# Patient Record
Sex: Male | Born: 1975 | Race: White | Hispanic: No | Marital: Married
Health system: Southern US, Community
[De-identification: ages and names within clinical notes are randomized; demographics above are authoritative.]

## PROBLEM LIST (undated history)

## (undated) DIAGNOSIS — T8859XA Other complications of anesthesia, initial encounter: Secondary | ICD-10-CM

## (undated) DIAGNOSIS — K56609 Unspecified intestinal obstruction, unspecified as to partial versus complete obstruction: Secondary | ICD-10-CM

## (undated) DIAGNOSIS — B2790 Infectious mononucleosis, unspecified without complication: Secondary | ICD-10-CM

## (undated) DIAGNOSIS — F1290 Cannabis use, unspecified, uncomplicated: Secondary | ICD-10-CM

## (undated) DIAGNOSIS — C8370 Burkitt lymphoma, unspecified site: Secondary | ICD-10-CM

## (undated) DIAGNOSIS — T4145XA Adverse effect of unspecified anesthetic, initial encounter: Secondary | ICD-10-CM

## (undated) HISTORY — PX: APPENDECTOMY: SHX54

## (undated) HISTORY — PX: TONSILLECTOMY: SUR1361

---

## 2011-11-11 DIAGNOSIS — B2790 Infectious mononucleosis, unspecified without complication: Secondary | ICD-10-CM

## 2011-11-11 HISTORY — DX: Infectious mononucleosis, unspecified without complication: B27.90

## 2012-04-20 ENCOUNTER — Ambulatory Visit (INDEPENDENT_AMBULATORY_CARE_PROVIDER_SITE_OTHER): Payer: Commercial Managed Care - PPO | Admitting: Internal Medicine

## 2012-04-20 ENCOUNTER — Encounter: Payer: Self-pay | Admitting: Internal Medicine

## 2012-04-20 VITALS — BP 126/74 | HR 60 | Temp 98.4°F | Ht 71.0 in | Wt 176.0 lb

## 2012-04-20 DIAGNOSIS — K5289 Other specified noninfective gastroenteritis and colitis: Secondary | ICD-10-CM

## 2012-04-20 DIAGNOSIS — R599 Enlarged lymph nodes, unspecified: Secondary | ICD-10-CM

## 2012-04-20 DIAGNOSIS — Z202 Contact with and (suspected) exposure to infections with a predominantly sexual mode of transmission: Secondary | ICD-10-CM

## 2012-04-20 DIAGNOSIS — R591 Generalized enlarged lymph nodes: Secondary | ICD-10-CM

## 2012-04-20 DIAGNOSIS — R21 Rash and other nonspecific skin eruption: Secondary | ICD-10-CM

## 2012-04-20 DIAGNOSIS — K529 Noninfective gastroenteritis and colitis, unspecified: Secondary | ICD-10-CM | POA: Insufficient documentation

## 2012-04-20 DIAGNOSIS — Z0000 Encounter for general adult medical examination without abnormal findings: Secondary | ICD-10-CM

## 2012-04-20 DIAGNOSIS — B09 Unspecified viral infection characterized by skin and mucous membrane lesions: Secondary | ICD-10-CM | POA: Insufficient documentation

## 2012-04-20 DIAGNOSIS — R109 Unspecified abdominal pain: Secondary | ICD-10-CM

## 2012-04-20 DIAGNOSIS — Z2089 Contact with and (suspected) exposure to other communicable diseases: Secondary | ICD-10-CM

## 2012-04-20 LAB — LIPID PANEL
LDL Cholesterol: 43 mg/dL (ref 0–99)
Total CHOL/HDL Ratio: 2
VLDL: 12.8 mg/dL (ref 0.0–40.0)

## 2012-04-20 LAB — CBC WITH DIFFERENTIAL/PLATELET
Basophils Absolute: 0 10*3/uL (ref 0.0–0.1)
Eosinophils Relative: 2.4 % (ref 0.0–5.0)
HCT: 41.8 % (ref 39.0–52.0)
Hemoglobin: 13.6 g/dL (ref 13.0–17.0)
Lymphocytes Relative: 33.8 % (ref 12.0–46.0)
Monocytes Relative: 7.5 % (ref 3.0–12.0)
Platelets: 172 10*3/uL (ref 150.0–400.0)
RDW: 12.9 % (ref 11.5–14.6)
WBC: 6.3 10*3/uL (ref 4.5–10.5)

## 2012-04-20 LAB — HEPATIC FUNCTION PANEL
ALT: 22 U/L (ref 0–53)
AST: 25 U/L (ref 0–37)
Alkaline Phosphatase: 60 U/L (ref 39–117)
Total Bilirubin: 0.3 mg/dL (ref 0.3–1.2)

## 2012-04-20 LAB — BASIC METABOLIC PANEL
Calcium: 8.7 mg/dL (ref 8.4–10.5)
GFR: 89.01 mL/min (ref >60.00)
Glucose, Bld: 102 mg/dL — ABNORMAL HIGH (ref 70–99)
Potassium: 4.1 mEq/L (ref 3.5–5.1)
Sodium: 142 mEq/L (ref 135–145)

## 2012-04-20 NOTE — Assessment & Plan Note (Signed)
Abdominal cramps secondary to probable viral gastroenteritis.  Symptomatic treatment.  Increase fluids.  Check CBCD and LFTs. Patient advised to call office if symptoms persist or worsen.

## 2012-04-20 NOTE — Assessment & Plan Note (Signed)
Patient has stinging sensation and resolving rash on right buttock area. Symptoms may be consistent with herpes simplex virus infection. I doubt his symptoms are secondary to shingles. He declines empiric reatment with Valtrex. Obtain HSV-2 antibodies.

## 2012-04-20 NOTE — Patient Instructions (Signed)
Please call our office if you symptoms do not improve or get worse.

## 2012-04-20 NOTE — Assessment & Plan Note (Signed)
Patient has scattered lymphadenopathy in inguinal , left axillary and right cervical areas. Nodes are nontender.  Likely reactive. Monitor for now. Obtain CBCD.

## 2012-04-20 NOTE — Progress Notes (Signed)
Subjective:    Patient ID: Donald [name], male    DOB: [birth year], 36 y.o.   MRN: [id number]  HPI  35 year old white male here to establish. Patient has several complaints. Patient reports several coworkers recently contracted a viral gastroenteritis with vomiting. The last 2 weeks he complains of intermittent abdominal cramps. It can be located in right upper quadrant or left upper quadrant. He denies diarrhea or vomiting. He denies fever or chills.  His bowel movements are normal.  He also complains of a stinging sensation in his left upper buttock. The skin was very tender to touch. He describes what look like a bruise on his buttock. Area was hot, pruritic and painful. He denies any related genitourinary symptoms. No dysuria or urinary frequency. He denies any history of STDs.  His symptoms lasted 2-3 weeks.  Burning sensation is improving.  Patient is originally from [place].  He moved to [place] 13 yrs ago.   He is married and has 3 children ages 1, 18 and 2. 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 He stays very physically active.  He is involved with a bicycle road races.  Patient mentions episode of blocked ejaculatory duct approximately 3 years ago. He was seen by urologists and thought to have prostatitis and epididymitis. He recommended possible surgery which he declined. He has been self treating with plant sterols.  He took one month of antibiotics. His symptoms resolved. He reports noticing small inguinal lymph nodes bilaterally.  Review of Systems  Constitutional: Negative for activity change, appetite change and unexpected weight change.  Eyes: Negative for visual disturbance.  Respiratory: Negative for cough, chest tightness and shortness of breath.   Cardiovascular: Negative for chest pain.  Genitourinary: Negative for difficulty urinating.  Neurological: Negative for headaches.  Gastrointestinal: Negative for abdominal  pain, heartburn melena or hematochezia Psych: Negative for depression or anxiety  History reviewed. No pertinent past medical history.  History   Social History  . Marital Status: Married    Spouse Name: N/A    Number of Children: N/A  . Years of Education: N/A   Occupational History  . Not on file.   Social History Main Topics  . Smoking status: Never Smoker   . Smokeless tobacco: Not on file  . Alcohol Use: No  . Drug Use: Not on file  . Sexually Active: Not on file   Other Topics Concern  . Not on file   Social History Narrative  . No narrative on file    Past Surgical History  Procedure Date  . Tonsillectomy   . Appendectomy     Family History  Problem Relation Age of Onset  . Heart failure Father     No Known Allergies  No current outpatient prescriptions on file prior to visit.    BP 126/74  Pulse 60  Temp(Src) 98.4 F (36.9 C) (Oral)  Ht 5\' 11"  (1.803 m)  Wt 176 lb (79.833 kg)  BMI 24.55 kg/m2        Objective:   Physical Exam  Constitutional: He is oriented to person, place, and time. He appears well-developed and well-nourished. No distress.  HENT:  Head: Normocephalic and atraumatic.  Right Ear: External ear normal.  Left Ear: External ear normal.  Mouth/Throat: Oropharynx is clear and moist.  Eyes: Conjunctivae and EOM are normal. Pupils are equal, round, and reactive to light. No scleral icterus.  Neck: Normal range of motion. Neck supple.  Subcentimeter right posterior cervical chain lymph node that is slightly firm and nontender  Cardiovascular: Normal rate, regular rhythm and normal heart sounds.   No murmur heard. Pulmonary/Chest: Effort normal and breath sounds normal. He has no wheezes.  Abdominal: Soft. Bowel sounds are normal. He exhibits no mass. There is no tenderness.  Genitourinary: Penis normal.       No testicular mass, mild left epididymal tenderness  Musculoskeletal: Normal range of motion. He exhibits no  edema.  Neurological: He is alert and oriented to person, place, and time. No cranial nerve deficit.  Skin: Skin is warm.       Patch of slightly hyperpigmented irregular area on right buttock, nontender, no erythema  Psychiatric: He has a normal mood and affect. His behavior is normal.       Assessment & Plan:

## 2012-04-21 LAB — HIV ANTIBODY (ROUTINE TESTING W REFLEX): HIV: NONREACTIVE

## 2012-04-21 LAB — HSV 2 ANTIBODY, IGG: HSV 2 Glycoprotein G Ab, IgG: 0.14 IV

## 2012-04-22 NOTE — Progress Notes (Signed)
Copy of labs are out front for pt

## 2012-06-01 ENCOUNTER — Ambulatory Visit (INDEPENDENT_AMBULATORY_CARE_PROVIDER_SITE_OTHER): Payer: Commercial Managed Care - PPO | Admitting: Internal Medicine

## 2012-06-01 ENCOUNTER — Encounter: Payer: Self-pay | Admitting: Internal Medicine

## 2012-06-01 VITALS — BP 122/72 | Temp 98.1°F | Wt 176.0 lb

## 2012-06-01 DIAGNOSIS — B2790 Infectious mononucleosis, unspecified without complication: Secondary | ICD-10-CM

## 2012-06-01 DIAGNOSIS — R599 Enlarged lymph nodes, unspecified: Secondary | ICD-10-CM

## 2012-06-01 DIAGNOSIS — R591 Generalized enlarged lymph nodes: Secondary | ICD-10-CM

## 2012-06-01 NOTE — Progress Notes (Signed)
  Subjective:    Patient ID: Donald [name], male    DOB: [birth year], 36 y.o.   MRN: [id number]  HPI  36 year old white male previously seen in June, 2013 for nonspecific gastroenteritis symptoms and possible herpes zoster in his buttocks for followup. Patient reports over the last several weeks he has experienced chronic fatigue. He feels his glands in his neck are swollen and he has low-grade fever. He typically trains for competitive bicycle races. He has been unable to keep his normal training schedule due to fatigue symptoms/lethargy.    Review of Systems Negative for joint pain,  denies history of tick bites  No past medical history on file.  History   Social History  . Marital Status: Married    Spouse Name: N/A    Number of Children: N/A  . Years of Education: N/A   Occupational History  . Not on file.   Social History Main Topics  . Smoking status: Never Smoker   . Smokeless tobacco: Not on file  . Alcohol Use: No  . Drug Use: Not on file  . Sexually Active: Not on file   Other Topics Concern  . Not on file   Social History Narrative  . No narrative on file    Past Surgical History  Procedure Date  . Tonsillectomy   . Appendectomy     Family History  Problem Relation Age of Onset  . Heart failure Father     No Known Allergies  No current outpatient prescriptions on file prior to visit.    BP 122/72  Temp 98.1 F (36.7 C) (Oral)  Wt 176 lb (79.833 kg)       Objective:   Physical Exam  Constitutional: He is oriented to person, place, and time. He appears well-developed and well-nourished.  HENT:  Head: Normocephalic and atraumatic.  Right Ear: External ear normal.  Mouth/Throat: Oropharynx is clear and moist.  Eyes: EOM are normal. Pupils are equal, round, and reactive to light. No scleral icterus.  Neck: Neck supple.       Shotty subcentimeter lymph nodes anterior cervical chain and posterior total chain bilaterally  Cardiovascular: Normal  rate, regular rhythm and normal heart sounds.   Pulmonary/Chest: Effort normal and breath sounds normal.  Abdominal: Soft. Bowel sounds are normal.       No hepatosplenomegaly  Musculoskeletal: He exhibits no edema.  Neurological: He is alert and oriented to person, place, and time.  Skin: Skin is warm and dry. No rash noted.  Psychiatric: He has a normal mood and affect. His behavior is normal.          Assessment & Plan:

## 2012-06-01 NOTE — Assessment & Plan Note (Signed)
Patient complains of chronic fatigue / lethargy and swollen glands in his neck.  Previous CBCD and HIV antibody was negative.  Check EBV and CMV titers.  Low risk for Lymes disease.

## 2012-06-01 NOTE — Patient Instructions (Addendum)
Consider using the following supplements - Astragalus, [facility] We will contact you re: blood test results

## 2012-06-02 LAB — EPSTEIN-BARR VIRUS VCA ANTIBODY PANEL: EBV VCA IgG: 708 U/mL — ABNORMAL HIGH (ref <18.0)

## 2012-06-03 NOTE — Progress Notes (Signed)
Quick Note:  Spoke with pt - informed of lab results copy ready for pick up as requested ______

## 2012-09-23 ENCOUNTER — Encounter: Payer: Self-pay | Admitting: Internal Medicine

## 2012-09-23 ENCOUNTER — Ambulatory Visit (INDEPENDENT_AMBULATORY_CARE_PROVIDER_SITE_OTHER): Payer: Commercial Managed Care - PPO | Admitting: Internal Medicine

## 2012-09-23 VITALS — BP 132/80 | HR 60 | Temp 98.2°F | Wt 187.0 lb

## 2012-09-23 DIAGNOSIS — R5381 Other malaise: Secondary | ICD-10-CM

## 2012-09-23 DIAGNOSIS — B2790 Infectious mononucleosis, unspecified without complication: Secondary | ICD-10-CM

## 2012-09-23 DIAGNOSIS — R5382 Chronic fatigue, unspecified: Secondary | ICD-10-CM

## 2012-09-23 DIAGNOSIS — R5383 Other fatigue: Secondary | ICD-10-CM

## 2012-09-23 DIAGNOSIS — G9332 Myalgic encephalomyelitis/chronic fatigue syndrome: Secondary | ICD-10-CM

## 2012-09-23 LAB — HEPATIC FUNCTION PANEL
ALT: 24 U/L (ref 0–53)
Albumin: 4.1 g/dL (ref 3.5–5.2)
Alkaline Phosphatase: 58 U/L (ref 39–117)
Total Protein: 7 g/dL (ref 6.0–8.3)

## 2012-09-23 LAB — CBC WITH DIFFERENTIAL/PLATELET
Eosinophils Absolute: 0.1 10*3/uL (ref 0.0–0.7)
Eosinophils Relative: 1.9 % (ref 0.0–5.0)
HCT: 42.2 % (ref 39.0–52.0)
Lymphs Abs: 1.5 10*3/uL (ref 0.7–4.0)
MCHC: 33.2 g/dL (ref 30.0–36.0)
MCV: 89.2 fl (ref 78.0–100.0)
Monocytes Absolute: 0.7 10*3/uL (ref 0.1–1.0)
Platelets: 172 10*3/uL (ref 150.0–400.0)
RDW: 12.4 % (ref 11.5–14.6)
WBC: 5.7 10*3/uL (ref 4.5–10.5)

## 2012-09-23 LAB — BASIC METABOLIC PANEL
BUN: 18 mg/dL (ref 6–23)
CO2: 29 mEq/L (ref 19–32)
Chloride: 101 mEq/L (ref 96–112)
Glucose, Bld: 83 mg/dL (ref 70–99)
Potassium: 3.6 mEq/L (ref 3.5–5.1)

## 2012-09-23 NOTE — Patient Instructions (Addendum)
Our office will contact you re: blood test results 

## 2012-09-23 NOTE — Assessment & Plan Note (Addendum)
36 year old white male with signs and symptoms of chronic fatigue syndrome. His symptoms may be related to possible Epstein barr infection that he contracted in June of 2013. Repeat Epstein-Barr virus titers. Patient defers use of valacyclovir or famvir. He would like to try natural herbal remedies. Check CBCD, LFTs and thyroid function tests.  Patient advised to avoid strenuous activities since it seems to exacerbate his symptoms.  Hopefully, his symptoms will resolve within next 6-12 months.

## 2012-09-23 NOTE — Progress Notes (Signed)
  Subjective:    Patient ID: Donald [name], male    DOB: December 26, 1975, 36 y.o.   MRN: [id number]  HPI  36 year old white male previously seen for possible mononucleosis syndrome for followup. Patient reports he has gotten slightly better since past summer. He has been able to return to working out but on a  limited basis. He frequently complains of post exertional fatigue. He also complains of mental fog. He gets intermittent headaches and feels irritable.  In the afternoon patient reports "I feel like I hit by a truck".  He had issues with transient small red patches underneath his armpits. He used a natural remedy-oregano oil with resolution of symptoms.   Review of Systems No fever or chills,  No change in weight  No past medical history on file.  History   Social History  . Marital Status: Married    Spouse Name: N/A    Number of Children: N/A  . Years of Education: N/A   Occupational History  . Not on file.   Social History Main Topics  . Smoking status: Never Smoker   . Smokeless tobacco: Not on file  . Alcohol Use: No  . Drug Use: Not on file  . Sexually Active: Not on file   Other Topics Concern  . Not on file   Social History Narrative  . No narrative on file    Past Surgical History  Procedure Date  . Tonsillectomy   . Appendectomy     Family History  Problem Relation Age of Onset  . Heart failure Father     No Known Allergies  No current outpatient prescriptions on file prior to visit.    BP 132/80  Pulse 60  Temp 98.2 F (36.8 C) (Oral)  Wt 187 lb (84.823 kg)       Objective:   Physical Exam  Constitutional: He is oriented to person, place, and time. He appears well-developed and well-nourished.  Cardiovascular: Normal rate, regular rhythm and normal heart sounds.   No murmur heard. Pulmonary/Chest: Effort normal and breath sounds normal. He has no wheezes.  Abdominal: Soft. Bowel sounds are normal.       No hepatosplenomegaly    Musculoskeletal: He exhibits no edema.  Lymphadenopathy:    He has cervical adenopathy.    He has no axillary adenopathy.       Right: No inguinal adenopathy present.       Left: No inguinal adenopathy present.  Neurological: He is alert and oriented to person, place, and time. No cranial nerve deficit.  Skin: Skin is warm and dry. No rash noted.  Psychiatric: He has a normal mood and affect. His behavior is normal.          Assessment & Plan:

## 2012-09-24 LAB — EPSTEIN-BARR VIRUS VCA ANTIBODY PANEL: EBV VCA IgG: 721 U/mL — ABNORMAL HIGH (ref <18.0)

## 2013-01-22 ENCOUNTER — Emergency Department (HOSPITAL_COMMUNITY)
Admission: EM | Admit: 2013-01-22 | Discharge: 2013-01-22 | Disposition: A | Discharge status: Home / Self Care | Payer: Commercial Managed Care - PPO | Attending: Emergency Medicine | Admitting: Emergency Medicine

## 2013-01-22 ENCOUNTER — Encounter (HOSPITAL_COMMUNITY): Payer: Self-pay | Admitting: *Deleted

## 2013-01-22 ENCOUNTER — Other Ambulatory Visit (HOSPITAL_COMMUNITY): Payer: Commercial Managed Care - PPO

## 2013-01-22 DIAGNOSIS — R61 Generalized hyperhidrosis: Secondary | ICD-10-CM | POA: Insufficient documentation

## 2013-01-22 DIAGNOSIS — N390 Urinary tract infection, site not specified: Secondary | ICD-10-CM | POA: Insufficient documentation

## 2013-01-22 DIAGNOSIS — R300 Dysuria: Secondary | ICD-10-CM | POA: Insufficient documentation

## 2013-01-22 DIAGNOSIS — R319 Hematuria, unspecified: Secondary | ICD-10-CM | POA: Insufficient documentation

## 2013-01-22 DIAGNOSIS — R3915 Urgency of urination: Secondary | ICD-10-CM | POA: Insufficient documentation

## 2013-01-22 DIAGNOSIS — R109 Unspecified abdominal pain: Secondary | ICD-10-CM | POA: Insufficient documentation

## 2013-01-22 DIAGNOSIS — M549 Dorsalgia, unspecified: Secondary | ICD-10-CM | POA: Insufficient documentation

## 2013-01-22 LAB — URINALYSIS, ROUTINE W REFLEX MICROSCOPIC
Glucose, UA: NEGATIVE mg/dL
Ketones, ur: 15 mg/dL — AB
Specific Gravity, Urine: 1.03 (ref 1.005–1.030)
pH: 6 (ref 5.0–8.0)

## 2013-01-22 LAB — URINE MICROSCOPIC-ADD ON

## 2013-01-22 LAB — CBC WITH DIFFERENTIAL/PLATELET
Basophils Relative: 0 % (ref 0–1)
Eosinophils Absolute: 0.3 10*3/uL (ref 0.0–0.7)
Hemoglobin: 15 g/dL (ref 13.0–17.0)
Lymphocytes Relative: 26 % (ref 12–46)
Lymphs Abs: 2.4 10*3/uL (ref 0.7–4.0)
MCH: 29.4 pg (ref 26.0–34.0)
MCHC: 33.9 g/dL (ref 30.0–36.0)
Monocytes Relative: 7 % (ref 3–12)
Neutrophils Relative %: 64 % (ref 43–77)
Platelets: 184 10*3/uL (ref 150–400)
RBC: 5.11 MIL/uL (ref 4.22–5.81)
WBC: 9.4 10*3/uL (ref 4.0–10.5)

## 2013-01-22 MED ORDER — CEPHALEXIN 500 MG PO CAPS: 500.0000 mg | ORAL_CAPSULE | Freq: Four times a day (QID) | ORAL | Status: DC

## 2013-01-22 MED ORDER — OXYCODONE-ACETAMINOPHEN 5-325 MG PO TABS
1.0000 | ORAL_TABLET | Freq: Once | ORAL | Status: AC
  Administered 2013-01-22: 1 via ORAL
  Filled 2013-01-22: qty 1

## 2013-01-22 MED ORDER — ONDANSETRON 4 MG PO TBDP
8.0000 mg | ORAL_TABLET | Freq: Once | ORAL | Status: AC
  Administered 2013-01-22: 8 mg via ORAL

## 2013-01-22 MED ORDER — CEPHALEXIN 250 MG PO CAPS
500.0000 mg | ORAL_CAPSULE | Freq: Once | ORAL | Status: AC
  Administered 2013-01-22: 500 mg via ORAL
  Filled 2013-01-22: qty 2

## 2013-01-22 MED ORDER — ONDANSETRON 4 MG PO TBDP
ORAL_TABLET | ORAL | Status: AC
  Filled 2013-01-22: qty 2

## 2013-01-22 MED ORDER — IBUPROFEN 200 MG PO TABS
400.0000 mg | ORAL_TABLET | Freq: Once | ORAL | Status: AC
  Administered 2013-01-22: 400 mg via ORAL
  Filled 2013-01-22: qty 2

## 2013-01-22 NOTE — ED Notes (Signed)
Per Denny Peon, transport team member, patient is refusing CT exam at this time, and requesting to speak with the doctor.  RN notified also.

## 2013-01-22 NOTE — ED Notes (Signed)
Patient transported to CT 

## 2013-01-22 NOTE — ED Provider Notes (Signed)
History     CSN: [id number]  Arrival date & time 01/22/13  0213   First MD Initiated Contact with Patient 01/22/13 [phone]      Chief Complaint  Patient presents with  . Dysuria  . Flank Pain  . Hematuria    (Consider location/radiation/quality/duration/timing/severity/associated sxs/prior treatment) HPI 37 yo man presents with GU sx. Last night before bed developed urinary urgency. Then awoke with dysuria and gross hematuria. Reports mild, aching bilateral mid back pain. Denies fever, N/V. Denies abdominal pain. Reports episode of diaphoresis which lasted a few minutes at home.   Took percocet 5mg  x 1 pta and had another one here in ED. Pain resolved. Pain was aching, nonradiating moderately severe.   History reviewed. No pertinent past medical history.  Past Surgical History  Procedure Laterality Date  . Tonsillectomy    . Appendectomy      Family History  Problem Relation Age of Onset  . Heart failure Father     History  Substance Use Topics  . Smoking status: Never Smoker   . Smokeless tobacco: Not on file  . Alcohol Use: No      Review of Systems Gen: no weight loss, fevers, chills, night sweats Eyes: no discharge or drainage, no occular pain or visual changes Nose: no epistaxis or rhinorrhea Mouth: no dental pain, no sore throat Neck: no neck pain Lungs: no SOB, cough, wheezing CV: no chest pain, palpitations, dependent edema or orthopnea Abd: no abdominal pain, nausea, vomiting GU: As per history of present illness, otherwise negative MSK: no myalgias or arthralgias Neuro: no headache, no focal neurologic deficits Skin: no rash Psyche: negative.  Allergies  Review of patient's allergies indicates no known allergies.  Home Medications  No current outpatient prescriptions on file.  BP 132/79  Pulse 50  Temp(Src) 97.6 F (36.4 C) (Oral)  Resp 16  SpO2 100%  Physical Exam Gen: well developed and well nourished appearing Head: NCAT Eyes: PERL,  EOMI Nose: no epistaixis or rhinorrhea Mouth/throat: mucosa is moist and pink Neck: supple, no stridor Lungs: CTA B, no wheezing, rhonchi or rales Abd: soft, notender, nondistended Back: no ttp, no cva ttp Skin: no rashese, wnl Neuro: CN ii-xii grossly intact, no focal deficits Psyche; normal affect,  calm and cooperative.   ED Course  Procedures (including critical care time)  Results for orders placed during the hospital encounter of 01/22/13 (from the past 24 hour(s))  CBC WITH DIFFERENTIAL     Status: None   Collection Time    01/22/13  2:32 AM      Result Value Range   WBC 9.4  4.0 - 10.5 K/uL   RBC 5.11  4.22 - 5.81 MIL/uL   Hemoglobin 15.0  13.0 - 17.0 g/dL   HCT 40.1  02.7 - 25.3 %   MCV 86.5  78.0 - 100.0 fL   MCH 29.4  26.0 - 34.0 pg   MCHC 33.9  30.0 - 36.0 g/dL   RDW 66.4  40.3 - 47.4 %   Platelets 184  150 - 400 K/uL   Neutrophils Relative 64  43 - 77 %   Neutro Abs 6.0  1.7 - 7.7 K/uL   Lymphocytes Relative 26  12 - 46 %   Lymphs Abs 2.4  0.7 - 4.0 K/uL   Monocytes Relative 7  3 - 12 %   Monocytes Absolute 0.6  0.1 - 1.0 K/uL   Eosinophils Relative 4  0 - 5 %   Eosinophils Absolute  0.3  0.0 - 0.7 K/uL   Basophils Relative 0  0 - 1 %   Basophils Absolute 0.0  0.0 - 0.1 K/uL  URINALYSIS, ROUTINE W REFLEX MICROSCOPIC     Status: Abnormal   Collection Time    01/22/13  2:38 AM      Result Value Range   Color, Urine RED (*) YELLOW   APPearance TURBID (*) CLEAR   Specific Gravity, Urine 1.030  1.005 - 1.030   pH 6.0  5.0 - 8.0   Glucose, UA NEGATIVE  NEGATIVE mg/dL   Hgb urine dipstick LARGE (*) NEGATIVE   Bilirubin Urine MODERATE (*) NEGATIVE   Ketones, ur 15 (*) NEGATIVE mg/dL   Protein, ur >409 (*) NEGATIVE mg/dL   Urobilinogen, UA 1.0  0.0 - 1.0 mg/dL   Nitrite NEGATIVE  NEGATIVE   Leukocytes, UA MODERATE (*) NEGATIVE  URINE MICROSCOPIC-ADD ON     Status: None   Collection Time    01/22/13  2:38 AM      Result Value Range   Squamous Epithelial /  LPF RARE  RARE   WBC, UA 7-10  <3 WBC/hpf   RBC / HPF TOO NUMEROUS TO COUNT  <3 RBC/hpf   Bacteria, UA RARE  RARE     MDM  Ddx: inflammatory cystitis, UTI, ureteral calculus, tumor  Hematuria resolved without intervention in the ED. Patient appears to have mild UTI. Urine to culture. Empiric tx with Keflex. Patient advised to return for red flag sx and agrees to f/u with PCP on Monday.         Brandt Loosen, MD 01/22/13 [phone]

## 2013-01-22 NOTE — ED Notes (Addendum)
C/o bilateral flank (mild), hematuria (pink & passed clots), dysuria (burning). Denies dehydration. Drinking lots of water. Reports decreased urine flow, only a little at a time. Onset 2000. No h/o kidney stones. Took 1 percocet PTA. intermitant nausea, was diaphoretic PTA. (Denies: abd pain, testicle or groin pain). Alert, NAD, calm, interactive.

## 2013-01-24 LAB — POCT I-STAT, CHEM 8
BUN: 16 mg/dL (ref 6–23)
Calcium, Ion: 1.16 mmol/L (ref 1.12–1.23)
Chloride: 101 mEq/L (ref 96–112)
Creatinine, Ser: 1 mg/dL (ref 0.50–1.35)
Glucose, Bld: 95 mg/dL (ref 70–99)
HCT: 46 % (ref 39.0–52.0)
Potassium: 3.8 mEq/L (ref 3.5–5.1)

## 2013-01-24 LAB — URINE CULTURE: Culture: NO GROWTH

## 2013-04-01 ENCOUNTER — Encounter (HOSPITAL_COMMUNITY): Payer: Self-pay | Admitting: *Deleted

## 2013-04-01 ENCOUNTER — Emergency Department (HOSPITAL_COMMUNITY)
Admission: EM | Admit: 2013-04-01 | Discharge: 2013-04-01 | Disposition: A | Discharge status: Home / Self Care | Payer: Commercial Managed Care - PPO | Attending: Emergency Medicine | Admitting: Emergency Medicine

## 2013-04-01 DIAGNOSIS — IMO0002 Reserved for concepts with insufficient information to code with codable children: Secondary | ICD-10-CM | POA: Insufficient documentation

## 2013-04-01 DIAGNOSIS — H547 Unspecified visual loss: Secondary | ICD-10-CM | POA: Insufficient documentation

## 2013-04-01 DIAGNOSIS — W010XXA Fall on same level from slipping, tripping and stumbling without subsequent striking against object, initial encounter: Secondary | ICD-10-CM | POA: Insufficient documentation

## 2013-04-01 DIAGNOSIS — S058X9A Other injuries of unspecified eye and orbit, initial encounter: Secondary | ICD-10-CM | POA: Insufficient documentation

## 2013-04-01 DIAGNOSIS — Y929 Unspecified place or not applicable: Secondary | ICD-10-CM | POA: Insufficient documentation

## 2013-04-01 DIAGNOSIS — T1590XA Foreign body on external eye, part unspecified, unspecified eye, initial encounter: Secondary | ICD-10-CM | POA: Insufficient documentation

## 2013-04-01 DIAGNOSIS — Y998 Other external cause status: Secondary | ICD-10-CM | POA: Insufficient documentation

## 2013-04-01 DIAGNOSIS — S0501XA Injury of conjunctiva and corneal abrasion without foreign body, right eye, initial encounter: Secondary | ICD-10-CM

## 2013-04-01 MED ORDER — ERYTHROMYCIN 5 MG/GM OP OINT
TOPICAL_OINTMENT | Freq: Once | OPHTHALMIC | Status: AC
  Administered 2013-04-01: 1 via OPHTHALMIC
  Filled 2013-04-01: qty 1

## 2013-04-01 MED ORDER — TETRACAINE HCL 0.5 % OP SOLN
2.0000 [drp] | Freq: Once | OPHTHALMIC | Status: AC
  Administered 2013-04-01: 2 [drp] via OPHTHALMIC

## 2013-04-01 MED ORDER — FLUORESCEIN SODIUM 1 MG OP STRP
2.0000 | ORAL_STRIP | Freq: Once | OPHTHALMIC | Status: DC
  Filled 2013-04-01: qty 2

## 2013-04-01 MED ORDER — HYDROMORPHONE HCL PF 1 MG/ML IJ SOLN
1.0000 mg | Freq: Once | INTRAMUSCULAR | Status: AC
  Administered 2013-04-01: 1 mg via INTRAMUSCULAR
  Filled 2013-04-01: qty 1

## 2013-04-01 MED ORDER — OXYCODONE-ACETAMINOPHEN 5-325 MG PO TABS: ORAL_TABLET | ORAL | Status: DC

## 2013-04-01 MED ORDER — ONDANSETRON 4 MG PO TBDP
8.0000 mg | ORAL_TABLET | Freq: Once | ORAL | Status: AC
  Administered 2013-04-01: 8 mg via ORAL
  Filled 2013-04-01: qty 2

## 2013-04-01 NOTE — ED Notes (Signed)
Pt restless/ writhing with eye pain from bleach in eye, R>L, wife at side.

## 2013-04-01 NOTE — ED Notes (Signed)
Pt reports that he got clorox with bleach in both eyes when working in garage. States that initially he wasn't able to see and had blurred vision. Denying at this time. MD Fonnie Jarvis and PA Joni Reining at bedside. Lequita Halt lense started which pt is tolerating at this time. Family at bedside. AO x4.

## 2013-04-01 NOTE — ED Provider Notes (Signed)
History    This chart was scribed for a non-physician practitioner, Wynetta Emery, working with Hurman Horn, MD by Frederik Pear, ED Scribe. This patient was seen in room TR04C/TR04C and the patient's care was started at 1926.   CSN: [id number]  Arrival date & time 04/01/13  7829   First MD Initiated Contact with Patient 04/01/13 1926      No chief complaint on file.   (Consider location/radiation/quality/duration/timing/severity/associated sxs/prior treatment) The history is provided by the patient and medical records. No language interpreter was used.   HPI Comments: Donald [name] is a 37 y.o. male who presents to the Emergency Department complaining of sudden onset, constant eye injury that began at 1900 when he slipped in water while carrying a container of undiluted Clorox Cleanup with bleach that went into his bilateral eyes and up his nose. He reports that his left eye feel baseline, but complains of severe pain to his right eye. He irrigated both eyes with tap and distilled water at home before coming to the ED. Pt reports a severe 10/10 pain in and needing in all in right eye, and reduced visual acuity.  As per his wife he patient initially had a white hazy cornea prior to irrigation at home. Patient does not wear contact lenses or use glasses. He states that the vision in his right eye is worse than his left at baseline.   No past medical history on file.  Past Surgical History  Procedure Laterality Date  . Tonsillectomy    . Appendectomy      Family History  Problem Relation Age of Onset  . Heart failure Father     History  Substance Use Topics  . Smoking status: Never Smoker   . Smokeless tobacco: Not on file  . Alcohol Use: No      Review of Systems  Constitutional: Negative for fever.  Eyes: Positive for pain and visual disturbance.  Respiratory: Negative for shortness of breath.   Cardiovascular: Negative for chest pain.  Gastrointestinal: Negative for  nausea, vomiting, abdominal pain and diarrhea.  All other systems reviewed and are negative.    Allergies  Review of patient's allergies indicates no known allergies.  Home Medications   Current Outpatient Rx  Name  Route  Sig  Dispense  Refill  . oxyCODONE-acetaminophen (PERCOCET/ROXICET) 5-325 MG per tablet      1 to 2 tabs PO q6hrs  PRN for pain   15 tablet   0     BP 141/75  Pulse 54  Temp(Src) 97.6 F (36.4 C) (Oral)  Resp 16  SpO2 100%  Physical Exam  Nursing note and vitals reviewed. Constitutional: He is oriented to person, place, and time. He appears well-developed and well-nourished. No distress.  HENT:  Head: Normocephalic.  Eyes: Conjunctivae and EOM are normal.    Corneal abrasion to area of right iris, surface layer is   Cardiovascular: Normal rate.   Pulmonary/Chest: Effort normal. No stridor.  Musculoskeletal: Normal range of motion.  Neurological: He is alert and oriented to person, place, and time.  Psychiatric: He has a normal mood and affect.    ED Course  Procedures (including critical care time)  COORDINATION OF CARE:  19:35- Discussed planned course of treatment with the patient, including irrigating his bilateral eyes, Zofran, and Dilaudid, who is agreeable at this time.  19:45- Medication Orders- tetracaine (pontocaine) 0.5% ophthalmic solution 2 drop- once, hydromorphone (dilaudid) injection 1 mg- once, tetracaine (pontocaine) 0.5% ophthalmic solution 2 drop-  once, ondansetron (zofran-odt) disintegrating tablet 8 mg- once.  Labs Reviewed - No data to display No results found.  Medications  tetracaine (PONTOCAINE) 0.5 % ophthalmic solution 2 drop (2 drops Both Eyes Given 04/01/13 1920)  HYDROmorphone (DILAUDID) injection 1 mg (1 mg Intramuscular Given 04/01/13 1943)  tetracaine (PONTOCAINE) 0.5 % ophthalmic solution 2 drop (2 drops Right Eye Given 04/01/13 1939)  ondansetron (ZOFRAN-ODT) disintegrating tablet 8 mg (8 mg Oral Given  04/01/13 1945)  erythromycin ophthalmic ointment (1 application Right Eye Given 04/01/13 2124)     1. Right corneal abrasion, initial encounter       MDM   Donald [name] is a 37 y.o. male  with undiluted home bleach product in both eyes. Right eye is significantly more painful than left. Right eye acuity is 20/40 and initial pH is 8.0. Right eye is irrigated lens liter of normal saline. Patient refused irrigation of left eye. PH after irrigation is slightly reduced to about 7.5. Slit lamp exam shows large corneal abrasion covering the right pupil with no other abnormalities in the left eye.   Patient will be given erythromycin ointment and pain medication and asked to followup with an ophthalmologist. I have extensively discussed return precautions with both him and his wife have encouraged him to have a low threshold to return to the ED with any new, worsening or concerning symptom.   This is a shared visit with the attending physician who personally evaluated the patient and agrees with the care plan.    Filed Vitals:   04/01/13 2116  BP: 141/75  Pulse: 54  Temp: 97.6 F (36.4 C)  TempSrc: Oral  Resp: 16  SpO2: 100%     Pt verbalized understanding and agrees with care plan. Outpatient follow-up and return precautions given.    Discharge Medication List as of 04/01/2013  9:19 PM    START taking these medications   Details  oxyCODONE-acetaminophen (PERCOCET/ROXICET) 5-325 MG per tablet 1 to 2 tabs PO q6hrs  PRN for pain, Print        I personally performed the services described in this documentation, which was scribed in my presence. The recorded information has been reviewed and is accurate.          Wynetta Emery, PA-C 04/02/13 [phone]

## 2013-04-01 NOTE — ED Notes (Signed)
Pt finished 1000 mL saline bag to right eye. Still unable to open right eye due to pain. Reports that he doesn't have any pain to left eye, denies blurred vision or irritation.

## 2013-04-01 NOTE — ED Notes (Signed)
Bleach in bilateral eye

## 2013-04-04 NOTE — ED Provider Notes (Signed)
Medical screening examination/treatment/procedure(s) were performed by non-physician practitioner and as supervising physician I was immediately available for consultation/collaboration.   Hurman Horn, MD 04/04/13 [phone]

## 2013-09-15 ENCOUNTER — Other Ambulatory Visit: Payer: Self-pay

## 2014-03-02 ENCOUNTER — Encounter: Payer: Self-pay | Admitting: Physician Assistant

## 2014-03-02 ENCOUNTER — Ambulatory Visit: Payer: Commercial Managed Care - PPO | Admitting: Physician Assistant

## 2014-11-13 ENCOUNTER — Ambulatory Visit: Payer: Commercial Managed Care - PPO | Admitting: Internal Medicine

## 2015-07-12 ENCOUNTER — Ambulatory Visit (INDEPENDENT_AMBULATORY_CARE_PROVIDER_SITE_OTHER)
Admission: RE | Admit: 2015-07-12 | Discharge: 2015-07-12 | Disposition: A | Discharge status: Home / Self Care | Payer: BLUE CROSS/BLUE SHIELD | Source: Ambulatory Visit | Attending: Family Medicine | Admitting: Family Medicine

## 2015-07-12 ENCOUNTER — Encounter: Payer: Self-pay | Admitting: Family Medicine

## 2015-07-12 ENCOUNTER — Ambulatory Visit (INDEPENDENT_AMBULATORY_CARE_PROVIDER_SITE_OTHER): Payer: BLUE CROSS/BLUE SHIELD | Admitting: Family Medicine

## 2015-07-12 VITALS — BP 130/70 | HR 74 | Temp 98.6°F | Wt 207.0 lb

## 2015-07-12 DIAGNOSIS — R05 Cough: Secondary | ICD-10-CM

## 2015-07-12 DIAGNOSIS — R059 Cough, unspecified: Secondary | ICD-10-CM

## 2015-07-12 DIAGNOSIS — R202 Paresthesia of skin: Secondary | ICD-10-CM

## 2015-07-12 DIAGNOSIS — M546 Pain in thoracic spine: Secondary | ICD-10-CM | POA: Diagnosis not present

## 2015-07-12 LAB — CBC WITH DIFFERENTIAL/PLATELET
BASOS PCT: 0.9 % (ref 0.0–3.0)
Basophils Absolute: 0.1 10*3/uL (ref 0.0–0.1)
EOS PCT: 3.5 % (ref 0.0–5.0)
Eosinophils Absolute: 0.2 10*3/uL (ref 0.0–0.7)
HCT: 45 % (ref 39.0–52.0)
Hemoglobin: 15.3 g/dL (ref 13.0–17.0)
LYMPHS ABS: 1.7 10*3/uL (ref 0.7–4.0)
Lymphocytes Relative: 24.7 % (ref 12.0–46.0)
MCHC: 34.1 g/dL (ref 30.0–36.0)
MCV: 87.6 fl (ref 78.0–100.0)
MONO ABS: 0.6 10*3/uL (ref 0.1–1.0)
Monocytes Relative: 8.6 % (ref 3.0–12.0)
NEUTROS ABS: 4.4 10*3/uL (ref 1.4–7.7)
NEUTROS PCT: 62.3 % (ref 43.0–77.0)
Platelets: 220 10*3/uL (ref 150.0–400.0)
RBC: 5.13 Mil/uL (ref 4.22–5.81)
RDW: 13 % (ref 11.5–15.5)
WBC: 7 10*3/uL (ref 4.0–10.5)

## 2015-07-12 LAB — HEPATIC FUNCTION PANEL
ALK PHOS: 57 U/L (ref 39–117)
ALT: 32 U/L (ref 0–53)
AST: 45 U/L — AB (ref 0–37)
Albumin: 3.5 g/dL (ref 3.5–5.2)
BILIRUBIN DIRECT: 0.1 mg/dL (ref 0.0–0.3)
BILIRUBIN TOTAL: 0.3 mg/dL (ref 0.2–1.2)
Total Protein: 6 g/dL (ref 6.0–8.3)

## 2015-07-12 LAB — BASIC METABOLIC PANEL
BUN: 18 mg/dL (ref 6–23)
CHLORIDE: 103 meq/L (ref 96–112)
CO2: 27 mEq/L (ref 19–32)
Calcium: 8.5 mg/dL (ref 8.4–10.5)
Creatinine, Ser: 1.1 mg/dL (ref 0.40–1.50)
GFR: 78.42 mL/min (ref >60.00)
Glucose, Bld: 78 mg/dL (ref 70–99)
POTASSIUM: 3.6 meq/L (ref 3.5–5.1)
SODIUM: 139 meq/L (ref 135–145)

## 2015-07-12 LAB — TSH: TSH: 5.06 u[IU]/mL — ABNORMAL HIGH (ref 0.35–4.50)

## 2015-07-12 LAB — VITAMIN B12: VITAMIN B 12: 781 pg/mL (ref 211–911)

## 2015-07-12 LAB — SEDIMENTATION RATE: Sed Rate: 9 mm/hr (ref 0–22)

## 2015-07-12 NOTE — Progress Notes (Signed)
Pre visit review using our clinic review tool, if applicable. No additional management support is needed unless otherwise documented below in the visit note. 

## 2015-07-12 NOTE — Patient Instructions (Signed)
We will call you regarding MRI of thoracic spine.

## 2015-07-12 NOTE — Progress Notes (Signed)
   Subjective:    Patient ID: Donald [name], male    DOB: November 21, 1975, 39 y.o.   MRN: [id number]  HPI Patient seen as a work in with multiple issues. He states had some intermittent thoracic back pain occasionally cervical and lumbar pain for several months but especially over the past few months his had some progressive mid to lower thoracic back pain. He's had about one week of numbness and ear stages following a dermatome distribution around T9. Denies any injury. No history of skin rash. No fevers or chills. No appetite or weight changes. He states he's had some intermittent thoracic back pain for at least 6 months but especially worse over the past few weeks. No prior history of shingles.  Also relates some cough over the past couple weeks. Nonsmoker. No pleuritic pain. No fevers or chills. Cough is nonproductive. No hemoptysis. He denies any generalized adenopathy. He has had some muscular soreness abdominal musculature bilaterally. Denies any recent exercises which would've exacerbated.  No past medical history on file. Past Surgical History  Procedure Laterality Date  . Tonsillectomy    . Appendectomy      reports that he has never smoked. He does not have any smokeless tobacco history on file. He reports that he does not drink alcohol. His drug history is not on file. family history includes Heart failure in his father. No Known Allergies    Review of Systems  Constitutional: Positive for fatigue. Negative for fever, chills and appetite change.  Respiratory: Positive for cough. Negative for shortness of breath and wheezing.   Cardiovascular: Negative for chest pain, palpitations and leg swelling.  Gastrointestinal: Positive for abdominal pain. Negative for nausea and vomiting.  Musculoskeletal: Positive for back pain.  Skin: Negative for rash.  Neurological: Positive for numbness.  Hematological: Negative for adenopathy.       Objective:   Physical Exam  Constitutional: He  appears well-developed and well-nourished.  HENT:  Mouth/Throat: Oropharynx is clear and moist.  Neck: Neck supple. No thyromegaly present.  Cardiovascular: Normal rate and regular rhythm.   Pulmonary/Chest: Effort normal. No respiratory distress. He has no wheezes. He has no rales.  He has diminished breath sounds right base compared to left but no wheezes or rales.  Abdominal: Soft. Bowel sounds are normal. He exhibits no distension and no mass. There is no tenderness. There is no rebound and no guarding.  Musculoskeletal:  No reproducible spinal tenderness. No visible erythema or ecchymosis. He does have some mild nonspecific right thoracic muscular tenderness  Lymphadenopathy:    He has no cervical adenopathy.  Neurological:  Full strength upper and lower extremities. Symmetric reflexes. Subjectively decreased sensation right thoracic area around T8-T9 but he could feel some sensation even with monofilament testing  Skin: No rash noted.          Assessment & Plan:  #1 right thoracic back pain with some paresthesias following dermatomal type distribution. No rash to suggest shingles. He's had some pain in this region for several months intermittently. Set up MRI to further assess. Obtain labs with chemistries, sedimentation rate, CBC, TSH, B12 #2 cough with diminished breath sounds right base. Uncertain significance. No fever to suggest pneumonia. Obtain chest x-ray to further evaluate

## 2015-07-13 ENCOUNTER — Telehealth: Payer: Self-pay

## 2015-07-13 ENCOUNTER — Other Ambulatory Visit: Payer: Self-pay | Admitting: Family Medicine

## 2015-07-13 ENCOUNTER — Encounter: Payer: Self-pay | Admitting: *Deleted

## 2015-07-13 DIAGNOSIS — R05 Cough: Secondary | ICD-10-CM

## 2015-07-13 DIAGNOSIS — R059 Cough, unspecified: Secondary | ICD-10-CM

## 2015-07-13 DIAGNOSIS — R7989 Other specified abnormal findings of blood chemistry: Secondary | ICD-10-CM

## 2015-07-13 MED ORDER — LEVOFLOXACIN 500 MG PO TABS: 500.0000 mg | ORAL_TABLET | Freq: Every day | ORAL | Status: DC

## 2015-07-13 MED ORDER — GABAPENTIN 300 MG PO CAPS: 300.0000 mg | ORAL_CAPSULE | Freq: Every day | ORAL | Status: DC

## 2015-07-13 MED ORDER — AZITHROMYCIN 250 MG PO TABS: ORAL_TABLET | ORAL | Status: DC

## 2015-07-13 NOTE — Telephone Encounter (Signed)
Rx sent to pharmacy and patient is aware 

## 2015-07-13 NOTE — Telephone Encounter (Signed)
He was seen by Dr. Elease Hashimoto.  If he is in the office, can you forward to him.

## 2015-07-13 NOTE — Telephone Encounter (Signed)
I do not recommend opioids.  We could try Gabapentin 300 mg po qhs and after 3 nights if pain not improved increase to 300 mg po BID  #60 with one refill.

## 2015-07-13 NOTE — Telephone Encounter (Signed)
Rx sent to pharmacy. Pt informed per Donald [name]

## 2015-07-13 NOTE — Telephone Encounter (Signed)
Would take OTC mucinex DM.[name] KitchenMarland Kitchen

## 2015-07-13 NOTE — Telephone Encounter (Signed)
Pt states that he was having a difficult time last night he was in pain. He would like to know if he can get some pain medication. Pt also stated yesterday that he was taking Oxycodone today patient stated that he borrowed 2 pills of oxycodone from a friend.

## 2015-07-13 NOTE — Telephone Encounter (Signed)
Okay for cough syrup?

## 2015-07-13 NOTE — Telephone Encounter (Signed)
The pt called and is hoping to get a different medicine called in (besides the levoquin)  He states it is a cipro like drug - and he states he the side affects are "bad".  He is hoping for a different drug to be called in.  Callback - [phone]

## 2015-07-13 NOTE — Telephone Encounter (Signed)
Spoke with patient and he states he has had a bad reaction to both cipro and sulfa drugs.  He does not want levoquin.  He would also like a cough syrup if possible.  Gabapentin sent,

## 2015-07-13 NOTE — Telephone Encounter (Signed)
Zithromax Z-Pak.

## 2015-07-14 ENCOUNTER — Emergency Department (HOSPITAL_BASED_OUTPATIENT_CLINIC_OR_DEPARTMENT_OTHER): Payer: BLUE CROSS/BLUE SHIELD

## 2015-07-14 ENCOUNTER — Inpatient Hospital Stay (HOSPITAL_BASED_OUTPATIENT_CLINIC_OR_DEPARTMENT_OTHER)
Admission: EM | Admit: 2015-07-14 | Discharge: 2015-07-30 | DRG: 987 | Disposition: A | Discharge status: Home / Self Care | Payer: BLUE CROSS/BLUE SHIELD | Attending: Internal Medicine | Admitting: Internal Medicine

## 2015-07-14 ENCOUNTER — Encounter (HOSPITAL_BASED_OUTPATIENT_CLINIC_OR_DEPARTMENT_OTHER): Payer: Self-pay | Admitting: *Deleted

## 2015-07-14 DIAGNOSIS — C8510 Unspecified B-cell lymphoma, unspecified site: Secondary | ICD-10-CM | POA: Diagnosis present

## 2015-07-14 DIAGNOSIS — Z882 Allergy status to sulfonamides status: Secondary | ICD-10-CM

## 2015-07-14 DIAGNOSIS — J90 Pleural effusion, not elsewhere classified: Principal | ICD-10-CM | POA: Diagnosis present

## 2015-07-14 DIAGNOSIS — C8370 Burkitt lymphoma, unspecified site: Secondary | ICD-10-CM

## 2015-07-14 DIAGNOSIS — K219 Gastro-esophageal reflux disease without esophagitis: Secondary | ICD-10-CM | POA: Diagnosis present

## 2015-07-14 DIAGNOSIS — E876 Hypokalemia: Secondary | ICD-10-CM | POA: Diagnosis not present

## 2015-07-14 DIAGNOSIS — K7689 Other specified diseases of liver: Secondary | ICD-10-CM | POA: Diagnosis present

## 2015-07-14 DIAGNOSIS — Z9889 Other specified postprocedural states: Secondary | ICD-10-CM

## 2015-07-14 DIAGNOSIS — R740 Nonspecific elevation of levels of transaminase and lactic acid dehydrogenase [LDH]: Secondary | ICD-10-CM | POA: Diagnosis not present

## 2015-07-14 DIAGNOSIS — J95821 Acute postprocedural respiratory failure: Secondary | ICD-10-CM | POA: Diagnosis not present

## 2015-07-14 DIAGNOSIS — C8590 Non-Hodgkin lymphoma, unspecified, unspecified site: Secondary | ICD-10-CM | POA: Insufficient documentation

## 2015-07-14 DIAGNOSIS — R188 Other ascites: Secondary | ICD-10-CM | POA: Insufficient documentation

## 2015-07-14 DIAGNOSIS — R591 Generalized enlarged lymph nodes: Secondary | ICD-10-CM | POA: Diagnosis present

## 2015-07-14 DIAGNOSIS — K5900 Constipation, unspecified: Secondary | ICD-10-CM | POA: Insufficient documentation

## 2015-07-14 DIAGNOSIS — R140 Abdominal distension (gaseous): Secondary | ICD-10-CM | POA: Insufficient documentation

## 2015-07-14 DIAGNOSIS — J9811 Atelectasis: Secondary | ICD-10-CM | POA: Diagnosis present

## 2015-07-14 DIAGNOSIS — J9601 Acute respiratory failure with hypoxia: Secondary | ICD-10-CM

## 2015-07-14 DIAGNOSIS — Z5111 Encounter for antineoplastic chemotherapy: Secondary | ICD-10-CM

## 2015-07-14 DIAGNOSIS — M549 Dorsalgia, unspecified: Secondary | ICD-10-CM | POA: Diagnosis not present

## 2015-07-14 DIAGNOSIS — E8770 Fluid overload, unspecified: Secondary | ICD-10-CM | POA: Diagnosis not present

## 2015-07-14 DIAGNOSIS — E883 Tumor lysis syndrome: Secondary | ICD-10-CM

## 2015-07-14 DIAGNOSIS — N179 Acute kidney failure, unspecified: Secondary | ICD-10-CM | POA: Diagnosis not present

## 2015-07-14 DIAGNOSIS — Z79899 Other long term (current) drug therapy: Secondary | ICD-10-CM

## 2015-07-14 DIAGNOSIS — Y848 Other medical procedures as the cause of abnormal reaction of the patient, or of later complication, without mention of misadventure at the time of the procedure: Secondary | ICD-10-CM | POA: Diagnosis not present

## 2015-07-14 DIAGNOSIS — IMO0002 Reserved for concepts with insufficient information to code with codable children: Secondary | ICD-10-CM

## 2015-07-14 DIAGNOSIS — F411 Generalized anxiety disorder: Secondary | ICD-10-CM

## 2015-07-14 DIAGNOSIS — C786 Secondary malignant neoplasm of retroperitoneum and peritoneum: Secondary | ICD-10-CM | POA: Diagnosis present

## 2015-07-14 DIAGNOSIS — K1231 Oral mucositis (ulcerative) due to antineoplastic therapy: Secondary | ICD-10-CM

## 2015-07-14 DIAGNOSIS — R0902 Hypoxemia: Secondary | ICD-10-CM

## 2015-07-14 DIAGNOSIS — R001 Bradycardia, unspecified: Secondary | ICD-10-CM | POA: Diagnosis not present

## 2015-07-14 DIAGNOSIS — R7989 Other specified abnormal findings of blood chemistry: Secondary | ICD-10-CM | POA: Insufficient documentation

## 2015-07-14 DIAGNOSIS — K1230 Oral mucositis (ulcerative), unspecified: Secondary | ICD-10-CM | POA: Diagnosis not present

## 2015-07-14 DIAGNOSIS — Z91048 Other nonmedicinal substance allergy status: Secondary | ICD-10-CM

## 2015-07-14 DIAGNOSIS — R180 Malignant ascites: Secondary | ICD-10-CM | POA: Diagnosis present

## 2015-07-14 DIAGNOSIS — Z8249 Family history of ischemic heart disease and other diseases of the circulatory system: Secondary | ICD-10-CM

## 2015-07-14 DIAGNOSIS — R229 Localized swelling, mass and lump, unspecified: Secondary | ICD-10-CM

## 2015-07-14 DIAGNOSIS — R0600 Dyspnea, unspecified: Secondary | ICD-10-CM

## 2015-07-14 DIAGNOSIS — J810 Acute pulmonary edema: Secondary | ICD-10-CM | POA: Diagnosis present

## 2015-07-14 DIAGNOSIS — R0602 Shortness of breath: Secondary | ICD-10-CM

## 2015-07-14 DIAGNOSIS — R945 Abnormal results of liver function studies: Secondary | ICD-10-CM

## 2015-07-14 HISTORY — DX: Infectious mononucleosis, unspecified without complication: B27.90

## 2015-07-14 HISTORY — DX: Cannabis use, unspecified, uncomplicated: F12.90

## 2015-07-14 LAB — BASIC METABOLIC PANEL
ANION GAP: 10 (ref 5–15)
BUN: 20 mg/dL (ref 6–20)
CALCIUM: 8.5 mg/dL — AB (ref 8.9–10.3)
CO2: 26 mmol/L (ref 22–32)
Chloride: 101 mmol/L (ref 101–111)
Creatinine, Ser: 1.23 mg/dL (ref 0.61–1.24)
Glucose, Bld: 97 mg/dL (ref 65–99)
POTASSIUM: 4 mmol/L (ref 3.5–5.1)
SODIUM: 137 mmol/L (ref 135–145)

## 2015-07-14 LAB — CBC
HCT: 46.1 % (ref 39.0–52.0)
Hemoglobin: 15.7 g/dL (ref 13.0–17.0)
MCH: 29 pg (ref 26.0–34.0)
MCHC: 34.1 g/dL (ref 30.0–36.0)
MCV: 85.2 fL (ref 78.0–100.0)
PLATELETS: 216 10*3/uL (ref 150–400)
RBC: 5.41 MIL/uL (ref 4.22–5.81)
RDW: 12.3 % (ref 11.5–15.5)
WBC: 7 10*3/uL (ref 4.0–10.5)

## 2015-07-14 MED ORDER — KETOROLAC TROMETHAMINE 30 MG/ML IJ SOLN
30.0000 mg | Freq: Once | INTRAMUSCULAR | Status: AC
Start: 2015-07-14 — End: 2015-07-14
  Administered 2015-07-14: 30 mg via INTRAVENOUS
  Filled 2015-07-14: qty 1

## 2015-07-14 MED ORDER — HYDROMORPHONE HCL 1 MG/ML IJ SOLN
1.0000 mg | Freq: Once | INTRAMUSCULAR | Status: AC
  Administered 2015-07-14: 1 mg via INTRAVENOUS
  Filled 2015-07-14: qty 1

## 2015-07-14 MED ORDER — ONDANSETRON HCL 4 MG/2ML IJ SOLN
4.0000 mg | Freq: Once | INTRAMUSCULAR | Status: AC
  Administered 2015-07-14: 4 mg via INTRAVENOUS
  Filled 2015-07-14: qty 2

## 2015-07-14 MED ORDER — IOHEXOL 350 MG/ML SOLN
100.0000 mL | Freq: Once | INTRAVENOUS | Status: AC | PRN
  Administered 2015-07-14: 100 mL via INTRAVENOUS

## 2015-07-14 NOTE — ED Notes (Signed)
Pt c/o back pain and states he needs some pain medication other than Neurontin,  seen by PMD for same x 2 days, pt states has been taking oxycodone x 3 days without relief. Right flank pain with rash  Described as burning.

## 2015-07-14 NOTE — ED Notes (Signed)
Report called to [facility] to [employer].

## 2015-07-14 NOTE — ED Provider Notes (Signed)
CSN: [id number]     Arrival date & time 07/14/15  43 History   First MD Initiated Contact with Patient 07/14/15 1605     Chief Complaint  Patient presents with  . Flank Pain     (Consider location/radiation/quality/duration/timing/severity/associated sxs/prior Treatment) HPI Comments: 39 year old male presenting with right-sided chest pain 1 week. Pain initially described as a burning in sensation of sharp pain beneath his right lower ribs. Pain worse with deep inspiration or leaning forward. Over the past 2 days he developed a rash to the right side of his chest that is not itchy. Reports a history of Epstein-Barr and has had a rash similar to this in the past. He did have chickenpox as a child. Has never had shingles. Saw his PCP 2 days ago, had a chest x-ray and was told he may have an early pneumonia and was prescribed Levaquin, Z-Pak and Neurontin all without relief and symptoms are worsening. Denies fevers. Denies shortness of breath. Over the past 3 days has taken his wife's oxycodone with no relief. Has been doing a lot of work around his house recently and is constantly around dust.  The history is provided by the patient.    History reviewed. No pertinent past medical history. Past Surgical History  Procedure Laterality Date  . Tonsillectomy    . Appendectomy     Family History  Problem Relation Age of Onset  . Heart failure Father    Social History  Substance Use Topics  . Smoking status: Never Smoker   . Smokeless tobacco: None  . Alcohol Use: No    Review of Systems  Respiratory: Positive for cough.   Cardiovascular: Positive for chest pain.  Skin: Positive for rash.  All other systems reviewed and are negative.     Allergies  Review of patient's allergies indicates no known allergies.  Home Medications   Prior to Admission medications   Medication Sig Start Date End Date Taking? Authorizing Provider  azithromycin (ZITHROMAX) 250 MG tablet Take as  directed 07/13/15   Eulas Post, MD  gabapentin (NEURONTIN) 300 MG capsule Take 1 capsule (300 mg total) by mouth at bedtime. 07/13/15   Eulas Post, MD  levofloxacin (LEVAQUIN) 500 MG tablet Take 1 tablet (500 mg total) by mouth daily. 07/13/15   Eulas Post, MD   BP 130/76 mmHg  Pulse 68  Temp(Src) 98.5 F (36.9 C) (Oral)  Resp 22  SpO2 96% Physical Exam  Constitutional: He is oriented to person, place, and time. He appears well-developed and well-nourished. No distress.  Uncomfortable but in no apparent distress.  HENT:  Head: Normocephalic and atraumatic.  Mouth/Throat: Oropharynx is clear and moist.  Eyes: Conjunctivae and EOM are normal. Pupils are equal, round, and reactive to light.  Neck: Normal range of motion. Neck supple. No JVD present.  Cardiovascular: Normal rate, regular rhythm, normal heart sounds and intact distal pulses.   No extremity edema.  Pulmonary/Chest: Effort normal. No respiratory distress. He has decreased breath sounds in the right middle field and the right lower field.  Abdominal: Soft. Bowel sounds are normal. There is no tenderness.  Musculoskeletal: Normal range of motion. He exhibits no edema.  Neurological: He is alert and oriented to person, place, and time. He has normal strength. No sensory deficit.  Speech fluent, goal oriented. Moves extremities without ataxia. Equal grip strength bilateral.  Skin: Skin is warm and dry. He is not diaphoretic.  Mottled appearing rash on right side of chest. Nontender.  Rash is not elevated and there are no vesicles.  Psychiatric: He has a normal mood and affect. His behavior is normal.  Nursing note and vitals reviewed.   ED Course  Procedures (including critical care time) Labs Review Labs Reviewed  BASIC METABOLIC PANEL - Abnormal; Notable for the following:    Calcium 8.5 (*)    All other components within normal limits  CBC    Imaging Review Dg Chest 2 View  07/14/2015   CLINICAL  DATA:  Right-sided chest pain for 2-3 days. Shortness of breath.  EXAM: CHEST  2 VIEW  COMPARISON:  07/12/2015  FINDINGS: Heart size is probably normal. Suspect lung opacity at the right base in addition to the elevated hemidiaphragm. Opacity at the right lung base may be related to subpulmonic effusion. Overall the appearance is stable since previous exam. There is a trace left pleural effusion. No pulmonary edema.  IMPRESSION: Persistent significant opacity at the right lung base. Consider right lateral decubitus view to determine component of layering pleural effusion.   Electronically Signed   By: Nolon Nations M.D.   On: 07/14/2015 16:56   Ct Angio Chest Pe W/cm &/or Wo Cm  07/14/2015   CLINICAL DATA:  Right chest pain  EXAM: CT ANGIOGRAPHY CHEST WITH CONTRAST  TECHNIQUE: Multidetector CT imaging of the chest was performed using the standard protocol during bolus administration of intravenous contrast. Multiplanar CT image reconstructions and MIPs were obtained to evaluate the vascular anatomy.  CONTRAST:  134mL OMNIPAQUE IOHEXOL 350 MG/ML SOLN  COMPARISON:  Chest x-ray 07/14/2015  FINDINGS: Negative for pulmonary embolism. Negative for aortic dissection or aneurysm  Moderately large right pleural effusion with compressive atelectasis of the right lower lobe. Atelectasis also present in the right middle lobe. Partial collapse of the right upper lobe medially adjacent to the mediastinum. This extends into the right upper lobe laterally. Small left pleural effusion. No significant infiltrate on the left.  No adenopathy is present in the mediastinum.  No acute bony abnormality  In the upper abdomen, there is mild to moderate ascites. This could be due to liver disease. There is fluid around the liver and spleen. The spleen is not enlarged. Limited imaging of the pancreas and kidneys are grossly normal. There is soft tissue infiltration in the omentum. Carcinoma not excluded.  Review of the MIP images confirms  the above findings.  IMPRESSION: Negative for pulmonary embolism  Moderate right pleural effusion with compressive atelectasis in the right lower lobe. Additional atelectasis in the right middle lobe and right upper lobe. No definite mass or pneumonia however infection and tumor are in the differential. There is a small left effusion  Concerning findings in the abdomen. There is ascites around the liver and spleen. There is soft tissue density involving the omentum which can be seen with carcinoma. Further imaging of the abdomen with CT abdomen pelvis with contrast is suggested.   Electronically Signed   By: Franchot Gallo M.D.   On: 07/14/2015 18:28   I have personally reviewed and evaluated these images and lab results as part of my medical decision-making.   EKG Interpretation None      MDM   Final diagnoses:  Pleural effusion, right   Nontoxic appearing, NAD. AF VSS. On evaluation of chest x-ray from 2 days ago, it appears to be a possible pleural effusion. Suspicion for pleural effusion versus PE, CT angiogram obtained with results as stated above. Low suspicion for infectious pleural effusion given no fevers or  leukocytosis, however was started on 2 antibiotics 2 days ago. Patient will be admitted for pleurocentesis, further evaluation and monitoring. Admission accepted by Dr. Dreama Saa, [facility]. Patient initially requested to go by private vehicle to the hospital to avoid a bill from [place], when discussing with [facility], they state that this is a free service and he will not be charged and it is best if he goes by [employer] as per both [facility] and hospitalist. Stable for transfer.  Discussed with attending Dr. Sabra Heck who also evaluated patient and agrees with plan of care.  Carman Ching, PA-C 07/14/15 2127  Noemi Chapel, MD 07/14/15 [phone]

## 2015-07-14 NOTE — ED Notes (Signed)
Placed pt on 2LNC due to pulse ox sat 93%. Pulse ox now at 96%

## 2015-07-14 NOTE — ED Notes (Signed)
[facility] at bedside 

## 2015-07-15 ENCOUNTER — Inpatient Hospital Stay (HOSPITAL_COMMUNITY): Payer: BLUE CROSS/BLUE SHIELD

## 2015-07-15 DIAGNOSIS — C786 Secondary malignant neoplasm of retroperitoneum and peritoneum: Secondary | ICD-10-CM | POA: Diagnosis present

## 2015-07-15 DIAGNOSIS — K219 Gastro-esophageal reflux disease without esophagitis: Secondary | ICD-10-CM | POA: Diagnosis present

## 2015-07-15 DIAGNOSIS — E883 Tumor lysis syndrome: Secondary | ICD-10-CM | POA: Diagnosis present

## 2015-07-15 DIAGNOSIS — M549 Dorsalgia, unspecified: Secondary | ICD-10-CM | POA: Diagnosis present

## 2015-07-15 DIAGNOSIS — R918 Other nonspecific abnormal finding of lung field: Secondary | ICD-10-CM | POA: Diagnosis not present

## 2015-07-15 DIAGNOSIS — C8510 Unspecified B-cell lymphoma, unspecified site: Secondary | ICD-10-CM | POA: Diagnosis present

## 2015-07-15 DIAGNOSIS — N179 Acute kidney failure, unspecified: Secondary | ICD-10-CM | POA: Diagnosis not present

## 2015-07-15 DIAGNOSIS — E8770 Fluid overload, unspecified: Secondary | ICD-10-CM | POA: Diagnosis not present

## 2015-07-15 DIAGNOSIS — J9811 Atelectasis: Secondary | ICD-10-CM | POA: Diagnosis present

## 2015-07-15 DIAGNOSIS — R001 Bradycardia, unspecified: Secondary | ICD-10-CM | POA: Diagnosis not present

## 2015-07-15 DIAGNOSIS — C8590 Non-Hodgkin lymphoma, unspecified, unspecified site: Secondary | ICD-10-CM | POA: Diagnosis not present

## 2015-07-15 DIAGNOSIS — K7689 Other specified diseases of liver: Secondary | ICD-10-CM | POA: Diagnosis present

## 2015-07-15 DIAGNOSIS — J95821 Acute postprocedural respiratory failure: Secondary | ICD-10-CM | POA: Diagnosis not present

## 2015-07-15 DIAGNOSIS — Z882 Allergy status to sulfonamides status: Secondary | ICD-10-CM | POA: Diagnosis not present

## 2015-07-15 DIAGNOSIS — R740 Nonspecific elevation of levels of transaminase and lactic acid dehydrogenase [LDH]: Secondary | ICD-10-CM | POA: Diagnosis not present

## 2015-07-15 DIAGNOSIS — Z79899 Other long term (current) drug therapy: Secondary | ICD-10-CM | POA: Diagnosis not present

## 2015-07-15 DIAGNOSIS — J810 Acute pulmonary edema: Secondary | ICD-10-CM | POA: Diagnosis present

## 2015-07-15 DIAGNOSIS — R140 Abdominal distension (gaseous): Secondary | ICD-10-CM | POA: Diagnosis not present

## 2015-07-15 DIAGNOSIS — R7989 Other specified abnormal findings of blood chemistry: Secondary | ICD-10-CM | POA: Diagnosis not present

## 2015-07-15 DIAGNOSIS — J948 Other specified pleural conditions: Secondary | ICD-10-CM | POA: Diagnosis not present

## 2015-07-15 DIAGNOSIS — K1230 Oral mucositis (ulcerative), unspecified: Secondary | ICD-10-CM | POA: Diagnosis not present

## 2015-07-15 DIAGNOSIS — Y848 Other medical procedures as the cause of abnormal reaction of the patient, or of later complication, without mention of misadventure at the time of the procedure: Secondary | ICD-10-CM | POA: Diagnosis not present

## 2015-07-15 DIAGNOSIS — E876 Hypokalemia: Secondary | ICD-10-CM | POA: Diagnosis not present

## 2015-07-15 DIAGNOSIS — C8378 Burkitt lymphoma, lymph nodes of multiple sites: Secondary | ICD-10-CM | POA: Diagnosis not present

## 2015-07-15 DIAGNOSIS — R188 Other ascites: Secondary | ICD-10-CM | POA: Diagnosis not present

## 2015-07-15 DIAGNOSIS — Z8249 Family history of ischemic heart disease and other diseases of the circulatory system: Secondary | ICD-10-CM | POA: Diagnosis not present

## 2015-07-15 DIAGNOSIS — R55 Syncope and collapse: Secondary | ICD-10-CM | POA: Diagnosis not present

## 2015-07-15 DIAGNOSIS — R591 Generalized enlarged lymph nodes: Secondary | ICD-10-CM | POA: Diagnosis present

## 2015-07-15 DIAGNOSIS — R180 Malignant ascites: Secondary | ICD-10-CM | POA: Diagnosis present

## 2015-07-15 DIAGNOSIS — R229 Localized swelling, mass and lump, unspecified: Secondary | ICD-10-CM | POA: Diagnosis not present

## 2015-07-15 DIAGNOSIS — Z91048 Other nonmedicinal substance allergy status: Secondary | ICD-10-CM | POA: Diagnosis not present

## 2015-07-15 DIAGNOSIS — C8370 Burkitt lymphoma, unspecified site: Secondary | ICD-10-CM | POA: Diagnosis present

## 2015-07-15 DIAGNOSIS — J90 Pleural effusion, not elsewhere classified: Secondary | ICD-10-CM | POA: Diagnosis present

## 2015-07-15 DIAGNOSIS — J9601 Acute respiratory failure with hypoxia: Secondary | ICD-10-CM | POA: Diagnosis not present

## 2015-07-15 DIAGNOSIS — J9691 Respiratory failure, unspecified with hypoxia: Secondary | ICD-10-CM | POA: Diagnosis not present

## 2015-07-15 DIAGNOSIS — E790 Hyperuricemia without signs of inflammatory arthritis and tophaceous disease: Secondary | ICD-10-CM | POA: Diagnosis not present

## 2015-07-15 LAB — GRAM STAIN

## 2015-07-15 LAB — TSH: TSH: 4.807 u[IU]/mL — ABNORMAL HIGH (ref 0.350–4.500)

## 2015-07-15 LAB — COMPREHENSIVE METABOLIC PANEL
ALBUMIN: 3.1 g/dL — AB (ref 3.5–5.0)
ALT: 44 U/L (ref 17–63)
ANION GAP: 12 (ref 5–15)
AST: 66 U/L — ABNORMAL HIGH (ref 15–41)
Alkaline Phosphatase: 53 U/L (ref 38–126)
BILIRUBIN TOTAL: 0.9 mg/dL (ref 0.3–1.2)
BUN: 17 mg/dL (ref 6–20)
CHLORIDE: 99 mmol/L — AB (ref 101–111)
CO2: 24 mmol/L (ref 22–32)
Calcium: 8 mg/dL — ABNORMAL LOW (ref 8.9–10.3)
Creatinine, Ser: 1.22 mg/dL (ref 0.61–1.24)
GFR calc Af Amer: >60 mL/min (ref >60)
GFR calc non Af Amer: >60 mL/min (ref >60)
GLUCOSE: 67 mg/dL (ref 65–99)
POTASSIUM: 3.9 mmol/L (ref 3.5–5.1)
SODIUM: 135 mmol/L (ref 135–145)
TOTAL PROTEIN: 5.7 g/dL — AB (ref 6.5–8.1)

## 2015-07-15 LAB — CBC WITH DIFFERENTIAL/PLATELET
BASOS ABS: 0 10*3/uL (ref 0.0–0.1)
BASOS PCT: 1 % (ref 0–1)
EOS ABS: 0.4 10*3/uL (ref 0.0–0.7)
Eosinophils Relative: 5 % (ref 0–5)
HCT: 44.7 % (ref 39.0–52.0)
HEMOGLOBIN: 15 g/dL (ref 13.0–17.0)
Lymphocytes Relative: 29 % (ref 12–46)
Lymphs Abs: 2.2 10*3/uL (ref 0.7–4.0)
MCH: 29 pg (ref 26.0–34.0)
MCHC: 33.6 g/dL (ref 30.0–36.0)
MCV: 86.5 fL (ref 78.0–100.0)
Monocytes Absolute: 0.6 10*3/uL (ref 0.1–1.0)
Monocytes Relative: 8 % (ref 3–12)
NEUTROS ABS: 4.3 10*3/uL (ref 1.7–7.7)
NEUTROS PCT: 57 % (ref 43–77)
Platelets: 222 10*3/uL (ref 150–400)
RBC: 5.17 MIL/uL (ref 4.22–5.81)
RDW: 12.5 % (ref 11.5–15.5)
WBC: 7.6 10*3/uL (ref 4.0–10.5)

## 2015-07-15 LAB — BODY FLUID CELL COUNT WITH DIFFERENTIAL
Lymphs, Fluid: 78 %
Monocyte-Macrophage-Serous Fluid: 12 % — ABNORMAL LOW (ref 50–90)
Neutrophil Count, Fluid: 12 % (ref 0–25)
Total Nucleated Cell Count, Fluid: 15456 cu mm — ABNORMAL HIGH (ref 0–1000)

## 2015-07-15 LAB — PROTEIN, BODY FLUID: Total protein, fluid: 3.7 g/dL

## 2015-07-15 LAB — T4, FREE: Free T4: 1.32 ng/dL — ABNORMAL HIGH (ref 0.61–1.12)

## 2015-07-15 LAB — LACTATE DEHYDROGENASE: LDH: 1033 U/L — ABNORMAL HIGH (ref 98–192)

## 2015-07-15 LAB — LACTATE DEHYDROGENASE, PLEURAL OR PERITONEAL FLUID: LD, Fluid: 2784 U/L — ABNORMAL HIGH (ref 3–23)

## 2015-07-15 LAB — PROTEIN, TOTAL: Total Protein: 5.7 g/dL — ABNORMAL LOW (ref 6.5–8.1)

## 2015-07-15 LAB — APTT: APTT: 30 s (ref 24–37)

## 2015-07-15 LAB — PROTIME-INR
INR: 1.18 (ref 0.00–1.49)
PROTHROMBIN TIME: 15.1 s (ref 11.6–15.2)

## 2015-07-15 LAB — GLUCOSE, SEROUS FLUID: Glucose, Fluid: <20 mg/dL

## 2015-07-15 MED ORDER — IOHEXOL 300 MG/ML  SOLN
100.0000 mL | Freq: Once | INTRAMUSCULAR | Status: AC | PRN
  Administered 2015-07-15: 100 mL via INTRAVENOUS

## 2015-07-15 MED ORDER — LORAZEPAM 1 MG PO TABS
1.0000 mg | ORAL_TABLET | Freq: Four times a day (QID) | ORAL | Status: DC | PRN
  Administered 2015-07-16 – 2015-07-22 (×8): 1 mg via ORAL
  Filled 2015-07-15 (×8): qty 1

## 2015-07-15 MED ORDER — HYDROCODONE-ACETAMINOPHEN 7.5-325 MG PO TABS: 1.0000 | ORAL_TABLET | Freq: Four times a day (QID) | ORAL | Status: DC | PRN

## 2015-07-15 MED ORDER — ONDANSETRON HCL 4 MG/2ML IJ SOLN
4.0000 mg | Freq: Once | INTRAMUSCULAR | Status: DC
  Filled 2015-07-15: qty 2

## 2015-07-15 MED ORDER — ONDANSETRON HCL 4 MG/2ML IJ SOLN
INTRAMUSCULAR | Status: AC
  Administered 2015-07-15: 4 mg
  Filled 2015-07-15: qty 2

## 2015-07-15 MED ORDER — SODIUM CHLORIDE 0.9 % IV SOLN
INTRAVENOUS | Status: DC
  Administered 2015-07-15 (×2): via INTRAVENOUS

## 2015-07-15 MED ORDER — KETOROLAC TROMETHAMINE 15 MG/ML IJ SOLN
15.0000 mg | Freq: Once | INTRAMUSCULAR | Status: AC
  Administered 2015-07-15: 15 mg via INTRAVENOUS
  Filled 2015-07-15: qty 1

## 2015-07-15 MED ORDER — IOHEXOL 300 MG/ML  SOLN
25.0000 mL | INTRAMUSCULAR | Status: AC
  Administered 2015-07-15 (×2): 25 mL via ORAL

## 2015-07-15 MED ORDER — MORPHINE SULFATE (PF) 2 MG/ML IV SOLN
2.0000 mg | INTRAVENOUS | Status: DC | PRN
  Administered 2015-07-15 – 2015-07-20 (×10): 2 mg via INTRAVENOUS
  Filled 2015-07-15 (×11): qty 1

## 2015-07-15 NOTE — Procedures (Signed)
Thoracentesis Procedure Note  Pre-operative Diagnosis: R pleural effusion  Post-operative Diagnosis: same  Indications: R Pleural effusion  Procedure Details  Consent: Informed consent was obtained. Risks of the procedure were discussed including: infection, bleeding, pain, pneumothorax.  Under sterile conditions the patient was positioned. Betadine solution and sterile drapes were utilized.  1% buffered lidocaine was used to anesthetize the 6 rib space. Fluid was obtained without any difficulties and minimal blood loss.  A dressing was applied to the wound and wound care instructions were provided.   Findings 1500 ml of cloudy pleural fluid was obtained. A sample was sent to Pathology for cytogenetics, flow, and cell counts, as well as for infection analysis.  Complications:  None; patient tolerated the procedure well.          Condition: stable  Plan A follow up chest x-ray was ordered. Bed Rest for 1 hours. Tylenol 650 mg. for pain.  Attending Attestation: I was present and scrubbed for the entire procedure.  U/S used in procedure.  Rush Farmer, M.D. [facility] Pulmonary/Critical Care Medicine. Pager: [phone]. After hours pager: [phone].

## 2015-07-15 NOTE — Progress Notes (Signed)
[name] Progress Note Patient Name: Donald [name] DOB: 1976/02/08 MRN: [id number]   Date of Service  07/15/2015  HPI/Events of Note  Patient c/o pain s/p thoracentesis. Creatinine = 1.22.  eICU Interventions  Will order Toradol 15 mg IV X 1.     Intervention Category Intermediate Interventions: Pain - evaluation and management  Sommer,Steven Eugene 07/15/2015, 3:36 PM

## 2015-07-15 NOTE — H&P (Addendum)
History and Physical  Donald [name] TIW:[id number] DOB: 1975-11-22 DOA: 07/14/2015  PCP: Drema Pry, DO   Chief Complaint: back pain   History of Present Illness:  Patient is a 39 year old male with no significant past medical history who was in his usual state of health until 2 weeks ago when he started having back pain especially when riding his bike. He got a CXR last week that suggested a possible infection so he was given antibiotics. But last night he could not sleep due to pain and tingling sensation in right chest. He was a little short of breath on exertion but had no chest pain. He denied fever/chills. He had profuse sweating over the last few nights. He had no weight loss or loss of appetite.             Review of Systems:  CONSTITUTIONAL:  night sweats.  No fatigue, malaise, lethargy.  No fever or chills. Eyes:  No visual changes.  No eye pain.  No eye discharge.   ENT:    No epistaxis.  No sinus pain.  No sore throat.  No ear pain.  No congestion. RESPIRATORY:  cough.  No wheeze.  No hemoptysis.  No shortness of breath. CARDIOVASCULAR:  No chest pains.  No palpitations. GASTROINTESTINAL:  No abdominal pain.  No nausea or vomiting.  No diarrhea or constipation.  No hematemesis.  No hematochezia.  No melena. GENITOURINARY:  No urgency.  No frequency.  No dysuria.  No hematuria.  No obstructive symptoms.  No discharge.  No pain.  No significant abnormal bleeding. MUSCULOSKELETAL:  musculoskeletal pain.  No joint swelling.  No arthritis. NEUROLOGICAL:  No confusion.  No weakness. No headache. No seizure. PSYCHIATRIC:  No depression. No anxiety. No suicidal ideation. SKIN:  No rashes.  No lesions.  No wounds. ENDOCRINE:  No unexplained weight loss.  No polydipsia.  No polyuria.  No polyphagia. HEMATOLOGIC:  No anemia.  No purpura.  No petechiae.  No bleeding.  ALLERGIC AND IMMUNOLOGIC:  No pruritus.  No swelling Other:  Past Medical and Surgical History:   History  reviewed. No pertinent past medical history. Past Surgical History  Procedure Laterality Date  . Tonsillectomy    . Appendectomy      Social History:   reports that he has never smoked. He does not have any smokeless tobacco history on file. He reports that he does not drink alcohol. His drug history is not on file.   No Known Allergies  Family History  Problem Relation Age of Onset  . Heart failure Father       Prior to Admission medications   Medication Sig Start Date End Date Taking? Authorizing Provider  azithromycin (ZITHROMAX) 250 MG tablet Take as directed 07/13/15   Eulas Post, MD  gabapentin (NEURONTIN) 300 MG capsule Take 1 capsule (300 mg total) by mouth at bedtime. 07/13/15   Eulas Post, MD  levofloxacin (LEVAQUIN) 500 MG tablet Take 1 tablet (500 mg total) by mouth daily. 07/13/15   Eulas Post, MD    Physical Exam: BP 139/77 mmHg  Pulse 53  Temp(Src) 98.5 F (36.9 C) (Oral)  Resp 20  Ht 6' (1.829 m)  Wt 93.8 kg (206 lb 12.7 oz)  BMI 28.04 kg/m2  SpO2 98%  GENERAL : Well developed, well nourished, alert and cooperative, and appears to be in no acute distress. HEAD: normocephalic. EYES: PERRL, EOMI. Fundi normal, vision is grossly intact. EARS: External auditory canals and tympanic  membranes clear, hearing grossly intact. NOSE: No nasal discharge. THROAT: Oral cavity and pharynx normal. No inflammation, swelling, exudate, or lesions.  NECK: Neck supple, non-tender without lymphadenopathy, masses or thyromegaly. CARDIAC: Normal S1 and S2. No S3, S4 or murmurs. Rhythm is regular. There is no peripheral edema, cyanosis or pallor. Extremities are warm and well perfused. No carotid bruits. LUNGS:decrased breathing sounds on right side with dullness to percussion  ABDOMEN: Positive bowel sounds. Soft, distention with mild tenderness No guarding or rebound. No masses. MUSKULOSKELETAL: Adequately aligned spine. ROM intact spine and extremities. No  joint erythema or tenderness. Normal muscular development. Normal gait. EXTREMITIES: No significant deformity or joint abnormality. No edema. Peripheral pulses intact. No varicosities. NEUROLOGICAL: The mental examination revealed the patient was oriented to person, place, and time.CN II-XII intact. Strength and sensation symmetric and intact throughout. Reflexes 2+ throughout. Cerebellar testing normal. SKIN: Skin normal color, texture and turgor with no lesions or eruptions. PSYCHIATRIC:  The patient was able to demonstrate good judgement and reason, without hallucinations, abnormal affect or abnormal behaviors during the examination. Patient is not suicidal.          Labs on Admission:  Reviewed.   Radiological Exams on Admission: Dg Chest 2 View  07/14/2015   CLINICAL DATA:  Right-sided chest pain for 2-3 days. Shortness of breath.  EXAM: CHEST  2 VIEW  COMPARISON:  07/12/2015  FINDINGS: Heart size is probably normal. Suspect lung opacity at the right base in addition to the elevated hemidiaphragm. Opacity at the right lung base may be related to subpulmonic effusion. Overall the appearance is stable since previous exam. There is a trace left pleural effusion. No pulmonary edema.  IMPRESSION: Persistent significant opacity at the right lung base. Consider right lateral decubitus view to determine component of layering pleural effusion.   Electronically Signed   By: Nolon Nations M.D.   On: 07/14/2015 16:56   Ct Angio Chest Pe W/cm &/or Wo Cm  07/14/2015   CLINICAL DATA:  Right chest pain  EXAM: CT ANGIOGRAPHY CHEST WITH CONTRAST  TECHNIQUE: Multidetector CT imaging of the chest was performed using the standard protocol during bolus administration of intravenous contrast. Multiplanar CT image reconstructions and MIPs were obtained to evaluate the vascular anatomy.  CONTRAST:  132mL OMNIPAQUE IOHEXOL 350 MG/ML SOLN  COMPARISON:  Chest x-ray 07/14/2015  FINDINGS: Negative for pulmonary embolism.  Negative for aortic dissection or aneurysm  Moderately large right pleural effusion with compressive atelectasis of the right lower lobe. Atelectasis also present in the right middle lobe. Partial collapse of the right upper lobe medially adjacent to the mediastinum. This extends into the right upper lobe laterally. Small left pleural effusion. No significant infiltrate on the left.  No adenopathy is present in the mediastinum.  No acute bony abnormality  In the upper abdomen, there is mild to moderate ascites. This could be due to liver disease. There is fluid around the liver and spleen. The spleen is not enlarged. Limited imaging of the pancreas and kidneys are grossly normal. There is soft tissue infiltration in the omentum. Carcinoma not excluded.  Review of the MIP images confirms the above findings.  IMPRESSION: Negative for pulmonary embolism  Moderate right pleural effusion with compressive atelectasis in the right lower lobe. Additional atelectasis in the right middle lobe and right upper lobe. No definite mass or pneumonia however infection and tumor are in the differential. There is a small left effusion  Concerning findings in the abdomen. There is ascites around  the liver and spleen. There is soft tissue density involving the omentum which can be seen with carcinoma. Further imaging of the abdomen with CT abdomen pelvis with contrast is suggested.   Electronically Signed   By: Franchot Gallo M.D.   On: 07/14/2015 18:28      Assessment/Plan  Right side pleural effusion:   differential diagnosis include infection vs. Malignancy ( history of chronic EBV infection per patient : risk of lymphoma) Abdominal tenderness related to ascites  Will check CT abdomen w/contrast  IR consult in am : keep NPO for now No indication to start abx: no signs of infection for now and he's stable Morphine prn pain   DVT prophylaxis: hold heparin awaiting procedure in am Consultants: IR Code Status: Full       Gennaro Africa M.D [facility]

## 2015-07-15 NOTE — Discharge Summary (Signed)
Physician Discharge Summary  Donald [name] PJA:[id number] DOB: [birth year] DOA: 07/14/2015  PCP: Drema Pry, DO  Admit date: 07/14/2015 Discharge date: 07/15/2015  Time spent: 35 minutes  Recommendations for Outpatient Follow-up:  1. Follow-up with Dr. Carolann Littler as an outpatient follow-up on results of pleural effusion cytology LDH and protein. 2. He also follow-up with pulmonary as an outpatient.  Discharge Diagnoses:  Principal Problem:   Pleural effusion, right Active Problems:   Pleural effusion   Discharge Condition: Stable  Diet recommendation: regular  Filed Weights   07/14/15 2306  Weight: 93.8 kg (206 lb 12.7 oz)    History of present illness:  39 year old male with no significant past medical history who was in his usual state of health until 2 weeks ago when he started having back pain especially when riding his bike. He got a CXR last week that suggested a possible infection so he was given antibiotics. But last night he could not sleep due to pain and tingling sensation in right chest. He was a little short of breath on exertion but had no chest pain.   Hospital Course:  Right pleural effusion: A CT scan of the chest with contrast was done that shows significant effusion compressing the right lung, pulmonary and critical care was consulted who recommended a thoracocentesis protein, LDH, glucose and cytology were sent. The patient insisted on being discharged today he was started on oral narcotics, repeat a chest x-ray after thoracocentesis was done that showed no pneumothorax or he was discharged and will follow up with his primary care doctor and pulmonary as an outpatient.   Procedures:  CXR  Consultations:  pulmonary  Discharge Exam: Filed Vitals:   07/15/15 0400  BP: 153/70  Pulse: 73  Temp: 98.3 F (36.8 C)  Resp: 20    General: see progress note  Discharge Instructions   Discharge Instructions    Diet - low sodium heart healthy    Complete  by:  As directed      Increase activity slowly    Complete by:  As directed           Current Discharge Medication List    START taking these medications   Details  HYDROcodone-acetaminophen (NORCO) 7.5-325 MG per tablet Take 1-2 tablets by mouth every 6 (six) hours as needed for moderate pain. Qty: 60 tablet, Refills: 0      CONTINUE these medications which have NOT CHANGED   Details  gabapentin (NEURONTIN) 300 MG capsule Take 1 capsule (300 mg total) by mouth at bedtime. Qty: 60 capsule, Refills: 1      STOP taking these medications     azithromycin (ZITHROMAX) 250 MG tablet      levofloxacin (LEVAQUIN) 500 MG tablet        No Known Allergies    The results of significant diagnostics from this hospitalization (including imaging, microbiology, ancillary and laboratory) are listed below for reference.    Significant Diagnostic Studies: Dg Chest 2 View  07/14/2015   CLINICAL DATA:  Right-sided chest pain for 2-3 days. Shortness of breath.  EXAM: CHEST  2 VIEW  COMPARISON:  07/12/2015  FINDINGS: Heart size is probably normal. Suspect lung opacity at the right base in addition to the elevated hemidiaphragm. Opacity at the right lung base may be related to subpulmonic effusion. Overall the appearance is stable since previous exam. There is a trace left pleural effusion. No pulmonary edema.  IMPRESSION: Persistent significant opacity at the right lung base. Consider  right lateral decubitus view to determine component of layering pleural effusion.   Electronically Signed   By: Nolon Nations M.D.   On: 07/14/2015 16:56   Dg Chest 2 View  07/12/2015   CLINICAL DATA:  Anterior chest pain for 1 week with cough  EXAM: CHEST  2 VIEW  COMPARISON:  None.  FINDINGS: There is mild elevation the right hemidiaphragm. There is evidence suggesting consolidation in the medial right base. Lungs elsewhere clear. Heart size and pulmonary vascularity are normal. No adenopathy. No bone lesions.   IMPRESSION: Evidence of a degree of consolidation in the medial right base. There is elevation of the right hemidiaphragm. Lungs elsewhere clear. Cardiac silhouette within normal limits.  Followup PA and lateral chest radiographs recommended in 3-4 weeks following trial of antibiotic therapy to ensure resolution and exclude underlying malignancy.   Electronically Signed   By: Lowella Grip III M.D.   On: 07/12/2015 15:52   Ct Angio Chest Pe W/cm &/or Wo Cm  07/14/2015   CLINICAL DATA:  Right chest pain  EXAM: CT ANGIOGRAPHY CHEST WITH CONTRAST  TECHNIQUE: Multidetector CT imaging of the chest was performed using the standard protocol during bolus administration of intravenous contrast. Multiplanar CT image reconstructions and MIPs were obtained to evaluate the vascular anatomy.  CONTRAST:  140mL OMNIPAQUE IOHEXOL 350 MG/ML SOLN  COMPARISON:  Chest x-ray 07/14/2015  FINDINGS: Negative for pulmonary embolism. Negative for aortic dissection or aneurysm  Moderately large right pleural effusion with compressive atelectasis of the right lower lobe. Atelectasis also present in the right middle lobe. Partial collapse of the right upper lobe medially adjacent to the mediastinum. This extends into the right upper lobe laterally. Small left pleural effusion. No significant infiltrate on the left.  No adenopathy is present in the mediastinum.  No acute bony abnormality  In the upper abdomen, there is mild to moderate ascites. This could be due to liver disease. There is fluid around the liver and spleen. The spleen is not enlarged. Limited imaging of the pancreas and kidneys are grossly normal. There is soft tissue infiltration in the omentum. Carcinoma not excluded.  Review of the MIP images confirms the above findings.  IMPRESSION: Negative for pulmonary embolism  Moderate right pleural effusion with compressive atelectasis in the right lower lobe. Additional atelectasis in the right middle lobe and right upper lobe. No  definite mass or pneumonia however infection and tumor are in the differential. There is a small left effusion  Concerning findings in the abdomen. There is ascites around the liver and spleen. There is soft tissue density involving the omentum which can be seen with carcinoma. Further imaging of the abdomen with CT abdomen pelvis with contrast is suggested.   Electronically Signed   By: Franchot Gallo M.D.   On: 07/14/2015 18:28    Microbiology: No results found for this or any previous visit (from the past 240 hour(s)).   Labs: Basic Metabolic Panel:  Recent Labs Lab 07/12/15 1234 07/14/15 1644 07/15/15 0440  NA 139 137 135  K 3.6 4.0 3.9  CL 103 101 99*  CO2 27 26 24   GLUCOSE 78 97 67  BUN 18 20 17   CREATININE 1.10 1.23 1.22  CALCIUM 8.5 8.5* 8.0*   Liver Function Tests:  Recent Labs Lab 07/12/15 1234 07/15/15 0440 07/15/15 1050  AST 45* 66*  --   ALT 32 44  --   ALKPHOS 57 53  --   BILITOT 0.3 0.9  --  PROT 6.0 5.7* 5.7*  ALBUMIN 3.5 3.1*  --    No results for input(s): LIPASE, AMYLASE in the last 168 hours. No results for input(s): AMMONIA in the last 168 hours. CBC:  Recent Labs Lab 07/12/15 1234 07/14/15 1644 07/15/15 0440  WBC 7.0 7.0 7.6  NEUTROABS 4.4  --  4.3  HGB 15.3 15.7 15.0  HCT 45.0 46.1 44.7  MCV 87.6 85.2 86.5  PLT 220.0 216 222   Cardiac Enzymes: No results for input(s): CKTOTAL, CKMB, CKMBINDEX, TROPONINI in the last 168 hours. BNP: BNP (last 3 results) No results for input(s): BNP in the last 8760 hours.  ProBNP (last 3 results) No results for input(s): PROBNP in the last 8760 hours.  CBG: No results for input(s): GLUCAP in the last 168 hours.     Signed:  Charlynne Cousins  [facility] 07/15/2015, 1:33 PM

## 2015-07-15 NOTE — Consult Note (Signed)
Name: Donald [name] MRN: [id number] DOB: Apr 11, 1976    ADMISSION DATE:  07/14/2015 CONSULTATION DATE:  07/15/15  REFERRING MD :  Venetia Constable Ortiz/ [facility]  CHIEF COMPLAINT:  Back pain  BRIEF PATIENT DESCRIPTION: 3 yoM nonsmoker, previously well. Presented with back pain, R pleural effusion, ascites. PCCM consulted, requesting thoracentesis.  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 Hx EB virus infection 3 years ago with fatigue. Intermittent back pain probably last few months. Now 2 weeks ago onset pain mid-thoracic spine after bike ride. Some R lateral costal margin pain and dyspnea leaning over, hiccups, vague abdominal discomfort.  CXR  9/1  noted elevation R base consolidation - zpak CTa now shows moderate R pleural effusion, atelectasis RML, and abdominal ascites with soft tissue around the omentum concerning for possible Ca.   PAST MEDICAL HISTORY :   has no past medical history on file.  has past surgical history that includes Tonsillectomy and Appendectomy.   Prior to Admission medications   Medication Sig Start Date End Date Taking? Authorizing Provider  azithromycin (ZITHROMAX) 250 MG tablet Take as directed 07/13/15   Eulas Post, MD  gabapentin (NEURONTIN) 300 MG capsule Take 1 capsule (300 mg total) by mouth at bedtime. 07/13/15   Eulas Post, MD  levofloxacin (LEVAQUIN) 500 MG tablet Take 1 tablet (500 mg total) by mouth daily. 07/13/15   Eulas Post, MD   No Known Allergies  FAMILY HISTORY:  family history includes Heart failure in his father. SOCIAL HISTORY:  reports that he has never smoked. He does not have any smokeless tobacco history on file. He reports that he does not drink alcohol.  REVIEW OF SYSTEMS:  += pos Constitutional: Negative for fever, chills, weight loss, malaise/fatigue and diaphoresis.  HENT: Negative for  hearing loss, ear pain, nosebleeds, congestion, sore throat, neck pain, tinnitus and ear discharge.   Eyes: Negative for blurred vision, double vision, photophobia, pain, discharge and redness.  Respiratory: + cough, hemoptysis, sputum production, shortness of breath, wheezing and stridor.   Cardiovascular: + chest pain, palpitations, orthopnea, claudication, leg swelling and PND.  Gastrointestinal: + hiccups, Negative for heartburn, nausea, vomiting, abdominal pain, diarrhea, constipation, blood in stool and melena.  Genitourinary: Negative for dysuria, urgency, frequency, hematuria and flank pain.  Musculoskeletal: Negative for myalgias,+ back pain, joint pain and falls.  Skin: Negative for itching and rash.  Neurological: Negative for dizziness, tingling, tremors, sensory change, speech change, focal weakness, seizures, loss of consciousness, weakness and headaches.  Endo/Heme/Allergies: Negative for environmental allergies and polydipsia. Does not bruise/bleed easily.  SUBJECTIVE:   VITAL SIGNS: Temp:  [98.2 F (36.8 C)-98.5 F (36.9 C)] 98.3 F (36.8 C) (09/04 0400) Pulse Rate:  [53-96] 73 (09/04 0400) Resp:  [18-24] 20 (09/04 0400) BP: (128-153)/(70-78) 153/70 mmHg (09/04 0400) SpO2:  [93 %-98 %] 96 % (09/04 0400) Weight:  [93.8 kg (206 lb 12.7 oz)] 93.8 kg (206 lb 12.7 oz) (09/03 2306)  PHYSICAL EXAMINATION: General:  Fit appearing man in some discomfort, lying in bed, cooperative Neuro:  Non-focal, oriented Nodes: none found HEENT:  Mucosa clear, speech clear, gross vision and hearing intact, no stridor  Cardiovascular: RRR, no M/G/R, no jvd or peripheral edema Lungs:  Dull R lower 2/3 of chest, no rub, clear on L Abdomen: No HSM, non-tender Musculoskeletal: muscular Skin:  No rash or bruising  Recent Labs Lab 07/12/15 1234 07/14/15 1644 07/15/15 0440  NA 139 137 135  K 3.6 4.0 3.9  CL 103 101 99*  CO2 27 26 24   BUN 18 20 17   CREATININE 1.10 1.23 1.22  GLUCOSE  78 97 67    Recent Labs Lab 07/12/15 1234 07/14/15 1644 07/15/15 0440  HGB 15.3 15.7 15.0  HCT 45.0 46.1 44.7  WBC 7.0 7.0 7.6  PLT 220.0 216 222   Dg Chest 2 View  07/14/2015   CLINICAL DATA:  Right-sided chest pain for 2-3 days. Shortness of breath.  EXAM: CHEST  2 VIEW  COMPARISON:  07/12/2015  FINDINGS: Heart size is probably normal. Suspect lung opacity at the right base in addition to the elevated hemidiaphragm. Opacity at the right lung base may be related to subpulmonic effusion. Overall the appearance is stable since previous exam. There is a trace left pleural effusion. No pulmonary edema.  IMPRESSION: Persistent significant opacity at the right lung base. Consider right lateral decubitus view to determine component of layering pleural effusion.   Electronically Signed   By: Nolon Nations M.D.   On: 07/14/2015 16:56   Ct Angio Chest Pe W/cm &/or Wo Cm  07/14/2015   CLINICAL DATA:  Right chest pain  EXAM: CT ANGIOGRAPHY CHEST WITH CONTRAST  TECHNIQUE: Multidetector CT imaging of the chest was performed using the standard protocol during bolus administration of intravenous contrast. Multiplanar CT image reconstructions and MIPs were obtained to evaluate the vascular anatomy.  CONTRAST:  144mL OMNIPAQUE IOHEXOL 350 MG/ML SOLN  COMPARISON:  Chest x-ray 07/14/2015  FINDINGS: Negative for pulmonary embolism. Negative for aortic dissection or aneurysm  Moderately large right pleural effusion with compressive atelectasis of the right lower lobe. Atelectasis also present in the right middle lobe. Partial collapse of the right upper lobe medially adjacent to the mediastinum. This extends into the right upper lobe laterally. Small left pleural effusion. No significant infiltrate on the left.  No adenopathy is present in the mediastinum.  No acute bony abnormality  In the upper abdomen, there is mild to moderate ascites. This could be due to liver disease. There is fluid around the liver and spleen.  The spleen is not enlarged. Limited imaging of the pancreas and kidneys are grossly normal. There is soft tissue infiltration in the omentum. Carcinoma not excluded.  Review of the MIP images confirms the above findings.  IMPRESSION: Negative for pulmonary embolism  Moderate right pleural effusion with compressive atelectasis in the right lower lobe. Additional atelectasis in the right middle lobe and right upper lobe. No definite mass or pneumonia however infection and tumor are in the differential. There is a small left effusion  Concerning findings in the abdomen. There is ascites around the liver and spleen. There is soft tissue density involving the omentum which can be seen with carcinoma. Further imaging of the abdomen with CT abdomen pelvis with contrast is suggested.   Electronically Signed   By: Franchot Gallo M.D.   On: 07/14/2015 18:28   I reviewed CTa and cxr images from 9/3  ASSESSMENT / PLAN:  Pleural effusion R- This is probably related to back pain and to abdominal ascites. In this Erasto Sleight man, cancer is the first concern. Doubt recent house renovation wearing respirator mask is related. I have recommended thoracentesis for dx and hopefully some palliation of pain, but anticipate he will need pain meds and early return to his PCP, Dr Elease Hashimoto.  CD Floriene Jeschke, MD Pulmonary and Critical Care  [place] Pager: [phone]  07/15/2015, 12:51 PM

## 2015-07-15 NOTE — Progress Notes (Signed)
Patient refused PM lab draw.  RN notified.  Patient resting in bed,call bell within reach.  RN will continue to monitor

## 2015-07-15 NOTE — Progress Notes (Signed)
[facility] PROGRESS NOTE Assessment/Plan: Pleural effusion, right CT scan of the chest with contrast shows a significant effusion with compression of the right lung. IR has been consulted for thoracocentesis will send, protein, LDH, glucose and cell cytology. Safe a tube for additional testing if needed. His TSH was mildly elevated, has remained afebrile with no leukocytosis. Check a 2-D echo. Consult pulmonary to follow-up on the labs as an outpatient.    DVT prophylaxis: resume heparin  Code Status: Full    Consultants: IR  Procedures:  thoracocentesis  Antibiotics:  None  HPI/Subjective: Still feel mildly short of breath.  Objective: Filed Vitals:   07/14/15 2058 07/14/15 2204 07/14/15 2306 07/15/15 0400  BP: 130/76 128/70 139/77 153/70  Pulse: 68 59 53 73  Temp: 98.5 F (36.9 C)  98.5 F (36.9 C) 98.3 F (36.8 C)  TempSrc: Oral  Oral Oral  Resp: 22 24 20 20   Height:   6' (1.829 m)   Weight:   93.8 kg (206 lb 12.7 oz)   SpO2: 96% 94% 98% 96%   No intake or output data in the 24 hours ending 07/15/15 0946 Filed Weights   07/14/15 2306  Weight: 93.8 kg (206 lb 12.7 oz)    Exam:  General: Alert, awake, oriented x3, in no acute distress.  HEENT: No bruits, no goiter.  Heart: Regular rate and rhythm. Lungs: Good air movement, decrease sound on the right.  Abdomen: Soft, nontender, nondistended, positive bowel sounds.  Neuro: Grossly intact, nonfocal.   Data Reviewed: Basic Metabolic Panel:  Recent Labs Lab 07/12/15 1234 07/14/15 1644 07/15/15 0440  NA 139 137 135  K 3.6 4.0 3.9  CL 103 101 99*  CO2 27 26 24   GLUCOSE 78 97 67  BUN 18 20 17   CREATININE 1.10 1.23 1.22  CALCIUM 8.5 8.5* 8.0*   Liver Function Tests:  Recent Labs Lab 07/12/15 1234 07/15/15 0440  AST 45* 66*  ALT 32 44  ALKPHOS 57 53  BILITOT 0.3 0.9  PROT 6.0 5.7*  ALBUMIN 3.5 3.1*   No results for input(s): LIPASE, AMYLASE in the last 168 hours. No results  for input(s): AMMONIA in the last 168 hours. CBC:  Recent Labs Lab 07/12/15 1234 07/14/15 1644 07/15/15 0440  WBC 7.0 7.0 7.6  NEUTROABS 4.4  --  4.3  HGB 15.3 15.7 15.0  HCT 45.0 46.1 44.7  MCV 87.6 85.2 86.5  PLT 220.0 216 222   Cardiac Enzymes: No results for input(s): CKTOTAL, CKMB, CKMBINDEX, TROPONINI in the last 168 hours. BNP (last 3 results) No results for input(s): BNP in the last 8760 hours.  ProBNP (last 3 results) No results for input(s): PROBNP in the last 8760 hours.  CBG: No results for input(s): GLUCAP in the last 168 hours.  No results found for this or any previous visit (from the past 240 hour(s)).   Studies: Dg Chest 2 View  07/14/2015   CLINICAL DATA:  Right-sided chest pain for 2-3 days. Shortness of breath.  EXAM: CHEST  2 VIEW  COMPARISON:  07/12/2015  FINDINGS: Heart size is probably normal. Suspect lung opacity at the right base in addition to the elevated hemidiaphragm. Opacity at the right lung base may be related to subpulmonic effusion. Overall the appearance is stable since previous exam. There is a trace left pleural effusion. No pulmonary edema.  IMPRESSION: Persistent significant opacity at the right lung base. Consider right lateral decubitus view to determine component of layering pleural effusion.  Electronically Signed   By: Nolon Nations M.D.   On: 07/14/2015 16:56   Ct Angio Chest Pe W/cm &/or Wo Cm  07/14/2015   CLINICAL DATA:  Right chest pain  EXAM: CT ANGIOGRAPHY CHEST WITH CONTRAST  TECHNIQUE: Multidetector CT imaging of the chest was performed using the standard protocol during bolus administration of intravenous contrast. Multiplanar CT image reconstructions and MIPs were obtained to evaluate the vascular anatomy.  CONTRAST:  153mL OMNIPAQUE IOHEXOL 350 MG/ML SOLN  COMPARISON:  Chest x-ray 07/14/2015  FINDINGS: Negative for pulmonary embolism. Negative for aortic dissection or aneurysm  Moderately large right pleural effusion with  compressive atelectasis of the right lower lobe. Atelectasis also present in the right middle lobe. Partial collapse of the right upper lobe medially adjacent to the mediastinum. This extends into the right upper lobe laterally. Small left pleural effusion. No significant infiltrate on the left.  No adenopathy is present in the mediastinum.  No acute bony abnormality  In the upper abdomen, there is mild to moderate ascites. This could be due to liver disease. There is fluid around the liver and spleen. The spleen is not enlarged. Limited imaging of the pancreas and kidneys are grossly normal. There is soft tissue infiltration in the omentum. Carcinoma not excluded.  Review of the MIP images confirms the above findings.  IMPRESSION: Negative for pulmonary embolism  Moderate right pleural effusion with compressive atelectasis in the right lower lobe. Additional atelectasis in the right middle lobe and right upper lobe. No definite mass or pneumonia however infection and tumor are in the differential. There is a small left effusion  Concerning findings in the abdomen. There is ascites around the liver and spleen. There is soft tissue density involving the omentum which can be seen with carcinoma. Further imaging of the abdomen with CT abdomen pelvis with contrast is suggested.   Electronically Signed   By: Franchot Gallo M.D.   On: 07/14/2015 18:28    Scheduled Meds:  Continuous Infusions: . sodium chloride 100 mL/hr at 07/15/15 0056    Time Spent: 25 min   Charlynne Cousins  [facility] Pager [phone]. If 7PM-7AM, please contact night-coverage at [email or web address], password [facility] 07/15/2015, 9:46 AM  LOS: 1 day

## 2015-07-16 ENCOUNTER — Other Ambulatory Visit (HOSPITAL_COMMUNITY): Payer: BLUE CROSS/BLUE SHIELD

## 2015-07-16 ENCOUNTER — Encounter (HOSPITAL_COMMUNITY): Payer: Self-pay | Admitting: Hematology

## 2015-07-16 DIAGNOSIS — J90 Pleural effusion, not elsewhere classified: Principal | ICD-10-CM

## 2015-07-16 DIAGNOSIS — IMO0002 Reserved for concepts with insufficient information to code with codable children: Secondary | ICD-10-CM | POA: Insufficient documentation

## 2015-07-16 DIAGNOSIS — E790 Hyperuricemia without signs of inflammatory arthritis and tophaceous disease: Secondary | ICD-10-CM

## 2015-07-16 DIAGNOSIS — E883 Tumor lysis syndrome: Secondary | ICD-10-CM | POA: Insufficient documentation

## 2015-07-16 DIAGNOSIS — R229 Localized swelling, mass and lump, unspecified: Secondary | ICD-10-CM

## 2015-07-16 DIAGNOSIS — R740 Nonspecific elevation of levels of transaminase and lactic acid dehydrogenase [LDH]: Secondary | ICD-10-CM

## 2015-07-16 LAB — SEDIMENTATION RATE: SED RATE: 6 mm/h (ref 0–16)

## 2015-07-16 LAB — COMPREHENSIVE METABOLIC PANEL
ALT: 48 U/L (ref 17–63)
ANION GAP: 14 (ref 5–15)
AST: 76 U/L — ABNORMAL HIGH (ref 15–41)
Albumin: 3 g/dL — ABNORMAL LOW (ref 3.5–5.0)
Alkaline Phosphatase: 49 U/L (ref 38–126)
BUN: 13 mg/dL (ref 6–20)
CHLORIDE: 104 mmol/L (ref 101–111)
CO2: 20 mmol/L — AB (ref 22–32)
Calcium: 8.2 mg/dL — ABNORMAL LOW (ref 8.9–10.3)
Creatinine, Ser: 1.18 mg/dL (ref 0.61–1.24)
GFR calc non Af Amer: >60 mL/min (ref >60)
Glucose, Bld: 80 mg/dL (ref 65–99)
POTASSIUM: 4.3 mmol/L (ref 3.5–5.1)
SODIUM: 138 mmol/L (ref 135–145)
Total Bilirubin: 0.9 mg/dL (ref 0.3–1.2)
Total Protein: 5.4 g/dL — ABNORMAL LOW (ref 6.5–8.1)

## 2015-07-16 LAB — C-REACTIVE PROTEIN: CRP: 4.3 mg/dL — AB (ref <1.0)

## 2015-07-16 LAB — PHOSPHORUS: PHOSPHORUS: 2.7 mg/dL (ref 2.5–4.6)

## 2015-07-16 LAB — URIC ACID: URIC ACID, SERUM: 12.5 mg/dL — AB (ref 4.4–7.6)

## 2015-07-16 LAB — SAVE SMEAR

## 2015-07-16 LAB — LACTATE DEHYDROGENASE: LDH: 1050 U/L — AB (ref 98–192)

## 2015-07-16 MED ORDER — ALLOPURINOL 300 MG PO TABS
300.0000 mg | ORAL_TABLET | Freq: Every day | ORAL | Status: DC
  Administered 2015-07-16 – 2015-07-18 (×3): 300 mg via ORAL
  Filled 2015-07-16 (×5): qty 1

## 2015-07-16 MED ORDER — SODIUM BICARBONATE 8.4 % IV SOLN
INTRAVENOUS | Status: DC
  Administered 2015-07-16 – 2015-07-18 (×3): via INTRAVENOUS
  Filled 2015-07-16 (×5): qty 100

## 2015-07-16 MED ORDER — KETOROLAC TROMETHAMINE 15 MG/ML IJ SOLN
15.0000 mg | Freq: Four times a day (QID) | INTRAMUSCULAR | Status: AC | PRN
  Administered 2015-07-16 – 2015-07-18 (×2): 15 mg via INTRAVENOUS
  Filled 2015-07-16: qty 1

## 2015-07-16 MED ORDER — KETOROLAC TROMETHAMINE 15 MG/ML IJ SOLN
INTRAMUSCULAR | Status: AC
  Filled 2015-07-16: qty 1

## 2015-07-16 NOTE — Progress Notes (Addendum)
[facility] PROGRESS NOTE Assessment/Plan: Pleural effusion, right/abdominal mass: CT scan of the chest with contrast shows a significant effusion with compression of the right lung. Pulmonary perform thoracocentesis. Fluid exudative bloodLDH is 1033. Oncology to follow, I consulted IR for possible biopsy.   DVT prophylaxis: resume heparin  Code Status: Full    Consultants: IR  Procedures:  thoracocentesis  Antibiotics:  None  HPI/Subjective: Shortness of breath is improved.  Objective: Filed Vitals:   07/15/15 0400 07/15/15 1500 07/15/15 1943 07/16/15 0430  BP: 153/70 139/81 140/74 120/60  Pulse: 73 78 82 90  Temp: 98.3 F (36.8 C) 98.4 F (36.9 C) 98 F (36.7 C) 98.8 F (37.1 C)  TempSrc: Oral Oral Oral Oral  Resp: 20 18 18 18   Height:      Weight:      SpO2: 96% 96% 96% 93%    Intake/Output Summary (Last 24 hours) at 07/16/15 0824 Last data filed at 07/15/15 1500  Gross per 24 hour  Intake    120 ml  Output      0 ml  Net    120 ml   Filed Weights   07/14/15 2306  Weight: 93.8 kg (206 lb 12.7 oz)    Exam:  General: Alert, awake, oriented x3, in no acute distress.  HEENT: No bruits, no goiter.  Heart: Regular rate and rhythm. Lungs: Good air movement, clear Abdomen: Soft, nontender, nondistended, positive bowel sounds.  Neuro: Grossly intact, nonfocal.   Data Reviewed: Basic Metabolic Panel:  Recent Labs Lab 07/12/15 1234 07/14/15 1644 07/15/15 0440  NA 139 137 135  K 3.6 4.0 3.9  CL 103 101 99*  CO2 27 26 24   GLUCOSE 78 97 67  BUN 18 20 17   CREATININE 1.10 1.23 1.22  CALCIUM 8.5 8.5* 8.0*   Liver Function Tests:  Recent Labs Lab 07/12/15 1234 07/15/15 0440 07/15/15 1050  AST 45* 66*  --   ALT 32 44  --   ALKPHOS 57 53  --   BILITOT 0.3 0.9  --   PROT 6.0 5.7* 5.7*  ALBUMIN 3.5 3.1*  --    No results for input(s): LIPASE, AMYLASE in the last 168 hours. No results for input(s): AMMONIA in the last 168  hours. CBC:  Recent Labs Lab 07/12/15 1234 07/14/15 1644 07/15/15 0440  WBC 7.0 7.0 7.6  NEUTROABS 4.4  --  4.3  HGB 15.3 15.7 15.0  HCT 45.0 46.1 44.7  MCV 87.6 85.2 86.5  PLT 220.0 216 222   Cardiac Enzymes: No results for input(s): CKTOTAL, CKMB, CKMBINDEX, TROPONINI in the last 168 hours. BNP (last 3 results) No results for input(s): BNP in the last 8760 hours.  ProBNP (last 3 results) No results for input(s): PROBNP in the last 8760 hours.  CBG: No results for input(s): GLUCAP in the last 168 hours.  Recent Results (from the past 240 hour(s))  Gram stain     Status: None   Collection Time: 07/15/15  3:23 PM  Result Value Ref Range Status   Specimen Description FLUID PLEURAL  Final   Special Requests NONE  Final   Gram Stain   Final    ABUNDANT WBC PRESENT,BOTH PMN AND MONONUCLEAR NO ORGANISMS SEEN GRAM STAIN REVIEWED-AGREE WITH RESULT K WOOLEN    Report Status 07/15/2015 FINAL  Final     Studies: Dg Chest 2 View  07/15/2015   CLINICAL DATA:  Followup right pleural effusion, status post right thoracentesis.  EXAM: CHEST  2 VIEW  COMPARISON:  Yesterday.  FINDINGS: Stable right upper companion rib shadows. No pneumothorax seen. Significantly decreased right pleural fluid. The interstitial markings remain mildly prominent. Normal sized heart. Interval mild left lower lobe linear density. Unremarkable bones.  IMPRESSION: 1. No pneumothorax following right thoracentesis. 2. Significantly less right pleural fluid. 3. Interval mild left lower lobe atelectasis. 4. Stable mild chronic interstitial lung disease.   Electronically Signed   By: Claudie Revering M.D.   On: 07/15/2015 17:21   Dg Chest 2 View  07/14/2015   CLINICAL DATA:  Right-sided chest pain for 2-3 days. Shortness of breath.  EXAM: CHEST  2 VIEW  COMPARISON:  07/12/2015  FINDINGS: Heart size is probably normal. Suspect lung opacity at the right base in addition to the elevated hemidiaphragm. Opacity at the right lung  base may be related to subpulmonic effusion. Overall the appearance is stable since previous exam. There is a trace left pleural effusion. No pulmonary edema.  IMPRESSION: Persistent significant opacity at the right lung base. Consider right lateral decubitus view to determine component of layering pleural effusion.   Electronically Signed   By: Nolon Nations M.D.   On: 07/14/2015 16:56   Ct Angio Chest Pe W/cm &/or Wo Cm  07/14/2015   CLINICAL DATA:  Right chest pain  EXAM: CT ANGIOGRAPHY CHEST WITH CONTRAST  TECHNIQUE: Multidetector CT imaging of the chest was performed using the standard protocol during bolus administration of intravenous contrast. Multiplanar CT image reconstructions and MIPs were obtained to evaluate the vascular anatomy.  CONTRAST:  148mL OMNIPAQUE IOHEXOL 350 MG/ML SOLN  COMPARISON:  Chest x-ray 07/14/2015  FINDINGS: Negative for pulmonary embolism. Negative for aortic dissection or aneurysm  Moderately large right pleural effusion with compressive atelectasis of the right lower lobe. Atelectasis also present in the right middle lobe. Partial collapse of the right upper lobe medially adjacent to the mediastinum. This extends into the right upper lobe laterally. Small left pleural effusion. No significant infiltrate on the left.  No adenopathy is present in the mediastinum.  No acute bony abnormality  In the upper abdomen, there is mild to moderate ascites. This could be due to liver disease. There is fluid around the liver and spleen. The spleen is not enlarged. Limited imaging of the pancreas and kidneys are grossly normal. There is soft tissue infiltration in the omentum. Carcinoma not excluded.  Review of the MIP images confirms the above findings.  IMPRESSION: Negative for pulmonary embolism  Moderate right pleural effusion with compressive atelectasis in the right lower lobe. Additional atelectasis in the right middle lobe and right upper lobe. No definite mass or pneumonia however  infection and tumor are in the differential. There is a small left effusion  Concerning findings in the abdomen. There is ascites around the liver and spleen. There is soft tissue density involving the omentum which can be seen with carcinoma. Further imaging of the abdomen with CT abdomen pelvis with contrast is suggested.   Electronically Signed   By: Franchot Gallo M.D.   On: 07/14/2015 18:28   Ct Abdomen Pelvis W Contrast  07/15/2015   CLINICAL DATA:  39 year old male with ascites and potential omental disease noted on prior chest CT. Follow-up abdominal CT scan.  EXAM: CT ABDOMEN AND PELVIS WITH CONTRAST  TECHNIQUE: Multidetector CT imaging of the abdomen and pelvis was performed using the standard protocol following bolus administration of intravenous contrast.  CONTRAST:  1107mL OMNIPAQUE IOHEXOL 300 MG/ML  SOLN  COMPARISON:  CT of the  chest 07/14/2015. CT of the pelvis 03/03/2008.  FINDINGS: Lower chest: Small bilateral pleural effusions (left greater than right). Subsegmental atelectasis in the posterior aspect of the right lower lobe.  Hepatobiliary: No cystic or solid hepatic lesion identified. No intra or extrahepatic biliary ductal dilatation. High attenuation material filling the gallbladder, presumably vicarious excretion of contrast material from the recent chest CT.  Pancreas: No pancreatic mass. No pancreatic ductal dilatation. No pancreatic or peripancreatic fluid or inflammatory changes.  Spleen: Unremarkable.  Adrenals/Urinary Tract: Bilateral adrenal glands and bilateral kidneys are unremarkable in appearance. No hydroureteronephrosis. Urinary bladder is unremarkable in appearance.  Stomach/Bowel: In the mid small bowel (like mid to distal jejunum) there is a profoundly asymmetrically thickened loop of bowel best demonstrated on image 56 of series 2, where the wall measures up to 22 mm in thickness medially. Adjacent to this there are soft tissue masses extending into the root of the small  bowel mesentery, largest of which measures 2.7 x 3.3 cm (image 47 of series 2), likely malignant lymph nodes. The appendix is not confidently identified. No pathologic dilatation of small bowel or colon. Stomach is grossly normal in appearance.  Vascular/Lymphatic: No significant atherosclerotic disease, aneurysm or dissection identified in the abdominal or pelvic vasculature. Circumaortic left renal vein (normal anatomical variant) incidentally noted. Probable mesenteric lymphadenopathy, as discussed above.  Reproductive: Prostate gland and seminal vesicles are unremarkable in appearance. Inguinal canals and visualized portions of the scrotum were grossly unremarkable in appearance. No lymphadenopathy noted along the gonadal drainage system.  Other: Large amount of abnormal enhancing soft tissue throughout the peritoneal cavity, most notable in the omentum, particularly in the right upper quadrant where there is bulky omental caking. Small volume of presumably malignant ascites. No pneumoperitoneum. Some abnormal soft tissue is extending through the anterior abdominal wall into the periumbilical region (image 53 of series 2).  Musculoskeletal: There are no aggressive appearing lytic or blastic lesions noted in the visualized portions of the skeleton.  IMPRESSION: 1. Widespread intraperitoneal malignancy, as demonstrated by extensive peritoneal nodularity and enhancement, widespread omental caking, likely malignant ascites and extensive mesenteric lymphadenopathy. The exact source of malignancy is uncertain, however, given the focally thickened loop of small bowel (likely jejunal) in the left side of the central abdomen (image 56 of series 2), a primary small bowel malignancy is suspected. 2. Small bilateral pleural effusions (left greater than right). Right-sided pleural effusion significantly decreased following thoracentesis. Resolving subsegmental atelectasis in the right lower lobe. 3. Additional incidental  findings, as above. These results were called by telephone at the time of interpretation on 07/15/2015 at 5:33 pm to Dr. Olevia Bowens, who verbally acknowledged these results.   Electronically Signed   By: Vinnie Langton M.D.   On: 07/15/2015 17:34    Scheduled Meds: . ondansetron (ZOFRAN) IV  4 mg Intravenous Once   Continuous Infusions: . sodium chloride 10 mL/hr at 07/15/15 1023    Time Spent: 25 min   Charlynne Cousins  [facility] Pager [phone]. If 7PM-7AM, please contact night-coverage at [email or web address], password [facility] 07/16/2015, 8:24 AM  LOS: 2 days

## 2015-07-16 NOTE — Consult Note (Signed)
[name]    HEMATOLOGY/ONCOLOGY CONSULTATION NOTE  Date of Service: 07/16/2015  Patient Care Team: [name], DO as PCP - General (Internal Medicine) .Charlynne Cousins, MD  CHIEF COMPLAINTS/PURPOSE OF CONSULTATION:  Right-sided pleural effusion and abdominal distention with jejunal mass, omental masses and LNadenopathy  HISTORY OF PRESENTING ILLNESS:   Donald [name] is a wonderful 39 y.o. male who has been referred to me by .Charlynne Cousins, MD for evaluation and management of newly noted right pleural effusion with elevated LDH, elevated serum LDH, and jejunal mass with omental thickening and abdominal lymphadenopathy.  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 He bikes more than 100 miles a week.  He notes that he was in absolutely perfect health until about 3 years ago when he developed an EBV infection which he reports was characterized by generalized lymphadenopathy, fevers, chills, elevated thyroid levels and other constitutional symptoms. He notes that it was also causing some chronic fatigue for which he has been getting cares from an alternative medicine practitioner in [place]. He also has been using some other over-the-counter alternative medicines including the d-ribose and other preparations which he notes might be EBV suppressive.  He reports that over the last couple of months his back has felt weaker and uncomfortable and over the last 2-3 weeks he noted increasing back pains and then more recently right sided chest pains and difficulty breathing. He reported to his primary care physician a couple of days ago and had a chest x-ray that he told might be due to pneumonia and was prescribed antibiotics. 2 days later he noted that his shortness of breath was getting worse and his pain was making him uncomfortable and therefore he came to the emergency room  at [facility] for further evaluation and management.  Patient had a CTA of the chest which showed no evidence of pulmonary embolism but he was noted to have moderately large right-sided pleural effusion but was causing significant right lower lobe and right middle lobe atelectasis. Patient had a therapeutic and diagnostic thoracentesis that showed an exudative effusion with a significantly elevated LDH level of 2784 and WBC count of around 15,000/cc with nearly 80% lymphocytes and only 12% neutrophils. Pleural fluid glucose less than 20. Pleural fluid cytology pending. Patient shortness of breath improved significantly after the thoracentesis.  Patient had an elevated blood LDH level of 1050.   Since he was having symptoms of increasing abdominal distention over the last couple of weeks he also had a CT of the abdomen and pelvis which showed concerns of widespread intraperitoneal malignancy as demonstrated by extensive peritoneal nodularity and enhancement, widespread omental caking, likely malignant ascites and extensive mesenteric lymphadenopathy. A focally thickened loop of small bowel likely jejunal was also noted.  He is scheduled for an IR guided biopsy off his omental mass to confirm his diagnosis.  His uric acid levels were elevated to 12, calcium, potassium and phosphorus levels within normal limits. Renal function within normal limits. Concern for spontaneous tumor lysis syndrome.  HIV test was sent and is currently pending.   MEDICAL HISTORY:  Past Medical History  Diagnosis Date  . EBV infection 2013    Patient notes that he had an EBV infection in 2013 that left him with chronic fatigue. She also notes that he had significant lymphadenopathy and thyroiditis at the time. He has been managing his symptoms with an alternative  medicine practitioner in [place] and with other alternative medicines.  . Marijuana use, episodic     SURGICAL HISTORY: Past Surgical History    Procedure Laterality Date  . Tonsillectomy    . Appendectomy      SOCIAL HISTORY: Social History   Social History  . Marital Status: Married    Spouse Name: N/A  . Number of Children: N/A  . Years of Education: N/A   Occupational History  . Not on file.   Social History Main Topics  . Smoking status: Never Smoker   . Smokeless tobacco: Not on file  . Alcohol Use: No  . Drug Use: 1.00 per week    Special: Marijuana  . Sexual Activity: Not on file   Other Topics Concern  . 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   He has a fiance and has been in a steady relationship.      He has tried to maintain a very healthy lifestyle and cycles about 100 miles a week. He also uses a fair number of over-the-counter alternative medicines to stay healthy.    FAMILY HISTORY: Family History  Problem Relation Age of Onset  . Heart failure Father     ALLERGIES:  is allergic to fluoride preparations and sulfa antibiotics.  MEDICATIONS:  Current Facility-Administered Medications  Medication Dose Route Frequency Provider Last Rate Last Dose  . 0.9 %  sodium chloride infusion   Intravenous Continuous Charlynne Cousins, MD 10 mL/hr at 07/15/15 1023    . allopurinol (ZYLOPRIM) tablet 300 mg  300 mg Oral Daily Brunetta Genera, MD      . LORazepam (ATIVAN) tablet 1 mg  1 mg Oral Q6H PRN Charlynne Cousins, MD   1 mg at 07/16/15 0031  . morphine 2 MG/ML injection 2 mg  2 mg Intravenous Q3H PRN Gennaro Africa, MD   2 mg at 07/15/15 2234  . ondansetron (ZOFRAN) injection 4 mg  4 mg Intravenous Once Erick Colace, NP   4 mg at 07/15/15 1500  . sodium bicarbonate 100 mEq in dextrose 5 % 1,000 mL infusion   Intravenous Continuous Nikolas Casher Juleen China, MD        REVIEW OF SYSTEMS:    10 Point review of Systems was done is negative except as noted above.  PHYSICAL EXAMINATION: ECOG  PERFORMANCE STATUS: 1 - Symptomatic but completely ambulatory  .@VITALS2 @ [employer]   07/14/15 2306  Weight: 206 lb 12.7 oz (93.8 kg)   .Body mass index is 28.04 kg/(m^2).  GENERAL:alert, in no acute distress and comfortable SKIN: skin color, texture, turgor are normal, no rashes or significant lesions EYES: normal, conjunctiva are pink and non-injected, sclera clear OROPHARYNX:no exudate, no erythema and lips, buccal mucosa, and tongue normal  NECK: supple, no JVD, thyroid normal size, non-tender, without nodularity LYMPH:  no palpable lymphadenopathy in the cervical, axillary or inguinal LUNGS: clear to auscultation with normal respiratory effort HEART: regular rate & rhythm,  no murmurs and no lower extremity edema ABDOMEN: abdomen soft, non-tender, normoactive bowel sounds  Musculoskeletal: no cyanosis of digits and no clubbing  PSYCH: alert & oriented x 3 with fluent speech NEURO: no focal motor/sensory deficits  LABORATORY DATA:  I have reviewed the data as listed  . CBC Latest Ref Rng 07/15/2015 07/14/2015 07/12/2015  WBC 4.0 - 10.5 K/uL 7.6 7.0 7.0  Hemoglobin 13.0 - 17.0 g/dL  15.0 15.7 15.3  Hematocrit 39.0 - 52.0 % 44.7 46.1 45.0  Platelets 150 - 400 K/uL 222 216 220.0    . CMP Latest Ref Rng 07/15/2015 07/15/2015 07/14/2015  Glucose 65 - 99 mg/dL - 67 97  BUN 6 - 20 mg/dL - 17 20  Creatinine 0.61 - 1.24 mg/dL - 1.22 1.23  Sodium 135 - 145 mmol/L - 135 137  Potassium 3.5 - 5.1 mmol/L - 3.9 4.0  Chloride 101 - 111 mmol/L - 99(L) 101  CO2 22 - 32 mmol/L - 24 26  Calcium 8.9 - 10.3 mg/dL - 8.0(L) 8.5(L)  Total Protein 6.5 - 8.1 g/dL 5.7(L) 5.7(L) -  Total Bilirubin 0.3 - 1.2 mg/dL - 0.9 -  Alkaline Phos 38 - 126 U/L - 53 -  AST 15 - 41 U/L - 66(H) -  ALT 17 - 63 U/L - 44 -          Component     Latest Ref Rng 07/15/2015  Fluid Type-FCT      PLEURAL  Color, Fluid     YELLOW YELLOW  Appearance, Fluid     CLEAR HAZY (A)  WBC, Fluid     0 - 1000 cu mm 15456 (H)    Neutrophil Count, Fluid     0 - 25 % 12  Lymphs, Fluid      78  Monocyte-Macrophage-Serous Fluid     50 - 90 % 12 (L)  Glucose, Fluid      <20  Fluid Type-FGLU      PLEURAL  LD, Fluid     3 - 23 U/L 2784 (H)  Fluid Type-FLDH      PLEURAL  Total protein, fluid      3.7  Fluid Type-FTP      PLEURAL    RADIOGRAPHIC STUDIES: I have personally reviewed the radiological images as listed and agreed with the findings in the report. Dg Chest 2 View  07/15/2015   CLINICAL DATA:  Followup right pleural effusion, status post right thoracentesis.  EXAM: CHEST  2 VIEW  COMPARISON:  Yesterday.  FINDINGS: Stable right upper companion rib shadows. No pneumothorax seen. Significantly decreased right pleural fluid. The interstitial markings remain mildly prominent. Normal sized heart. Interval mild left lower lobe linear density. Unremarkable bones.  IMPRESSION: 1. No pneumothorax following right thoracentesis. 2. Significantly less right pleural fluid. 3. Interval mild left lower lobe atelectasis. 4. Stable mild chronic interstitial lung disease.   Electronically Signed   By: Claudie Revering M.D.   On: 07/15/2015 17:21   Dg Chest 2 View  07/14/2015   CLINICAL DATA:  Right-sided chest pain for 2-3 days. Shortness of breath.  EXAM: CHEST  2 VIEW  COMPARISON:  07/12/2015  FINDINGS: Heart size is probably normal. Suspect lung opacity at the right base in addition to the elevated hemidiaphragm. Opacity at the right lung base may be related to subpulmonic effusion. Overall the appearance is stable since previous exam. There is a trace left pleural effusion. No pulmonary edema.  IMPRESSION: Persistent significant opacity at the right lung base. Consider right lateral decubitus view to determine component of layering pleural effusion.   Electronically Signed   By: Nolon Nations M.D.   On: 07/14/2015 16:56   Dg Chest 2 View  07/12/2015   CLINICAL DATA:  Anterior chest pain for 1 week with cough  EXAM: CHEST  2 VIEW   COMPARISON:  None.  FINDINGS: There is mild elevation the right hemidiaphragm. There is  evidence suggesting consolidation in the medial right base. Lungs elsewhere clear. Heart size and pulmonary vascularity are normal. No adenopathy. No bone lesions.  IMPRESSION: Evidence of a degree of consolidation in the medial right base. There is elevation of the right hemidiaphragm. Lungs elsewhere clear. Cardiac silhouette within normal limits.  Followup PA and lateral chest radiographs recommended in 3-4 weeks following trial of antibiotic therapy to ensure resolution and exclude underlying malignancy.   Electronically Signed   By: Lowella Grip III M.D.   On: 07/12/2015 15:52   Ct Angio Chest Pe W/cm &/or Wo Cm  07/14/2015   CLINICAL DATA:  Right chest pain  EXAM: CT ANGIOGRAPHY CHEST WITH CONTRAST  TECHNIQUE: Multidetector CT imaging of the chest was performed using the standard protocol during bolus administration of intravenous contrast. Multiplanar CT image reconstructions and MIPs were obtained to evaluate the vascular anatomy.  CONTRAST:  148mL OMNIPAQUE IOHEXOL 350 MG/ML SOLN  COMPARISON:  Chest x-ray 07/14/2015  FINDINGS: Negative for pulmonary embolism. Negative for aortic dissection or aneurysm  Moderately large right pleural effusion with compressive atelectasis of the right lower lobe. Atelectasis also present in the right middle lobe. Partial collapse of the right upper lobe medially adjacent to the mediastinum. This extends into the right upper lobe laterally. Small left pleural effusion. No significant infiltrate on the left.  No adenopathy is present in the mediastinum.  No acute bony abnormality  In the upper abdomen, there is mild to moderate ascites. This could be due to liver disease. There is fluid around the liver and spleen. The spleen is not enlarged. Limited imaging of the pancreas and kidneys are grossly normal. There is soft tissue infiltration in the omentum. Carcinoma not excluded.   Review of the MIP images confirms the above findings.  IMPRESSION: Negative for pulmonary embolism  Moderate right pleural effusion with compressive atelectasis in the right lower lobe. Additional atelectasis in the right middle lobe and right upper lobe. No definite mass or pneumonia however infection and tumor are in the differential. There is a small left effusion  Concerning findings in the abdomen. There is ascites around the liver and spleen. There is soft tissue density involving the omentum which can be seen with carcinoma. Further imaging of the abdomen with CT abdomen pelvis with contrast is suggested.   Electronically Signed   By: Franchot Gallo M.D.   On: 07/14/2015 18:28   Ct Abdomen Pelvis W Contrast  07/15/2015   CLINICAL DATA:  39 year old male with ascites and potential omental disease noted on prior chest CT. Follow-up abdominal CT scan.  EXAM: CT ABDOMEN AND PELVIS WITH CONTRAST  TECHNIQUE: Multidetector CT imaging of the abdomen and pelvis was performed using the standard protocol following bolus administration of intravenous contrast.  CONTRAST:  116mL OMNIPAQUE IOHEXOL 300 MG/ML  SOLN  COMPARISON:  CT of the chest 07/14/2015. CT of the pelvis 03/03/2008.  FINDINGS: Lower chest: Small bilateral pleural effusions (left greater than right). Subsegmental atelectasis in the posterior aspect of the right lower lobe.  Hepatobiliary: No cystic or solid hepatic lesion identified. No intra or extrahepatic biliary ductal dilatation. High attenuation material filling the gallbladder, presumably vicarious excretion of contrast material from the recent chest CT.  Pancreas: No pancreatic mass. No pancreatic ductal dilatation. No pancreatic or peripancreatic fluid or inflammatory changes.  Spleen: Unremarkable.  Adrenals/Urinary Tract: Bilateral adrenal glands and bilateral kidneys are unremarkable in appearance. No hydroureteronephrosis. Urinary bladder is unremarkable in appearance.  Stomach/Bowel: In the  mid small  bowel (like mid to distal jejunum) there is a profoundly asymmetrically thickened loop of bowel best demonstrated on image 56 of series 2, where the wall measures up to 22 mm in thickness medially. Adjacent to this there are soft tissue masses extending into the root of the small bowel mesentery, largest of which measures 2.7 x 3.3 cm (image 47 of series 2), likely malignant lymph nodes. The appendix is not confidently identified. No pathologic dilatation of small bowel or colon. Stomach is grossly normal in appearance.  Vascular/Lymphatic: No significant atherosclerotic disease, aneurysm or dissection identified in the abdominal or pelvic vasculature. Circumaortic left renal vein (normal anatomical variant) incidentally noted. Probable mesenteric lymphadenopathy, as discussed above.  Reproductive: Prostate gland and seminal vesicles are unremarkable in appearance. Inguinal canals and visualized portions of the scrotum were grossly unremarkable in appearance. No lymphadenopathy noted along the gonadal drainage system.  Other: Large amount of abnormal enhancing soft tissue throughout the peritoneal cavity, most notable in the omentum, particularly in the right upper quadrant where there is bulky omental caking. Small volume of presumably malignant ascites. No pneumoperitoneum. Some abnormal soft tissue is extending through the anterior abdominal wall into the periumbilical region (image 53 of series 2).  Musculoskeletal: There are no aggressive appearing lytic or blastic lesions noted in the visualized portions of the skeleton.  IMPRESSION: 1. Widespread intraperitoneal malignancy, as demonstrated by extensive peritoneal nodularity and enhancement, widespread omental caking, likely malignant ascites and extensive mesenteric lymphadenopathy. The exact source of malignancy is uncertain, however, given the focally thickened loop of small bowel (likely jejunal) in the left side of the central abdomen (image 56  of series 2), a primary small bowel malignancy is suspected. 2. Small bilateral pleural effusions (left greater than right). Right-sided pleural effusion significantly decreased following thoracentesis. Resolving subsegmental atelectasis in the right lower lobe. 3. Additional incidental findings, as above. These results were called by telephone at the time of interpretation on 07/15/2015 at 5:33 pm to Dr. Olevia Bowens, who verbally acknowledged these results.   Electronically Signed   By: Vinnie Langton M.D.   On: 07/15/2015 17:34    ASSESSMENT & PLAN:   39 year old Caucasian male admitted with  #1 Moderately large right-sided pleural effusion with right lower lobe and right middle lobe atelectasis causing shortness of breath and chest discomfort which is much improved after diagnostic and therapeutic thoracentesis. Pleural fluid is exudative with very high LDH level and strongly lymphocytic predominance concerning for high-grade lymphoma. #2 Widespread intraperitoneal lesions concerning for malignancy as characterized by extensive peritoneal nodularity and enhancement, widespread omental caking, likely malignant ascites and extensive mesenteric adenopathy. Patient has an elevated LDH level of 1050. Previous history of EBV infection. Would be concerned about a high-grade lymphoma such as Burkitt's or double hit DLBCL or ALL unless proven otherwise by biopsy. His HIV status is unknown at this time and would have a bearing on diagnostics and therapeutics. #3 Elevated LDH likely suggesting lymphomatous process. No evidence of hemolysis given normal bilirubin and no anemia. #4 hyperuricemia likely suggestive of some element of spontaneous tumor lysis. Plan  -We'll start the patient on IV fluids with sodium bicarbonate to avoid tumor lysis syndrome -Started the patient on allopurinol 300 mg daily might need to increase dose. -CBC with differential, CMP, uric acid, phosphorus daily -We'll await HIV antibiotic  testing, hepatitis profile, EBV serology and PCR, CMV serologies. -Pleural fluid cytology and flow cytometry as soon as possible -Patient has been scheduled for a CT-guided biopsy of his omental  mass or accessible abdominal lymph node by IR tomorrow. -Would not discharge the patient without a final diagnosis since he might potentially need initiation of treatment as inpatient. -PET/CT as inpatient to complete staging and check for Bone marrow involvement. -Might need bone marrow aspirate or biopsy depending on diagnostic results and PET/CT scan. Currently his blood counts are completely normal. -if ALL might need to be transferred to [facility] for aggressive induction chemotherapy and involved transfusional support and possible transplant consideration. -If alternative diagnosis noted will treat accordingly.   I discussed the findings with the patient and with his consent with his fiance.   All of the patients questions were answered to their apparent satisfaction. The patient knows to call the clinic with any problems, questions or concerns.  I spent 60 minutes counseling the patient face to face. The total time spent in the appointment was 80 minutes and more than 50% was on counseling and direct patient cares.    Sullivan Lone MD [place] AAHIVMS [facility] Hematology/Oncology Physician [place]  (Office):       [phone] (Work cell):  [phone] (Fax):           [phone]  07/16/2015 10:24 AM

## 2015-07-16 NOTE — Consult Note (Signed)
Chief Complaint: Patient was seen in consultation today for omental mass; Lymphadenopathy Chief Complaint  Patient presents with  . Flank Pain   at the request of Dr Aileen Fass  Referring Physician(s): [facility]  History of Present Illness: Donald [name] is a 39 y.o. male   Pt presents to ED with chest discomfort; night sweats; back pain Work up reveals R pleural effusion thora performed by CCM: 1500 cc cloudy fluid Further work up CT:  IMPRESSION: 1. Widespread intraperitoneal malignancy, as demonstrated by extensive peritoneal nodularity and enhancement, widespread omental caking, likely malignant ascites and extensive mesenteric lymphadenopathy. The exact source of malignancy is uncertain, however, given the focally thickened loop of small bowel (likely jejunal) in the left side of the central abdomen (image 56 of series 2), a primary small bowel malignancy is suspected. 2. Small bilateral pleural effusions (left greater than right). Right-sided pleural effusion significantly decreased following thoracentesis. Resolving subsegmental atelectasis in the right lower lobe. 3. Additional incidental findings, as above.  Request made for biopsy in Radiology Dr Aileen Fass has discussed with Oncology Dr Pascal Lux has reviewed imaging and feels best yield for biopsy to be: Omental mass vs periumbilical lymphadenopathy Scheduled now for 9/6 I have seen and examined pt  History reviewed. No pertinent past medical history.  Past Surgical History  Procedure Laterality Date  . Tonsillectomy    . Appendectomy      Allergies: Fluoride preparations and Sulfa antibiotics  Medications: Prior to Admission medications   Medication Sig Start Date End Date Taking? Authorizing Provider  Multiple Vitamin (MULTIVITAMIN) tablet Take 1 tablet by mouth daily.   Yes Historical Provider, MD  azithromycin (ZITHROMAX) 250 MG tablet Take as directed Patient not taking: Reported on 07/15/2015 07/13/15    Eulas Post, MD  gabapentin (NEURONTIN) 300 MG capsule Take 1 capsule (300 mg total) by mouth at bedtime. Patient not taking: Reported on 07/15/2015 07/13/15   Eulas Post, MD  HYDROcodone-acetaminophen (NORCO) 7.5-325 MG per tablet Take 1-2 tablets by mouth every 6 (six) hours as needed for moderate pain. 07/15/15   Charlynne Cousins, MD  levofloxacin (LEVAQUIN) 500 MG tablet Take 1 tablet (500 mg total) by mouth daily. 07/13/15   Eulas Post, MD     Family History  Problem Relation Age of Onset  . Heart failure Father     Social History   Social History  . Marital Status: Married    Spouse Name: N/A  . Number of Children: N/A  . Years of Education: N/A   Social History Main Topics  . Smoking status: Never Smoker   . Smokeless tobacco: None  . Alcohol Use: No  . Drug Use: None  . Sexual Activity: Not Asked   Other Topics Concern  . None   Social History Narrative      Review of Systems: A 12 point ROS discussed and pertinent positives are indicated in the HPI above.  All other systems are negative.  Review of Systems  Constitutional: Positive for chills, diaphoresis, activity change, appetite change and fatigue.  Respiratory: Negative for shortness of breath.   Gastrointestinal: Positive for nausea and vomiting.  Musculoskeletal: Positive for back pain.  Neurological: Negative for dizziness and weakness.  Psychiatric/Behavioral: Negative for behavioral problems and confusion.    Vital Signs: BP 120/60 mmHg  Pulse 90  Temp(Src) 98.8 F (37.1 C) (Oral)  Resp 18  Ht 6' (1.829 m)  Wt 206 lb 12.7 oz (93.8 kg)  BMI 28.04 kg/m2  SpO2  93%  Physical Exam  Constitutional: He is oriented to person, place, and time. He appears well-developed and well-nourished.  Cardiovascular: Normal rate, regular rhythm and normal heart sounds.   No murmur heard. Pulmonary/Chest: Effort normal and breath sounds normal. He has no wheezes.  Abdominal: Soft. Bowel  sounds are normal. He exhibits no distension. There is tenderness.  Musculoskeletal: Normal range of motion.  Neurological: He is alert and oriented to person, place, and time.  Skin: Skin is warm and dry.  Psychiatric: He has a normal mood and affect. His behavior is normal. Judgment and thought content normal.  Vitals reviewed.   Mallampati Score:  MD Evaluation Airway: WNL Heart: WNL Abdomen: WNL Chest/ Lungs: WNL ASA  Classification: 2 Mallampati/Airway Score: One  Imaging: Dg Chest 2 View  07/15/2015   CLINICAL DATA:  Followup right pleural effusion, status post right thoracentesis.  EXAM: CHEST  2 VIEW  COMPARISON:  Yesterday.  FINDINGS: Stable right upper companion rib shadows. No pneumothorax seen. Significantly decreased right pleural fluid. The interstitial markings remain mildly prominent. Normal sized heart. Interval mild left lower lobe linear density. Unremarkable bones.  IMPRESSION: 1. No pneumothorax following right thoracentesis. 2. Significantly less right pleural fluid. 3. Interval mild left lower lobe atelectasis. 4. Stable mild chronic interstitial lung disease.   Electronically Signed   By: Claudie Revering M.D.   On: 07/15/2015 17:21   Dg Chest 2 View  07/14/2015   CLINICAL DATA:  Right-sided chest pain for 2-3 days. Shortness of breath.  EXAM: CHEST  2 VIEW  COMPARISON:  07/12/2015  FINDINGS: Heart size is probably normal. Suspect lung opacity at the right base in addition to the elevated hemidiaphragm. Opacity at the right lung base may be related to subpulmonic effusion. Overall the appearance is stable since previous exam. There is a trace left pleural effusion. No pulmonary edema.  IMPRESSION: Persistent significant opacity at the right lung base. Consider right lateral decubitus view to determine component of layering pleural effusion.   Electronically Signed   By: Nolon Nations M.D.   On: 07/14/2015 16:56   Dg Chest 2 View  07/12/2015   CLINICAL DATA:  Anterior  chest pain for 1 week with cough  EXAM: CHEST  2 VIEW  COMPARISON:  None.  FINDINGS: There is mild elevation the right hemidiaphragm. There is evidence suggesting consolidation in the medial right base. Lungs elsewhere clear. Heart size and pulmonary vascularity are normal. No adenopathy. No bone lesions.  IMPRESSION: Evidence of a degree of consolidation in the medial right base. There is elevation of the right hemidiaphragm. Lungs elsewhere clear. Cardiac silhouette within normal limits.  Followup PA and lateral chest radiographs recommended in 3-4 weeks following trial of antibiotic therapy to ensure resolution and exclude underlying malignancy.   Electronically Signed   By: Lowella Grip III M.D.   On: 07/12/2015 15:52   Ct Angio Chest Pe W/cm &/or Wo Cm  07/14/2015   CLINICAL DATA:  Right chest pain  EXAM: CT ANGIOGRAPHY CHEST WITH CONTRAST  TECHNIQUE: Multidetector CT imaging of the chest was performed using the standard protocol during bolus administration of intravenous contrast. Multiplanar CT image reconstructions and MIPs were obtained to evaluate the vascular anatomy.  CONTRAST:  133mL OMNIPAQUE IOHEXOL 350 MG/ML SOLN  COMPARISON:  Chest x-ray 07/14/2015  FINDINGS: Negative for pulmonary embolism. Negative for aortic dissection or aneurysm  Moderately large right pleural effusion with compressive atelectasis of the right lower lobe. Atelectasis also present in the right middle  lobe. Partial collapse of the right upper lobe medially adjacent to the mediastinum. This extends into the right upper lobe laterally. Small left pleural effusion. No significant infiltrate on the left.  No adenopathy is present in the mediastinum.  No acute bony abnormality  In the upper abdomen, there is mild to moderate ascites. This could be due to liver disease. There is fluid around the liver and spleen. The spleen is not enlarged. Limited imaging of the pancreas and kidneys are grossly normal. There is soft tissue  infiltration in the omentum. Carcinoma not excluded.  Review of the MIP images confirms the above findings.  IMPRESSION: Negative for pulmonary embolism  Moderate right pleural effusion with compressive atelectasis in the right lower lobe. Additional atelectasis in the right middle lobe and right upper lobe. No definite mass or pneumonia however infection and tumor are in the differential. There is a small left effusion  Concerning findings in the abdomen. There is ascites around the liver and spleen. There is soft tissue density involving the omentum which can be seen with carcinoma. Further imaging of the abdomen with CT abdomen pelvis with contrast is suggested.   Electronically Signed   By: Franchot Gallo M.D.   On: 07/14/2015 18:28   Ct Abdomen Pelvis W Contrast  07/15/2015   CLINICAL DATA:  39 year old male with ascites and potential omental disease noted on prior chest CT. Follow-up abdominal CT scan.  EXAM: CT ABDOMEN AND PELVIS WITH CONTRAST  TECHNIQUE: Multidetector CT imaging of the abdomen and pelvis was performed using the standard protocol following bolus administration of intravenous contrast.  CONTRAST:  141mL OMNIPAQUE IOHEXOL 300 MG/ML  SOLN  COMPARISON:  CT of the chest 07/14/2015. CT of the pelvis 03/03/2008.  FINDINGS: Lower chest: Small bilateral pleural effusions (left greater than right). Subsegmental atelectasis in the posterior aspect of the right lower lobe.  Hepatobiliary: No cystic or solid hepatic lesion identified. No intra or extrahepatic biliary ductal dilatation. High attenuation material filling the gallbladder, presumably vicarious excretion of contrast material from the recent chest CT.  Pancreas: No pancreatic mass. No pancreatic ductal dilatation. No pancreatic or peripancreatic fluid or inflammatory changes.  Spleen: Unremarkable.  Adrenals/Urinary Tract: Bilateral adrenal glands and bilateral kidneys are unremarkable in appearance. No hydroureteronephrosis. Urinary bladder  is unremarkable in appearance.  Stomach/Bowel: In the mid small bowel (like mid to distal jejunum) there is a profoundly asymmetrically thickened loop of bowel best demonstrated on image 56 of series 2, where the wall measures up to 22 mm in thickness medially. Adjacent to this there are soft tissue masses extending into the root of the small bowel mesentery, largest of which measures 2.7 x 3.3 cm (image 47 of series 2), likely malignant lymph nodes. The appendix is not confidently identified. No pathologic dilatation of small bowel or colon. Stomach is grossly normal in appearance.  Vascular/Lymphatic: No significant atherosclerotic disease, aneurysm or dissection identified in the abdominal or pelvic vasculature. Circumaortic left renal vein (normal anatomical variant) incidentally noted. Probable mesenteric lymphadenopathy, as discussed above.  Reproductive: Prostate gland and seminal vesicles are unremarkable in appearance. Inguinal canals and visualized portions of the scrotum were grossly unremarkable in appearance. No lymphadenopathy noted along the gonadal drainage system.  Other: Large amount of abnormal enhancing soft tissue throughout the peritoneal cavity, most notable in the omentum, particularly in the right upper quadrant where there is bulky omental caking. Small volume of presumably malignant ascites. No pneumoperitoneum. Some abnormal soft tissue is extending through the anterior abdominal  wall into the periumbilical region (image 53 of series 2).  Musculoskeletal: There are no aggressive appearing lytic or blastic lesions noted in the visualized portions of the skeleton.  IMPRESSION: 1. Widespread intraperitoneal malignancy, as demonstrated by extensive peritoneal nodularity and enhancement, widespread omental caking, likely malignant ascites and extensive mesenteric lymphadenopathy. The exact source of malignancy is uncertain, however, given the focally thickened loop of small bowel (likely  jejunal) in the left side of the central abdomen (image 56 of series 2), a primary small bowel malignancy is suspected. 2. Small bilateral pleural effusions (left greater than right). Right-sided pleural effusion significantly decreased following thoracentesis. Resolving subsegmental atelectasis in the right lower lobe. 3. Additional incidental findings, as above. These results were called by telephone at the time of interpretation on 07/15/2015 at 5:33 pm to Dr. Olevia Bowens, who verbally acknowledged these results.   Electronically Signed   By: Vinnie Langton M.D.   On: 07/15/2015 17:34    Labs:  CBC:  Recent Labs  07/12/15 1234 07/14/15 1644 07/15/15 0440  WBC 7.0 7.0 7.6  HGB 15.3 15.7 15.0  HCT 45.0 46.1 44.7  PLT 220.0 216 222    COAGS:  Recent Labs  07/15/15 0440  INR 1.18  APTT 30    BMP:  Recent Labs  07/12/15 1234 07/14/15 1644 07/15/15 0440  NA 139 137 135  K 3.6 4.0 3.9  CL 103 101 99*  CO2 27 26 24   GLUCOSE 78 97 67  BUN 18 20 17   CALCIUM 8.5 8.5* 8.0*  CREATININE 1.10 1.23 1.22  GFRNONAA  --  >60 >60  GFRAA  --  >60 >60    LIVER FUNCTION TESTS:  Recent Labs  07/12/15 1234 07/15/15 0440 07/15/15 1050  BILITOT 0.3 0.9  --   AST 45* 66*  --   ALT 32 44  --   ALKPHOS 57 53  --   PROT 6.0 5.7* 5.7*  ALBUMIN 3.5 3.1*  --     TUMOR MARKERS: No results for input(s): AFPTM, CEA, CA199, CHROMGRNA in the last 8760 hours.  Assessment and Plan:  Rt pleural effusion Abnormal CT: LAN; omental mass Scheduled now for Bx omental mass vs periumbilical LAN 9/6 Risks and Benefits discussed with the patient including, but not limited to bleeding, infection, damage to adjacent structures or low yield requiring additional tests. All of the patient's questions were answered, patient is agreeable to proceed. Consent signed and in chart.   Thank you for this interesting consult.  I greatly enjoyed meeting Tylee Newby and look forward to participating in their  care.  A copy of this report was sent to the requesting provider on this date.  Signed: Jaleia Hanke A 07/16/2015, 9:20 AM   I spent a total of 40 Minutes    in face to face in clinical consultation, greater than 50% of which was counseling/coordinating care for omental mass bx vs periumbilical LAN bx

## 2015-07-17 ENCOUNTER — Inpatient Hospital Stay (HOSPITAL_COMMUNITY): Payer: BLUE CROSS/BLUE SHIELD

## 2015-07-17 DIAGNOSIS — R55 Syncope and collapse: Secondary | ICD-10-CM

## 2015-07-17 LAB — CBC WITH DIFFERENTIAL/PLATELET
BASOS ABS: 0 10*3/uL (ref 0.0–0.1)
Basophils Relative: 1 % (ref 0–1)
EOS ABS: 0.3 10*3/uL (ref 0.0–0.7)
EOS PCT: 5 % (ref 0–5)
HCT: 41.6 % (ref 39.0–52.0)
HEMOGLOBIN: 14.4 g/dL (ref 13.0–17.0)
LYMPHS ABS: 1.3 10*3/uL (ref 0.7–4.0)
Lymphocytes Relative: 19 % (ref 12–46)
MCH: 29.4 pg (ref 26.0–34.0)
MCHC: 34.6 g/dL (ref 30.0–36.0)
MCV: 85.1 fL (ref 78.0–100.0)
Monocytes Absolute: 0.8 10*3/uL (ref 0.1–1.0)
Monocytes Relative: 12 % (ref 3–12)
NEUTROS PCT: 63 % (ref 43–77)
Neutro Abs: 4.5 10*3/uL (ref 1.7–7.7)
PLATELETS: 199 10*3/uL (ref 150–400)
RBC: 4.89 MIL/uL (ref 4.22–5.81)
RDW: 12.4 % (ref 11.5–15.5)
WBC: 7.1 10*3/uL (ref 4.0–10.5)

## 2015-07-17 LAB — COMPREHENSIVE METABOLIC PANEL
ALBUMIN: 2.7 g/dL — AB (ref 3.5–5.0)
ALK PHOS: 44 U/L (ref 38–126)
ALT: 54 U/L (ref 17–63)
AST: 86 U/L — AB (ref 15–41)
Anion gap: 9 (ref 5–15)
BUN: 8 mg/dL (ref 6–20)
CALCIUM: 8.1 mg/dL — AB (ref 8.9–10.3)
CHLORIDE: 103 mmol/L (ref 101–111)
CO2: 27 mmol/L (ref 22–32)
CREATININE: 1.18 mg/dL (ref 0.61–1.24)
GFR calc non Af Amer: >60 mL/min (ref >60)
GLUCOSE: 85 mg/dL (ref 65–99)
Potassium: 4.2 mmol/L (ref 3.5–5.1)
SODIUM: 139 mmol/L (ref 135–145)
Total Bilirubin: 0.7 mg/dL (ref 0.3–1.2)
Total Protein: 4.8 g/dL — ABNORMAL LOW (ref 6.5–8.1)

## 2015-07-17 LAB — HIV ANTIBODY (ROUTINE TESTING W REFLEX): HIV SCREEN 4TH GENERATION: NONREACTIVE

## 2015-07-17 LAB — URIC ACID: Uric Acid, Serum: 11.4 mg/dL — ABNORMAL HIGH (ref 4.4–7.6)

## 2015-07-17 MED ORDER — FENTANYL CITRATE (PF) 100 MCG/2ML IJ SOLN
INTRAMUSCULAR | Status: AC
  Filled 2015-07-17: qty 2

## 2015-07-17 MED ORDER — LIDOCAINE HCL (PF) 1 % IJ SOLN
INTRAMUSCULAR | Status: AC
  Filled 2015-07-17: qty 10

## 2015-07-17 MED ORDER — ONDANSETRON HCL 4 MG/2ML IJ SOLN
INTRAMUSCULAR | Status: AC
Start: 2015-07-17 — End: 2015-07-18
  Filled 2015-07-17: qty 2

## 2015-07-17 MED ORDER — MIDAZOLAM HCL 2 MG/2ML IJ SOLN
INTRAMUSCULAR | Status: AC
  Filled 2015-07-17: qty 4

## 2015-07-17 MED ORDER — HYDROCODONE-ACETAMINOPHEN 5-325 MG PO TABS
1.0000 | ORAL_TABLET | ORAL | Status: DC | PRN
  Administered 2015-07-18 – 2015-07-30 (×17): 1 via ORAL
  Filled 2015-07-17 (×17): qty 1

## 2015-07-17 MED ORDER — ONDANSETRON HCL 4 MG/2ML IJ SOLN
INTRAMUSCULAR | Status: AC | PRN
  Administered 2015-07-17: 4 mg via INTRAVENOUS

## 2015-07-17 MED ORDER — FENTANYL CITRATE (PF) 100 MCG/2ML IJ SOLN
INTRAMUSCULAR | Status: AC | PRN
  Administered 2015-07-17 (×2): 50 ug via INTRAVENOUS

## 2015-07-17 MED ORDER — MIDAZOLAM HCL 2 MG/2ML IJ SOLN
INTRAMUSCULAR | Status: AC | PRN
  Administered 2015-07-17 (×3): 1 mg via INTRAVENOUS

## 2015-07-17 NOTE — Sedation Documentation (Signed)
1.2L collected during para

## 2015-07-17 NOTE — Procedures (Signed)
1. [place] core bx periumbilical mass 93P x4 to surg path 2. [place] RLQ paracentesis  No complication No blood loss. See complete dictation in [facility].

## 2015-07-17 NOTE — Sedation Documentation (Signed)
Bandaid  L abd area intact

## 2015-07-17 NOTE — Progress Notes (Signed)
  Echocardiogram 2D Echocardiogram has been performed.  Johny Chess 07/17/2015, 11:18 AM

## 2015-07-17 NOTE — Sedation Documentation (Signed)
Biopsy is done, MD doing  paracentesis now

## 2015-07-17 NOTE — Progress Notes (Addendum)
[facility] PROGRESS NOTE Assessment/Plan: Pleural effusion, right/abdominal mass: CT scan of the chest with contrast shows a significant effusion with compression of the right lung. CT scan of the abdomen showed peritoneal carcinomatosis with no lymphadenopathy Pulmonary perform thoracocentesis. Fluid exudative bloodLDH is 1033. Oncology was consulted was concern about lymphoma,IR consulted for for possible biopsy.  Oncology concern about tumor lysis syndrome, he was started on IV hydration. Repeat CXR, US guided paracentesis sent to cytology. Pt's complaining of abd distention.  DVT prophylaxis: resume heparin  Code Status: Full    Consultants: IR  Procedures:  thoracocentesis  Antibiotics:  None  HPI/Subjective: No complaints she is very nervous.  Objective: Filed Vitals:   07/16/15 0430 07/16/15 1400 07/16/15 2001 07/17/15 0429  BP: 120/60 133/69 140/74 145/82  Pulse: 90 76 82 83  Temp: 98.8 F (37.1 C) 98.5 F (36.9 C) 98.7 F (37.1 C) 98.7 F (37.1 C)  TempSrc: Oral Oral Oral Oral  Resp: 18 18 18 18   Height:      Weight:      SpO2: 93% 96% 95% 93%    Intake/Output Summary (Last 24 hours) at 07/17/15 1024 Last data filed at 07/17/15 0906  Gross per 24 hour  Intake    600 ml  Output      0 ml  Net    600 ml   Filed Weights   07/14/15 2306  Weight: 93.8 kg (206 lb 12.7 oz)    Exam:  General: Alert, awake, oriented x3, in no acute distress.  HEENT: No bruits, no goiter.  Heart: Regular rate and rhythm. Lungs: Good air movement, clear Abdomen: Soft, nontender, nondistended, positive bowel sounds.  Neuro: Grossly intact, nonfocal.   Data Reviewed: Basic Metabolic Panel:  Recent Labs Lab 07/12/15 1234 07/14/15 1644 07/15/15 0440 07/16/15 1136 07/17/15 0342  NA 139 137 135 138 139  K 3.6 4.0 3.9 4.3 4.2  CL 103 101 99* 104 103  CO2 27 26 24  20* 27  GLUCOSE 78 97 67 80 85  BUN 18 20 17 13 8   CREATININE 1.10 1.23 1.22 1.18  1.18  CALCIUM 8.5 8.5* 8.0* 8.2* 8.1*  PHOS  --   --   --  2.7  --    Liver Function Tests:  Recent Labs Lab 07/12/15 1234 07/15/15 0440 07/15/15 1050 07/16/15 1136 07/17/15 0342  AST 45* 66*  --  76* 86*  ALT 32 44  --  48 54  ALKPHOS 57 53  --  49 44  BILITOT 0.3 0.9  --  0.9 0.7  PROT 6.0 5.7* 5.7* 5.4* 4.8*  ALBUMIN 3.5 3.1*  --  3.0* 2.7*   No results for input(s): LIPASE, AMYLASE in the last 168 hours. No results for input(s): AMMONIA in the last 168 hours. CBC:  Recent Labs Lab 07/12/15 1234 07/14/15 1644 07/15/15 0440 07/17/15 0342  WBC 7.0 7.0 7.6 7.1  NEUTROABS 4.4  --  4.3 4.5  HGB 15.3 15.7 15.0 14.4  HCT 45.0 46.1 44.7 41.6  MCV 87.6 85.2 86.5 85.1  PLT 220.0 216 222 199   Cardiac Enzymes: No results for input(s): CKTOTAL, CKMB, CKMBINDEX, TROPONINI in the last 168 hours. BNP (last 3 results) No results for input(s): BNP in the last 8760 hours.  ProBNP (last 3 results) No results for input(s): PROBNP in the last 8760 hours.  CBG: No results for input(s): GLUCAP in the last 168 hours.  Recent Results (from the past 240 hour(s))  Fungus Culture with  Smear     Status: None (Preliminary result)   Collection Time: 07/15/15  3:23 PM  Result Value Ref Range Status   Specimen Description PLEURAL RIGHT FLUID  Final   Special Requests PLEURAL RIGHT FLUID  Final   Fungal Smear   Final    NO YEAST OR FUNGAL ELEMENTS SEEN Performed at [facility]    Culture   Final    CULTURE IN PROGRESS FOR FOUR WEEKS Performed at [facility]    Report Status PENDING  Incomplete  Culture, body fluid-bottle     Status: None (Preliminary result)   Collection Time: 07/15/15  3:23 PM  Result Value Ref Range Status   Specimen Description FLUID PLEURAL  Final   Special Requests BOTTLES DRAWN AEROBIC AND ANAEROBIC 5CC  Final   Culture NO GROWTH < 24 HOURS  Final   Report Status PENDING  Incomplete  Gram stain     Status: None   Collection Time:  07/15/15  3:23 PM  Result Value Ref Range Status   Specimen Description FLUID PLEURAL  Final   Special Requests NONE  Final   Gram Stain   Final    ABUNDANT WBC PRESENT,BOTH PMN AND MONONUCLEAR NO ORGANISMS SEEN GRAM STAIN REVIEWED-AGREE WITH RESULT K WOOLEN    Report Status 07/15/2015 FINAL  Final     Studies: Dg Chest 2 View  07/15/2015   CLINICAL DATA:  Followup right pleural effusion, status post right thoracentesis.  EXAM: CHEST  2 VIEW  COMPARISON:  Yesterday.  FINDINGS: Stable right upper companion rib shadows. No pneumothorax seen. Significantly decreased right pleural fluid. The interstitial markings remain mildly prominent. Normal sized heart. Interval mild left lower lobe linear density. Unremarkable bones.  IMPRESSION: 1. No pneumothorax following right thoracentesis. 2. Significantly less right pleural fluid. 3. Interval mild left lower lobe atelectasis. 4. Stable mild chronic interstitial lung disease.   Electronically Signed   By: Claudie Revering M.D.   On: 07/15/2015 17:21   Ct Abdomen Pelvis W Contrast  07/15/2015   CLINICAL DATA:  39 year old male with ascites and potential omental disease noted on prior chest CT. Follow-up abdominal CT scan.  EXAM: CT ABDOMEN AND PELVIS WITH CONTRAST  TECHNIQUE: Multidetector CT imaging of the abdomen and pelvis was performed using the standard protocol following bolus administration of intravenous contrast.  CONTRAST:  177mL OMNIPAQUE IOHEXOL 300 MG/ML  SOLN  COMPARISON:  CT of the chest 07/14/2015. CT of the pelvis 03/03/2008.  FINDINGS: Lower chest: Small bilateral pleural effusions (left greater than right). Subsegmental atelectasis in the posterior aspect of the right lower lobe.  Hepatobiliary: No cystic or solid hepatic lesion identified. No intra or extrahepatic biliary ductal dilatation. High attenuation material filling the gallbladder, presumably vicarious excretion of contrast material from the recent chest CT.  Pancreas: No pancreatic  mass. No pancreatic ductal dilatation. No pancreatic or peripancreatic fluid or inflammatory changes.  Spleen: Unremarkable.  Adrenals/Urinary Tract: Bilateral adrenal glands and bilateral kidneys are unremarkable in appearance. No hydroureteronephrosis. Urinary bladder is unremarkable in appearance.  Stomach/Bowel: In the mid small bowel (like mid to distal jejunum) there is a profoundly asymmetrically thickened loop of bowel best demonstrated on image 56 of series 2, where the wall measures up to 22 mm in thickness medially. Adjacent to this there are soft tissue masses extending into the root of the small bowel mesentery, largest of which measures 2.7 x 3.3 cm (image 47 of series 2), likely malignant lymph nodes. The appendix is not  confidently identified. No pathologic dilatation of small bowel or colon. Stomach is grossly normal in appearance.  Vascular/Lymphatic: No significant atherosclerotic disease, aneurysm or dissection identified in the abdominal or pelvic vasculature. Circumaortic left renal vein (normal anatomical variant) incidentally noted. Probable mesenteric lymphadenopathy, as discussed above.  Reproductive: Prostate gland and seminal vesicles are unremarkable in appearance. Inguinal canals and visualized portions of the scrotum were grossly unremarkable in appearance. No lymphadenopathy noted along the gonadal drainage system.  Other: Large amount of abnormal enhancing soft tissue throughout the peritoneal cavity, most notable in the omentum, particularly in the right upper quadrant where there is bulky omental caking. Small volume of presumably malignant ascites. No pneumoperitoneum. Some abnormal soft tissue is extending through the anterior abdominal wall into the periumbilical region (image 53 of series 2).  Musculoskeletal: There are no aggressive appearing lytic or blastic lesions noted in the visualized portions of the skeleton.  IMPRESSION: 1. Widespread intraperitoneal malignancy, as  demonstrated by extensive peritoneal nodularity and enhancement, widespread omental caking, likely malignant ascites and extensive mesenteric lymphadenopathy. The exact source of malignancy is uncertain, however, given the focally thickened loop of small bowel (likely jejunal) in the left side of the central abdomen (image 56 of series 2), a primary small bowel malignancy is suspected. 2. Small bilateral pleural effusions (left greater than right). Right-sided pleural effusion significantly decreased following thoracentesis. Resolving subsegmental atelectasis in the right lower lobe. 3. Additional incidental findings, as above. These results were called by telephone at the time of interpretation on 07/15/2015 at 5:33 pm to Dr. Olevia Bowens, who verbally acknowledged these results.   Electronically Signed   By: Vinnie Langton M.D.   On: 07/15/2015 17:34    Scheduled Meds: . allopurinol  300 mg Oral Daily  . ondansetron (ZOFRAN) IV  4 mg Intravenous Once   Continuous Infusions: . sodium chloride 10 mL/hr at 07/15/15 1023  .  sodium bicarbonate  infusion 1000 mL 75 mL/hr at 07/17/15 0743    Time Spent: 25 min   Charlynne Cousins  [facility] Pager [phone]. If 7PM-7AM, please contact night-coverage at [email or web address], password [facility] 07/17/2015, 10:24 AM  LOS: 3 days

## 2015-07-18 DIAGNOSIS — R140 Abdominal distension (gaseous): Secondary | ICD-10-CM | POA: Insufficient documentation

## 2015-07-18 DIAGNOSIS — N179 Acute kidney failure, unspecified: Secondary | ICD-10-CM | POA: Insufficient documentation

## 2015-07-18 DIAGNOSIS — R188 Other ascites: Secondary | ICD-10-CM

## 2015-07-18 LAB — HEPATITIS B CORE ANTIBODY, TOTAL: HEP B C TOTAL AB: NEGATIVE

## 2015-07-18 LAB — CBC WITH DIFFERENTIAL/PLATELET
BASOS ABS: 0.1 10*3/uL (ref 0.0–0.1)
BASOS PCT: 1 % (ref 0–1)
EOS ABS: 0.2 10*3/uL (ref 0.0–0.7)
EOS PCT: 3 % (ref 0–5)
HCT: 46.4 % (ref 39.0–52.0)
Hemoglobin: 16 g/dL (ref 13.0–17.0)
Lymphocytes Relative: 23 % (ref 12–46)
Lymphs Abs: 1.5 10*3/uL (ref 0.7–4.0)
MCH: 29.7 pg (ref 26.0–34.0)
MCHC: 34.5 g/dL (ref 30.0–36.0)
MCV: 86.1 fL (ref 78.0–100.0)
MONO ABS: 0.8 10*3/uL (ref 0.1–1.0)
MONOS PCT: 11 % (ref 3–12)
Neutro Abs: 4.2 10*3/uL (ref 1.7–7.7)
Neutrophils Relative %: 62 % (ref 43–77)
PLATELETS: 200 10*3/uL (ref 150–400)
RBC: 5.39 MIL/uL (ref 4.22–5.81)
RDW: 12.5 % (ref 11.5–15.5)
WBC: 6.8 10*3/uL (ref 4.0–10.5)

## 2015-07-18 LAB — PH, BODY FLUID: pH, Body Fluid: 8

## 2015-07-18 LAB — CMV IGM: CMV IgM: <30 AU/mL (ref 0.0–29.9)

## 2015-07-18 LAB — COMPREHENSIVE METABOLIC PANEL
ALBUMIN: 2.8 g/dL — AB (ref 3.5–5.0)
ALT: 85 U/L — ABNORMAL HIGH (ref 17–63)
ANION GAP: 10 (ref 5–15)
AST: 118 U/L — ABNORMAL HIGH (ref 15–41)
Alkaline Phosphatase: 50 U/L (ref 38–126)
BILIRUBIN TOTAL: 0.8 mg/dL (ref 0.3–1.2)
BUN: 12 mg/dL (ref 6–20)
CHLORIDE: 95 mmol/L — AB (ref 101–111)
CO2: 31 mmol/L (ref 22–32)
Calcium: 8.2 mg/dL — ABNORMAL LOW (ref 8.9–10.3)
Creatinine, Ser: 1.33 mg/dL — ABNORMAL HIGH (ref 0.61–1.24)
GFR calc Af Amer: >60 mL/min (ref >60)
GFR calc non Af Amer: >60 mL/min (ref >60)
GLUCOSE: 118 mg/dL — AB (ref 65–99)
POTASSIUM: 4 mmol/L (ref 3.5–5.1)
SODIUM: 136 mmol/L (ref 135–145)
TOTAL PROTEIN: 5.3 g/dL — AB (ref 6.5–8.1)

## 2015-07-18 LAB — RETICULOCYTES
RBC.: 5.39 MIL/uL (ref 4.22–5.81)
RETIC CT PCT: 1.1 % (ref 0.4–3.1)
Retic Count, Absolute: 59.3 10*3/uL (ref 19.0–186.0)

## 2015-07-18 LAB — EPSTEIN BARR VRS(EBV DNA BY PCR)
EBV DNA QN BY PCR: NEGATIVE {copies}/mL
log10 EBV DNA Qn PCR: UNDETERMINED log10copy/mL

## 2015-07-18 LAB — EPSTEIN-BARR VIRUS VCA ANTIBODY PANEL
EBV EARLY ANTIGEN AB, IGG: 12.9 U/mL — AB (ref 0.0–8.9)
EBV NA IGG: 206 U/mL — AB (ref 0.0–17.9)
EBV VCA IgG: 341 U/mL — ABNORMAL HIGH (ref 0.0–17.9)
EBV VCA IgM: <36 U/mL (ref 0.0–35.9)

## 2015-07-18 LAB — AFP TUMOR MARKER: AFP-Tumor Marker: 4.5 ng/mL (ref 0.0–8.3)

## 2015-07-18 LAB — HEPATITIS B SURFACE ANTIGEN: HEP B S AG: NEGATIVE

## 2015-07-18 LAB — RHEUMATOID FACTORS, FLUID: Rheumatoid Arthritis, Qn/Fluid: NEGATIVE

## 2015-07-18 LAB — BETA HCG QUANT (REF LAB)

## 2015-07-18 LAB — LACTATE DEHYDROGENASE: LDH: 1226 U/L — AB (ref 98–192)

## 2015-07-18 LAB — HEPATITIS C ANTIBODY (REFLEX)

## 2015-07-18 LAB — URIC ACID: Uric Acid, Serum: 9.1 mg/dL — ABNORMAL HIGH (ref 4.4–7.6)

## 2015-07-18 LAB — CMV ANTIBODY, IGG (EIA): CMV Ab - IgG: <0.6 U/mL (ref 0.00–0.59)

## 2015-07-18 LAB — HCV COMMENT:

## 2015-07-18 LAB — PATHOLOGIST SMEAR REVIEW

## 2015-07-18 LAB — PHOSPHORUS: Phosphorus: 2.1 mg/dL — ABNORMAL LOW (ref 2.5–4.6)

## 2015-07-18 MED ORDER — SODIUM CHLORIDE 0.9 % IV SOLN
INTRAVENOUS | Status: DC
  Administered 2015-07-18 – 2015-07-20 (×4): via INTRAVENOUS

## 2015-07-18 MED ORDER — SODIUM CHLORIDE 0.9 % IV SOLN
INTRAVENOUS | Status: AC
Start: 2015-07-18 — End: 2015-07-18

## 2015-07-18 MED ORDER — ALLOPURINOL 300 MG PO TABS
300.0000 mg | ORAL_TABLET | Freq: Two times a day (BID) | ORAL | Status: DC
  Administered 2015-07-18 – 2015-07-19 (×3): 300 mg via ORAL
  Filled 2015-07-18 (×4): qty 1

## 2015-07-18 MED ORDER — ONDANSETRON HCL 4 MG/2ML IJ SOLN
4.0000 mg | Freq: Four times a day (QID) | INTRAMUSCULAR | Status: DC | PRN
  Administered 2015-07-18 – 2015-07-20 (×3): 4 mg via INTRAVENOUS
  Filled 2015-07-18 (×3): qty 2

## 2015-07-18 NOTE — Progress Notes (Addendum)
[name]   HEMATOLOGY/ONCOLOGY INPATIENT PROGRESS NOTE  Date of Service: 07/18/2015  Inpatient Attending: .Barton Dubois, MD   SUBJECTIVE  Patient was seen this morning at 7:00. He had his ultrasound-guided abdominal paracentesis and core needle biopsy of a subcutaneous nodule/mass. Pathology pending. I talked to the pathology department yesterday about trying to do a flow cytometry on his pleural fluid since the order was not picked up and they mentioned that they are unable to do it at this time. Awaiting pathology results from the biopsy and paracentesis yesterday. Patient notes no acute new symptoms. Notes some relief of his abdominal distention after paracentesis. We discussed the results thus far including the thoracentesis results and concern for possible lymphoma. I made him aware that his uric acid levels were high and we started him on allopurinol for tumor lysis syndrome protection. PET scan is pending.  OBJECTIVE:  PHYSICAL EXAMINATION: . Filed Vitals:   07/17/15 1546 07/17/15 1548 07/17/15 2009 07/18/15 0443  BP: 131/85 132/80 129/82 125/72  Pulse: 90 89 91 98  Temp:   98.5 F (36.9 C) 98.4 F (36.9 C)  TempSrc:   Oral Oral  Resp: 20 20 18 18   Height:      Weight:      SpO2: 94% 93% 95% 90%   Filed Weights   07/14/15 2306  Weight: 206 lb 12.7 oz (93.8 kg)   .Body mass index is 28.04 kg/(m^2).  GENERAL:alert, in no acute distress and comfortable SKIN: skin color, texture, turgor are normal, no rashes or significant lesions EYES: normal, conjunctiva are pink and non-injected, sclera clear OROPHARYNX:no exudate, no erythema and lips, buccal mucosa, and tongue normal  NECK: supple, no JVD, thyroid normal size, non-tender, without nodularity LYMPH:  no palpable lymphadenopathy in the cervical, axillary or inguinal LUNGS: clear to auscultation, decreased air entry rt lung base about 1/4 up posterior chest wall HEART: regular rate & rhythm,  no murmurs and no lower  extremity edema ABDOMEN: Abdomen slightly less distended. Still with periumbilical induration. Musculoskeletal: no cyanosis of digits and no clubbing  PSYCH: alert & oriented x 3 with fluent speech NEURO: no focal motor/sensory deficits  MEDICAL HISTORY:  Past Medical History  Diagnosis Date  . EBV infection 2013    Patient notes that he had an EBV infection in 2013 that left him with chronic fatigue. She also notes that he had significant lymphadenopathy and thyroiditis at the time. He has been managing his symptoms with an alternative medicine practitioner in [place] and with other alternative medicines.  . Marijuana use, episodic    will   SURGICAL HISTORY: Past Surgical History  Procedure Laterality Date  . Tonsillectomy    . Appendectomy      SOCIAL HISTORY: Social History   Social History  . Marital Status: Married    Spouse Name: N/A  . Number of Children: N/A  . Years of Education: N/A   Occupational History  . Not on file.   Social History Main Topics  . Smoking status: Never Smoker   . Smokeless tobacco: Not on file  . Alcohol Use: No  . Drug Use: 1.00 per week    Special: Marijuana  . Sexual Activity: Not on file   Other Topics Concern  . 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   He has a fiance and has been in a steady relationship.  He has tried to maintain a very healthy lifestyle and cycles about 100 miles a week. He also uses a fair number of over-the-counter alternative medicines to stay healthy.    FAMILY HISTORY: Family History  Problem Relation Age of Onset  . Heart failure Father     ALLERGIES:  is allergic to fluoride preparations and sulfa antibiotics.  MEDICATIONS:  Scheduled Meds: . allopurinol  300 mg Oral Daily  . ondansetron (ZOFRAN) IV  4 mg Intravenous Once   Continuous Infusions: . sodium chloride 10 mL/hr at  07/15/15 1023  .  sodium bicarbonate  infusion 1000 mL 75 mL/hr at 07/18/15 0257   PRN Meds:.HYDROcodone-acetaminophen, ketorolac, LORazepam, morphine injection  REVIEW OF SYSTEMS:    10 Point review of Systems was done is negative except as noted above.   LABORATORY DATA:  I have reviewed the data as listed  . CBC Latest Ref Rng 07/18/2015 07/17/2015 07/15/2015  WBC 4.0 - 10.5 K/uL 6.8 7.1 7.6  Hemoglobin 13.0 - 17.0 g/dL 16.0 14.4 15.0  Hematocrit 39.0 - 52.0 % 46.4 41.6 44.7  Platelets 150 - 400 K/uL 200 199 222    . CMP Latest Ref Rng 07/18/2015 07/17/2015 07/16/2015  Glucose 65 - 99 mg/dL 118(H) 85 80  BUN 6 - 20 mg/dL 12 8 13   Creatinine 0.61 - 1.24 mg/dL 1.33(H) 1.18 1.18  Sodium 135 - 145 mmol/L 136 139 138  Potassium 3.5 - 5.1 mmol/L 4.0 4.2 4.3  Chloride 101 - 111 mmol/L 95(L) 103 104  CO2 22 - 32 mmol/L 31 27 20(L)  Calcium 8.9 - 10.3 mg/dL 8.2(L) 8.1(L) 8.2(L)  Total Protein 6.5 - 8.1 g/dL 5.3(L) 4.8(L) 5.4(L)  Total Bilirubin 0.3 - 1.2 mg/dL 0.8 0.7 0.9  Alkaline Phos 38 - 126 U/L 50 44 49  AST 15 - 41 U/L 118(H) 86(H) 76(H)  ALT 17 - 63 U/L 85(H) 54 48   . Lab Results  Component Value Date   LDH 1226* 07/18/2015     RADIOGRAPHIC STUDIES: I have personally reviewed the radiological images as listed and agreed with the findings in the report. Dg Chest 2 View  07/15/2015   CLINICAL DATA:  Followup right pleural effusion, status post right thoracentesis.  EXAM: CHEST  2 VIEW  COMPARISON:  Yesterday.  FINDINGS: Stable right upper companion rib shadows. No pneumothorax seen. Significantly decreased right pleural fluid. The interstitial markings remain mildly prominent. Normal sized heart. Interval mild left lower lobe linear density. Unremarkable bones.  IMPRESSION: 1. No pneumothorax following right thoracentesis. 2. Significantly less right pleural fluid. 3. Interval mild left lower lobe atelectasis. 4. Stable mild chronic interstitial lung disease.   Electronically Signed    By: Claudie Revering M.D.   On: 07/15/2015 17:21   Dg Chest 2 View  07/14/2015   CLINICAL DATA:  Right-sided chest pain for 2-3 days. Shortness of breath.  EXAM: CHEST  2 VIEW  COMPARISON:  07/12/2015  FINDINGS: Heart size is probably normal. Suspect lung opacity at the right base in addition to the elevated hemidiaphragm. Opacity at the right lung base may be related to subpulmonic effusion. Overall the appearance is stable since previous exam. There is a trace left pleural effusion. No pulmonary edema.  IMPRESSION: Persistent significant opacity at the right lung base. Consider right lateral decubitus view to determine component of layering pleural effusion.   Electronically Signed   By: Nolon Nations M.D.   On: 07/14/2015 16:56   Dg Chest 2 View  07/12/2015  CLINICAL DATA:  Anterior chest pain for 1 week with cough  EXAM: CHEST  2 VIEW  COMPARISON:  None.  FINDINGS: There is mild elevation the right hemidiaphragm. There is evidence suggesting consolidation in the medial right base. Lungs elsewhere clear. Heart size and pulmonary vascularity are normal. No adenopathy. No bone lesions.  IMPRESSION: Evidence of a degree of consolidation in the medial right base. There is elevation of the right hemidiaphragm. Lungs elsewhere clear. Cardiac silhouette within normal limits.  Followup PA and lateral chest radiographs recommended in 3-4 weeks following trial of antibiotic therapy to ensure resolution and exclude underlying malignancy.   Electronically Signed   By: Lowella Grip III M.D.   On: 07/12/2015 15:52   Ct Angio Chest Pe W/cm &/or Wo Cm  07/14/2015   CLINICAL DATA:  Right chest pain  EXAM: CT ANGIOGRAPHY CHEST WITH CONTRAST  TECHNIQUE: Multidetector CT imaging of the chest was performed using the standard protocol during bolus administration of intravenous contrast. Multiplanar CT image reconstructions and MIPs were obtained to evaluate the vascular anatomy.  CONTRAST:  135mL OMNIPAQUE IOHEXOL 350  MG/ML SOLN  COMPARISON:  Chest x-ray 07/14/2015  FINDINGS: Negative for pulmonary embolism. Negative for aortic dissection or aneurysm  Moderately large right pleural effusion with compressive atelectasis of the right lower lobe. Atelectasis also present in the right middle lobe. Partial collapse of the right upper lobe medially adjacent to the mediastinum. This extends into the right upper lobe laterally. Small left pleural effusion. No significant infiltrate on the left.  No adenopathy is present in the mediastinum.  No acute bony abnormality  In the upper abdomen, there is mild to moderate ascites. This could be due to liver disease. There is fluid around the liver and spleen. The spleen is not enlarged. Limited imaging of the pancreas and kidneys are grossly normal. There is soft tissue infiltration in the omentum. Carcinoma not excluded.  Review of the MIP images confirms the above findings.  IMPRESSION: Negative for pulmonary embolism  Moderate right pleural effusion with compressive atelectasis in the right lower lobe. Additional atelectasis in the right middle lobe and right upper lobe. No definite mass or pneumonia however infection and tumor are in the differential. There is a small left effusion  Concerning findings in the abdomen. There is ascites around the liver and spleen. There is soft tissue density involving the omentum which can be seen with carcinoma. Further imaging of the abdomen with CT abdomen pelvis with contrast is suggested.   Electronically Signed   By: Franchot Gallo M.D.   On: 07/14/2015 18:28   Ct Abdomen Pelvis W Contrast  07/15/2015   CLINICAL DATA:  39 year old male with ascites and potential omental disease noted on prior chest CT. Follow-up abdominal CT scan.  EXAM: CT ABDOMEN AND PELVIS WITH CONTRAST  TECHNIQUE: Multidetector CT imaging of the abdomen and pelvis was performed using the standard protocol following bolus administration of intravenous contrast.  CONTRAST:  152mL  OMNIPAQUE IOHEXOL 300 MG/ML  SOLN  COMPARISON:  CT of the chest 07/14/2015. CT of the pelvis 03/03/2008.  FINDINGS: Lower chest: Small bilateral pleural effusions (left greater than right). Subsegmental atelectasis in the posterior aspect of the right lower lobe.  Hepatobiliary: No cystic or solid hepatic lesion identified. No intra or extrahepatic biliary ductal dilatation. High attenuation material filling the gallbladder, presumably vicarious excretion of contrast material from the recent chest CT.  Pancreas: No pancreatic mass. No pancreatic ductal dilatation. No pancreatic or peripancreatic fluid or  inflammatory changes.  Spleen: Unremarkable.  Adrenals/Urinary Tract: Bilateral adrenal glands and bilateral kidneys are unremarkable in appearance. No hydroureteronephrosis. Urinary bladder is unremarkable in appearance.  Stomach/Bowel: In the mid small bowel (like mid to distal jejunum) there is a profoundly asymmetrically thickened loop of bowel best demonstrated on image 56 of series 2, where the wall measures up to 22 mm in thickness medially. Adjacent to this there are soft tissue masses extending into the root of the small bowel mesentery, largest of which measures 2.7 x 3.3 cm (image 47 of series 2), likely malignant lymph nodes. The appendix is not confidently identified. No pathologic dilatation of small bowel or colon. Stomach is grossly normal in appearance.  Vascular/Lymphatic: No significant atherosclerotic disease, aneurysm or dissection identified in the abdominal or pelvic vasculature. Circumaortic left renal vein (normal anatomical variant) incidentally noted. Probable mesenteric lymphadenopathy, as discussed above.  Reproductive: Prostate gland and seminal vesicles are unremarkable in appearance. Inguinal canals and visualized portions of the scrotum were grossly unremarkable in appearance. No lymphadenopathy noted along the gonadal drainage system.  Other: Large amount of abnormal enhancing soft  tissue throughout the peritoneal cavity, most notable in the omentum, particularly in the right upper quadrant where there is bulky omental caking. Small volume of presumably malignant ascites. No pneumoperitoneum. Some abnormal soft tissue is extending through the anterior abdominal wall into the periumbilical region (image 53 of series 2).  Musculoskeletal: There are no aggressive appearing lytic or blastic lesions noted in the visualized portions of the skeleton.  IMPRESSION: 1. Widespread intraperitoneal malignancy, as demonstrated by extensive peritoneal nodularity and enhancement, widespread omental caking, likely malignant ascites and extensive mesenteric lymphadenopathy. The exact source of malignancy is uncertain, however, given the focally thickened loop of small bowel (likely jejunal) in the left side of the central abdomen (image 56 of series 2), a primary small bowel malignancy is suspected. 2. Small bilateral pleural effusions (left greater than right). Right-sided pleural effusion significantly decreased following thoracentesis. Resolving subsegmental atelectasis in the right lower lobe. 3. Additional incidental findings, as above. These results were called by telephone at the time of interpretation on 07/15/2015 at 5:33 pm to Dr. Olevia Bowens, who verbally acknowledged these results.   Electronically Signed   By: Vinnie Langton M.D.   On: 07/15/2015 17:34   US Biopsy  07/17/2015   CLINICAL DATA:  Extensive peritoneal nodularity and omental caking of uncertain etiology. Periumbilical masses.  EXAM: ULTRASOUND-GUIDED CORE PERIUMBILICAL BIOPSY  TECHNIQUE: An ultrasound guided biopsy was thoroughly discussed with the patient and questions were answered. The benefits, risks, alternatives, and complications were also discussed. The patient understands and wishes to proceed with the procedure. A verbal as well as written consent was obtained.  Survey ultrasound of the periumbilical region was performed and an  appropriate skin entry site was determined. Skin site was marked, prepped with chlorhexidine, and draped in usual sterile fashion, and infiltrated locally with 1% lidocaine.  Intravenous Fentanyl and Versed were administered as conscious sedation during continuous cardiorespiratory monitoring by the radiology RN, with a total moderate sedation time of less than 30 minutes.  Under real-time ultrasound guidance, a 17 gauge trocar needle was advanced to the margin of the lesion. Once needle tip position was confirmed, coaxial 18-gauge core biopsy samples were obtained, submitted in saline to surgical pathology. The guide needle was removed. Postprocedure scans show no hemorrhage or other apparent complication.  COMPLICATIONS: COMPLICATIONS None immediate  FINDINGS: Nodular periumbilical subcutaneous masses were localized. Core biopsy samples were obtained  without complication.  IMPRESSION: 1. Technically successful ultrasound guided core periumbilical nodule biopsy.   Electronically Signed   By: Lucrezia Europe M.D.   On: 07/17/2015 16:15   US Paracentesis  07/17/2015   CLINICAL DATA:  Omental disease and peritoneal nodules.  EXAM: ULTRASOUND GUIDED PARACENTESIS  TECHNIQUE: The procedure, risks (including but not limited to bleeding, infection, organ damage ), benefits, and alternatives were explained to the patient. Questions regarding the procedure were encouraged and answered. The patient understands and consents to the procedure. Survey ultrasound of the abdomen was performed and an appropriate skin entry site in the right lower abdomen was selected. Skin site was marked, prepped with Betadine, and draped in usual sterile fashion, and infiltrated locally with 1% lidocaine. A 5 French multisidehole Yueh sheath needle was advanced into the peritoneal space until fluid could be aspirated. The sheath was advanced and the needle removed. 1.2 L of cloudy yellow ascites were aspirated. A sample sent to cytology for the  requested studies.  COMPLICATIONS: COMPLICATIONS none  IMPRESSION: Technically successful ultrasound guided paracentesis, removing 1.2 L of ascites. Sample to cytology.   Electronically Signed   By: Lucrezia Europe M.D.   On: 07/17/2015 16:17    ASSESSMENT & PLAN:    39 year old Caucasian male admitted with  #1 Moderately large right-sided pleural effusion with right lower lobe and right middle lobe atelectasis causing shortness of breath and chest discomfort which is much improved after diagnostic and therapeutic thoracentesis. Pleural fluid is exudative with very high LDH level and strongly lymphocytic predominance concerning for high-grade lymphoma. I talked to pathology and they were unable to do a flow cytometry on the pleural fluid. Cytology pending.  #2 Widespread intraperitoneal lesions concerning for malignancy as characterized by extensive peritoneal nodularity and enhancement, widespread omental caking, likely malignant ascites and extensive mesenteric adenopathy. Patient has an elevated LDH level of 1050 now increased to more than 1200. Previous history of EBV infection.  Component     Latest Ref Rng 07/16/2015  HIV Screen 4th Generation wRfx     Non Reactive Non Reactive  Hepatitis B Surface Ag     Negative Negative  HCV Ab     0.0 - 0.9 s/co ratio <0.1  Hep B Core Total Ab     Negative Negative  CMV IgM     0.0 - 29.9 AU/mL <30.0  CMV Ab - IgG     0.00 - 0.59 U/mL <0.60   EBV labs pending Would be concerned about a high-grade lymphoma such as Burkitt's or double hit DLBCL or ALL unless proven otherwise by biopsy. His HIV status is unknown at this time and would have a bearing on diagnostics and therapeutics. #3 Elevated LDH likely suggesting lymphomatous process. No evidence of hemolysis given normal bilirubin and no anemia. #4 hyperuricemia likely suggestive of some element of spontaneous tumor lysis. Uric acid improved a little bit on allopurinol. #5 acute kidney injury-  could be from a couple of different intravenous contrast exposure or spontaneous tumor lysis.  Plan -Will need to change IV fluids due to increasing bicarbonate. -We'll give bolus 1000 cc of normal saline and then start a drip at 125 mL an hour. -Continue allopurinol  -CMP, uric acid twice daily. -If increasing uric acid levels are worsening kidney function might need to consider Rasburicase -Pleural fluid cytology and flow cytometry as soon as possible. -Need to get a preliminary result of his ascitic fluid analysis and biopsy today. -If being delayed might need to  start him on steroids. --PET/CT stat today -Might need bone marrow aspirate or biopsy depending on diagnostic results and PET/CT scan. Currently his blood counts are completely normal. -if ALL might need to be transferred to [facility] for aggressive induction chemotherapy and involved transfusional support and possible transplant consideration. -If alternative diagnosis noted will treat accordingly.  I discussed the findings with the patient and with his brother at bedside.  All of the patients questions were answered to their apparent satisfaction. The patient knows to call the clinic with any problems, questions or concerns.  I spent 30 minutes counseling the patient face to face. The total time spent in the appointment was 40 minutes and more than 50% was on counseling and direct patient cares.   Sullivan Lone MD [place] AAHIVMS [facility] Hematology/Oncology Physician [place]  (Office): [phone] (Work cell): [phone] (Fax): [phone]  07/18/2015 10:31 AM   Addendum  Hulen Skains and talked to pathologist Dr. Tresa Moore and Dr. Monica Martinez to try to speed up the pathologic diagnosis. The try to get a flow cytometry done on the pleural fluid and ascitic fluid. Lots of necrotic-looking cells. Neoplastic cells show some surface immunoglobulins which are more consistent with a  high-grade B-cell lymphoma as opposed to ALL. The core tissue obtained on biopsy has not been processed yet to determine if adequate sample is available. They will let me know tomorrow morning if additional tissues required for them to make a diagnosis. Plan -PET/CT scan still pending. (please call radiology to get this done) -Would recommend transferring the patient if agreeable to [facility] given that he will likely need chemotherapy for his lymphoma unless this is possible at [facility].   Sullivan Lone MD MS Hematology/Oncology Physician [facility]

## 2015-07-18 NOTE — Progress Notes (Signed)
07/18/2015 11:34 PM The patient wanted to be taken off his continuous fluids because he thought there could be a build up of fluid in his lungs.  He said he would try just to drink a lot of fluids to stay hydrated. At around 21:15 PM, the patient took himself off his fluids.  Dr. Hilbert Bible was made aware of what the patient wanted, and recommended to continue the patient's IV fluids because his kidney functions are going down and need continuous fluids to help improve his kidneys function.  The patient was made aware of Dr. Delle Reining recommendation and agreed to be put back on continuous IV normal saline fluids at 125 mL/hr.  Will continue to monitor patient. Lupita Dawn, RN

## 2015-07-18 NOTE — Progress Notes (Signed)
[facility] PROGRESS NOTE Assessment/Plan: Pleural effusion, right/abdominal mass: -CT scan of the chest with contrast shows a significant effusion with compression of the right lung. -CT scan of the abdomen showed peritoneal carcinomatosis with no lymphadenopathy -Pulmonary perform thoracocentesis. Fluid exudative bloodLDH is 0973. -Oncology has been consulted concern: include lymphoma  -IR consulted for for possible biopsy and on 9/6 performed US core biopsy of abd mass and also diagnostic/therapeutic paracentesis.  -Oncology concern about tumor lysis syndrome, he was started on IV hydration and allopurinol.  AKI: due to tumor lysis syndrome and contrast -will continue IVF's -will follow renal function  Tumor lysis syndrome: -started on allopurinol -will follow CMP and uric acid level -continue aggressive IVF's  Screening process: -non reactive HIV -CMV IGM and antibody neg -EBV results (DNA by PCR and VCA antibody) pending  -ESR 6 -CRP 4.3 -hepatitis panel neg -AFP 4.5 -HCG tumor marker < 1  Barton Dubois [phone]    DVT prophylaxis: on heparin  Code Status: Full   Consultants:  IR Oncology   Procedures:  Thoracocentesis  Paracentesis   Antibiotics:  None  HPI/Subjective: No fever, no CP, no SOB. Reports some abd pain, but improved after paracentesis   Objective: Filed Vitals:   07/17/15 1546 07/17/15 1548 07/17/15 2009 07/18/15 0443  BP: 131/85 132/80 129/82 125/72  Pulse: 90 89 91 98  Temp:   98.5 F (36.9 C) 98.4 F (36.9 C)  TempSrc:   Oral Oral  Resp: 20 20 18 18   Height:      Weight:      SpO2: 94% 93% 95% 90%    Intake/Output Summary (Last 24 hours) at 07/18/15 1140 Last data filed at 07/18/15 0840  Gross per 24 hour  Intake    480 ml  Output      0 ml  Net    480 ml   Filed Weights   07/14/15 2306  Weight: 93.8 kg (206 lb 12.7 oz)    Exam:  General: Alert, awake, oriented x3, in no acute distress. Afebrile and  reporting pain is well controlled  HEENT: No bruits, no goiter; no JVD Heart: Regular rate, no rubs, no gallops, no murmurs  Lungs: Good air movement, no wheezing, no rales Abdomen: Soft, slightly tender to palpation tight side, soft, positive BS, mild distension.  Neuro: Grossly intact, nonfocal.   Data Reviewed: Basic Metabolic Panel:  Recent Labs Lab 07/14/15 1644 07/15/15 0440 07/16/15 1136 07/17/15 0342 07/18/15 0920  NA 137 135 138 139 136  K 4.0 3.9 4.3 4.2 4.0  CL 101 99* 104 103 95*  CO2 26 24 20* 27 31  GLUCOSE 97 67 80 85 118*  BUN 20 17 13 8 12   CREATININE 1.23 1.22 1.18 1.18 1.33*  CALCIUM 8.5* 8.0* 8.2* 8.1* 8.2*  PHOS  --   --  2.7  --  2.1*   Liver Function Tests:  Recent Labs Lab 07/12/15 1234 07/15/15 0440 07/15/15 1050 07/16/15 1136 07/17/15 0342 07/18/15 0920  AST 45* 66*  --  76* 86* 118*  ALT 32 44  --  48 54 85*  ALKPHOS 57 53  --  49 44 50  BILITOT 0.3 0.9  --  0.9 0.7 0.8  PROT 6.0 5.7* 5.7* 5.4* 4.8* 5.3*  ALBUMIN 3.5 3.1*  --  3.0* 2.7* 2.8*   CBC:  Recent Labs Lab 07/12/15 1234 07/14/15 1644 07/15/15 0440 07/17/15 0342 07/18/15 0920  WBC 7.0 7.0 7.6 7.1 6.8  NEUTROABS 4.4  --  4.3 4.5 4.2  HGB 15.3 15.7 15.0 14.4 16.0  HCT 45.0 46.1 44.7 41.6 46.4  MCV 87.6 85.2 86.5 85.1 86.1  PLT 220.0 216 222 199 200   BNP (last 3 results) No results for input(s): BNP in the last 8760 hours.  ProBNP (last 3 results) No results for input(s): PROBNP in the last 8760 hours.  CBG: No results for input(s): GLUCAP in the last 168 hours.  Recent Results (from the past 240 hour(s))  AFB culture with smear     Status: None (Preliminary result)   Collection Time: 07/15/15  3:23 PM  Result Value Ref Range Status   Specimen Description PLEURAL RIGHT FLUID  Final   Special Requests PLEURAL RIGHT FLUID  Final   Acid Fast Smear   Final    NO ACID FAST BACILLI SEEN Performed at [facility]    Culture   Final    CULTURE WILL BE  EXAMINED FOR 6 WEEKS BEFORE ISSUING A FINAL REPORT Performed at [facility]    Report Status PENDING  Incomplete  Fungus Culture with Smear     Status: None (Preliminary result)   Collection Time: 07/15/15  3:23 PM  Result Value Ref Range Status   Specimen Description PLEURAL RIGHT FLUID  Final   Special Requests PLEURAL RIGHT FLUID  Final   Fungal Smear   Final    NO YEAST OR FUNGAL ELEMENTS SEEN Performed at [facility]    Culture   Final    CULTURE IN PROGRESS FOR FOUR WEEKS Performed at [facility]    Report Status PENDING  Incomplete  Culture, body fluid-bottle     Status: None (Preliminary result)   Collection Time: 07/15/15  3:23 PM  Result Value Ref Range Status   Specimen Description FLUID PLEURAL  Final   Special Requests BOTTLES DRAWN AEROBIC AND ANAEROBIC 5CC  Final   Culture NO GROWTH 2 DAYS  Final   Report Status PENDING  Incomplete  Gram stain     Status: None   Collection Time: 07/15/15  3:23 PM  Result Value Ref Range Status   Specimen Description FLUID PLEURAL  Final   Special Requests NONE  Final   Gram Stain   Final    ABUNDANT WBC PRESENT,BOTH PMN AND MONONUCLEAR NO ORGANISMS SEEN GRAM STAIN REVIEWED-AGREE WITH RESULT K WOOLEN    Report Status 07/15/2015 FINAL  Final     Studies: US Biopsy  07/17/2015   CLINICAL DATA:  Extensive peritoneal nodularity and omental caking of uncertain etiology. Periumbilical masses.  EXAM: ULTRASOUND-GUIDED CORE PERIUMBILICAL BIOPSY  TECHNIQUE: An ultrasound guided biopsy was thoroughly discussed with the patient and questions were answered. The benefits, risks, alternatives, and complications were also discussed. The patient understands and wishes to proceed with the procedure. A verbal as well as written consent was obtained.  Survey ultrasound of the periumbilical region was performed and an appropriate skin entry site was determined. Skin site was marked, prepped with chlorhexidine, and draped in  usual sterile fashion, and infiltrated locally with 1% lidocaine.  Intravenous Fentanyl and Versed were administered as conscious sedation during continuous cardiorespiratory monitoring by the radiology RN, with a total moderate sedation time of less than 30 minutes.  Under real-time ultrasound guidance, a 17 gauge trocar needle was advanced to the margin of the lesion. Once needle tip position was confirmed, coaxial 18-gauge core biopsy samples were obtained, submitted in saline to surgical pathology. The guide needle was removed. Postprocedure scans show  no hemorrhage or other apparent complication.  COMPLICATIONS: COMPLICATIONS None immediate  FINDINGS: Nodular periumbilical subcutaneous masses were localized. Core biopsy samples were obtained without complication.  IMPRESSION: 1. Technically successful ultrasound guided core periumbilical nodule biopsy.   Electronically Signed   By: Lucrezia Europe M.D.   On: 07/17/2015 16:15   US Paracentesis  07/17/2015   CLINICAL DATA:  Omental disease and peritoneal nodules.  EXAM: ULTRASOUND GUIDED PARACENTESIS  TECHNIQUE: The procedure, risks (including but not limited to bleeding, infection, organ damage ), benefits, and alternatives were explained to the patient. Questions regarding the procedure were encouraged and answered. The patient understands and consents to the procedure. Survey ultrasound of the abdomen was performed and an appropriate skin entry site in the right lower abdomen was selected. Skin site was marked, prepped with Betadine, and draped in usual sterile fashion, and infiltrated locally with 1% lidocaine. A 5 French multisidehole Yueh sheath needle was advanced into the peritoneal space until fluid could be aspirated. The sheath was advanced and the needle removed. 1.2 L of cloudy yellow ascites were aspirated. A sample sent to cytology for the requested studies.  COMPLICATIONS: COMPLICATIONS none  IMPRESSION: Technically successful ultrasound guided  paracentesis, removing 1.2 L of ascites. Sample to cytology.   Electronically Signed   By: Lucrezia Europe M.D.   On: 07/17/2015 16:17    Scheduled Meds: . allopurinol  300 mg Oral Daily  . ondansetron (ZOFRAN) IV  4 mg Intravenous Once   Continuous Infusions: . sodium chloride 10 mL/hr at 07/15/15 1023  . sodium chloride    . sodium chloride      Time Spent: 30 min   Barton Dubois  [facility] Pager [phone] If 7PM-7AM, please contact night-coverage at [email or web address], password [email or web address] 07/18/2015, 11:40 AM  LOS: 4 days

## 2015-07-19 DIAGNOSIS — N179 Acute kidney failure, unspecified: Secondary | ICD-10-CM | POA: Insufficient documentation

## 2015-07-19 DIAGNOSIS — C8590 Non-Hodgkin lymphoma, unspecified, unspecified site: Secondary | ICD-10-CM | POA: Insufficient documentation

## 2015-07-19 DIAGNOSIS — R7989 Other specified abnormal findings of blood chemistry: Secondary | ICD-10-CM | POA: Insufficient documentation

## 2015-07-19 DIAGNOSIS — R945 Abnormal results of liver function studies: Secondary | ICD-10-CM

## 2015-07-19 LAB — COMPREHENSIVE METABOLIC PANEL
ALK PHOS: 48 U/L (ref 38–126)
ALT: 80 U/L — ABNORMAL HIGH (ref 17–63)
ANION GAP: 13 (ref 5–15)
AST: 110 U/L — ABNORMAL HIGH (ref 15–41)
Albumin: 2.7 g/dL — ABNORMAL LOW (ref 3.5–5.0)
BUN: 16 mg/dL (ref 6–20)
CALCIUM: 8 mg/dL — AB (ref 8.9–10.3)
CO2: 23 mmol/L (ref 22–32)
Chloride: 101 mmol/L (ref 101–111)
Creatinine, Ser: 1.72 mg/dL — ABNORMAL HIGH (ref 0.61–1.24)
GFR calc non Af Amer: 48 mL/min — ABNORMAL LOW (ref >60)
GFR, EST AFRICAN AMERICAN: 56 mL/min — AB (ref >60)
Glucose, Bld: 91 mg/dL (ref 65–99)
Potassium: 4.1 mmol/L (ref 3.5–5.1)
SODIUM: 137 mmol/L (ref 135–145)
TOTAL PROTEIN: 5 g/dL — AB (ref 6.5–8.1)
Total Bilirubin: 0.9 mg/dL (ref 0.3–1.2)

## 2015-07-19 LAB — CBC WITH DIFFERENTIAL/PLATELET
Basophils Absolute: 0.1 10*3/uL (ref 0.0–0.1)
Basophils Relative: 1 % (ref 0–1)
EOS ABS: 0.2 10*3/uL (ref 0.0–0.7)
EOS PCT: 2 % (ref 0–5)
HCT: 46.7 % (ref 39.0–52.0)
HEMOGLOBIN: 16 g/dL (ref 13.0–17.0)
LYMPHS ABS: 1.6 10*3/uL (ref 0.7–4.0)
Lymphocytes Relative: 18 % (ref 12–46)
MCH: 29.4 pg (ref 26.0–34.0)
MCHC: 34.3 g/dL (ref 30.0–36.0)
MCV: 85.7 fL (ref 78.0–100.0)
MONO ABS: 0.9 10*3/uL (ref 0.1–1.0)
MONOS PCT: 10 % (ref 3–12)
Neutro Abs: 5.9 10*3/uL (ref 1.7–7.7)
Neutrophils Relative %: 69 % (ref 43–77)
PLATELETS: 210 10*3/uL (ref 150–400)
RBC: 5.45 MIL/uL (ref 4.22–5.81)
RDW: 12.5 % (ref 11.5–15.5)
WBC: 8.5 10*3/uL (ref 4.0–10.5)

## 2015-07-19 LAB — URIC ACID: URIC ACID, SERUM: 7.2 mg/dL (ref 4.4–7.6)

## 2015-07-19 LAB — ANA, BODY FLUID: Anti-Nuclear Ab, IgG: NOT DETECTED

## 2015-07-19 LAB — LACTATE DEHYDROGENASE: LDH: 1163 U/L — ABNORMAL HIGH (ref 98–192)

## 2015-07-19 MED ORDER — GI COCKTAIL ~~LOC~~
30.0000 mL | Freq: Three times a day (TID) | ORAL | Status: DC | PRN
  Administered 2015-07-19 – 2015-07-21 (×2): 30 mL via ORAL
  Filled 2015-07-19 (×2): qty 30

## 2015-07-19 NOTE — Progress Notes (Signed)
[facility] PROGRESS NOTE Assessment/Plan: Pleural effusion, right/abdominal mass: -CT scan of the chest with contrast shows a significant effusion with compression of the right lung. -CT scan of the abdomen showed peritoneal carcinomatosis with no lymphadenopathy -Pulmonary perform thoracocentesis. Fluid exudative bloodLDH is 6834. -Oncology has been consulted concern: include lymphoma  -IR consulted for for possible biopsy and on 9/6 performed US core biopsy of abd mass and also diagnostic/therapeutic paracentesis. Cytology/pathology pending -Oncology concern about tumor lysis syndrome, he was started on IV hydration and allopurinol. -initiation of IV steroid done in anticipation for treatment -Oncology requesting transfer to [facility] -will need central access (PICC line; will hold until patient is at [facility] and we have improvement on renal function) -will follow any further rec's from oncology service.  AKI: due to tumor lysis syndrome and contrast -will continue IVF's -will follow renal function trend -Cr 1.7 on 07/19/15  Tumor lysis syndrome: -started on allopurinol -will follow CMP and uric acid level -continue aggressive IVF's  Screening process: -non reactive HIV -CMV IGM and antibody neg -EBV results (DNA by PCR and VCA antibody) pending  -ESR 6 -CRP 4.3 -hepatitis panel neg -AFP 4.5 -HCG tumor marker < 1  Barton Dubois [phone]   DVT prophylaxis: on heparin  Code Status: Full   Consultants:  IR Oncology   Procedures:  Thoracocentesis  Paracentesis   US guided core biopsy  Antibiotics:  None  HPI/Subjective: No fever, no CP, no SOB. Reports some reflux symptoms  Objective: Filed Vitals:   07/18/15 0443 07/18/15 1458 07/18/15 2020 07/19/15 0623  BP: 125/72 138/94 128/78 138/92  Pulse: 98 103 96 85  Temp: 98.4 F (36.9 C) 98.4 F (36.9 C) 98.7 F (37.1 C) 98.2 F (36.8 C)  TempSrc: Oral Oral Oral Oral  Resp: _0 Height:        Weight:      SpO2: 90% 97% 100% 95%    Intake/Output Summary (Last 24 hours) at 07/19/15 1335 Last data filed at 07/19/15 0700  Gross per 24 hour  Intake 2092.5 ml  Output    200 ml  Net 1892.5 ml   Filed Weights   07/14/15 2306  Weight: 93.8 kg (206 lb 12.7 oz)    Exam:  General: Alert, awake, oriented x3, in no acute distress. Afebrile and reporting pain is well controlled overall. Had some reflux symptoms, which improved with use of GI cocktail  HEENT: No bruits, no goiter; no JVD Heart: Regular rate, no rubs, no gallops, no murmurs  Lungs: Good air movement, no wheezing, no rales Abdomen: Soft, slightly tender to palpation tight side, soft, positive BS, mild distension.  Neuro: Grossly intact, nonfocal.   Data Reviewed: Basic Metabolic Panel:  Recent Labs Lab 07/15/15 0440 07/16/15 1136 07/17/15 0342 07/18/15 0920 07/19/15 1019  NA 135 138 139 136 137  K 3.9 4.3 4.2 4.0 4.1  CL 99* 104 103 95* 101  CO2 24 20* _1 GLUCOSE 67 80 85 118* 91  BUN _2 CREATININE 1.22 1.18 1.18 1.33* 1.72*  CALCIUM 8.0* 8.2* 8.1* 8.2* 8.0*  PHOS  --  2.7  --  2.1*  --    Liver Function Tests:  Recent Labs Lab 07/15/15 0440 07/15/15 1050 07/16/15 1136 07/17/15 0342 07/18/15 0920 07/19/15 1019  AST 66*  --  76* 86* 118* 110*  ALT 44  --  48 54 85* 80*  ALKPHOS 53  --  49 44 50  48  BILITOT 0.9  --  0.9 0.7 0.8 0.9  PROT 5.7* 5.7* 5.4* 4.8* 5.3* 5.0*  ALBUMIN 3.1*  --  3.0* 2.7* 2.8* 2.7*   CBC:  Recent Labs Lab 07/14/15 1644 07/15/15 0440 07/17/15 0342 07/18/15 0920 07/19/15 1019  WBC 7.0 7.6 7.1 6.8 8.5  NEUTROABS  --  4.3 4.5 4.2 5.9  HGB 15.7 15.0 14.4 16.0 16.0  HCT 46.1 44.7 41.6 46.4 46.7  MCV 85.2 86.5 85.1 86.1 85.7  PLT 216 222 199 200 210   ProBNP (last 3 results) No results for input(s): PROBNP in the last 8760 hours.  CBG: No results for input(s): GLUCAP in the last 168 hours.  Recent Results (from the past 240 hour(s))   AFB culture with smear     Status: None (Preliminary result)   Collection Time: 07/15/15  3:23 PM  Result Value Ref Range Status   Specimen Description PLEURAL RIGHT FLUID  Final   Special Requests PLEURAL RIGHT FLUID  Final   Acid Fast Smear   Final    NO ACID FAST BACILLI SEEN Performed at [facility]    Culture   Final    CULTURE WILL BE EXAMINED FOR 6 WEEKS BEFORE ISSUING A FINAL REPORT Performed at [facility]    Report Status PENDING  Incomplete  Fungus Culture with Smear     Status: None (Preliminary result)   Collection Time: 07/15/15  3:23 PM  Result Value Ref Range Status   Specimen Description PLEURAL RIGHT FLUID  Final   Special Requests PLEURAL RIGHT FLUID  Final   Fungal Smear   Final    NO YEAST OR FUNGAL ELEMENTS SEEN Performed at [facility]    Culture   Final    CULTURE IN PROGRESS FOR FOUR WEEKS Performed at [facility]    Report Status PENDING  Incomplete  Culture, body fluid-bottle     Status: None (Preliminary result)   Collection Time: 07/15/15  3:23 PM  Result Value Ref Range Status   Specimen Description FLUID PLEURAL  Final   Special Requests BOTTLES DRAWN AEROBIC AND ANAEROBIC 5CC  Final   Culture NO GROWTH 4 DAYS  Final   Report Status PENDING  Incomplete  Gram stain     Status: None   Collection Time: 07/15/15  3:23 PM  Result Value Ref Range Status   Specimen Description FLUID PLEURAL  Final   Special Requests NONE  Final   Gram Stain   Final    ABUNDANT WBC PRESENT,BOTH PMN AND MONONUCLEAR NO ORGANISMS SEEN GRAM STAIN REVIEWED-AGREE WITH RESULT K WOOLEN    Report Status 07/15/2015 FINAL  Final     Studies: Us Biopsy  07/17/2015   CLINICAL DATA:  Extensive peritoneal nodularity and omental caking of uncertain etiology. Periumbilical masses.  EXAM: ULTRASOUND-GUIDED CORE PERIUMBILICAL BIOPSY  TECHNIQUE: An ultrasound guided biopsy was thoroughly discussed with the patient and questions were answered.  The benefits, risks, alternatives, and complications were also discussed. The patient understands and wishes to proceed with the procedure. A verbal as well as written consent was obtained.  Survey ultrasound of the periumbilical region was performed and an appropriate skin entry site was determined. Skin site was marked, prepped with chlorhexidine, and draped in usual sterile fashion, and infiltrated locally with 1% lidocaine.  Intravenous Fentanyl and Versed were administered as conscious sedation during continuous cardiorespiratory monitoring by the radiology RN, with a total moderate sedation time of less than 30 minutes.    Under real-time ultrasound guidance, a 17 gauge trocar needle was advanced to the margin of the lesion. Once needle tip position was confirmed, coaxial 18-gauge core biopsy samples were obtained, submitted in saline to surgical pathology. The guide needle was removed. Postprocedure scans show no hemorrhage or other apparent complication.  COMPLICATIONS: COMPLICATIONS None immediate  FINDINGS: Nodular periumbilical subcutaneous masses were localized. Core biopsy samples were obtained without complication.  IMPRESSION: 1. Technically successful ultrasound guided core periumbilical nodule biopsy.   Electronically Signed   By: D  Hassell M.D.   On: 07/17/2015 16:15   Us Paracentesis  07/17/2015   CLINICAL DATA:  Omental disease and peritoneal nodules.  EXAM: ULTRASOUND GUIDED PARACENTESIS  TECHNIQUE: The procedure, risks (including but not limited to bleeding, infection, organ damage ), benefits, and alternatives were explained to the patient. Questions regarding the procedure were encouraged and answered. The patient understands and consents to the procedure. Survey ultrasound of the abdomen was performed and an appropriate skin entry site in the right lower abdomen was selected. Skin site was marked, prepped with Betadine, and draped in usual sterile fashion, and infiltrated locally with 1%  lidocaine. A 5 French multisidehole Yueh sheath needle was advanced into the peritoneal space until fluid could be aspirated. The sheath was advanced and the needle removed. 1.2 L of cloudy yellow ascites were aspirated. A sample sent to cytology for the requested studies.  COMPLICATIONS: COMPLICATIONS none  IMPRESSION: Technically successful ultrasound guided paracentesis, removing 1.2 L of ascites. Sample to cytology.   Electronically Signed   By: D  Hassell M.D.   On: 07/17/2015 16:17    Scheduled Meds: . allopurinol  300 mg Oral BID   Continuous Infusions: . sodium chloride 10 mL/hr at 07/15/15 1023  . sodium chloride 125 mL/hr at 07/18/15 2329    Time Spent: 30 min   ,   [facility] Pager [phone] If 7PM-7AM, please contact night-coverage at [email or web address], password [email or web address] 07/19/2015, 1:35 PM  LOS: 5 days       

## 2015-07-19 NOTE — Progress Notes (Signed)
Donald [name]   DOB:[birth year]   ON#:[id number]   KGM#:[id number]  Subjective: Patient seen and examined. Denies fevers, chills,he does report frequent night sweats. Denies vision changes, or mucositis. Denies shortness of breath or cough. He feels effusion is returning in right chest and right abdomen, causing discomfort.  Denies any cardiac complaints or palpitations. Denies lower extremity swelling. Denies nausea, heartburn or change in bowel habits. Appetite is normal. Denies any dysuria. Denies abnormal skin rashes, or neuropathy. Denies any bleeding issues such as epistaxis, hematemesis, hematuria or hematochezia. Ambulating without difficulty.  Scheduled Meds: . allopurinol  300 mg Oral BID   Continuous Infusions: . sodium chloride 10 mL/hr at 07/15/15 1023  . sodium chloride 125 mL/hr at 07/18/15 2329   PRN Meds:.gi cocktail, HYDROcodone-acetaminophen, LORazepam, morphine injection, ondansetron (ZOFRAN) IV  Objective:  Filed Vitals:   07/19/15 0623  BP: 138/92  Pulse: 85  Temp: 98.2 F (36.8 C)  Resp: 18    Body mass index is 28.04 kg/(m^2).  Intake/Output Summary (Last 24 hours) at 07/19/15 1105 Last data filed at 07/19/15 0700  Gross per 24 hour  Intake 2092.5 ml  Output    200 ml  Net 1892.5 ml     GENERAL:alert, in no acute distress and comfortable SKIN: skin color, texture, turgor are normal, no rashes or significant lesions EYES: normal, conjunctiva are pink and non-injected, sclera clear OROPHARYNX:no exudate, no erythema and lips, buccal mucosa, and tongue normal  NECK: supple, no JVD, thyroid normal size, non-tender, without nodularity LYMPH: no palpable lymphadenopathy in the cervical, axillary or inguinal LUNGS: clear to auscultation, decreased air entry right lung base about  HEART: regular rate & rhythm, no murmurs and no lower extremity edema ABDOMEN: Abdomen mildly distended. Still with periumbilical induration. Musculoskeletal: no cyanosis of digits and  no clubbing  PSYCH: alert & oriented x 3 with fluent speech NEURO: no focal motor/sensory deficits    CBG (last 3)  No results for input(s): GLUCAP in the last 72 hours.    Labs:   Recent Labs Lab 07/12/15 1234 07/14/15 1644 07/15/15 0440 07/17/15 0342 07/18/15 0920 07/19/15 1019  WBC 7.0 7.0 7.6 7.1 6.8 8.5  HGB 15.3 15.7 15.0 14.4 16.0 16.0  HCT 45.0 46.1 44.7 41.6 46.4 46.7  PLT 220.0 216 222 199 200 210  MCV 87.6 85.2 86.5 85.1 86.1 85.7  MCH  --  29.0 29.0 29.4 29.7 29.4  MCHC 34.1 34.1 33.6 34.6 34.5 34.3  RDW 13.0 12.3 12.5 12.4 12.5 12.5  LYMPHSABS 1.7  --  2.2 1.3 1.5 1.6  MONOABS 0.6  --  0.6 0.8 0.8 0.9  EOSABS 0.2  --  0.4 0.3 0.2 0.2  BASOSABS 0.1  --  0.0 0.0 0.1 0.1     Chemistries:    Recent Labs Lab 07/15/15 0440 07/16/15 1136 07/17/15 0342 07/18/15 0920 07/19/15 1019  NA 135 138 139 136 137  K 3.9 4.3 4.2 4.0 4.1  CL 99* 104 103 95* 101  CO2 24 20* _0 GLUCOSE 67 80 85 118* 91  BUN _1 CREATININE 1.22 1.18 1.18 1.33* 1.72*  CALCIUM 8.0* 8.2* 8.1* 8.2* 8.0*  AST 66* 76* 86* 118* 110*  ALT 44 48 54 85* 80*  ALKPHOS 53 49 44 50 48  BILITOT 0.9 0.9 0.7 0.8 0.9      GFR Estimated Creatinine Clearance: 87.8 mL/min (by C-G formula based on Cr of 1.33). Liver Function Tests:  Recent Labs Lab  07/15/15 0440 07/15/15 1050 07/16/15 1136 07/17/15 0342 07/18/15 0920 07/19/15 1019  AST 66*  --  76* 86* 118* 110*  ALT 44  --  48 54 85* 80*  ALKPHOS 53  --  49 44 50 48  BILITOT 0.9  --  0.9 0.7 0.8 0.9  PROT 5.7* 5.7* 5.4* 4.8* 5.3* 5.0*  ALBUMIN 3.1*  --  3.0* 2.7* 2.8* 2.7*    Coagulation profile  Recent Labs Lab 07/15/15 0440  INR 1.18     Anemia work up  Recent Labs  07/18/15 0920  RETICCTPCT 1.1    Microbiology Cultures pending.  Studies:  US Biopsy  07/17/2015   CLINICAL DATA:  Extensive peritoneal nodularity and omental caking of uncertain etiology. Periumbilical masses.  EXAM:  ULTRASOUND-GUIDED CORE PERIUMBILICAL BIOPSY  TECHNIQUE: An ultrasound guided biopsy was thoroughly discussed with the patient and questions were answered. The benefits, risks, alternatives, and complications were also discussed. The patient understands and wishes to proceed with the procedure. A verbal as well as written consent was obtained.  Survey ultrasound of the periumbilical region was performed and an appropriate skin entry site was determined. Skin site was marked, prepped with chlorhexidine, and draped in usual sterile fashion, and infiltrated locally with 1% lidocaine.  Intravenous Fentanyl and Versed were administered as conscious sedation during continuous cardiorespiratory monitoring by the radiology RN, with a total moderate sedation time of less than 30 minutes.  Under real-time ultrasound guidance, a 17 gauge trocar needle was advanced to the margin of the lesion. Once needle tip position was confirmed, coaxial 18-gauge core biopsy samples were obtained, submitted in saline to surgical pathology. The guide needle was removed. Postprocedure scans show no hemorrhage or other apparent complication.  COMPLICATIONS: COMPLICATIONS None immediate  FINDINGS: Nodular periumbilical subcutaneous masses were localized. Core biopsy samples were obtained without complication.  IMPRESSION: 1. Technically successful ultrasound guided core periumbilical nodule biopsy.   Electronically Signed   By: Lucrezia Europe M.D.   On: 07/17/2015 16:15   US Paracentesis  07/17/2015   CLINICAL DATA:  Omental disease and peritoneal nodules.  EXAM: ULTRASOUND GUIDED PARACENTESIS  TECHNIQUE: The procedure, risks (including but not limited to bleeding, infection, organ damage ), benefits, and alternatives were explained to the patient. Questions regarding the procedure were encouraged and answered. The patient understands and consents to the procedure. Survey ultrasound of the abdomen was performed and an appropriate skin entry site in  the right lower abdomen was selected. Skin site was marked, prepped with Betadine, and draped in usual sterile fashion, and infiltrated locally with 1% lidocaine. A 5 French multisidehole Yueh sheath needle was advanced into the peritoneal space until fluid could be aspirated. The sheath was advanced and the needle removed. 1.2 L of cloudy yellow ascites were aspirated. A sample sent to cytology for the requested studies.  COMPLICATIONS: COMPLICATIONS none  IMPRESSION: Technically successful ultrasound guided paracentesis, removing 1.2 L of ascites. Sample to cytology.   Electronically Signed   By: Lucrezia Europe M.D.   On: 07/17/2015 16:17    Screening process: -non reactive HIV -CMV IGM and antibody neg -EBV results (DNA by PCR  Antibody negative.  -ESR 6 -CRP 4.3 -hepatitis panel negative -AFP 4.5 -HCG tumor marker < 1  Assessment / Plan:   High Grade Lymphoma Pathology Flow cytometry signature consistent with high grade B-cell lymphoma but inadequate tissue for definitive diagnosis.  Patient very high risk for tumor lysis.  Plan  -Patient needs excisional LN biopsy by surgery  ASAP for best shot at rapid diagnosis Alternatively he needs better IR guided core needle biopsies with bed side intra-procedure assessment by pathology of sample adequacy for lymphoma workup. He will need a PET scan as soon as possible to rule out occult disease and for treatment options. The need for this imaging study is emergent.    Recommend transfer to [facility] for continuation of care, chemotherapy plans and and imaging studies.   Continue daily daily CBC Continue twice daily LDH, today at 1163, improved from prior. Uric acid improving with allopurinol at 7.2  patient will likely need central line access (consider PICC line) Continue steroids IV steroids pending definitive diagnosis Continue Allopurinol BID and IVF and electrolyte management as needed.  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 Related to allopurinol Plan -previous biopsy inadequate quantity for definitive diagnosis -consult general surgery and discussed case with Dr Lucia Gaskins for possibility of laparoscopic excisional biopsy - he notes that he will review the images and discuss if this might be possible tomorrow with Dr Dalbert Batman. -patient needs stat PET/CT for staging purposes and treatment planning as inpatient since he cannot be discharged and will likely need to start treatment as inpatient. I discuss the need for this with the nuclear medicine supervisor and she notes she will discuss it with the division administration. Patient even offered to pay for the PET/CT if needed. I told the radiology manager to talk to patient if they cannot do PET/CT and place a note in the chart to that effect so I can weigh other options if still not possible. -stat US abd ordered to check for hydronephrosis and check biliary tract -held allopurinol - monitor liver functions and renal function - might need to switch to Uloric and if increasing uric acid might consider rasburicase -aggressive IVF's (needs to maintain atleast 2L/24h urine output) -PICC line for better IV access for labs/fluids/treatment. -once adequate tissue obtained will start patient on high dose steroids pending definitive diagnosis. -Discussed with the patient the option of sperm banking with [facility]. Patient and his fiance noted that he has 4 boys and does not intend to have more children and he not interested in sperm  banking. -will continue to f/u daily  Sullivan Lone MD MS  TT 28mns >50% on direct patient contact and CC with surgery and radiology

## 2015-07-19 NOTE — Consult Note (Signed)
Re:   Donald [name] DOB:   1976-06-20 MRN:   [id number]   Consultation [facility]  ASSESSMENT AND PLAN: 1.  Possible lymphoma, though primary intra-abdominal carcinoma (small bowel, etc) a possibility.      Omental caking and lymphadenopathy on CT scan.  Discussed earlier with Dr. Irene Limbo.  He needs a diagnosis.  It appears that this tumor would be amendable to laparoscopic biopsy.  I discussed open and laparoscopic surgery with the patient.  He also has ascites which could be drained, but would probably rapidly recur.  Dr. Dalbert Batman is our surgeon of the week and I will talk to him about the patient in the AM.   2.  Possible tumor lysis syndrome.  3.  Right pleural effusion  4.  Acute renal injury - Creat - 1.72 - 07/19/2015  Chief Complaint  Patient presents with  . Flank Pain   REFERRING PHYSICIAN:   Dr. Carolyne Fiscal, oncology  HISTORY OF PRESENT ILLNESS: Donald [name] is a 39 y.o. (DOB: 04-24-1976)  white  male whose primary care physician is Drema Pry, DO.  He has not seen Dr. Shawna Orleans in years.  He sees Dr. Conception Chancy, [place], a naturopath who is in [place].   Dr. Irene Limbo spoke to me about obtaining more tissue to make a diagnosis.  The patient started feeling bad about 2 weeks ago - this was after riding his bike.  He had some ill defined back pain.   He had some shortness of breath.  At first he thought that he had pneumonia.  A CXR showed a right pleural effusion.  But then the CT scan of his abdomen showed what appears to be widespread carcinomatosis. He was admitted to [facility] on 07/14/2015, then transferred to [facility] yesterday, 9/7.  He has not prior history of stomach, liver, or colon disease.  He has not history of abdominal surgery.  The pathology from 07/18/2015 - [id number] - insufficient cellular material. Biopsy from 07/17/2015 - [id number] - limited atypical lymphoid tissue, not diagnostic  CT abdomen/pelvis - 07/15/2015 - 1. Widespread intraperitoneal malignancy, as  demonstrated by extensive peritoneal nodularity and enhancement, widespread omental caking, likely malignant ascites and extensive mesenteric lymphadenopathy. The exact source of malignancy is uncertain, however, given the focally thickened loop of small bowel (likely jejunal) in the left side of the central abdomen (image 56 of series 2), a primary small bowel malignancy is suspected.    2. Small bilateral pleural effusions (left greater than right).  Right-sided pleural effusion significantly decreased following thoracentesis. Resolving subsegmental atelectasis in the right lower lobe.  LDH - 1,163 - 07/19/2015    Past Medical History  Diagnosis Date  . EBV infection 2013    Patient notes that he had an EBV infection in 2013 that left him with chronic fatigue. She also notes that he had significant lymphadenopathy and thyroiditis at the time. He has been managing his symptoms with an alternative medicine practitioner in [place] and with other alternative medicines.  . Marijuana use, episodic       Past Surgical History  Procedure Laterality Date  . Tonsillectomy    . Appendectomy        Current Facility-Administered Medications  Medication Dose Route Frequency Provider Last Rate Last Dose  . 0.9 %  sodium chloride infusion   Intravenous Continuous Charlynne Cousins, MD 10 mL/hr at 07/15/15 1023    . 0.9 %  sodium chloride infusion   Intravenous Continuous Gautam Juleen China, MD 125 mL/hr at  07/19/15 1753    . allopurinol (ZYLOPRIM) tablet 300 mg  300 mg Oral BID Brunetta Genera, MD   300 mg at 07/19/15 1012  . gi cocktail (Maalox,Lidocaine,Donnatal)  30 mL Oral TID PRN Barton Dubois, MD   30 mL at 07/19/15 1012  . HYDROcodone-acetaminophen (NORCO/VICODIN) 5-325 MG per tablet 1-2 tablet  1-2 tablet Oral Q4H PRN Arne Cleveland, MD   1 tablet at 07/19/15 2004  . LORazepam (ATIVAN) tablet 1 mg  1 mg Oral Q6H PRN Charlynne Cousins, MD   1 mg at 07/19/15 0211  . morphine 2 MG/ML  injection 2 mg  2 mg Intravenous Q3H PRN Gennaro Africa, MD   2 mg at 07/19/15 0206  . ondansetron (ZOFRAN) injection 4 mg  4 mg Intravenous Q6H PRN Barton Dubois, MD   4 mg at 07/19/15 1536      Allergies  Allergen Reactions  . Fluoride Preparations Other (See Comments)    Patient prefers not to take fluoride based antibiotics like Levaquin  . Sulfa Antibiotics Other (See Comments)    Unknown, Patient was a child and does not remember the reaction    REVIEW OF SYSTEMS: Skin:  No history of rash.  No history of abnormal moles. Infection:  No history of hepatitis or HIV.  No history of MRSA. Neurologic:  No history of stroke.  No history of seizure.  No history of headaches. Cardiac:  No history of hypertension. No history of heart disease.  No history of prior cardiac catheterization.  No history of seeing a cardiologist. Pulmonary:  Does not smoke cigarettes.  No asthma or bronchitis.  No OSA/CPAP.  Endocrine:  No diabetes. No thyroid disease. Gastrointestinal:  No history of stomach disease.  No history of liver disease.  No history of gall bladder disease.  No history of pancreas disease.  No history of colon disease. Urologic:  No history of kidney stones.  No history of bladder infections. Musculoskeletal:  No history of joint or back disease. Hematologic:  No bleeding disorder.  No history of anemia.  Not anticoagulated. Psycho-social:  The patient is oriented.   The patient has no obvious psychologic or social impairment to understanding our conversation and plan.  SOCIAL and FAMILY HISTORY: Married. 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 Has 4 children. Brother, Ronalee Belts, is in the room.  PHYSICAL EXAM: BP 136/82 mmHg  Pulse 95  Temp(Src) 99.3 F (37.4 C) (Oral)  Resp 20  Ht 5\' 11"  (1.803 m)  Wt 94.348 kg (208 lb)  BMI 29.02 kg/m2  SpO2 96%  General: WN Wm who is alert.  Diaphoretic. HEENT: Normal. Pupils equal. Neck: Supple. No mass.  No thyroid mass. Lymph  Nodes:  No supraclavicular or cervical nodes. Lungs: Clear to auscultation and symmetric breath sounds. Heart:  RRR. No murmur or rub. Abdomen: Distended.  Mild tenderness.  Has bandaid in left mid abdomen. Rectal: Not done. Extremities:  Good strength and ROM  in upper and lower extremities. Neurologic:  Grossly intact to motor and sensory function. Psychiatric: Has normal mood and affect. Behavior is normal.   DATA REVIEWED: Epic notes.  Alphonsa Overall, MD,  [facility], [address]  [place], [place]    Hall Phone:  [phone] FAX:  [phone]

## 2015-07-19 NOTE — Progress Notes (Addendum)
Hematology/Oncology Short Note  07/19/2015  1) Prelim -pathology -- lots of large necrotic cells, flow with clonal B-cell population with sIg production (Likely high grade Lymphoma - ?[place]. Given sIg expression unlikely ALL. Plan -labs were not ordered for this AM (I placed them stat) - plz order daily CBC and twice daily LDH, uric acid, bmp. Patient very high risk for tumor lysis. -patient will likely need central line access (consider PICC line) -patient needs PET/CT stat (plz help make this happen -- has been pending for several days despite stat order) -pathologist will tell me this AM if theyhave adequate tissue for definitive diagnosis or need additional biopsy given significant amount of necrotic material in the biopsy (not uncommon with aggressive lymphomas) -if adequate sample - will start steroids pending definitive diagnosis -continue Allopurinol BID and IVF and electrolyte management as needed. -plan to transfer patient to [facility] considering he will likely need inpatient chemotherapy.  Plz call if any additional questions.  Sullivan Lone MD [place] [facility] Hematology/Oncology Physician [place]  (Office):       [phone] (Work cell):  [phone] (Fax):           [phone]    ADDENDUM  Talked to pathology. Flow cytometry signature consistent with high grade B-cell lymphoma but inadequate tissue for definitive diagnosis.  -Patient needs excisional LN biopsy by surgery ASAP for best shot at rapid diagnosis .[name] KitchenMarland KitchenAlternatively he needs better IR guided core needle biopsies with bed side intra-procedure assessment by pathology of sample adequacy for lymphoma workup.  -will inform Hospitalist to help co-ordinate this.  Sullivan Lone MD MS Hematology/Oncology Physician [facility]

## 2015-07-19 NOTE — Progress Notes (Signed)
Admitted from [facility] via [facility], awake, alert, oriented, no distress but claimed he got a lot of abdominal discomforts.

## 2015-07-20 ENCOUNTER — Encounter (HOSPITAL_COMMUNITY): Payer: Self-pay | Admitting: Anesthesiology

## 2015-07-20 ENCOUNTER — Encounter (HOSPITAL_COMMUNITY)
Admission: EM | Disposition: A | Discharge status: Home / Self Care | Payer: Self-pay | Source: Home / Self Care | Attending: Internal Medicine

## 2015-07-20 ENCOUNTER — Inpatient Hospital Stay (HOSPITAL_COMMUNITY): Payer: BLUE CROSS/BLUE SHIELD | Admitting: Anesthesiology

## 2015-07-20 ENCOUNTER — Inpatient Hospital Stay (HOSPITAL_COMMUNITY): Payer: BLUE CROSS/BLUE SHIELD

## 2015-07-20 DIAGNOSIS — R7989 Other specified abnormal findings of blood chemistry: Secondary | ICD-10-CM

## 2015-07-20 DIAGNOSIS — N179 Acute kidney failure, unspecified: Secondary | ICD-10-CM

## 2015-07-20 DIAGNOSIS — J9601 Acute respiratory failure with hypoxia: Secondary | ICD-10-CM

## 2015-07-20 HISTORY — PX: LAPAROSCOPY: SHX197

## 2015-07-20 LAB — CBC WITH DIFFERENTIAL/PLATELET
BASOS ABS: 0 10*3/uL (ref 0.0–0.1)
Basophils Absolute: 0.1 10*3/uL (ref 0.0–0.1)
Basophils Relative: 1 % (ref 0–1)
Basophils Relative: 0 % (ref 0–1)
EOS PCT: 4 % (ref 0–5)
Eosinophils Absolute: 0.4 10*3/uL (ref 0.0–0.7)
Eosinophils Absolute: 0 10*3/uL (ref 0.0–0.7)
Eosinophils Relative: 0 % (ref 0–5)
HCT: 43.1 % (ref 39.0–52.0)
HEMATOCRIT: 41.9 % (ref 39.0–52.0)
HEMOGLOBIN: 14.1 g/dL (ref 13.0–17.0)
Hemoglobin: 14.7 g/dL (ref 13.0–17.0)
LYMPHS ABS: 1.9 10*3/uL (ref 0.7–4.0)
LYMPHS PCT: 21 % (ref 12–46)
LYMPHS PCT: 12 % (ref 12–46)
Lymphs Abs: 1.1 10*3/uL (ref 0.7–4.0)
MCH: 29.5 pg (ref 26.0–34.0)
MCH: 29.3 pg (ref 26.0–34.0)
MCHC: 34.1 g/dL (ref 30.0–36.0)
MCHC: 33.7 g/dL (ref 30.0–36.0)
MCV: 86.4 fL (ref 78.0–100.0)
MCV: 86.9 fL (ref 78.0–100.0)
MONO ABS: 1.3 10*3/uL — AB (ref 0.1–1.0)
MONO ABS: 0 10*3/uL — AB (ref 0.1–1.0)
Monocytes Relative: 14 % — ABNORMAL HIGH (ref 3–12)
Monocytes Relative: 0 % — ABNORMAL LOW (ref 3–12)
NEUTROS ABS: 8.6 10*3/uL — AB (ref 1.7–7.7)
NEUTROS PCT: 88 % — AB (ref 43–77)
Neutro Abs: 5.8 10*3/uL (ref 1.7–7.7)
Neutrophils Relative %: 62 % (ref 43–77)
PLATELETS: 229 10*3/uL (ref 150–400)
Platelets: 231 10*3/uL (ref 150–400)
RBC: 4.99 MIL/uL (ref 4.22–5.81)
RBC: 4.82 MIL/uL (ref 4.22–5.81)
RDW: 12.6 % (ref 11.5–15.5)
RDW: 12.8 % (ref 11.5–15.5)
WBC: 9.4 10*3/uL (ref 4.0–10.5)
WBC: 9.8 10*3/uL (ref 4.0–10.5)

## 2015-07-20 LAB — CULTURE, BODY FLUID W GRAM STAIN -BOTTLE

## 2015-07-20 LAB — BLOOD GAS, ARTERIAL
Acid-base deficit: 6.4 mmol/L — ABNORMAL HIGH (ref 0.0–2.0)
Bicarbonate: 18.4 mEq/L — ABNORMAL LOW (ref 20.0–24.0)
DRAWN BY: 257701
FIO2: 1
O2 Saturation: 95.8 %
PH ART: 7.329 — AB (ref 7.350–7.450)
Patient temperature: 98.6
TCO2: 16.5 mmol/L (ref 0–100)
pCO2 arterial: 35.9 mmHg (ref 35.0–45.0)
pO2, Arterial: 94 mmHg (ref 80.0–100.0)

## 2015-07-20 LAB — CULTURE, BODY FLUID-BOTTLE: CULTURE: NO GROWTH

## 2015-07-20 LAB — COMPREHENSIVE METABOLIC PANEL
ALBUMIN: 2.8 g/dL — AB (ref 3.5–5.0)
ALT: 61 U/L (ref 17–63)
ALT: 59 U/L (ref 17–63)
AST: 78 U/L — ABNORMAL HIGH (ref 15–41)
AST: 76 U/L — AB (ref 15–41)
Albumin: 2.6 g/dL — ABNORMAL LOW (ref 3.5–5.0)
Alkaline Phosphatase: 40 U/L (ref 38–126)
Alkaline Phosphatase: 43 U/L (ref 38–126)
Anion gap: 15 (ref 5–15)
Anion gap: 17 — ABNORMAL HIGH (ref 5–15)
BILIRUBIN TOTAL: 0.6 mg/dL (ref 0.3–1.2)
BUN: 22 mg/dL — ABNORMAL HIGH (ref 6–20)
BUN: 24 mg/dL — AB (ref 6–20)
CALCIUM: 7.5 mg/dL — AB (ref 8.9–10.3)
CHLORIDE: 102 mmol/L (ref 101–111)
CHLORIDE: 104 mmol/L (ref 101–111)
CO2: 21 mmol/L — ABNORMAL LOW (ref 22–32)
CO2: 20 mmol/L — ABNORMAL LOW (ref 22–32)
CREATININE: 1.54 mg/dL — AB (ref 0.61–1.24)
Calcium: 7.7 mg/dL — ABNORMAL LOW (ref 8.9–10.3)
Creatinine, Ser: 1.39 mg/dL — ABNORMAL HIGH (ref 0.61–1.24)
GFR calc Af Amer: >60 mL/min (ref >60)
GFR, EST NON AFRICAN AMERICAN: 55 mL/min — AB (ref >60)
Glucose, Bld: 76 mg/dL (ref 65–99)
Glucose, Bld: 111 mg/dL — ABNORMAL HIGH (ref 65–99)
POTASSIUM: 4.8 mmol/L (ref 3.5–5.1)
Potassium: 4.2 mmol/L (ref 3.5–5.1)
Sodium: 138 mmol/L (ref 135–145)
Sodium: 141 mmol/L (ref 135–145)
TOTAL PROTEIN: 4.9 g/dL — AB (ref 6.5–8.1)
Total Bilirubin: 1.2 mg/dL (ref 0.3–1.2)
Total Protein: 5.2 g/dL — ABNORMAL LOW (ref 6.5–8.1)

## 2015-07-20 LAB — URIC ACID
URIC ACID, SERUM: 6.9 mg/dL (ref 4.4–7.6)
Uric Acid, Serum: 7.5 mg/dL (ref 4.4–7.6)

## 2015-07-20 LAB — PROTIME-INR
INR: 1.21 (ref 0.00–1.49)
PROTHROMBIN TIME: 15.4 s — AB (ref 11.6–15.2)

## 2015-07-20 LAB — PHOSPHORUS
PHOSPHORUS: 6 mg/dL — AB (ref 2.5–4.6)
Phosphorus: 5.8 mg/dL — ABNORMAL HIGH (ref 2.5–4.6)

## 2015-07-20 LAB — SURGICAL PCR SCREEN
MRSA, PCR: NEGATIVE
Staphylococcus aureus: NEGATIVE

## 2015-07-20 LAB — APTT: aPTT: 29 seconds (ref 24–37)

## 2015-07-20 LAB — T4, FREE: FREE T4: 1.59 ng/dL — AB (ref 0.61–1.12)

## 2015-07-20 LAB — TSH: TSH: 5.616 u[IU]/mL — ABNORMAL HIGH (ref 0.350–4.500)

## 2015-07-20 LAB — MAGNESIUM: MAGNESIUM: 1.8 mg/dL (ref 1.7–2.4)

## 2015-07-20 LAB — SEDIMENTATION RATE: SED RATE: 25 mm/h — AB (ref 0–16)

## 2015-07-20 LAB — LACTIC ACID, PLASMA: Lactic Acid, Venous: 2.5 mmol/L (ref 0.5–2.0)

## 2015-07-20 LAB — C-REACTIVE PROTEIN: CRP: 13.7 mg/dL — AB (ref <1.0)

## 2015-07-20 LAB — BRAIN NATRIURETIC PEPTIDE: B NATRIURETIC PEPTIDE 5: 13.9 pg/mL (ref 0.0–100.0)

## 2015-07-20 SURGERY — LAPAROSCOPY, DIAGNOSTIC
Anesthesia: General | Site: Abdomen

## 2015-07-20 MED ORDER — ENOXAPARIN SODIUM 40 MG/0.4ML ~~LOC~~ SOLN
40.0000 mg | SUBCUTANEOUS | Status: DC
  Filled 2015-07-20 (×3): qty 0.4

## 2015-07-20 MED ORDER — GLYCOPYRROLATE 0.2 MG/ML IJ SOLN
INTRAMUSCULAR | Status: DC | PRN
  Administered 2015-07-20: 0.6 mg via INTRAVENOUS

## 2015-07-20 MED ORDER — SODIUM CHLORIDE 0.9 % IJ SOLN
INTRAMUSCULAR | Status: AC
  Filled 2015-07-20: qty 10

## 2015-07-20 MED ORDER — SODIUM CHLORIDE 0.9 % IJ SOLN
10.0000 mL | Freq: Two times a day (BID) | INTRAMUSCULAR | Status: DC
  Administered 2015-07-20 – 2015-07-21 (×3): 10 mL
  Administered 2015-07-22: 20 mL
  Administered 2015-07-23 (×2): 10 mL
  Administered 2015-07-23: 20 mL
  Administered 2015-07-24 – 2015-07-30 (×6): 10 mL

## 2015-07-20 MED ORDER — LACTATED RINGERS IV SOLN: INTRAVENOUS | Status: DC

## 2015-07-20 MED ORDER — ALLOPURINOL 300 MG PO TABS
150.0000 mg | ORAL_TABLET | Freq: Two times a day (BID) | ORAL | Status: DC
  Administered 2015-07-20 – 2015-07-30 (×20): 150 mg via ORAL
  Filled 2015-07-20 (×12): qty 1
  Filled 2015-07-20 (×2): qty 2
  Filled 2015-07-20: qty 1
  Filled 2015-07-20 (×2): qty 2
  Filled 2015-07-20 (×3): qty 1

## 2015-07-20 MED ORDER — BUPIVACAINE-EPINEPHRINE (PF) 0.5% -1:200000 IJ SOLN
INTRAMUSCULAR | Status: AC
  Filled 2015-07-20: qty 30

## 2015-07-20 MED ORDER — SODIUM CHLORIDE 0.9 % IV SOLN
INTRAVENOUS | Status: DC
  Administered 2015-07-20: 16:00:00 via INTRAVENOUS

## 2015-07-20 MED ORDER — LACTATED RINGERS IR SOLN
Status: DC | PRN
  Administered 2015-07-20: 1

## 2015-07-20 MED ORDER — CISATRACURIUM BESYLATE 20 MG/10ML IV SOLN
INTRAVENOUS | Status: AC
  Filled 2015-07-20: qty 10

## 2015-07-20 MED ORDER — LACTATED RINGERS IV SOLN
INTRAVENOUS | Status: DC | PRN
  Administered 2015-07-20: 12:00:00 via INTRAVENOUS

## 2015-07-20 MED ORDER — PROPOFOL 10 MG/ML IV BOLUS
INTRAVENOUS | Status: AC
  Filled 2015-07-20: qty 20

## 2015-07-20 MED ORDER — PROPOFOL 10 MG/ML IV BOLUS
INTRAVENOUS | Status: DC | PRN
Start: 2015-07-20 — End: 2015-07-20
  Administered 2015-07-20: 180 mg via INTRAVENOUS

## 2015-07-20 MED ORDER — HYDROMORPHONE HCL 1 MG/ML IJ SOLN: 0.2500 mg | INTRAMUSCULAR | Status: DC | PRN

## 2015-07-20 MED ORDER — LIDOCAINE HCL (CARDIAC) 20 MG/ML IV SOLN
INTRAVENOUS | Status: AC
  Filled 2015-07-20: qty 5

## 2015-07-20 MED ORDER — METHOCARBAMOL 500 MG PO TABS: 1000.0000 mg | ORAL_TABLET | Freq: Three times a day (TID) | ORAL | Status: DC | PRN

## 2015-07-20 MED ORDER — ONDANSETRON HCL 4 MG/2ML IJ SOLN
INTRAMUSCULAR | Status: DC | PRN
  Administered 2015-07-20: 4 mg via INTRAVENOUS

## 2015-07-20 MED ORDER — SODIUM CHLORIDE 0.9 % IJ SOLN: 10.0000 mL | INTRAMUSCULAR | Status: DC | PRN

## 2015-07-20 MED ORDER — EPHEDRINE SULFATE 50 MG/ML IJ SOLN
INTRAMUSCULAR | Status: AC
  Filled 2015-07-20: qty 1

## 2015-07-20 MED ORDER — BUPIVACAINE-EPINEPHRINE 0.5% -1:200000 IJ SOLN
INTRAMUSCULAR | Status: DC | PRN
  Administered 2015-07-20: 10 mL

## 2015-07-20 MED ORDER — SUCCINYLCHOLINE CHLORIDE 20 MG/ML IJ SOLN
INTRAMUSCULAR | Status: DC | PRN
  Administered 2015-07-20: 100 mg via INTRAVENOUS

## 2015-07-20 MED ORDER — SUFENTANIL CITRATE 50 MCG/ML IV SOLN
INTRAVENOUS | Status: AC
  Filled 2015-07-20: qty 1

## 2015-07-20 MED ORDER — FUROSEMIDE 10 MG/ML IJ SOLN
10.0000 mg | Freq: Once | INTRAMUSCULAR | Status: AC
  Administered 2015-07-20: 10 mg via INTRAVENOUS
  Filled 2015-07-20: qty 2

## 2015-07-20 MED ORDER — DEXAMETHASONE SODIUM PHOSPHATE 10 MG/ML IJ SOLN
INTRAMUSCULAR | Status: DC | PRN
  Administered 2015-07-20: 10 mg via INTRAVENOUS

## 2015-07-20 MED ORDER — DEXAMETHASONE SODIUM PHOSPHATE 10 MG/ML IJ SOLN
INTRAMUSCULAR | Status: AC
  Filled 2015-07-20: qty 1

## 2015-07-20 MED ORDER — ONDANSETRON HCL 4 MG/2ML IJ SOLN
INTRAMUSCULAR | Status: AC
  Filled 2015-07-20: qty 2

## 2015-07-20 MED ORDER — SUFENTANIL CITRATE 50 MCG/ML IV SOLN
INTRAVENOUS | Status: DC | PRN
  Administered 2015-07-20 (×3): 10 ug via INTRAVENOUS
  Administered 2015-07-20: 20 ug via INTRAVENOUS

## 2015-07-20 MED ORDER — LACTATED RINGERS IV SOLN
INTRAVENOUS | Status: DC | PRN
  Administered 2015-07-20: 14:00:00 via INTRAVENOUS

## 2015-07-20 MED ORDER — 0.9 % SODIUM CHLORIDE (POUR BTL) OPTIME
TOPICAL | Status: DC | PRN
  Administered 2015-07-20: 1000 mL

## 2015-07-20 MED ORDER — DEXTROSE 5 % IV SOLN
2.0000 g | INTRAVENOUS | Status: AC
  Administered 2015-07-20: 2 g via INTRAVENOUS
  Filled 2015-07-20: qty 2

## 2015-07-20 MED ORDER — MIDAZOLAM HCL 2 MG/2ML IJ SOLN
INTRAMUSCULAR | Status: AC
  Filled 2015-07-20: qty 4

## 2015-07-20 MED ORDER — LIDOCAINE HCL (CARDIAC) 20 MG/ML IV SOLN
INTRAVENOUS | Status: DC | PRN
  Administered 2015-07-20: 100 mg via INTRAVENOUS

## 2015-07-20 MED ORDER — NEOSTIGMINE METHYLSULFATE 10 MG/10ML IV SOLN
INTRAVENOUS | Status: DC | PRN
  Administered 2015-07-20: 4 mg via INTRAVENOUS

## 2015-07-20 MED ORDER — CEFOTETAN DISODIUM-DEXTROSE 2-2.08 GM-% IV SOLR
INTRAVENOUS | Status: AC
  Filled 2015-07-20: qty 50

## 2015-07-20 MED ORDER — CISATRACURIUM BESYLATE (PF) 10 MG/5ML IV SOLN
INTRAVENOUS | Status: DC | PRN
  Administered 2015-07-20: 3 mg via INTRAVENOUS
  Administered 2015-07-20: 5 mg via INTRAVENOUS

## 2015-07-20 SURGICAL SUPPLY — 32 items
BENZOIN TINCTURE PRP APPL 2/3 (GAUZE/BANDAGES/DRESSINGS) IMPLANT
CLOSURE WOUND 1/2 X4 (GAUZE/BANDAGES/DRESSINGS)
COVER SURGICAL LIGHT HANDLE (MISCELLANEOUS) ×3 IMPLANT
DECANTER SPIKE VIAL GLASS SM (MISCELLANEOUS) IMPLANT
DERMABOND ADVANCED (GAUZE/BANDAGES/DRESSINGS) ×2
DERMABOND ADVANCED .7 DNX12 (GAUZE/BANDAGES/DRESSINGS) ×1 IMPLANT
DRAPE LAPAROSCOPIC ABDOMINAL (DRAPES) ×3 IMPLANT
DRAPE POUCH INSTRU U-SHP 10X18 (DRAPES) ×3 IMPLANT
ELECT REM PT RETURN 9FT ADLT (ELECTROSURGICAL) ×3
ELECTRODE REM PT RTRN 9FT ADLT (ELECTROSURGICAL) ×1 IMPLANT
GLOVE BIOGEL PI IND STRL 7.0 (GLOVE) ×1 IMPLANT
GLOVE BIOGEL PI INDICATOR 7.0 (GLOVE) ×2
GLOVE EUDERMIC 7 POWDERFREE (GLOVE) ×3 IMPLANT
GOWN STRL REUS W/TWL LRG LVL3 (GOWN DISPOSABLE) ×3 IMPLANT
GOWN STRL REUS W/TWL XL LVL3 (GOWN DISPOSABLE) ×6 IMPLANT
KIT BASIN OR (CUSTOM PROCEDURE TRAY) ×3 IMPLANT
SET IRRIG TUBING LAPAROSCOPIC (IRRIGATION / IRRIGATOR) ×3 IMPLANT
SHEARS HARMONIC ACE PLUS 36CM (ENDOMECHANICALS) ×3 IMPLANT
SOLUTION ANTI FOG 6CC (MISCELLANEOUS) ×3 IMPLANT
STRIP CLOSURE SKIN 1/2X4 (GAUZE/BANDAGES/DRESSINGS) IMPLANT
SUT MNCRL AB 4-0 PS2 18 (SUTURE) ×3 IMPLANT
SUT VIC AB 4-0 PS2 27 (SUTURE) IMPLANT
TOWEL OR 17X26 10 PK STRL BLUE (TOWEL DISPOSABLE) ×6 IMPLANT
TRAY FOLEY W/METER SILVER 14FR (SET/KITS/TRAYS/PACK) IMPLANT
TRAY FOLEY W/METER SILVER 16FR (SET/KITS/TRAYS/PACK) IMPLANT
TRAY LAPAROSCOPIC (CUSTOM PROCEDURE TRAY) ×3 IMPLANT
TROCAR BLADELESS OPT 5 75 (ENDOMECHANICALS) IMPLANT
TROCAR XCEL BLUNT TIP 100MML (ENDOMECHANICALS) ×3 IMPLANT
TROCAR XCEL NON-BLD 11X100MML (ENDOMECHANICALS) ×3 IMPLANT
TROCAR XCEL UNIV SLVE 11M 100M (ENDOMECHANICALS) IMPLANT
TUBING INSUFFLATION 10FT LAP (TUBING) ×3 IMPLANT
WATER STERILE IRR 1500ML POUR (IV SOLUTION) ×3 IMPLANT

## 2015-07-20 NOTE — Anesthesia Procedure Notes (Signed)
Procedure Name: Intubation Date/Time: 07/20/2015 1:28 PM Performed by: Danley Danker L Patient Re-evaluated:Patient Re-evaluated prior to inductionOxygen Delivery Method: Circle system utilized Preoxygenation: Pre-oxygenation with 100% oxygen Intubation Type: IV induction, Cricoid Pressure applied and Rapid sequence Laryngoscope Size: Miller and 3 Grade View: Grade I Tube type: Oral Tube size: 8.0 mm Number of attempts: 1 Airway Equipment and Method: Stylet Placement Confirmation: ETT inserted through vocal cords under direct vision,  breath sounds checked- equal and bilateral and positive ETCO2 Secured at: 22 cm Tube secured with: Tape Dental Injury: Teeth and Oropharynx as per pre-operative assessment

## 2015-07-20 NOTE — Progress Notes (Signed)
Gen. surgery attending:  I have reviewed this patient's history and interviewed him and examined him this morning I have discussed his presentation with Dr. Alphonsa Overall, and wrist have reviewed all of the physician notes in the chart I have reviewed his CT scan  I have recommended that we taken to the operating room this afternoon and attempt diagnostic laparoscopy with biopsy.  Hopefully we can get in, possibly subcostal approach.  I told him that it was possible we might have to convert to an open laparotomy.  He would like to go ahead with this to see if we can get a diagnosis I discussed the indications, details, techniques, and numerous risk of laparoscopy and laparotomy with him.  He is aware the risk of bleeding, infection, injury to adjacent organs such as intestine or major vascular structures with major reconstructive surgery, wound healing problems, hernias, cardiac pulmonary and thromboembolic prompts.  Ascitic leak is also possible.  He understands all of this.  All his questions are answered.  He agrees with this plan.  OR has been notified.   Donald [name]. Dalbert Batman, M.D., [facility] General and Minimally invasive Surgery Breast and Colorectal Surgery Office:   [phone]

## 2015-07-20 NOTE — Consult Note (Signed)
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 Admitted 07/14/2015 with back pain history and chest pain history. Evaluation showed omental cake along with right pleural effusion.  He has had biopsies office abdominal mass to CT guided technology and thoracentesis but so far evaluation is been nondiagnostic. There is high suspicion for high-grade B-cell lymphoma. In addition this concern for tumor lysis syndrome based on rising creatinine and he was started on hydration with allopurinol [uric acid has been fairly normal]. Today 07/20/2015 he underwent laparoscopic biopsy of his omental cake. Postoperatively there were desaturations and there was delayed extubation as a result. He has returned to the intensive care unit with a facemask oxygen. He reports that he is feeling okay. When he removed a facemask oxygen for 5 minutes is lowest pulse ox was 90%. Chest x-ray done several hours earlier in the immediate post exudation. Showed a change with bilateral pulmonary congestion and oncologist Dr. Irene Limbo suspects that he is at risk for acute lung injury/ARDS in the setting. Pulmonary critical care medicine is now consulting for his acute hypoxemic respiratory failure. [facility] hospitalist's primary service      PAST MEDICAL HISTORY :   has a past medical history of EBV infection (2013) and Marijuana use, episodic.  has past surgical history that includes Tonsillectomy and Appendectomy. Prior to Admission medications   Medication Sig Start Date End Date Taking? Authorizing Provider  Multiple Vitamin (MULTIVITAMIN) tablet Take 1 tablet by mouth daily.   Yes Historical Provider, MD  gabapentin (NEURONTIN) 300 MG capsule Take 1 capsule (300 mg total) by  mouth at bedtime. Patient not taking: Reported on 07/15/2015 07/13/15   Eulas Post, MD  HYDROcodone-acetaminophen (NORCO) 7.5-325 MG per tablet Take 1-2 tablets by mouth every 6 (six) hours as needed for moderate pain. 07/15/15   Charlynne Cousins, MD   Allergies  Allergen Reactions  . Fluoride Preparations Other (See Comments)    Patient prefers not to take fluoride based antibiotics like Levaquin  . Sulfa Antibiotics Other (See Comments)    Unknown, Patient was a child and does not remember the reaction    FAMILY HISTORY:  indicated that his mother is alive. He indicated that his father is deceased. He indicated that his sister is alive. He indicated that his brother is alive.  SOCIAL HISTORY:  reports that he has never smoked. He does not have any smokeless tobacco history on file. He reports that he uses illicit drugs (Marijuana) about once per week. He reports that he does not drink alcohol.  REVIEW OF SYSTEMS:  Per hpi  SUBJECTIVE:   VITAL SIGNS: Temp:  [98.5 F (36.9 C)-99.4 F (37.4 C)] 99 F (37.2 C) (09/09 1606) Pulse Rate:  [86-116] 88 (09/09 1700) Resp:  [15-21] 21 (09/09 1700) BP: (122-158)/(73-95) 154/86 mmHg (09/09 1700) SpO2:  [89 %-100 %] 95 % (09/09 1700) HEMODYNAMICS:   VENTILATOR SETTINGS:   INTAKE / OUTPUT:  Intake/Output Summary (Last 24 hours) at 07/20/15 1748 Last data filed at 07/20/15 1600  Gross per 24 hour  Intake   3875 ml  Output     50 ml  Net   3825 ml    PHYSICAL EXAMINATION: General:  Looks  ok Neuro:  Alert and oriented 3, speech normal, moves all 4 HEENT:  Face mask oxygen on, pupils equal and reactive to light Cardiovascular:  Normal heart sounds slightly tachycardic regular rate and rhythm Lungs:  Mildly tachypneic overall clear to auscultation Abdomen:  Soft Musculoskeletal:  No cyanosis no clubbing edema Skin:  Intact  LABS:  PULMONARY  Recent Labs Lab 07/20/15 1603  PHART 7.329*  PCO2ART 35.9  PO2ART 94.0   HCO3 18.4*  TCO2 16.5  O2SAT 95.8    CBC  Recent Labs Lab 07/18/15 0920 07/19/15 1019 07/20/15 0407  HGB 16.0 16.0 14.7  HCT 46.4 46.7 43.1  WBC 6.8 8.5 9.4  PLT 200 210 229    COAGULATION  Recent Labs Lab 07/15/15 0440 07/20/15 0407  INR 1.18 1.21    CARDIAC  No results for input(s): TROPONINI in the last 168 hours. No results for input(s): PROBNP in the last 168 hours.   CHEMISTRY  Recent Labs Lab 07/16/15 1136 07/17/15 0342 07/18/15 0920 07/19/15 1019 07/20/15 0407  NA 138 139 136 137 138  K 4.3 4.2 4.0 4.1 4.2  CL 104 103 95* 101 102  CO2 20* 27 31 23  21*  GLUCOSE 80 85 118* 91 76  BUN 13 8 12 16  22*  CREATININE 1.18 1.18 1.33* 1.72* 1.54*  CALCIUM 8.2* 8.1* 8.2* 8.0* 7.5*  MG  --   --   --   --  1.8  PHOS 2.7  --  2.1*  --  5.8*   Estimated Creatinine Clearance: 74.8 mL/min (by C-G formula based on Cr of 1.54).   LIVER  Recent Labs Lab 07/15/15 0440  07/16/15 1136 07/17/15 0342 07/18/15 0920 07/19/15 1019 07/20/15 0407  AST 66*  --  76* 86* 118* 110* 78*  ALT 44  --  48 54 85* 80* 61  ALKPHOS 53  --  49 44 50 48 40  BILITOT 0.9  --  0.9 0.7 0.8 0.9 0.6  PROT 5.7*  < > 5.4* 4.8* 5.3* 5.0* 4.9*  ALBUMIN 3.1*  --  3.0* 2.7* 2.8* 2.7* 2.6*  INR 1.18  --   --   --   --   --  1.21  < > = values in this interval not displayed.   INFECTIOUS No results for input(s): LATICACIDVEN, PROCALCITON in the last 168 hours.   ENDOCRINE CBG (last 3)  No results for input(s): GLUCAP in the last 72 hours.       IMAGING x48h  - image(s) personally visualized  -   highlighted in bold Dg Chest Port 1 View  07/20/2015   CLINICAL DATA:  Shortness of Breath  EXAM: PORTABLE CHEST - 1 VIEW  COMPARISON:  July 15, 2015  FINDINGS: There is alveolar edema throughout both lungs. There are small bilateral effusions. Heart is upper normal in size. There is mild pulmonary venous hypertension. No adenopathy.  IMPRESSION: Extensive edema. Question a degree  of congestive heart failure versus noncardiogenic edema. Note that cardiac silhouette is unchanged compared to recent prior study. No adenopathy apparent.   Electronically Signed   By: Lowella Grip III M.D.   On: 07/20/2015 15:36         ASSESSMENT / PLAN:  PULMONARY OETT 07/20/2015 in the operating room> 07/20/2015 A: Acute hypoxemic pulmonary respiratory failure - secondary to pulmonary infiltrates in the postoperative period   - Differential diagnoses acute lung injury versus pulmonary congestion P:   Facemask oxygen to keep pulse ox greater than 92%  Nothing by mouth except meds and liquids BiPAP as needed Lasix 10 mg 1 (patient will not accept a higher dose] Echocardiogram Check lactic acid   Discussed with [facility] hospitalist and Dr. Irene Limbo oncologist Updated patient and his wife     The patient is critically ill with multiple organ systems failure and requires high complexity decision making for assessment and support, frequent evaluation and titration of therapies, application of advanced monitoring technologies and extensive interpretation of multiple databases.   Critical Care Time devoted to patient care services described in this note is  30Minutes. This time reflects time of care of this signee Dr Brand Males. This critical care time does not reflect procedure time, or teaching time or supervisory time of PA/NP/Med student/Med Resident etc but could involve care discussion time    Dr. Brand Males, M.D., [facility].C.P Pulmonary and Critical Care Medicine Staff Physician [facility] Pager: [phone], If no answer or between  15:00h - 7:00h: call [phone]  07/20/2015 5:56 PM

## 2015-07-20 NOTE — Anesthesia Postprocedure Evaluation (Signed)
  Anesthesia Post-op Note  Patient: Donald [name]  Procedure(s) Performed: Procedure(s) (LRB): LAPAROSCOPY DIAGNOSTIC, MULTIPLE BIOPSIES, DRAINAGE OF ASCITES (N/A)  Patient Location: PACU  Anesthesia Type: General  Level of Consciousness: awake and alert   Airway and Oxygen Therapy: Patient Spontanous Breathing  Post-op Pain: mild  Post-op Assessment: Post-op Vital signs reviewed, Patient's Cardiovascular Status Stable, Respiratory Function Stable, Patent Airway and No signs of Nausea or vomiting  Last Vitals:  Filed Vitals:   07/20/15 1600  BP:   Pulse: 101  Temp:   Resp: 20    Post-op Vital Signs: stable   Complications: No apparent anesthesia complications

## 2015-07-20 NOTE — Progress Notes (Signed)
Peripherally Inserted Central Catheter/Midline Placement  The IV Nurse has discussed with the patient and/or persons authorized to consent for the patient, the purpose of this procedure and the potential benefits and risks involved with this procedure.  The benefits include less needle sticks, lab draws from the catheter and patient may be discharged home with the catheter.  Risks include, but not limited to, infection, bleeding, blood clot (thrombus formation), and puncture of an artery; nerve damage and irregular heat beat.  Alternatives to this procedure were also discussed.  PICC/Midline Placement Documentation  PICC / Midline Double Lumen 35/39/12 PICC Right Basilic 46 cm 1 cm (Active)  Indication for Insertion or Continuance of Line Poor Vasculature-patient has had multiple peripheral attempts or PIVs lasting less than 24 hours;Prolonged intravenous therapies 07/20/2015  5:00 PM  Exposed Catheter (cm) 1 cm 07/20/2015  5:00 PM  Dressing Change Due 07/27/15 07/20/2015  5:00 PM       [name] 07/20/2015, 5:26 PM

## 2015-07-20 NOTE — Progress Notes (Signed)
Donald [name]   HEMATOLOGY/ONCOLOGY INPATIENT PROGRESS NOTE  Date of Service: 07/20/2015  Inpatient Attending: .Nishant Dhungel, MD   SUBJECTIVE  Patient had his laparoscopic examination and surgical excisional biopsy of subumbilical peritoneal nodules and nodules on the falciform ligament. Noted to have a nodule on the liver. Post-operative had issues with shortness of breath and increased oxygen requirement and was transferred to the MICU for recovery and further management PCCM had been consulted.   I talked with Radiology and the oncology medical director and setup an inpatient PET/CT on Monday to complete his staging in anticipation of likely inpatient treatment.  OBJECTIVE:  PHYSICAL EXAMINATION: . Filed Vitals:   07/20/15 1530 07/20/15 1545 07/20/15 1600 07/20/15 1606  BP: 158/95 149/90    Pulse: 102 101 101 101  Temp:    99 F (37.2 C)  TempSrc:      Resp: 15 19 20 19   Height:      Weight:      SpO2: 91% 93% 93% 94%   Filed Weights   07/14/15 2306 07/19/15 1700  Weight: 206 lb 12.7 oz (93.8 kg) 208 lb (94.348 kg)   .Body mass index is 29.02 kg/(m^2).  GENERAL: SKIN:no acute rashes. EYES: normal, conjunctiva are pink and non-injected, sclera clear OROPHARYNX:no exudate, no erythema and lips, buccal mucosa, and tongue normal  NECK: supple, no JVD, thyroid normal size, non-tender, without nodularity LYMPH:  no palpable lymphadenopathy in the cervical, axillary or inguinal LUNGS: b/l rales, decreased breath sounds rt base. HEART: regular rate & rhythm,  no murmurs and trace lower extremity edema ABDOMEN: abdomen distended PSYCH: alert & oriented x 3 with fluent speech NEURO: no focal motor/sensory deficits  MEDICAL HISTORY:  Past Medical History  Diagnosis Date  . EBV infection 2013    Patient notes that he had an EBV infection in 2013 that left him with chronic fatigue. She also notes that he had significant lymphadenopathy and thyroiditis at the time. He has been  managing his symptoms with an alternative medicine practitioner in [place] and with other alternative medicines.  . Marijuana use, episodic     SURGICAL HISTORY: Past Surgical History  Procedure Laterality Date  . Tonsillectomy    . Appendectomy      SOCIAL HISTORY: Social History   Social History  . Marital Status: Married    Spouse Name: N/A  . Number of Children: N/A  . Years of Education: N/A   Occupational History  . Not on file.   Social History Main Topics  . Smoking status: Never Smoker   . Smokeless tobacco: Not on file  . Alcohol Use: No  . Drug Use: 1.00 per week    Special: Marijuana  . Sexual Activity: Not on file   Other Topics Concern  . 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   He has a fiance and has been in a steady relationship.      He has tried to maintain a very healthy lifestyle and cycles about 100 miles a week. He also uses a fair number of over-the-counter alternative medicines to stay healthy.    FAMILY HISTORY: Family History  Problem Relation Age of Onset  . Heart failure Father     ALLERGIES:  is allergic to fluoride preparations and sulfa antibiotics.  MEDICATIONS:  Scheduled Meds: . allopurinol  150 mg Oral BID  . [START ON 07/21/2015] enoxaparin (LOVENOX) injection  40 mg Subcutaneous Q24H   Continuous Infusions: . sodium chloride 10 mL/hr at 07/15/15 1023  . sodium chloride 125 mL/hr at 07/20/15 1610   PRN Meds:.gi cocktail, HYDROcodone-acetaminophen, LORazepam, methocarbamol, morphine injection, ondansetron (ZOFRAN) IV  REVIEW OF SYSTEMS:    10 Point review of Systems was done is negative except as noted above.   LABORATORY DATA:  I have reviewed the data as listed  . CBC Latest Ref Rng 07/20/2015 07/19/2015 07/18/2015  WBC 4.0 - 10.5 K/uL 9.4 8.5 6.8  Hemoglobin 13.0 - 17.0 g/dL 14.7 16.0 16.0  Hematocrit 39.0 -  52.0 % 43.1 46.7 46.4  Platelets 150 - 400 K/uL 229 210 200    . CMP Latest Ref Rng 07/20/2015 07/19/2015 07/18/2015  Glucose 65 - 99 mg/dL 76 91 118(H)  BUN 6 - 20 mg/dL 22(H) 16 12  Creatinine 0.61 - 1.24 mg/dL 1.54(H) 1.72(H) 1.33(H)  Sodium 135 - 145 mmol/L 138 137 136  Potassium 3.5 - 5.1 mmol/L 4.2 4.1 4.0  Chloride 101 - 111 mmol/L 102 101 95(L)  CO2 22 - 32 mmol/L 21(L) 23 31  Calcium 8.9 - 10.3 mg/dL 7.5(L) 8.0(L) 8.2(L)  Total Protein 6.5 - 8.1 g/dL 4.9(L) 5.0(L) 5.3(L)  Total Bilirubin 0.3 - 1.2 mg/dL 0.6 0.9 0.8  Alkaline Phos 38 - 126 U/L 40 48 50  AST 15 - 41 U/L 78(H) 110(H) 118(H)  ALT 17 - 63 U/L 61 80(H) 85(H)     RADIOGRAPHIC STUDIES: I have personally reviewed the radiological images as listed and agreed with the findings in the report. Dg Chest 2 View  07/15/2015   CLINICAL DATA:  Followup right pleural effusion, status post right thoracentesis.  EXAM: CHEST  2 VIEW  COMPARISON:  Yesterday.  FINDINGS: Stable right upper companion rib shadows. No pneumothorax seen. Significantly decreased right pleural fluid. The interstitial markings remain mildly prominent. Normal sized heart. Interval mild left lower lobe linear density. Unremarkable bones.  IMPRESSION: 1. No pneumothorax following right thoracentesis. 2. Significantly less right pleural fluid. 3. Interval mild left lower lobe atelectasis. 4. Stable mild chronic interstitial lung disease.   Electronically Signed   By: Claudie Revering M.D.   On: 07/15/2015 17:21   Dg Chest 2 View  07/14/2015   CLINICAL DATA:  Right-sided chest pain for 2-3 days. Shortness of breath.  EXAM: CHEST  2 VIEW  COMPARISON:  07/12/2015  FINDINGS: Heart size is probably normal. Suspect lung opacity at the right base in addition to the elevated hemidiaphragm. Opacity at the right lung base may be related to subpulmonic effusion. Overall the appearance is stable since previous exam. There is a trace left pleural effusion. No pulmonary edema.  IMPRESSION:  Persistent significant opacity at the right lung base. Consider right lateral decubitus view to determine component of layering pleural effusion.   Electronically Signed   By: Nolon Nations M.D.   On: 07/14/2015 16:56   Dg Chest 2 View  07/12/2015   CLINICAL DATA:  Anterior chest pain for 1 week with cough  EXAM: CHEST  2 VIEW  COMPARISON:  None.  FINDINGS: There is mild elevation the right hemidiaphragm. There is evidence suggesting consolidation in the medial right base. Lungs elsewhere clear. Heart size and pulmonary vascularity are normal. No adenopathy. No bone lesions.  IMPRESSION: Evidence of a degree of consolidation in the medial right base. There is elevation of the right hemidiaphragm. Lungs elsewhere clear. Cardiac silhouette within normal limits.  Followup PA and lateral chest radiographs recommended in  3-4 weeks following trial of antibiotic therapy to ensure resolution and exclude underlying malignancy.   Electronically Signed   By: Lowella Grip III M.D.   On: 07/12/2015 15:52   Ct Angio Chest Pe W/cm &/or Wo Cm  07/14/2015   CLINICAL DATA:  Right chest pain  EXAM: CT ANGIOGRAPHY CHEST WITH CONTRAST  TECHNIQUE: Multidetector CT imaging of the chest was performed using the standard protocol during bolus administration of intravenous contrast. Multiplanar CT image reconstructions and MIPs were obtained to evaluate the vascular anatomy.  CONTRAST:  146mL OMNIPAQUE IOHEXOL 350 MG/ML SOLN  COMPARISON:  Chest x-ray 07/14/2015  FINDINGS: Negative for pulmonary embolism. Negative for aortic dissection or aneurysm  Moderately large right pleural effusion with compressive atelectasis of the right lower lobe. Atelectasis also present in the right middle lobe. Partial collapse of the right upper lobe medially adjacent to the mediastinum. This extends into the right upper lobe laterally. Small left pleural effusion. No significant infiltrate on the left.  No adenopathy is present in the mediastinum.   No acute bony abnormality  In the upper abdomen, there is mild to moderate ascites. This could be due to liver disease. There is fluid around the liver and spleen. The spleen is not enlarged. Limited imaging of the pancreas and kidneys are grossly normal. There is soft tissue infiltration in the omentum. Carcinoma not excluded.  Review of the MIP images confirms the above findings.  IMPRESSION: Negative for pulmonary embolism  Moderate right pleural effusion with compressive atelectasis in the right lower lobe. Additional atelectasis in the right middle lobe and right upper lobe. No definite mass or pneumonia however infection and tumor are in the differential. There is a small left effusion  Concerning findings in the abdomen. There is ascites around the liver and spleen. There is soft tissue density involving the omentum which can be seen with carcinoma. Further imaging of the abdomen with CT abdomen pelvis with contrast is suggested.   Electronically Signed   By: Franchot Gallo M.D.   On: 07/14/2015 18:28   Ct Abdomen Pelvis W Contrast  07/15/2015   CLINICAL DATA:  39 year old male with ascites and potential omental disease noted on prior chest CT. Follow-up abdominal CT scan.  EXAM: CT ABDOMEN AND PELVIS WITH CONTRAST  TECHNIQUE: Multidetector CT imaging of the abdomen and pelvis was performed using the standard protocol following bolus administration of intravenous contrast.  CONTRAST:  165mL OMNIPAQUE IOHEXOL 300 MG/ML  SOLN  COMPARISON:  CT of the chest 07/14/2015. CT of the pelvis 03/03/2008.  FINDINGS: Lower chest: Small bilateral pleural effusions (left greater than right). Subsegmental atelectasis in the posterior aspect of the right lower lobe.  Hepatobiliary: No cystic or solid hepatic lesion identified. No intra or extrahepatic biliary ductal dilatation. High attenuation material filling the gallbladder, presumably vicarious excretion of contrast material from the recent chest CT.  Pancreas: No  pancreatic mass. No pancreatic ductal dilatation. No pancreatic or peripancreatic fluid or inflammatory changes.  Spleen: Unremarkable.  Adrenals/Urinary Tract: Bilateral adrenal glands and bilateral kidneys are unremarkable in appearance. No hydroureteronephrosis. Urinary bladder is unremarkable in appearance.  Stomach/Bowel: In the mid small bowel (like mid to distal jejunum) there is a profoundly asymmetrically thickened loop of bowel best demonstrated on image 56 of series 2, where the wall measures up to 22 mm in thickness medially. Adjacent to this there are soft tissue masses extending into the root of the small bowel mesentery, largest of which measures 2.7 x 3.3 cm (image  47 of series 2), likely malignant lymph nodes. The appendix is not confidently identified. No pathologic dilatation of small bowel or colon. Stomach is grossly normal in appearance.  Vascular/Lymphatic: No significant atherosclerotic disease, aneurysm or dissection identified in the abdominal or pelvic vasculature. Circumaortic left renal vein (normal anatomical variant) incidentally noted. Probable mesenteric lymphadenopathy, as discussed above.  Reproductive: Prostate gland and seminal vesicles are unremarkable in appearance. Inguinal canals and visualized portions of the scrotum were grossly unremarkable in appearance. No lymphadenopathy noted along the gonadal drainage system.  Other: Large amount of abnormal enhancing soft tissue throughout the peritoneal cavity, most notable in the omentum, particularly in the right upper quadrant where there is bulky omental caking. Small volume of presumably malignant ascites. No pneumoperitoneum. Some abnormal soft tissue is extending through the anterior abdominal wall into the periumbilical region (image 53 of series 2).  Musculoskeletal: There are no aggressive appearing lytic or blastic lesions noted in the visualized portions of the skeleton.  IMPRESSION: 1. Widespread intraperitoneal  malignancy, as demonstrated by extensive peritoneal nodularity and enhancement, widespread omental caking, likely malignant ascites and extensive mesenteric lymphadenopathy. The exact source of malignancy is uncertain, however, given the focally thickened loop of small bowel (likely jejunal) in the left side of the central abdomen (image 56 of series 2), a primary small bowel malignancy is suspected. 2. Small bilateral pleural effusions (left greater than right). Right-sided pleural effusion significantly decreased following thoracentesis. Resolving subsegmental atelectasis in the right lower lobe. 3. Additional incidental findings, as above. These results were called by telephone at the time of interpretation on 07/15/2015 at 5:33 pm to Dr. Olevia Bowens, who verbally acknowledged these results.   Electronically Signed   By: Vinnie Langton M.D.   On: 07/15/2015 17:34   US Biopsy  07/17/2015   CLINICAL DATA:  Extensive peritoneal nodularity and omental caking of uncertain etiology. Periumbilical masses.  EXAM: ULTRASOUND-GUIDED CORE PERIUMBILICAL BIOPSY  TECHNIQUE: An ultrasound guided biopsy was thoroughly discussed with the patient and questions were answered. The benefits, risks, alternatives, and complications were also discussed. The patient understands and wishes to proceed with the procedure. A verbal as well as written consent was obtained.  Survey ultrasound of the periumbilical region was performed and an appropriate skin entry site was determined. Skin site was marked, prepped with chlorhexidine, and draped in usual sterile fashion, and infiltrated locally with 1% lidocaine.  Intravenous Fentanyl and Versed were administered as conscious sedation during continuous cardiorespiratory monitoring by the radiology RN, with a total moderate sedation time of less than 30 minutes.  Under real-time ultrasound guidance, a 17 gauge trocar needle was advanced to the margin of the lesion. Once needle tip position was  confirmed, coaxial 18-gauge core biopsy samples were obtained, submitted in saline to surgical pathology. The guide needle was removed. Postprocedure scans show no hemorrhage or other apparent complication.  COMPLICATIONS: COMPLICATIONS None immediate  FINDINGS: Nodular periumbilical subcutaneous masses were localized. Core biopsy samples were obtained without complication.  IMPRESSION: 1. Technically successful ultrasound guided core periumbilical nodule biopsy.   Electronically Signed   By: Lucrezia Europe M.D.   On: 07/17/2015 16:15   US Paracentesis  07/17/2015   CLINICAL DATA:  Omental disease and peritoneal nodules.  EXAM: ULTRASOUND GUIDED PARACENTESIS  TECHNIQUE: The procedure, risks (including but not limited to bleeding, infection, organ damage ), benefits, and alternatives were explained to the patient. Questions regarding the procedure were encouraged and answered. The patient understands and consents to the procedure. Survey ultrasound of  the abdomen was performed and an appropriate skin entry site in the right lower abdomen was selected. Skin site was marked, prepped with Betadine, and draped in usual sterile fashion, and infiltrated locally with 1% lidocaine. A 5 French multisidehole Yueh sheath needle was advanced into the peritoneal space until fluid could be aspirated. The sheath was advanced and the needle removed. 1.2 L of cloudy yellow ascites were aspirated. A sample sent to cytology for the requested studies.  COMPLICATIONS: COMPLICATIONS none  IMPRESSION: Technically successful ultrasound guided paracentesis, removing 1.2 L of ascites. Sample to cytology.   Electronically Signed   By: Lucrezia Europe M.D.   On: 07/17/2015 16:17   Dg Chest Port 1 View  07/20/2015   CLINICAL DATA:  Shortness of Breath  EXAM: PORTABLE CHEST - 1 VIEW  COMPARISON:  July 15, 2015  FINDINGS: There is alveolar edema throughout both lungs. There are small bilateral effusions. Heart is upper normal in size. There is mild  pulmonary venous hypertension. No adenopathy.  IMPRESSION: Extensive edema. Question a degree of congestive heart failure versus noncardiogenic edema. Note that cardiac silhouette is unchanged compared to recent prior study. No adenopathy apparent.   Electronically Signed   By: Lowella Grip III M.D.   On: 07/20/2015 15:36    ASSESSMENT & PLAN:   Patient is a 39 yo male admitted with   1) Likely high grade B-cell lymphoma (based on limited data from previous flow and biopsy- discussed with Dr Monica Martinez) with spontaneous TLS. ? EBV related given h/o bothersome infection in 2013) Biopsy was not sufficient to get to a definitive diagnosis and so surgery was consulted and patient had a laparoscopic excision biopsy of peritoneal nodules and nodules on a part of the falciform ligament today (07/20/2015) 2) Post-operative hypoxic respiratory failure with CXR showing b/l infiltrates ?Fluid overload vs ARDS from tumor lysis syndrome triggered by anesthesia/tissue handing. 2) Spontaneous TLS - uric acid normalized with allopurinol 3) AKI ?related to contrast/urate nephropathy less likely urinary obstruction  4) elevated transaminases - ? Related to allopurinol. Improved today. Uric acid normal normal 5) Elevated free T4 - ? Related to his supplements. Plan - appreciate surgical input for laparoscopic biopsy -ARDS vs fluid overload - being evaluated by PCCM, will need CVC to check CVP and management fluid status according (IVF vs diuretics) -stat TLS labs - rpt CBC, cmp, uric acid, LDH, phosphorus, ionized calcium -will recheck thyroid function tests since those were abnormal as well. -restarted allopurinol at lower dose/split for continued TLS prophylaxis @ 150mg  po BID -will need to consider using Rasburicase if significant TLS -ECHO as soon as possible - will be needed to assess cardiac status and well as for chemotherapy. -inpatient PET/CT scan arranged for Monday 07/23/2015 (needs to be NPO then) - if  no evidence of TLS will consider starting on steroids pending final tissue diagnosis. -I had a detailed discussion with the patient and his partner about consideration of sperm banking prior to any chemotherapy and discussed the [facility] [place] program. Both on them unanimously decided that since they already have 4 boys they are not interested sperm banking. -will continue to follow daily.  Appreciate cares by surgery, PCCM and the hospitalist team. Kindly call if questions.   Sullivan Lone MD [place] AAHIVMS [facility] Hematology/Oncology Physician [facility]  (Office):       [phone] (Work cell):  [phone] (Fax):           [phone]  07/20/2015 5:02 PM

## 2015-07-20 NOTE — Anesthesia Preprocedure Evaluation (Addendum)
Anesthesia Evaluation  Patient identified by MRN, date of birth, ID band Patient awake    Reviewed: Allergy & Precautions, H&P , NPO status , Patient's Chart, lab work & pertinent test results  Airway Mallampati: II  TM Distance: >3 FB Neck ROM: full    Dental no notable dental hx. (+) Dental Advisory Given, Teeth Intact   Pulmonary  Pleural effusions   Pulmonary exam normal breath sounds clear to auscultation       Cardiovascular Exercise Tolerance: Good negative cardio ROS Normal cardiovascular exam Rhythm:regular Rate:Normal     Neuro/Psych negative neurological ROS  negative psych ROS   GI/Hepatic Neg liver ROS, Elevated LFTs Ascites. Abdominal carcinoma   Endo/Other  negative endocrine ROS  Renal/GU ARFRenal disease  negative genitourinary   Musculoskeletal   Abdominal   Peds  Hematology negative hematology ROS (+) lymphoma   Anesthesia Other Findings Tumor lysis syndrome  Reproductive/Obstetrics negative OB ROS                             Anesthesia Physical Anesthesia Plan  ASA: III  Anesthesia Plan: General   Post-op Pain Management:    Induction: Intravenous  Airway Management Planned: Oral ETT  Additional Equipment:   Intra-op Plan:   Post-operative Plan: Possible Post-op intubation/ventilation  Informed Consent: I have reviewed the patients History and Physical, chart, labs and discussed the procedure including the risks, benefits and alternatives for the proposed anesthesia with the patient or authorized representative who has indicated his/her understanding and acceptance.   Dental Advisory Given  Plan Discussed with: CRNA and Surgeon  Anesthesia Plan Comments:        Anesthesia Quick Evaluation

## 2015-07-20 NOTE — Progress Notes (Signed)
[facility] PROGRESS NOTE  Donald [name] CBU:[id number] DOB: 1976-07-26 DOA: 07/14/2015 PCP: Drema Pry, DO  brief narrative 39 year old male with no medical history presented with 2 weeks of back pain while riding his bike. He had a chest x-ray done as outpatient which suggested a possible infection and was given antibiotic. However patient returned to the ED as he had right chest pain and was short of breath. In the ED was found to have a large right-sided pleural effusion and was admitted for further workup.  Assessment/Plan: Pleural effusion, right/abdominal mass suggestive of B cell lymphoma: -CT scan of the chest with contrast: -  significant effusion with compression of the right lung. -CT scan of the abdomen :- peritoneal carcinomatosis with no lymphadenopathy -right  Thoracocentesis suggests exudate. -oncology following. -[place] core biopsy of abd mass and diagnostic/therapeutic paracentesis. tissue flow cytometry suggests proliferative B cell.s -Oncology concern about tumor lysis syndrome, started on IV hydration and allopurinol. -pt transferred to [facility] and scheduled for laparoscopic excisional bx.  -oncology recommends inpt PET scan so that treatment can be initiated right away. Plan on high dose steroids empirically once excision bx done.   Acute kidney injury -possibly due to tumor lysis syndrome and IV contrast -continue IV fluids -needs PICC line once renal fn is better.  Tumor lysis syndrome: -started on allopurinol but given worsened LFTs it was held. -renal fn and uric acid normal -continue aggressive IVF's. Check renal US  Hypoxic respiratory failure Postsurgery patient desaturating and extubation has been delayed. PC CM consulted who will evaluate the patient.  Screening process: -non reactive HIV -CMV IGM and antibody neg -EBV results (DNA by PCR and VCA antibody) pending  -ESR 6 -CRP 4.3 -hepatitis panel neg -AFP 4.5 -HCG tumor marker < 1      DVT  prophylaxis: sq heparin  Code Status: Full   Consultants:   IR Oncology  CCS  Procedures:  Thoracocentesis  Paracentesis  laproscopy abdominal biopsy  Antibiotics:  None  HPI/Subjective: Reports some abdominal discomfort this am.   Objective: Filed Vitals:   07/20/15 1040  BP: 152/84  Pulse: 95  Temp: 98.5 F (36.9 C)  Resp: 20    Intake/Output Summary (Last 24 hours) at 07/20/15 1436 Last data filed at 07/20/15 0635  Gross per 24 hour  Intake   1875 ml  Output      0 ml  Net   1875 ml   Filed Weights   07/14/15 2306 07/19/15 1700  Weight: 93.8 kg (206 lb 12.7 oz) 94.348 kg (208 lb)    Exam:   General:  Middle aged male in no acute distress  HEENT: Moist oral mucosa, supple neck  Chest: Diminished breath sounds over right lung  CVS: Normal S1 and S2, no  GI: Soft, abdominal distention with some tenderness, bowel sounds present  Musculoskeletal: Warm, no edema  CNS: Alert and oriented  Data Reviewed: Basic Metabolic Panel:  Recent Labs Lab 07/16/15 1136 07/17/15 0342 07/18/15 0920 07/19/15 1019 07/20/15 0407  NA 138 139 136 137 138  K 4.3 4.2 4.0 4.1 4.2  CL 104 103 95* 101 102  CO2 20* 27 31 23  21*  GLUCOSE 80 85 118* 91 76  BUN 13 8 12 16  22*  CREATININE 1.18 1.18 1.33* 1.72* 1.54*  CALCIUM 8.2* 8.1* 8.2* 8.0* 7.5*  MG  --   --   --   --  1.8  PHOS 2.7  --  2.1*  --  5.8*   Liver Function  Tests:  Recent Labs Lab 07/16/15 1136 07/17/15 0342 07/18/15 0920 07/19/15 1019 07/20/15 0407  AST 76* 86* 118* 110* 78*  ALT 48 54 85* 80* 61  ALKPHOS 49 44 50 48 40  BILITOT 0.9 0.7 0.8 0.9 0.6  PROT 5.4* 4.8* 5.3* 5.0* 4.9*  ALBUMIN 3.0* 2.7* 2.8* 2.7* 2.6*   No results for input(s): LIPASE, AMYLASE in the last 168 hours. No results for input(s): AMMONIA in the last 168 hours. CBC:  Recent Labs Lab 07/15/15 0440 07/17/15 0342 07/18/15 0920 07/19/15 1019 07/20/15 0407  WBC 7.6 7.1 6.8 8.5 9.4  NEUTROABS 4.3 4.5 4.2  5.9 5.8  HGB 15.0 14.4 16.0 16.0 14.7  HCT 44.7 41.6 46.4 46.7 43.1  MCV 86.5 85.1 86.1 85.7 86.4  PLT 222 199 200 210 229   Cardiac Enzymes: No results for input(s): CKTOTAL, CKMB, CKMBINDEX, TROPONINI in the last 168 hours. BNP (last 3 results) No results for input(s): BNP in the last 8760 hours.  ProBNP (last 3 results) No results for input(s): PROBNP in the last 8760 hours.  CBG: No results for input(s): GLUCAP in the last 168 hours.  Recent Results (from the past 240 hour(s))  AFB culture with smear     Status: None (Preliminary result)   Collection Time: 07/15/15  3:23 PM  Result Value Ref Range Status   Specimen Description PLEURAL RIGHT FLUID  Final   Special Requests PLEURAL RIGHT FLUID  Final   Acid Fast Smear   Final    NO ACID FAST BACILLI SEEN Performed at [facility]    Culture   Final    CULTURE WILL BE EXAMINED FOR 6 WEEKS BEFORE ISSUING A FINAL REPORT Performed at [facility]    Report Status PENDING  Incomplete  Fungus Culture with Smear     Status: None (Preliminary result)   Collection Time: 07/15/15  3:23 PM  Result Value Ref Range Status   Specimen Description PLEURAL RIGHT FLUID  Final   Special Requests PLEURAL RIGHT FLUID  Final   Fungal Smear   Final    NO YEAST OR FUNGAL ELEMENTS SEEN Performed at [facility]    Culture   Final    CULTURE IN PROGRESS FOR FOUR WEEKS Performed at [facility]    Report Status PENDING  Incomplete  Culture, body fluid-bottle     Status: None   Collection Time: 07/15/15  3:23 PM  Result Value Ref Range Status   Specimen Description FLUID PLEURAL  Final   Special Requests BOTTLES DRAWN AEROBIC AND ANAEROBIC 5CC  Final   Culture NO GROWTH 5 DAYS  Final   Report Status 07/20/2015 FINAL  Final  Gram stain     Status: None   Collection Time: 07/15/15  3:23 PM  Result Value Ref Range Status   Specimen Description FLUID PLEURAL  Final   Special Requests NONE  Final   Gram  Stain   Final    ABUNDANT WBC PRESENT,BOTH PMN AND MONONUCLEAR NO ORGANISMS SEEN GRAM STAIN REVIEWED-AGREE WITH RESULT K WOOLEN    Report Status 07/15/2015 FINAL  Final  Surgical pcr screen     Status: None   Collection Time: 07/19/15 10:12 PM  Result Value Ref Range Status   MRSA, PCR NEGATIVE NEGATIVE Final   Staphylococcus aureus NEGATIVE NEGATIVE Final    Comment:        The Xpert SA Assay (FDA approved for NASAL specimens in patients over 71 years of age), is  one component of a comprehensive surveillance program.  Test performance has been validated by [facility] for patients greater than or equal to 37 year old. It is not intended to diagnose infection nor to guide or monitor treatment.      Studies: No results found.  Scheduled Meds:  Continuous Infusions: . sodium chloride 10 mL/hr at 07/15/15 1023     Time spent: 25 minutes    Louellen Molder  [facility] Pager [phone] If 7PM-7AM, please contact night-coverage at [email or web address], password [facility] 07/20/2015, 2:36 PM  LOS: 6 days

## 2015-07-20 NOTE — Transfer of Care (Signed)
Immediate Anesthesia Transfer of Care Note  Patient: Donald [name]  Procedure(s) Performed: Procedure(s): LAPAROSCOPY DIAGNOSTIC, MULTIPLE BIOPSIES, DRAINAGE OF ASCITES (N/A)  Patient Location: PACU  Anesthesia Type:General  Level of Consciousness: awake and alert   Airway & Oxygen Therapy: Patient Spontanous Breathing and non-rebreather face mask  Post-op Assessment: Report given to RN and Post -op Vital signs reviewed and unstable, Anesthesiologist notified  Post vital signs: Reviewed and stable  Last Vitals:  Filed Vitals:   07/20/15 1520  BP: 154/87  Pulse: 116  Temp:   Resp: 17    Complications: No apparent anesthesia complications

## 2015-07-20 NOTE — Op Note (Addendum)
Patient Name:           Donald [name]   Date of Surgery:        07/20/2015  Pre op Diagnosis:       Intra-abdominal lymphadenopathy, omental infiltration and ascites.                                        Bilateral pleural effusions                                        Intra-abdominal neoplastic process such as lymphoma or carcinoma suspected  Post op Diagnosis:     same  Procedure:                  Diagnostic laparoscopy                                       Excision 1.5 cm umbilical nodule frozen section                                       Excision of a second umbilical nodule                                       Excision of falciform ligament with nodules                                       Aspiration of ascites for cytology  Surgeon:                     Edsel Petrin. Dalbert Batman, M.D., FACS  Assistant:                     OR staff  Operative Indications:    This is a 39 year old man previously healthy who began having symptoms about 2 weeks ago with some ill-defined back pain shortness of breath was found to have a pleural effusion and then a CT scan showed what looked like widespread abdominal carcinomatosis.  He was admitted to [facility] on 07/14/2015 then transferred [facility] on September 7. No prior history of any gastrointestinal disease or abdominal surgery. He's undergone pleural thoracentesis as well as CT-guided biopsy of abdominal tissue , both of which were nondiagnostic.-   Medical oncology is concerned that he has tumor lysis syndrome and reck request urgent abdominal biopsy.  Operative Findings:        The patient had several nodules on the peritoneum in the periumbilical area, 2 of these were subtotally debulked.  There is also a nodule in the  Orders advance as tolerated and ambulate may shower tomorrow  Prophylaxis that tomorrow at the 1. falciform ligament and we excise that to try to give the pathologist enough tissue to do all of their special stains.  The omentum  was full of rubbery tumor masses.  There were a couple of nodules on the surface of the liver, one on the  right lobe and one on the left lobe.  There was greenish yellow ascites.  We aspirated about 250 mL of this and send it for cytology and cellblock.  The rest was evacuated.  There was a solitary loop of small bowel which appeared very thickened and was consistent with lymphoma by gross exam.  This was not removed.        Image 1 shows liver nodule on surface of right lobe of liver    image 3 shows nodularity of the periumbilical area   Image 2 shows nodularity of the omentum      Image 4 shows thickened loop of small bowel      Procedure in Detail:           Findings induction of general endotracheal anesthesia the patient's abdomen was prepped and draped in a sterile fashion.  Intravenous antibiotics were given.  Surgical timeout was performed. 0.5% Marcaine with epinephrine was used as a local infiltration at aesthetic.  5 mm optical trocar was placed in the left subcostal region in the midclavicular line.  Optical entry was uneventful.  Pneumoperitoneum was created and video camera was inserted. A 12 mm trocar  Was placed in the left flank and a 5 mm trocar placed in the left lower quadrant just below the umbilicus.       I aspirated a large-volume of the ascites and sent that for cytology.      After surveying the liver, peritoneal surfaces, omentum, colon, small bowel and pelvis we used a cautery scissors to excise a large firm nodule at the umbilicus. Frozen section was requested and this clearly shows neoplastic tissue,  But couldn't tell whether this was lymphoma or carcinoma.The pathologist stated that they have froze  the entire nodule and requested further tissue.  I then excised another large nodule from the umbilicus using the harmonic scalpel.  I then found nodules in the  falciform ligament and resected a segment of the  Falciform ligament with the nodules.  These were removed and  sent as separate specimens there was sent fresh to the lab for routine histology.  All of the dissection areas were irrigated and hemostasis was excellent. The pneumoperitoneum was released. The trochars were removed.  The incisions were closed with subcuticular sutures of 4-0 Monocryl and Dermabond.      During induction the patient became hypoxic that straightened out as soon as he was intubated and oxygenated.  At the end of the case they were having trouble oxygenating him and plans are being made to get a blood gas and a chest x-ray and place him in the ICU. Estimated blood loss 25 mL. Counts correct Surgical  complications none.  possible intraoperative and postoperative hypoxia to be evaluated.     Edsel Petrin. Dalbert Batman, M.D., FACS General and Minimally Invasive Surgery Breast and Colorectal Surgery  07/20/2015 2:46 PM

## 2015-07-21 ENCOUNTER — Inpatient Hospital Stay (HOSPITAL_COMMUNITY): Payer: BLUE CROSS/BLUE SHIELD

## 2015-07-21 DIAGNOSIS — J810 Acute pulmonary edema: Secondary | ICD-10-CM

## 2015-07-21 DIAGNOSIS — J9601 Acute respiratory failure with hypoxia: Secondary | ICD-10-CM | POA: Insufficient documentation

## 2015-07-21 LAB — COMPREHENSIVE METABOLIC PANEL
ALBUMIN: 2.9 g/dL — AB (ref 3.5–5.0)
ALK PHOS: 35 U/L — AB (ref 38–126)
ALK PHOS: 40 U/L (ref 38–126)
ALT: 53 U/L (ref 17–63)
ALT: 61 U/L (ref 17–63)
AST: 63 U/L — ABNORMAL HIGH (ref 15–41)
AST: 76 U/L — ABNORMAL HIGH (ref 15–41)
Albumin: 2.7 g/dL — ABNORMAL LOW (ref 3.5–5.0)
Anion gap: 12 (ref 5–15)
Anion gap: 9 (ref 5–15)
BILIRUBIN TOTAL: 0.6 mg/dL (ref 0.3–1.2)
BUN: 24 mg/dL — ABNORMAL HIGH (ref 6–20)
BUN: 20 mg/dL (ref 6–20)
CALCIUM: 7.5 mg/dL — AB (ref 8.9–10.3)
CALCIUM: 7.9 mg/dL — AB (ref 8.9–10.3)
CO2: 25 mmol/L (ref 22–32)
CO2: 31 mmol/L (ref 22–32)
CREATININE: 1.18 mg/dL (ref 0.61–1.24)
CREATININE: 0.97 mg/dL (ref 0.61–1.24)
Chloride: 99 mmol/L — ABNORMAL LOW (ref 101–111)
Chloride: 98 mmol/L — ABNORMAL LOW (ref 101–111)
GFR calc non Af Amer: >60 mL/min (ref >60)
GLUCOSE: 146 mg/dL — AB (ref 65–99)
Glucose, Bld: 141 mg/dL — ABNORMAL HIGH (ref 65–99)
Potassium: 4.2 mmol/L (ref 3.5–5.1)
Potassium: 3.7 mmol/L (ref 3.5–5.1)
SODIUM: 138 mmol/L (ref 135–145)
Sodium: 136 mmol/L (ref 135–145)
Total Bilirubin: 0.6 mg/dL (ref 0.3–1.2)
Total Protein: 5.1 g/dL — ABNORMAL LOW (ref 6.5–8.1)
Total Protein: 5.5 g/dL — ABNORMAL LOW (ref 6.5–8.1)

## 2015-07-21 LAB — PHOSPHORUS: Phosphorus: 3.7 mg/dL (ref 2.5–4.6)

## 2015-07-21 LAB — TROPONIN I: Troponin I: <0.03 ng/mL (ref <0.031)

## 2015-07-21 LAB — LACTIC ACID, PLASMA: Lactic Acid, Venous: 1.7 mmol/L (ref 0.5–2.0)

## 2015-07-21 LAB — LACTATE DEHYDROGENASE: LDH: 1026 U/L — ABNORMAL HIGH (ref 98–192)

## 2015-07-21 LAB — URIC ACID: Uric Acid, Serum: 7.9 mg/dL — ABNORMAL HIGH (ref 4.4–7.6)

## 2015-07-21 LAB — C-REACTIVE PROTEIN: CRP: 8.3 mg/dL — ABNORMAL HIGH (ref <1.0)

## 2015-07-21 LAB — SEDIMENTATION RATE: Sed Rate: 13 mm/hr (ref 0–16)

## 2015-07-21 MED ORDER — FUROSEMIDE 10 MG/ML IJ SOLN
20.0000 mg | Freq: Once | INTRAMUSCULAR | Status: AC
  Administered 2015-07-21: 20 mg via INTRAVENOUS
  Filled 2015-07-21: qty 2

## 2015-07-21 MED ORDER — DEXAMETHASONE SODIUM PHOSPHATE 4 MG/ML IJ SOLN
4.0000 mg | Freq: Two times a day (BID) | INTRAMUSCULAR | Status: DC
  Administered 2015-07-21 – 2015-07-22 (×3): 4 mg via INTRAVENOUS
  Filled 2015-07-21 (×3): qty 1

## 2015-07-21 MED ORDER — PANTOPRAZOLE SODIUM 40 MG PO TBEC
40.0000 mg | DELAYED_RELEASE_TABLET | Freq: Two times a day (BID) | ORAL | Status: DC
  Administered 2015-07-21 – 2015-07-30 (×17): 40 mg via ORAL
  Filled 2015-07-21 (×18): qty 1

## 2015-07-21 MED ORDER — PANTOPRAZOLE SODIUM 40 MG PO TBEC: 40.0000 mg | DELAYED_RELEASE_TABLET | Freq: Every day | ORAL | Status: DC

## 2015-07-21 MED ORDER — FAMOTIDINE 20 MG PO TABS: 20.0000 mg | ORAL_TABLET | Freq: Two times a day (BID) | ORAL | Status: DC

## 2015-07-21 MED ORDER — SENNOSIDES-DOCUSATE SODIUM 8.6-50 MG PO TABS
2.0000 | ORAL_TABLET | Freq: Every day | ORAL | Status: DC
  Administered 2015-07-21 – 2015-07-28 (×6): 2 via ORAL
  Filled 2015-07-21 (×8): qty 2

## 2015-07-21 NOTE — Progress Notes (Signed)
1 Day Post-Op diagnostic laparoscopy with biopsy Subjective: Appears to be doing well after surgery.  Tolerating a diet.  Objective: Vital signs in last 24 hours: Temp:  [98 F (36.7 C)-99.4 F (37.4 C)] 98 F (36.7 C) (09/10 0800) Pulse Rate:  [72-116] 95 (09/10 0800) Resp:  [14-24] 18 (09/10 0800) BP: (131-158)/(62-95) 142/82 mmHg (09/10 0700) SpO2:  [89 %-100 %] 90 % (09/10 0800) FiO2 (%):  [55 %] 55 % (09/09 2200)   Intake/Output from previous day: 09/09 0701 - 09/10 0700 In: 2020 [I.V.:1970; IV Piggyback:50] Out: 1000 [Urine:950; Blood:50] Intake/Output this shift:     General appearance: alert and cooperative GI: soft, mildly distended  Incision: no significant drainage  Lab Results:   Recent Labs  07/20/15 0407 07/20/15 1646  WBC 9.4 9.8  HGB 14.7 14.1  HCT 43.1 41.9  PLT 229 231   BMET  Recent Labs  07/20/15 0407 07/20/15 1646  NA 138 141  K 4.2 4.8  CL 102 104  CO2 21* 20*  GLUCOSE 76 111*  BUN 22* 24*  CREATININE 1.54* 1.39*  CALCIUM 7.5* 7.7*   PT/INR  Recent Labs  07/20/15 0407  LABPROT 15.4*  INR 1.21   ABG  Recent Labs  07/20/15 1603  PHART 7.329*  HCO3 18.4*    MEDS, Scheduled . allopurinol  150 mg Oral BID  . dexamethasone  4 mg Intravenous BID AC  . enoxaparin (LOVENOX) injection  40 mg Subcutaneous Q24H  . pantoprazole  40 mg Oral BID AC  . sodium chloride  10-40 mL Intracatheter Q12H    Studies/Results: Dg Chest Port 1 View  07/20/2015   CLINICAL DATA:  Shortness of Breath  EXAM: PORTABLE CHEST - 1 VIEW  COMPARISON:  July 15, 2015  FINDINGS: There is alveolar edema throughout both lungs. There are small bilateral effusions. Heart is upper normal in size. There is mild pulmonary venous hypertension. No adenopathy.  IMPRESSION: Extensive edema. Question a degree of congestive heart failure versus noncardiogenic edema. Note that cardiac silhouette is unchanged compared to recent prior study. No adenopathy apparent.    Electronically Signed   By: Lowella Grip III M.D.   On: 07/20/2015 15:36    Assessment: s/p Procedure(s): LAPAROSCOPY DIAGNOSTIC, MULTIPLE BIOPSIES, DRAINAGE OF ASCITES Patient Active Problem List   Diagnosis Date Noted  . Acute respiratory failure with hypoxia 07/20/2015  . ARF (acute renal failure)   . Elevated LFTs   . Lymphoma   . Abdominal distension   . AKI (acute kidney injury)   . Ascites   . Mass   . Spontaneous tumor lysis syndrome   . Pleural effusion 07/15/2015  . Pleural effusion, right 07/14/2015  . Chronic fatigue syndrome 09/23/2012    Appears to be doing well post-operatively.   Plan: Advance diet as tolerated Ok to shower Will see as needed going forward.   Please call if any questions or concerns   LOS: 7 days     .Rosario Adie, [facility], [place]   07/21/2015 9:11 AM

## 2015-07-21 NOTE — Progress Notes (Signed)
[name]   HEMATOLOGY/ONCOLOGY INPATIENT PROGRESS NOTE  Date of Service: 07/21/2015  Inpatient Attending: .Louellen Molder, MD   SUBJECTIVE  Patient seen this afternoon. Mother and other family at bedside. Breathing improved after diuresis. Down to 2L/min Donald [name]. CXR appears improved. Passing gas but no other bowel movement yet. Tolerated AM dose of dexamethasone. Not other acute new symptoms. Encouraged him to use incentive spirometry.   OBJECTIVE:  PHYSICAL EXAMINATION: . Filed Vitals:   07/21/15 0800 07/21/15 0908 07/21/15 1000 07/21/15 1200  BP:  139/70    Pulse: 95 76    Temp: 98 F (36.7 C)   98 F (36.7 C)  TempSrc: Oral   Oral  Resp: 18 20    Height:      Weight:      SpO2: 90% 94% 93%    Filed Weights   07/14/15 2306 07/19/15 1700  Weight: 206 lb 12.7 oz (93.8 kg) 208 lb (94.348 kg)   .Body mass index is 29.02 kg/(m^2).  GENERAL: SKIN:no acute rashes. EYES: normal, conjunctiva are pink and non-injected, sclera clear OROPHARYNX:no exudate, no erythema and lips, buccal mucosa, and tongue normal  NECK: supple, no JVD, thyroid normal size, non-tender, without nodularity LYMPH:  no palpable lymphadenopathy in the cervical, axillary or inguinal LUNGS:Decreased breath sounds rt base. No overt rales noted. HEART: regular rate & rhythm,  no murmurs and trace lower extremity edema ABDOMEN: abdomen distended, BS normoactive, laparoscopic incisions clean. PSYCH: alert & oriented x 3 with fluent speech NEURO: no focal motor/sensory deficits  MEDICAL HISTORY:  Past Medical History  Diagnosis Date  . EBV infection 2013    Patient notes that Donald [name] had an EBV infection in 2013 that left him with chronic fatigue. She also notes that Donald [name] had significant lymphadenopathy and thyroiditis at the time. Donald [name] has been managing his symptoms with an alternative medicine practitioner in [place] and with other alternative medicines.  . Marijuana use, episodic     SURGICAL HISTORY: Past  Surgical History  Procedure Laterality Date  . Tonsillectomy    . Appendectomy      SOCIAL HISTORY: Social History   Social History  . Marital Status: Married    Spouse Name: N/A  . Number of Children: N/A  . Years of Education: N/A   Occupational History  . Not on file.   Social History Main Topics  . Smoking status: Never Smoker   . Smokeless tobacco: Not on file  . Alcohol Use: No  . Drug Use: 1.00 per week    Special: Marijuana  . Sexual Activity: Not on file   Other Topics Concern  . 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   Donald [name] has a fiance and has been in a steady relationship.      Donald [name] has tried to maintain a very healthy lifestyle and cycles about 100 miles a week. Donald [name] also uses a fair number of over-the-counter alternative medicines to stay healthy.    FAMILY HISTORY: Family History  Problem Relation Age of Onset  . Heart failure Father     ALLERGIES:  is allergic to fluoride preparations and sulfa antibiotics.  MEDICATIONS:  Scheduled Meds: . allopurinol  150 mg Oral BID  . dexamethasone  4 mg Intravenous BID AC  . enoxaparin (LOVENOX) injection  40 mg Subcutaneous Q24H  . furosemide  20 mg Intravenous Once  . pantoprazole  40 mg Oral BID  AC  . senna-docusate  2 tablet Oral QHS  . sodium chloride  10-40 mL Intracatheter Q12H   Continuous Infusions: . sodium chloride 10 mL/hr at 07/15/15 1023   PRN Meds:.gi cocktail, HYDROcodone-acetaminophen, LORazepam, methocarbamol, morphine injection, ondansetron (ZOFRAN) IV, sodium chloride  REVIEW OF SYSTEMS:    10 Point review of Systems was done is negative except as noted above.   LABORATORY DATA:  I have reviewed the data as listed  . CBC Latest Ref Rng 07/20/2015 07/20/2015 07/19/2015  WBC 4.0 - 10.5 K/uL 9.8 9.4 8.5  Hemoglobin 13.0 - 17.0 g/dL 14.1 14.7 16.0  Hematocrit 39.0 - 52.0 % 41.9 43.1 46.7    Platelets 150 - 400 K/uL 231 229 210    . CMP Latest Ref Rng 07/21/2015 07/20/2015 07/20/2015  Glucose 65 - 99 mg/dL 141(H) 111(H) 76  BUN 6 - 20 mg/dL 24(H) 24(H) 22(H)  Creatinine 0.61 - 1.24 mg/dL 1.18 1.39(H) 1.54(H)  Sodium 135 - 145 mmol/L 136 141 138  Potassium 3.5 - 5.1 mmol/L 4.2 4.8 4.2  Chloride 101 - 111 mmol/L 99(L) 104 102  CO2 22 - 32 mmol/L 25 20(L) 21(L)  Calcium 8.9 - 10.3 mg/dL 7.5(L) 7.7(L) 7.5(L)  Total Protein 6.5 - 8.1 g/dL 5.1(L) 5.2(L) 4.9(L)  Total Bilirubin 0.3 - 1.2 mg/dL 0.6 1.2 0.6  Alkaline Phos 38 - 126 U/L 35(L) 43 40  AST 15 - 41 U/L 63(H) 76(H) 78(H)  ALT 17 - 63 U/L 53 59 61     RADIOGRAPHIC STUDIES: I have personally reviewed the radiological images as listed and agreed with the findings in the report. Dg Chest 2 View  07/15/2015   CLINICAL DATA:  Followup right pleural effusion, status post right thoracentesis.  EXAM: CHEST  2 VIEW  COMPARISON:  Yesterday.  FINDINGS: Stable right upper companion rib shadows. No pneumothorax seen. Significantly decreased right pleural fluid. The interstitial markings remain mildly prominent. Normal sized heart. Interval mild left lower lobe linear density. Unremarkable bones.  IMPRESSION: 1. No pneumothorax following right thoracentesis. 2. Significantly less right pleural fluid. 3. Interval mild left lower lobe atelectasis. 4. Stable mild chronic interstitial lung disease.   Electronically Signed   By: Claudie Revering M.D.   On: 07/15/2015 17:21   Dg Chest 2 View  07/14/2015   CLINICAL DATA:  Right-sided chest pain for 2-3 days. Shortness of breath.  EXAM: CHEST  2 VIEW  COMPARISON:  07/12/2015  FINDINGS: Heart size is probably normal. Suspect lung opacity at the right base in addition to the elevated hemidiaphragm. Opacity at the right lung base may be related to subpulmonic effusion. Overall the appearance is stable since previous exam. There is a trace left pleural effusion. No pulmonary edema.  IMPRESSION: Persistent  significant opacity at the right lung base. Consider right lateral decubitus view to determine component of layering pleural effusion.   Electronically Signed   By: Nolon Nations M.D.   On: 07/14/2015 16:56   Dg Chest 2 View  07/12/2015   CLINICAL DATA:  Anterior chest pain for 1 week with cough  EXAM: CHEST  2 VIEW  COMPARISON:  None.  FINDINGS: There is mild elevation the right hemidiaphragm. There is evidence suggesting consolidation in the medial right base. Lungs elsewhere clear. Heart size and pulmonary vascularity are normal. No adenopathy. No bone lesions.  IMPRESSION: Evidence of a degree of consolidation in the medial right base. There is elevation of the right hemidiaphragm. Lungs elsewhere clear. Cardiac silhouette within normal limits.  Followup PA and lateral chest radiographs recommended in 3-4 weeks following trial of antibiotic therapy to ensure resolution and exclude underlying malignancy.   Electronically Signed   By: Lowella Grip III M.D.   On: 07/12/2015 15:52   Ct Angio Chest Pe W/cm &/or Wo Cm  07/14/2015   CLINICAL DATA:  Right chest pain  EXAM: CT ANGIOGRAPHY CHEST WITH CONTRAST  TECHNIQUE: Multidetector CT imaging of the chest was performed using the standard protocol during bolus administration of intravenous contrast. Multiplanar CT image reconstructions and MIPs were obtained to evaluate the vascular anatomy.  CONTRAST:  160mL OMNIPAQUE IOHEXOL 350 MG/ML SOLN  COMPARISON:  Chest x-ray 07/14/2015  FINDINGS: Negative for pulmonary embolism. Negative for aortic dissection or aneurysm  Moderately large right pleural effusion with compressive atelectasis of the right lower lobe. Atelectasis also present in the right middle lobe. Partial collapse of the right upper lobe medially adjacent to the mediastinum. This extends into the right upper lobe laterally. Small left pleural effusion. No significant infiltrate on the left.  No adenopathy is present in the mediastinum.  No acute  bony abnormality  In the upper abdomen, there is mild to moderate ascites. This could be due to liver disease. There is fluid around the liver and spleen. The spleen is not enlarged. Limited imaging of the pancreas and kidneys are grossly normal. There is soft tissue infiltration in the omentum. Carcinoma not excluded.  Review of the MIP images confirms the above findings.  IMPRESSION: Negative for pulmonary embolism  Moderate right pleural effusion with compressive atelectasis in the right lower lobe. Additional atelectasis in the right middle lobe and right upper lobe. No definite mass or pneumonia however infection and tumor are in the differential. There is a small left effusion  Concerning findings in the abdomen. There is ascites around the liver and spleen. There is soft tissue density involving the omentum which can be seen with carcinoma. Further imaging of the abdomen with CT abdomen pelvis with contrast is suggested.   Electronically Signed   By: Franchot Gallo M.D.   On: 07/14/2015 18:28   Ct Abdomen Pelvis W Contrast  07/15/2015   CLINICAL DATA:  39 year old male with ascites and potential omental disease noted on prior chest CT. Follow-up abdominal CT scan.  EXAM: CT ABDOMEN AND PELVIS WITH CONTRAST  TECHNIQUE: Multidetector CT imaging of the abdomen and pelvis was performed using the standard protocol following bolus administration of intravenous contrast.  CONTRAST:  165mL OMNIPAQUE IOHEXOL 300 MG/ML  SOLN  COMPARISON:  CT of the chest 07/14/2015. CT of the pelvis 03/03/2008.  FINDINGS: Lower chest: Small bilateral pleural effusions (left greater than right). Subsegmental atelectasis in the posterior aspect of the right lower lobe.  Hepatobiliary: No cystic or solid hepatic lesion identified. No intra or extrahepatic biliary ductal dilatation. High attenuation material filling the gallbladder, presumably vicarious excretion of contrast material from the recent chest CT.  Pancreas: No pancreatic  mass. No pancreatic ductal dilatation. No pancreatic or peripancreatic fluid or inflammatory changes.  Spleen: Unremarkable.  Adrenals/Urinary Tract: Bilateral adrenal glands and bilateral kidneys are unremarkable in appearance. No hydroureteronephrosis. Urinary bladder is unremarkable in appearance.  Stomach/Bowel: In the mid small bowel (like mid to distal jejunum) there is a profoundly asymmetrically thickened loop of bowel best demonstrated on image 56 of series 2, where the wall measures up to 22 mm in thickness medially. Adjacent to this there are soft tissue masses extending into the root of the small bowel mesentery, largest  of which measures 2.7 x 3.3 cm (image 47 of series 2), likely malignant lymph nodes. The appendix is not confidently identified. No pathologic dilatation of small bowel or colon. Stomach is grossly normal in appearance.  Vascular/Lymphatic: No significant atherosclerotic disease, aneurysm or dissection identified in the abdominal or pelvic vasculature. Circumaortic left renal vein (normal anatomical variant) incidentally noted. Probable mesenteric lymphadenopathy, as discussed above.  Reproductive: Prostate gland and seminal vesicles are unremarkable in appearance. Inguinal canals and visualized portions of the scrotum were grossly unremarkable in appearance. No lymphadenopathy noted along the gonadal drainage system.  Other: Large amount of abnormal enhancing soft tissue throughout the peritoneal cavity, most notable in the omentum, particularly in the right upper quadrant where there is bulky omental caking. Small volume of presumably malignant ascites. No pneumoperitoneum. Some abnormal soft tissue is extending through the anterior abdominal wall into the periumbilical region (image 53 of series 2).  Musculoskeletal: There are no aggressive appearing lytic or blastic lesions noted in the visualized portions of the skeleton.  IMPRESSION: 1. Widespread intraperitoneal malignancy, as  demonstrated by extensive peritoneal nodularity and enhancement, widespread omental caking, likely malignant ascites and extensive mesenteric lymphadenopathy. The exact source of malignancy is uncertain, however, given the focally thickened loop of small bowel (likely jejunal) in the left side of the central abdomen (image 56 of series 2), a primary small bowel malignancy is suspected. 2. Small bilateral pleural effusions (left greater than right). Right-sided pleural effusion significantly decreased following thoracentesis. Resolving subsegmental atelectasis in the right lower lobe. 3. Additional incidental findings, as above. These results were called by telephone at the time of interpretation on 07/15/2015 at 5:33 pm to Dr. Olevia Bowens, who verbally acknowledged these results.   Electronically Signed   By: Vinnie Langton M.D.   On: 07/15/2015 17:34   US Biopsy  07/17/2015   CLINICAL DATA:  Extensive peritoneal nodularity and omental caking of uncertain etiology. Periumbilical masses.  EXAM: ULTRASOUND-GUIDED CORE PERIUMBILICAL BIOPSY  TECHNIQUE: An ultrasound guided biopsy was thoroughly discussed with the patient and questions were answered. The benefits, risks, alternatives, and complications were also discussed. The patient understands and wishes to proceed with the procedure. A verbal as well as written consent was obtained.  Survey ultrasound of the periumbilical region was performed and an appropriate skin entry site was determined. Skin site was marked, prepped with chlorhexidine, and draped in usual sterile fashion, and infiltrated locally with 1% lidocaine.  Intravenous Fentanyl and Versed were administered as conscious sedation during continuous cardiorespiratory monitoring by the radiology RN, with a total moderate sedation time of less than 30 minutes.  Under real-time ultrasound guidance, a 17 gauge trocar needle was advanced to the margin of the lesion. Once needle tip position was confirmed, coaxial  18-gauge core biopsy samples were obtained, submitted in saline to surgical pathology. The guide needle was removed. Postprocedure scans show no hemorrhage or other apparent complication.  COMPLICATIONS: COMPLICATIONS None immediate  FINDINGS: Nodular periumbilical subcutaneous masses were localized. Core biopsy samples were obtained without complication.  IMPRESSION: 1. Technically successful ultrasound guided core periumbilical nodule biopsy.   Electronically Signed   By: Lucrezia Europe M.D.   On: 07/17/2015 16:15   US Paracentesis  07/17/2015   CLINICAL DATA:  Omental disease and peritoneal nodules.  EXAM: ULTRASOUND GUIDED PARACENTESIS  TECHNIQUE: The procedure, risks (including but not limited to bleeding, infection, organ damage ), benefits, and alternatives were explained to the patient. Questions regarding the procedure were encouraged and answered. The patient understands  and consents to the procedure. Survey ultrasound of the abdomen was performed and an appropriate skin entry site in the right lower abdomen was selected. Skin site was marked, prepped with Betadine, and draped in usual sterile fashion, and infiltrated locally with 1% lidocaine. A 5 French multisidehole Yueh sheath needle was advanced into the peritoneal space until fluid could be aspirated. The sheath was advanced and the needle removed. 1.2 L of cloudy yellow ascites were aspirated. A sample sent to cytology for the requested studies.  COMPLICATIONS: COMPLICATIONS none  IMPRESSION: Technically successful ultrasound guided paracentesis, removing 1.2 L of ascites. Sample to cytology.   Electronically Signed   By: Lucrezia Europe M.D.   On: 07/17/2015 16:17   Dg Chest Port 1 View  07/21/2015   CLINICAL DATA:  Acute respiratory failure with hypoxemia. Nonsmoker.  EXAM: PORTABLE CHEST - 1 VIEW  COMPARISON:  07/20/2015.  FINDINGS: Cardiomediastinal silhouette appears within normal limits for size. Diffuse pulmonary opacities persist but are  slightly improved consistent with resolving edema. Decreased BILATERAL pleural effusions. PICC line tip SVC. No pneumothorax.  IMPRESSION: Improving pulmonary edema.   Electronically Signed   By: Staci Righter M.D.   On: 07/21/2015 09:38   Dg Chest Port 1 View  07/20/2015   CLINICAL DATA:  Shortness of Breath  EXAM: PORTABLE CHEST - 1 VIEW  COMPARISON:  July 15, 2015  FINDINGS: There is alveolar edema throughout both lungs. There are small bilateral effusions. Heart is upper normal in size. There is mild pulmonary venous hypertension. No adenopathy.  IMPRESSION: Extensive edema. Question a degree of congestive heart failure versus noncardiogenic edema. Note that cardiac silhouette is unchanged compared to recent prior study. No adenopathy apparent.   Electronically Signed   By: Lowella Grip III M.D.   On: 07/20/2015 15:36    ASSESSMENT & PLAN:   Patient is a 39 yo male admitted with   1) Likely high grade B-cell lymphoma (based on limited data from previous flow and biopsy- discussed with Dr Monica Martinez) with spontaneous TLS. ? EBV related given h/o bothersome infection in 2013) Biopsy was not sufficient to get to a definitive diagnosis and so surgery was consulted and patient had a laparoscopic excision biopsy of peritoneal nodules and nodules on a part of the falciform ligament (07/20/2015) 2) Post-operative hypoxic respiratory failure with CXR showing b/l infiltrates ?Fluid overload vs ARDS from tumor lysis syndrome triggered by anesthesia/tissue handing. TLS labs stable. Likely fluid overload. Off IVF and improved with some diuresis. 2) Spontaneous TLS - uric acid normalized with allopurinol. Some bump in LFts now improved. Restarted on lower dose of Allopurinol. 3) AKI ?related to contrast/urate nephropathy less likely urinary obstruction. Creatinine has now improved from 1.54 to 1.1 with IVF. 4) Elevated transaminases - ? Related to allopurinol. Improved today. Uric acid normal normal 5)  Elevated free T4 - ? Related to his supplements vs ?thyroiditis vs central hyperthyroidism vs thyroid involvement by same neoplastic-cn process vs paraneoplastic process. Plan - appreciate surgical input for laparoscopic biopsy -ARDS vs fluid overload - being evaluated by PCCM, will need CVC to check CVP and management fluid status according (IVF vs diuretics) -continue allopurinol at lower dose/split for continued TLS prophylaxis @ 150mg  po BID -will need to consider using Rasburicase if significant TLS -ECHO done few days abck EF WNL -inpatient PET/CT scan arranged for Monday 07/23/2015 (needs to be NPO then) -dexamethasone 4 mg IV BID today then rpt TLS labs. If stable might use a higher dose tomorrow. -  f/u final pathology results on Monday to determine definitive plan -encourage patient to be out of bed and use incentive spirometry -ordered Senna-S 2 tab HS for constipation. May discontinue if constipation ensues. -might need to consider endocrinology consultation on Monday for Possible hyperthyroidism if no overt findings to explain this on PET/CT -(ordered free T3, total T4, thyroglobulin panel and thyroid simulating immunoglobulin) Will continue to follow daily.  Appreciate cares by surgery, PCCM and the hospitalist team. Kindly call if questions.   Sullivan Lone MD [place] AAHIVMS [facility] Hematology/Oncology Physician [facility]  (Office):       [phone] (Work cell):  [phone] (Fax):           [phone]  07/21/2015 1:55 PM

## 2015-07-21 NOTE — Progress Notes (Signed)
PCCM PROGRESS NOTE  ADMISSION DATE: 07/14/2015 CONSULT DATE: 07/20/2015 REFERRING PROVIDER: [facility]  CC: Hypoxia  SUBJECTIVE: Breathing better.  Denies chest pain.  Concerned about getting too many medicines.  OBJECTIVE: Temp:  [98 F (36.7 C)-99.4 F (37.4 C)] 98 F (36.7 C) (09/10 0800) Pulse Rate:  [72-116] 76 (09/10 0908) Resp:  [14-24] 20 (09/10 0908) BP: (131-158)/(62-95) 139/70 mmHg (09/10 0908) SpO2:  [89 %-100 %] 94 % (09/10 0908) FiO2 (%):  [55 %] 55 % (09/09 2200) General: pleasant HEENT: pupils reactive Cardiac: regular, no murmur Chest: decreased BS Rt base, no wheeze Abd: soft Ext: no edema Neuro: normal strength Skin: no rashes   CMP Latest Ref Rng 07/21/2015 07/20/2015 07/20/2015  Glucose 65 - 99 mg/dL 141(H) 111(H) 76  BUN 6 - 20 mg/dL 24(H) 24(H) 22(H)  Creatinine 0.61 - 1.24 mg/dL 1.18 1.39(H) 1.54(H)  Sodium 135 - 145 mmol/L 136 141 138  Potassium 3.5 - 5.1 mmol/L 4.2 4.8 4.2  Chloride 101 - 111 mmol/L 99(L) 104 102  CO2 22 - 32 mmol/L 25 20(L) 21(L)  Calcium 8.9 - 10.3 mg/dL 7.5(L) 7.7(L) 7.5(L)  Total Protein 6.5 - 8.1 g/dL 5.1(L) 5.2(L) 4.9(L)  Total Bilirubin 0.3 - 1.2 mg/dL 0.6 1.2 0.6  Alkaline Phos 38 - 126 U/L 35(L) 43 40  AST 15 - 41 U/L 63(H) 76(H) 78(H)  ALT 17 - 63 U/L 53 59 61     CBC Latest Ref Rng 07/20/2015 07/20/2015 07/19/2015  WBC 4.0 - 10.5 K/uL 9.8 9.4 8.5  Hemoglobin 13.0 - 17.0 g/dL 14.1 14.7 16.0  Hematocrit 39.0 - 52.0 % 41.9 43.1 46.7  Platelets 150 - 400 K/uL 231 229 210     Dg Chest Port 1 View  07/21/2015   CLINICAL DATA:  Acute respiratory failure with hypoxemia. Nonsmoker.  EXAM: PORTABLE CHEST - 1 VIEW  COMPARISON:  07/20/2015.  FINDINGS: Cardiomediastinal silhouette appears within normal limits for size. Diffuse pulmonary opacities persist but are slightly improved consistent with resolving edema. Decreased BILATERAL pleural effusions. PICC line tip SVC. No pneumothorax.  IMPRESSION: Improving pulmonary edema.    Electronically Signed   By: Staci Righter M.D.   On: 07/21/2015 09:38   Dg Chest Port 1 View  07/20/2015   CLINICAL DATA:  Shortness of Breath  EXAM: PORTABLE CHEST - 1 VIEW  COMPARISON:  July 15, 2015  FINDINGS: There is alveolar edema throughout both lungs. There are small bilateral effusions. Heart is upper normal in size. There is mild pulmonary venous hypertension. No adenopathy.  IMPRESSION: Extensive edema. Question a degree of congestive heart failure versus noncardiogenic edema. Note that cardiac silhouette is unchanged compared to recent prior study. No adenopathy apparent.   Electronically Signed   By: Lowella Grip III M.D.   On: 07/20/2015 15:36   STUDIES: 9/03 CT chest/abd/pelvis >> mod Rt effusion, mod ascites, soft tissue density involving omentum 9/04 Rt thoracentesis >> 1500 ml fluid, findings consistent with B cell lymphoproliferative process 9/06 Echo >> EF 55 to 60% 6/38 IR bx periumbilical mass >>  7/56 Paracentesis >> findings consistent with B cell lymphoproliferative process  EVENTS: 9/03 Admit 9/05 Oncology consulted 9/09 Laparoscopic bx of omentum; post-op hypoxia  DISCUSSION: 39 yo male with back pain/chest pain and found to have omental caking and Rt pleural effusion most likely from High grade B cell Lymphoma.  He developed hypoxia after laparoscopic bx 9/09.  ASSESSMENT/PLAN:  Acute hypoxic respiratory failure 2nd to pulmonary infiltrates >> most likely pulmonary edema. Plan: -  give additional lasix in PM 9/10 - f/u CXR 9/11 - oxygen to keep SpO2 > 92%  Omental mass, ascites, Rt pleural effusion most likely from high grade B cell lymphoma. AKI with concern for tumor lysis syndrome. Plan: - per primary team and oncology - f/u BMET   Chesley Mires, MD [place] 07/21/2015, 10:51 AM Pager:  [phone] After 3pm call: [phone]

## 2015-07-21 NOTE — Progress Notes (Addendum)
[facility] PROGRESS NOTE  Donald [name] GEX:[id number] DOB: [birth year] DOA: 07/14/2015 PCP: Drema Pry, DO  brief narrative 39 year old male with no medical history presented with 2 weeks of back pain while riding his bike. He had a chest x-ray done as outpatient which suggested a possible infection and was given antibiotic. However patient returned to the ED as he had right chest pain and was short of breath. In the ED was found to have a large right-sided pleural effusion and was admitted for further workup. Patient transferred to [facility] long for excisional core biopsy.  Assessment/Plan: Pleural effusion, right/abdominal mass suggestive of B cell lymphoma: -CT scan of the chest with contrast: -  significant effusion with compression of the right lung. -CT scan of the abdomen :- peritoneal carcinomatosis with no lymphadenopathy -right  Thoracocentesis suggests exudate. -oncology following. Started on decadron after laparoscopic excisional biopsy on 9/9. PET scan scheduled for 9/12 as inpatient. PICC line placed on 9/9 -[place] core biopsy of abd mass and diagnostic/therapeutic paracentesis. tissue flow cytometry suggests proliferative B cell.   Postop acute hypoxemic respiratory failure on 9/9 Patient extubated and placed on nonrebreather. Given 10 mg IV Lasix. Lactic acid mildly elevated. Chest x-ray shows extensive pulmonary edema. Discontinued IV fluids.Currently maintaining sats on 3 L via nasal cannula.  Ordered another 20 mg IV Lasix. Repeat chest x-ray this morning shows improvement of pulmonary edema. -Continue step down monitoring.   Acute kidney injury -possibly due to tumor lysis syndrome and IV contrast -Start IV fluids on 9/9 given acute pulmonary edema postop. Renal function improving. Does not need renal ultrasound.    Tumor lysis syndrome: -started on allopurinol but given worsened LFTs it was held. -Renal function improving and uric acid normal. Stopped  IV fluids due  to fluid overload.  GERD Added PPI twice a day and Maalox  Screening process: -non reactive HIV -CMV IGM and antibody neg -EBV results (DNA by PCR and VCA antibody) pending  -ESR 6 -CRP 4.3 -hepatitis panel neg -AFP 4.5 -HCG tumor marker < 1      DVT prophylaxis: sq heparin  Code Status: Full   Consultants:   IR Oncology  [facility]  Procedures:  Thoracocentesis  Paracentesis  laproscopy abdominal biopsy  Antibiotics:  None  HPI/Subjective: Patient became hypoxic after laproscopic biopsy yesterday. Was extubated and placed on nonrebreather. Given 10 mg IV Lasix and started to improve. Chest x-ray showing pulmonary edema. His blood this morning and maintaining O2 sat on nasal cannula. Complains of right-sided chest discomfort.  Objective: Filed Vitals:   07/21/15 0908  BP: 139/70  Pulse: 76  Temp:   Resp: 20    Intake/Output Summary (Last 24 hours) at 07/21/15 1028 Last data filed at 07/21/15 0900  Gross per 24 hour  Intake   2020 ml  Output   1250 ml  Net    770 ml   Filed Weights   07/14/15 2306 07/19/15 1700  Weight: 93.8 kg (206 lb 12.7 oz) 94.348 kg (208 lb)    Exam:   General:   no acute distress  HEENT: Moist oral mucosa, supple neck  Chest: Diminished breath sounds over right lung  CVS: Normal S1 and S2, no murmurs  GI: Soft, abdominal distention with some tenderness, bowel sounds present  Musculoskeletal: Warm, no edema  CNS: Alert and oriented  Data Reviewed: Basic Metabolic Panel:  Recent Labs Lab 07/16/15 1136  07/18/15 0920 07/19/15 1019 07/20/15 0407 07/20/15 1646 07/20/15 1815 07/21/15 0538  NA 138  < >  136 137 138 141  --  136  K 4.3  < > 4.0 4.1 4.2 4.8  --  4.2  CL 104  < > 95* 101 102 104  --  99*  CO2 20*  < > 31 23 21* 20*  --  25  GLUCOSE 80  < > 118* 91 76 111*  --  141*  BUN 13  < > 12 16 22* 24*  --  24*  CREATININE 1.18  < > 1.33* 1.72* 1.54* 1.39*  --  1.18  CALCIUM 8.2*  < > 8.2*  8.0* 7.5* 7.7*  --  7.5*  MG  --   --   --   --  1.8  --   --   --   PHOS 2.7  --  2.1*  --  5.8*  --  6.0*  --   < > = values in this interval not displayed. Liver Function Tests:  Recent Labs Lab 07/18/15 0920 07/19/15 1019 07/20/15 0407 07/20/15 1646 07/21/15 0538  AST 118* 110* 78* 76* 63*  ALT 85* 80* 61 59 53  ALKPHOS 50 48 40 43 35*  BILITOT 0.8 0.9 0.6 1.2 0.6  PROT 5.3* 5.0* 4.9* 5.2* 5.1*  ALBUMIN 2.8* 2.7* 2.6* 2.8* 2.7*   No results for input(s): LIPASE, AMYLASE in the last 168 hours. No results for input(s): AMMONIA in the last 168 hours. CBC:  Recent Labs Lab 07/17/15 0342 07/18/15 0920 07/19/15 1019 07/20/15 0407 07/20/15 1646  WBC 7.1 6.8 8.5 9.4 9.8  NEUTROABS 4.5 4.2 5.9 5.8 8.6*  HGB 14.4 16.0 16.0 14.7 14.1  HCT 41.6 46.4 46.7 43.1 41.9  MCV 85.1 86.1 85.7 86.4 86.9  PLT 199 200 210 229 231   Cardiac Enzymes:  Recent Labs Lab 07/21/15 0538  TROPONINI <0.03   BNP (last 3 results)  Recent Labs  07/20/15 1646  BNP 13.9    ProBNP (last 3 results) No results for input(s): PROBNP in the last 8760 hours.  CBG: No results for input(s): GLUCAP in the last 168 hours.  Recent Results (from the past 240 hour(s))  AFB culture with smear     Status: None (Preliminary result)   Collection Time: 07/15/15  3:23 PM  Result Value Ref Range Status   Specimen Description PLEURAL RIGHT FLUID  Final   Special Requests PLEURAL RIGHT FLUID  Final   Acid Fast Smear   Final    NO ACID FAST BACILLI SEEN Performed at [facility]    Culture   Final    CULTURE WILL BE EXAMINED FOR 6 WEEKS BEFORE ISSUING A FINAL REPORT Performed at [facility]    Report Status PENDING  Incomplete  Fungus Culture with Smear     Status: None (Preliminary result)   Collection Time: 07/15/15  3:23 PM  Result Value Ref Range Status   Specimen Description PLEURAL RIGHT FLUID  Final   Special Requests PLEURAL RIGHT FLUID  Final   Fungal Smear   Final     NO YEAST OR FUNGAL ELEMENTS SEEN Performed at [facility]    Culture   Final    CULTURE IN PROGRESS FOR FOUR WEEKS Performed at [facility]    Report Status PENDING  Incomplete  Culture, body fluid-bottle     Status: None   Collection Time: 07/15/15  3:23 PM  Result Value Ref Range Status   Specimen Description FLUID PLEURAL  Final   Special Requests BOTTLES DRAWN AEROBIC AND  ANAEROBIC 5CC  Final   Culture NO GROWTH 5 DAYS  Final   Report Status 07/20/2015 FINAL  Final  Gram stain     Status: None   Collection Time: 07/15/15  3:23 PM  Result Value Ref Range Status   Specimen Description FLUID PLEURAL  Final   Special Requests NONE  Final   Gram Stain   Final    ABUNDANT WBC PRESENT,BOTH PMN AND MONONUCLEAR NO ORGANISMS SEEN GRAM STAIN REVIEWED-AGREE WITH RESULT K WOOLEN    Report Status 07/15/2015 FINAL  Final  Surgical pcr screen     Status: None   Collection Time: 07/19/15 10:12 PM  Result Value Ref Range Status   MRSA, PCR NEGATIVE NEGATIVE Final   Staphylococcus aureus NEGATIVE NEGATIVE Final    Comment:        The Xpert SA Assay (FDA approved for NASAL specimens in patients over 2 years of age), is one component of a comprehensive surveillance program.  Test performance has been validated by [facility] for patients greater than or equal to 1 year old. It is not intended to diagnose infection nor to guide or monitor treatment.      Studies: Dg Chest Port 1 View  07/21/2015   CLINICAL DATA:  Acute respiratory failure with hypoxemia. Nonsmoker.  EXAM: PORTABLE CHEST - 1 VIEW  COMPARISON:  07/20/2015.  FINDINGS: Cardiomediastinal silhouette appears within normal limits for size. Diffuse pulmonary opacities persist but are slightly improved consistent with resolving edema. Decreased BILATERAL pleural effusions. PICC line tip SVC. No pneumothorax.  IMPRESSION: Improving pulmonary edema.   Electronically Signed   By: Staci Righter M.D.   On:  07/21/2015 09:38   Dg Chest Port 1 View  07/20/2015   CLINICAL DATA:  Shortness of Breath  EXAM: PORTABLE CHEST - 1 VIEW  COMPARISON:  July 15, 2015  FINDINGS: There is alveolar edema throughout both lungs. There are small bilateral effusions. Heart is upper normal in size. There is mild pulmonary venous hypertension. No adenopathy.  IMPRESSION: Extensive edema. Question a degree of congestive heart failure versus noncardiogenic edema. Note that cardiac silhouette is unchanged compared to recent prior study. No adenopathy apparent.   Electronically Signed   By: Lowella Grip III M.D.   On: 07/20/2015 15:36    Scheduled Meds: . allopurinol  150 mg Oral BID  . dexamethasone  4 mg Intravenous BID AC  . enoxaparin (LOVENOX) injection  40 mg Subcutaneous Q24H  . pantoprazole  40 mg Oral BID AC  . sodium chloride  10-40 mL Intracatheter Q12H   Continuous Infusions: . sodium chloride 10 mL/hr at 07/15/15 1023     Time spent: 25 minutes    Vetra Shinall, [facility] Pager [phone] If 7PM-7AM, please contact night-coverage at [email or web address], password [facility] 07/21/2015, 10:28 AM  LOS: 7 days

## 2015-07-22 ENCOUNTER — Inpatient Hospital Stay (HOSPITAL_COMMUNITY): Payer: BLUE CROSS/BLUE SHIELD

## 2015-07-22 LAB — BASIC METABOLIC PANEL
ANION GAP: 8 (ref 5–15)
BUN: 18 mg/dL (ref 6–20)
CHLORIDE: 100 mmol/L — AB (ref 101–111)
CO2: 30 mmol/L (ref 22–32)
Calcium: 7.7 mg/dL — ABNORMAL LOW (ref 8.9–10.3)
Creatinine, Ser: 0.87 mg/dL (ref 0.61–1.24)
GFR calc non Af Amer: >60 mL/min (ref >60)
GLUCOSE: 159 mg/dL — AB (ref 65–99)
POTASSIUM: 3.3 mmol/L — AB (ref 3.5–5.1)
Sodium: 138 mmol/L (ref 135–145)

## 2015-07-22 LAB — URIC ACID: Uric Acid, Serum: 6.6 mg/dL (ref 4.4–7.6)

## 2015-07-22 LAB — CALCIUM, IONIZED: CALCIUM, IONIZED, SERUM: 4.2 mg/dL — AB (ref 4.5–5.6)

## 2015-07-22 MED ORDER — POTASSIUM CHLORIDE CRYS ER 20 MEQ PO TBCR
40.0000 meq | EXTENDED_RELEASE_TABLET | Freq: Once | ORAL | Status: AC
  Administered 2015-07-22: 40 meq via ORAL
  Filled 2015-07-22: qty 2

## 2015-07-22 MED ORDER — DEXAMETHASONE SODIUM PHOSPHATE 4 MG/ML IJ SOLN
4.0000 mg | Freq: Three times a day (TID) | INTRAMUSCULAR | Status: DC
  Administered 2015-07-22 – 2015-07-24 (×6): 4 mg via INTRAVENOUS
  Filled 2015-07-22 (×6): qty 1

## 2015-07-22 MED ORDER — FUROSEMIDE 10 MG/ML IJ SOLN
20.0000 mg | Freq: Once | INTRAMUSCULAR | Status: AC
  Administered 2015-07-22: 20 mg via INTRAVENOUS
  Filled 2015-07-22: qty 2

## 2015-07-22 NOTE — Progress Notes (Addendum)
[facility] PROGRESS NOTE  Donald [name] QIO:[id number] DOB: 30-Nov-1975 DOA: 07/14/2015 PCP: Drema Pry, DO  brief narrative 39 year old male with no medical history presented with 2 weeks of back pain while riding his bike. He had a chest x-ray done as outpatient which suggested a possible infection and was given antibiotic. However patient returned to the ED as he had right chest pain and was short of breath. In the ED was found to have a large right-sided pleural effusion and was admitted for further workup. Patient transferred to [facility] long for excisional core biopsy.  Assessment/Plan: Pleural effusion, right/abdominal mass suggestive of B cell lymphoma: -CT scan of the chest with contrast: -  significant effusion with compression of the right lung. -CT scan of the abdomen :- peritoneal carcinomatosis with no lymphadenopathy -right  Thoracocentesis suggests exudate. -oncology following. Started on decadron on 9/10 after laparoscopic excisional biopsy on 9/9. PET scan scheduled for 9/12 as inpatient. -PICC line placed on 9/9 -[place] core biopsy of omental mass and diagnostic/therapeutic paracentesis. tissue flow cytometry suggests proliferative B cell.   Postop acute hypoxemic respiratory failure on 9/9 Patient extubated and placed on nonrebreather. Given 10 mg IV Lasix. Lactic acid mildly elevated. Chest x-ray shows extensive pulmonary edema. Discontinued IV fluids. Received 2 doses of IV lasix ( total 40 mg on 9/10). Currently maintaining sats on 2.5 L via nasal cannula.  Continue incentive spirometry. Repeat chest x-ray showed improving pulmonary edema. Will order 20  mg po lasix x1. -transfer to medical floor.   Acute kidney injury -possibly due to tumor lysis syndrome and IV contrast -Start IV fluids on 9/9 given acute pulmonary edema postop. Renal function back to normal.    Tumor lysis syndrome: -Resumed allopurinol since LFTs improved. -Renal function improving and uric  acid normal. Stopped  IV fluids due to fluid overload.  Hypokalemia Replenished  GERD Added PPI twice a day and Maalox. Symptoms improved.  Elevated TSH and free T4 No clear etiology.? Thyroiditis versus secondary hyper thyroidism. Follow thyroglobulin antibody and stimulating immunoglobulin.  Screening process: -non reactive HIV -CMV IGM and antibody neg -EBV results (DNA by PCR and VCA antibody) pending  -ESR 6 -CRP 4.3 -hepatitis panel neg -AFP 4.5 -HCG tumor marker < 1     DVT prophylaxis: sq heparin  Diet: Regular   Consultants:  IR Oncology ( Dr Irene Limbo) [facility]  Procedures:  Thoracocentesis  Paracentesis  laproscopy abdominal biopsy  Antibiotics:  None  CODE STATUS: Full code Family communication: None at bedside today.  Disposition: Transferred to medical floor. PET scan on 9/12. Home possibly on 9/13 if bleeding continues to improve and outpatient therapy planned.   HPI/Subjective: Patient seen and examined. Reports his breathing and abdominal discomfort to be better. No overnight issues.  Objective: Filed Vitals:   07/22/15 0800  BP:   Pulse:   Temp: 98.2 F (36.8 C)  Resp:     Intake/Output Summary (Last 24 hours) at 07/22/15 1027 Last data filed at 07/21/15 2200  Gross per 24 hour  Intake    240 ml  Output    650 ml  Net   -410 ml   Filed Weights   07/14/15 2306 07/19/15 1700  Weight: 93.8 kg (206 lb 12.7 oz) 94.348 kg (208 lb)    Exam:   General:  Middle aged male in no acute distress  HEENT: Moist oral mucosa, supple neck, no JVD  Chest: Improved breath sounds over bilateral lungs  CVS: Normal S1 and S2, no murmurs  GI: Soft, abdominal distention, nontender, bowel sounds present  Musculoskeletal: Warm, no edema  CNS: Alert and oriented  Data Reviewed: Basic Metabolic Panel:  Recent Labs Lab 07/16/15 1136  07/18/15 0920  07/20/15 0407 07/20/15 1646 07/20/15 1815 07/21/15 0538 07/21/15 1800  07/22/15 0920  NA 138  < > 136  < > 138 141  --  136 138 138  K 4.3  < > 4.0  < > 4.2 4.8  --  4.2 3.7 3.3*  CL 104  < > 95*  < > 102 104  --  99* 98* 100*  CO2 20*  < > 31  < > 21* 20*  --  _0 GLUCOSE 80  < > 118*  < > 76 111*  --  141* 146* 159*  BUN 13  < > 12  < > 22* 24*  --  24* 20 18  CREATININE 1.18  < > 1.33*  < > 1.54* 1.39*  --  1.18 0.97 0.87  CALCIUM 8.2*  < > 8.2*  < > 7.5* 7.7*  --  7.5* 7.9* 7.7*  MG  --   --   --   --  1.8  --   --   --   --   --   PHOS 2.7  --  2.1*  --  5.8*  --  6.0*  --  3.7  --   < > = values in this interval not displayed. Liver Function Tests:  Recent Labs Lab 07/19/15 1019 07/20/15 0407 07/20/15 1646 07/21/15 0538 07/21/15 1800  AST 110* 78* 76* 63* 76*  ALT 80* 61 59 53 61  ALKPHOS 48 40 43 35* 40  BILITOT 0.9 0.6 1.2 0.6 0.6  PROT 5.0* 4.9* 5.2* 5.1* 5.5*  ALBUMIN 2.7* 2.6* 2.8* 2.7* 2.9*   No results for input(s): LIPASE, AMYLASE in the last 168 hours. No results for input(s): AMMONIA in the last 168 hours. CBC:  Recent Labs Lab 07/17/15 0342 07/18/15 0920 07/19/15 1019 07/20/15 0407 07/20/15 1646  WBC 7.1 6.8 8.5 9.4 9.8  NEUTROABS 4.5 4.2 5.9 5.8 8.6*  HGB 14.4 16.0 16.0 14.7 14.1  HCT 41.6 46.4 46.7 43.1 41.9  MCV 85.1 86.1 85.7 86.4 86.9  PLT 199 200 210 229 231   Cardiac Enzymes:  Recent Labs Lab 07/21/15 0538  TROPONINI <0.03   BNP (last 3 results)  Recent Labs  07/20/15 1646  BNP 13.9    ProBNP (last 3 results) No results for input(s): PROBNP in the last 8760 hours.  CBG: No results for input(s): GLUCAP in the last 168 hours.  Recent Results (from the past 240 hour(s))  AFB culture with smear     Status: None (Preliminary result)   Collection Time: 07/15/15  3:23 PM  Result Value Ref Range Status   Specimen Description PLEURAL RIGHT FLUID  Final   Special Requests PLEURAL RIGHT FLUID  Final   Acid Fast Smear   Final    NO ACID FAST BACILLI SEEN Performed at [facility]     Culture   Final    CULTURE WILL BE EXAMINED FOR 6 WEEKS BEFORE ISSUING A FINAL REPORT Performed at [facility]    Report Status PENDING  Incomplete  Fungus Culture with Smear     Status: None (Preliminary result)   Collection Time: 07/15/15  3:23 PM  Result Value Ref Range Status   Specimen Description PLEURAL RIGHT FLUID  Final   Special Requests PLEURAL  RIGHT FLUID  Final   Fungal Smear   Final    NO YEAST OR FUNGAL ELEMENTS SEEN Performed at [facility]    Culture   Final    CULTURE IN PROGRESS FOR FOUR WEEKS Performed at [facility]    Report Status PENDING  Incomplete  Culture, body fluid-bottle     Status: None   Collection Time: 07/15/15  3:23 PM  Result Value Ref Range Status   Specimen Description FLUID PLEURAL  Final   Special Requests BOTTLES DRAWN AEROBIC AND ANAEROBIC 5CC  Final   Culture NO GROWTH 5 DAYS  Final   Report Status 07/20/2015 FINAL  Final  Gram stain     Status: None   Collection Time: 07/15/15  3:23 PM  Result Value Ref Range Status   Specimen Description FLUID PLEURAL  Final   Special Requests NONE  Final   Gram Stain   Final    ABUNDANT WBC PRESENT,BOTH PMN AND MONONUCLEAR NO ORGANISMS SEEN GRAM STAIN REVIEWED-AGREE WITH RESULT K WOOLEN    Report Status 07/15/2015 FINAL  Final  Surgical pcr screen     Status: None   Collection Time: 07/19/15 10:12 PM  Result Value Ref Range Status   MRSA, PCR NEGATIVE NEGATIVE Final   Staphylococcus aureus NEGATIVE NEGATIVE Final    Comment:        The Xpert SA Assay (FDA approved for NASAL specimens in patients over 91 years of age), is one component of a comprehensive surveillance program.  Test performance has been validated by [facility] for patients greater than or equal to 62 year old. It is not intended to diagnose infection nor to guide or monitor treatment.      Studies: Dg Chest Port 1 View  07/22/2015   CLINICAL DATA:  Pulmonary edema, hypoxia  EXAM:  PORTABLE CHEST - 1 VIEW  COMPARISON:  07/21/2015  FINDINGS: Heart size is normal. Right-sided PICC line in place with tip over the cavoatrial junction. Hazy perihilar rate greater than left basilar airspace opacities are slightly decreased. Trace pleural effusions.  IMPRESSION: Interval slight decrease in hazy perihilar airspace opacities compatible with improving edema.   Electronically Signed   By: Conchita Paris M.D.   On: 07/22/2015 08:10   Dg Chest Port 1 View  07/21/2015   CLINICAL DATA:  Acute respiratory failure with hypoxemia. Nonsmoker.  EXAM: PORTABLE CHEST - 1 VIEW  COMPARISON:  07/20/2015.  FINDINGS: Cardiomediastinal silhouette appears within normal limits for size. Diffuse pulmonary opacities persist but are slightly improved consistent with resolving edema. Decreased BILATERAL pleural effusions. PICC line tip SVC. No pneumothorax.  IMPRESSION: Improving pulmonary edema.   Electronically Signed   By: Staci Righter M.D.   On: 07/21/2015 09:38   Dg Chest Port 1 View  07/20/2015   CLINICAL DATA:  Shortness of Breath  EXAM: PORTABLE CHEST - 1 VIEW  COMPARISON:  July 15, 2015  FINDINGS: There is alveolar edema throughout both lungs. There are small bilateral effusions. Heart is upper normal in size. There is mild pulmonary venous hypertension. No adenopathy.  IMPRESSION: Extensive edema. Question a degree of congestive heart failure versus noncardiogenic edema. Note that cardiac silhouette is unchanged compared to recent prior study. No adenopathy apparent.   Electronically Signed   By: Lowella Grip III M.D.   On: 07/20/2015 15:36    Scheduled Meds: . allopurinol  150 mg Oral BID  . dexamethasone  4 mg Intravenous TID PC  . enoxaparin (LOVENOX) injection  40 mg Subcutaneous Q24H  . pantoprazole  40 mg Oral BID AC  . potassium chloride  40 mEq Oral Once  . senna-docusate  2 tablet Oral QHS  . sodium chloride  10-40 mL Intracatheter Q12H   Continuous Infusions: . sodium chloride  10 mL/hr at 07/15/15 1023     Time spent: 25 minutes    Beverley Allender, [place]  [facility] Pager [phone] If 7PM-7AM, please contact night-coverage at [email or web address], password [facility] 07/22/2015, 10:27 AM  LOS: 8 days

## 2015-07-22 NOTE — Progress Notes (Signed)
[name]   HEMATOLOGY/ONCOLOGY INPATIENT PROGRESS NOTE  Date of Service: 07/22/2015  Inpatient Attending: .Louellen Molder, MD   SUBJECTIVE  Patient seen this afternoon. Transferred out of MICU. Breathing much improved with some diuresis. Tolerating dexamethasone without any issues. Jennet Maduro to know definitive diagnosis. ABdomen feeling less distended. No other acute new symptoms. Had bowel movement. Advancing po intake.  OBJECTIVE:  PHYSICAL EXAMINATION: . Filed Vitals:   07/22/15 0915 07/22/15 1100 07/22/15 1200 07/22/15 1300  BP: 126/71   131/77  Pulse: 80     Temp:   98.3 F (36.8 C) 97.8 F (36.6 C)  TempSrc:   Oral Oral  Resp: 23   60  Height:  5\' 11"  (1.803 m)    Weight:  202 lb 9.6 oz (91.9 kg)    SpO2: 93%   88%   Filed Weights   07/14/15 2306 07/19/15 1700 07/22/15 1100  Weight: 206 lb 12.7 oz (93.8 kg) 208 lb (94.348 kg) 202 lb 9.6 oz (91.9 kg)   .Body mass index is 28.27 kg/(m^2).  GENERAL: SKIN:no acute rashes. EYES: normal, conjunctiva are pink and non-injected, sclera clear OROPHARYNX:no exudate, no erythema and lips, buccal mucosa, and tongue normal  NECK: supple, no JVD, thyroid normal size, non-tender, without nodularity LYMPH:  no palpable lymphadenopathy in the cervical, axillary or inguinal LUNGS:Decreased breath sounds rt base. No overt rales noted. HEART: regular rate & rhythm,  no murmurs and trace lower extremity edema ABDOMEN: abdomen distended, BS normoactive, laparoscopic incisions clean. PSYCH: alert & oriented x 3 with fluent speech NEURO: no focal motor/sensory deficits  MEDICAL HISTORY:  Past Medical History  Diagnosis Date  . EBV infection 2013    Patient notes that he had an EBV infection in 2013 that left him with chronic fatigue. She also notes that he had significant lymphadenopathy and thyroiditis at the time. He has been managing his symptoms with an alternative medicine practitioner in [place] and with other alternative medicines.   . Marijuana use, episodic     SURGICAL HISTORY: Past Surgical History  Procedure Laterality Date  . Tonsillectomy    . Appendectomy      SOCIAL HISTORY: Social History   Social History  . Marital Status: Married    Spouse Name: N/A  . Number of Children: N/A  . Years of Education: N/A   Occupational History  . Not on file.   Social History Main Topics  . Smoking status: Never Smoker   . Smokeless tobacco: Not on file  . Alcohol Use: No  . Drug Use: 1.00 per week    Special: Marijuana  . Sexual Activity: Not on file   Other Topics Concern  . 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   He has a fiance and has been in a steady relationship.      He has tried to maintain a very healthy lifestyle and cycles about 100 miles a week. He also uses a fair number of over-the-counter alternative medicines to stay healthy.    FAMILY HISTORY: Family History  Problem Relation Age of Onset  . Heart failure Father     ALLERGIES:  is allergic to fluoride preparations and sulfa antibiotics.  MEDICATIONS:  Scheduled Meds: . allopurinol  150 mg Oral BID  . dexamethasone  4 mg Intravenous TID PC  . enoxaparin (LOVENOX) injection  40 mg Subcutaneous Q24H  . pantoprazole  40 mg Oral  BID AC  . senna-docusate  2 tablet Oral QHS  . sodium chloride  10-40 mL Intracatheter Q12H   Continuous Infusions: . sodium chloride 10 mL/hr at 07/15/15 1023   PRN Meds:.gi cocktail, HYDROcodone-acetaminophen, LORazepam, methocarbamol, morphine injection, ondansetron (ZOFRAN) IV, sodium chloride  REVIEW OF SYSTEMS:    10 Point review of Systems was done is negative except as noted above.   LABORATORY DATA:  I have reviewed the data as listed  . CBC Latest Ref Rng 07/20/2015 07/20/2015 07/19/2015  WBC 4.0 - 10.5 K/uL 9.8 9.4 8.5  Hemoglobin 13.0 - 17.0 g/dL 14.1 14.7 16.0  Hematocrit 39.0 - 52.0 %  41.9 43.1 46.7  Platelets 150 - 400 K/uL 231 229 210   . CBC    Component Value Date/Time   WBC 9.8 07/20/2015 1646   RBC 4.82 07/20/2015 1646   RBC 5.39 07/18/2015 0920   HGB 14.1 07/20/2015 1646   HCT 41.9 07/20/2015 1646   PLT 231 07/20/2015 1646   MCV 86.9 07/20/2015 1646   MCH 29.3 07/20/2015 1646   MCHC 33.7 07/20/2015 1646   RDW 12.8 07/20/2015 1646   LYMPHSABS 1.1 07/20/2015 1646   MONOABS 0.0* 07/20/2015 1646   EOSABS 0.0 07/20/2015 1646   BASOSABS 0.0 07/20/2015 1646      . CMP Latest Ref Rng 07/22/2015 07/21/2015 07/21/2015  Glucose 65 - 99 mg/dL 159(H) 146(H) 141(H)  BUN 6 - 20 mg/dL 18 20 24(H)  Creatinine 0.61 - 1.24 mg/dL 0.87 0.97 1.18  Sodium 135 - 145 mmol/L 138 138 136  Potassium 3.5 - 5.1 mmol/L 3.3(L) 3.7 4.2  Chloride 101 - 111 mmol/L 100(L) 98(L) 99(L)  CO2 22 - 32 mmol/L 30 31 25   Calcium 8.9 - 10.3 mg/dL 7.7(L) 7.9(L) 7.5(L)  Total Protein 6.5 - 8.1 g/dL - 5.5(L) 5.1(L)  Total Bilirubin 0.3 - 1.2 mg/dL - 0.6 0.6  Alkaline Phos 38 - 126 U/L - 40 35(L)  AST 15 - 41 U/L - 76(H) 63(H)  ALT 17 - 63 U/L - 61 53     RADIOGRAPHIC STUDIES: I have personally reviewed the radiological images as listed and agreed with the findings in the report. Dg Chest 2 View  07/15/2015   CLINICAL DATA:  Followup right pleural effusion, status post right thoracentesis.  EXAM: CHEST  2 VIEW  COMPARISON:  Yesterday.  FINDINGS: Stable right upper companion rib shadows. No pneumothorax seen. Significantly decreased right pleural fluid. The interstitial markings remain mildly prominent. Normal sized heart. Interval mild left lower lobe linear density. Unremarkable bones.  IMPRESSION: 1. No pneumothorax following right thoracentesis. 2. Significantly less right pleural fluid. 3. Interval mild left lower lobe atelectasis. 4. Stable mild chronic interstitial lung disease.   Electronically Signed   By: Claudie Revering M.D.   On: 07/15/2015 17:21   Dg Chest 2 View  07/14/2015   CLINICAL  DATA:  Right-sided chest pain for 2-3 days. Shortness of breath.  EXAM: CHEST  2 VIEW  COMPARISON:  07/12/2015  FINDINGS: Heart size is probably normal. Suspect lung opacity at the right base in addition to the elevated hemidiaphragm. Opacity at the right lung base may be related to subpulmonic effusion. Overall the appearance is stable since previous exam. There is a trace left pleural effusion. No pulmonary edema.  IMPRESSION: Persistent significant opacity at the right lung base. Consider right lateral decubitus view to determine component of layering pleural effusion.   Electronically Signed   By: Nolon Nations M.D.   On:  07/14/2015 16:56   Dg Chest 2 View  07/12/2015   CLINICAL DATA:  Anterior chest pain for 1 week with cough  EXAM: CHEST  2 VIEW  COMPARISON:  None.  FINDINGS: There is mild elevation the right hemidiaphragm. There is evidence suggesting consolidation in the medial right base. Lungs elsewhere clear. Heart size and pulmonary vascularity are normal. No adenopathy. No bone lesions.  IMPRESSION: Evidence of a degree of consolidation in the medial right base. There is elevation of the right hemidiaphragm. Lungs elsewhere clear. Cardiac silhouette within normal limits.  Followup PA and lateral chest radiographs recommended in 3-4 weeks following trial of antibiotic therapy to ensure resolution and exclude underlying malignancy.   Electronically Signed   By: Lowella Grip III M.D.   On: 07/12/2015 15:52   Ct Angio Chest Pe W/cm &/or Wo Cm  07/14/2015   CLINICAL DATA:  Right chest pain  EXAM: CT ANGIOGRAPHY CHEST WITH CONTRAST  TECHNIQUE: Multidetector CT imaging of the chest was performed using the standard protocol during bolus administration of intravenous contrast. Multiplanar CT image reconstructions and MIPs were obtained to evaluate the vascular anatomy.  CONTRAST:  139mL OMNIPAQUE IOHEXOL 350 MG/ML SOLN  COMPARISON:  Chest x-ray 07/14/2015  FINDINGS: Negative for pulmonary embolism.  Negative for aortic dissection or aneurysm  Moderately large right pleural effusion with compressive atelectasis of the right lower lobe. Atelectasis also present in the right middle lobe. Partial collapse of the right upper lobe medially adjacent to the mediastinum. This extends into the right upper lobe laterally. Small left pleural effusion. No significant infiltrate on the left.  No adenopathy is present in the mediastinum.  No acute bony abnormality  In the upper abdomen, there is mild to moderate ascites. This could be due to liver disease. There is fluid around the liver and spleen. The spleen is not enlarged. Limited imaging of the pancreas and kidneys are grossly normal. There is soft tissue infiltration in the omentum. Carcinoma not excluded.  Review of the MIP images confirms the above findings.  IMPRESSION: Negative for pulmonary embolism  Moderate right pleural effusion with compressive atelectasis in the right lower lobe. Additional atelectasis in the right middle lobe and right upper lobe. No definite mass or pneumonia however infection and tumor are in the differential. There is a small left effusion  Concerning findings in the abdomen. There is ascites around the liver and spleen. There is soft tissue density involving the omentum which can be seen with carcinoma. Further imaging of the abdomen with CT abdomen pelvis with contrast is suggested.   Electronically Signed   By: Franchot Gallo M.D.   On: 07/14/2015 18:28   Ct Abdomen Pelvis W Contrast  07/15/2015   CLINICAL DATA:  39 year old male with ascites and potential omental disease noted on prior chest CT. Follow-up abdominal CT scan.  EXAM: CT ABDOMEN AND PELVIS WITH CONTRAST  TECHNIQUE: Multidetector CT imaging of the abdomen and pelvis was performed using the standard protocol following bolus administration of intravenous contrast.  CONTRAST:  160mL OMNIPAQUE IOHEXOL 300 MG/ML  SOLN  COMPARISON:  CT of the chest 07/14/2015. CT of the pelvis  03/03/2008.  FINDINGS: Lower chest: Small bilateral pleural effusions (left greater than right). Subsegmental atelectasis in the posterior aspect of the right lower lobe.  Hepatobiliary: No cystic or solid hepatic lesion identified. No intra or extrahepatic biliary ductal dilatation. High attenuation material filling the gallbladder, presumably vicarious excretion of contrast material from the recent chest CT.  Pancreas: No  pancreatic mass. No pancreatic ductal dilatation. No pancreatic or peripancreatic fluid or inflammatory changes.  Spleen: Unremarkable.  Adrenals/Urinary Tract: Bilateral adrenal glands and bilateral kidneys are unremarkable in appearance. No hydroureteronephrosis. Urinary bladder is unremarkable in appearance.  Stomach/Bowel: In the mid small bowel (like mid to distal jejunum) there is a profoundly asymmetrically thickened loop of bowel best demonstrated on image 56 of series 2, where the wall measures up to 22 mm in thickness medially. Adjacent to this there are soft tissue masses extending into the root of the small bowel mesentery, largest of which measures 2.7 x 3.3 cm (image 47 of series 2), likely malignant lymph nodes. The appendix is not confidently identified. No pathologic dilatation of small bowel or colon. Stomach is grossly normal in appearance.  Vascular/Lymphatic: No significant atherosclerotic disease, aneurysm or dissection identified in the abdominal or pelvic vasculature. Circumaortic left renal vein (normal anatomical variant) incidentally noted. Probable mesenteric lymphadenopathy, as discussed above.  Reproductive: Prostate gland and seminal vesicles are unremarkable in appearance. Inguinal canals and visualized portions of the scrotum were grossly unremarkable in appearance. No lymphadenopathy noted along the gonadal drainage system.  Other: Large amount of abnormal enhancing soft tissue throughout the peritoneal cavity, most notable in the omentum, particularly in the  right upper quadrant where there is bulky omental caking. Small volume of presumably malignant ascites. No pneumoperitoneum. Some abnormal soft tissue is extending through the anterior abdominal wall into the periumbilical region (image 53 of series 2).  Musculoskeletal: There are no aggressive appearing lytic or blastic lesions noted in the visualized portions of the skeleton.  IMPRESSION: 1. Widespread intraperitoneal malignancy, as demonstrated by extensive peritoneal nodularity and enhancement, widespread omental caking, likely malignant ascites and extensive mesenteric lymphadenopathy. The exact source of malignancy is uncertain, however, given the focally thickened loop of small bowel (likely jejunal) in the left side of the central abdomen (image 56 of series 2), a primary small bowel malignancy is suspected. 2. Small bilateral pleural effusions (left greater than right). Right-sided pleural effusion significantly decreased following thoracentesis. Resolving subsegmental atelectasis in the right lower lobe. 3. Additional incidental findings, as above. These results were called by telephone at the time of interpretation on 07/15/2015 at 5:33 pm to Dr. Olevia Bowens, who verbally acknowledged these results.   Electronically Signed   By: Vinnie Langton M.D.   On: 07/15/2015 17:34   US Biopsy  07/17/2015   CLINICAL DATA:  Extensive peritoneal nodularity and omental caking of uncertain etiology. Periumbilical masses.  EXAM: ULTRASOUND-GUIDED CORE PERIUMBILICAL BIOPSY  TECHNIQUE: An ultrasound guided biopsy was thoroughly discussed with the patient and questions were answered. The benefits, risks, alternatives, and complications were also discussed. The patient understands and wishes to proceed with the procedure. A verbal as well as written consent was obtained.  Survey ultrasound of the periumbilical region was performed and an appropriate skin entry site was determined. Skin site was marked, prepped with  chlorhexidine, and draped in usual sterile fashion, and infiltrated locally with 1% lidocaine.  Intravenous Fentanyl and Versed were administered as conscious sedation during continuous cardiorespiratory monitoring by the radiology RN, with a total moderate sedation time of less than 30 minutes.  Under real-time ultrasound guidance, a 17 gauge trocar needle was advanced to the margin of the lesion. Once needle tip position was confirmed, coaxial 18-gauge core biopsy samples were obtained, submitted in saline to surgical pathology. The guide needle was removed. Postprocedure scans show no hemorrhage or other apparent complication.  COMPLICATIONS: COMPLICATIONS None immediate  FINDINGS: Nodular periumbilical subcutaneous masses were localized. Core biopsy samples were obtained without complication.  IMPRESSION: 1. Technically successful ultrasound guided core periumbilical nodule biopsy.   Electronically Signed   By: Lucrezia Europe M.D.   On: 07/17/2015 16:15   US Paracentesis  07/17/2015   CLINICAL DATA:  Omental disease and peritoneal nodules.  EXAM: ULTRASOUND GUIDED PARACENTESIS  TECHNIQUE: The procedure, risks (including but not limited to bleeding, infection, organ damage ), benefits, and alternatives were explained to the patient. Questions regarding the procedure were encouraged and answered. The patient understands and consents to the procedure. Survey ultrasound of the abdomen was performed and an appropriate skin entry site in the right lower abdomen was selected. Skin site was marked, prepped with Betadine, and draped in usual sterile fashion, and infiltrated locally with 1% lidocaine. A 5 French multisidehole Yueh sheath needle was advanced into the peritoneal space until fluid could be aspirated. The sheath was advanced and the needle removed. 1.2 L of cloudy yellow ascites were aspirated. A sample sent to cytology for the requested studies.  COMPLICATIONS: COMPLICATIONS none  IMPRESSION: Technically  successful ultrasound guided paracentesis, removing 1.2 L of ascites. Sample to cytology.   Electronically Signed   By: Lucrezia Europe M.D.   On: 07/17/2015 16:17   Dg Chest Port 1 View  07/22/2015   CLINICAL DATA:  Pulmonary edema, hypoxia  EXAM: PORTABLE CHEST - 1 VIEW  COMPARISON:  07/21/2015  FINDINGS: Heart size is normal. Right-sided PICC line in place with tip over the cavoatrial junction. Hazy perihilar rate greater than left basilar airspace opacities are slightly decreased. Trace pleural effusions.  IMPRESSION: Interval slight decrease in hazy perihilar airspace opacities compatible with improving edema.   Electronically Signed   By: Conchita Paris M.D.   On: 07/22/2015 08:10   Dg Chest Port 1 View  07/21/2015   CLINICAL DATA:  Acute respiratory failure with hypoxemia. Nonsmoker.  EXAM: PORTABLE CHEST - 1 VIEW  COMPARISON:  07/20/2015.  FINDINGS: Cardiomediastinal silhouette appears within normal limits for size. Diffuse pulmonary opacities persist but are slightly improved consistent with resolving edema. Decreased BILATERAL pleural effusions. PICC line tip SVC. No pneumothorax.  IMPRESSION: Improving pulmonary edema.   Electronically Signed   By: Staci Righter M.D.   On: 07/21/2015 09:38   Dg Chest Port 1 View  07/20/2015   CLINICAL DATA:  Shortness of Breath  EXAM: PORTABLE CHEST - 1 VIEW  COMPARISON:  July 15, 2015  FINDINGS: There is alveolar edema throughout both lungs. There are small bilateral effusions. Heart is upper normal in size. There is mild pulmonary venous hypertension. No adenopathy.  IMPRESSION: Extensive edema. Question a degree of congestive heart failure versus noncardiogenic edema. Note that cardiac silhouette is unchanged compared to recent prior study. No adenopathy apparent.   Electronically Signed   By: Lowella Grip III M.D.   On: 07/20/2015 15:36    ASSESSMENT & PLAN:   Patient is a 39 yo male admitted with   1) Likely high grade B-cell lymphoma (based on  limited data from previous flow and biopsy- discussed with Dr Monica Martinez) with spontaneous TLS. ? EBV related given h/o bothersome infection in 2013) Biopsy was not sufficient to get to a definitive diagnosis and so surgery was consulted and patient had a laparoscopic excision biopsy of peritoneal nodules and nodules on a part of the falciform ligament (07/20/2015) 2) Post-operative hypoxic respiratory failure with CXR showing b/l infiltrates ?Fluid overload vs ARDS from tumor lysis syndrome triggered  by anesthesia/tissue handing. TLS labs stable. Likely fluid overload. Off IVF and improved with some diuresis. 2) Spontaneous TLS - uric acid normalized with allopurinol. Some bump in LFts now improved. Restarted on lower dose of Allopurinol. 3) AKI ?related to contrast/urate nephropathy less likely urinary obstruction. Creatinine has now improved from 1.54 to 1.1 with IVF. 4) Elevated transaminases - ? Related to allopurinol. Improved today. Uric acid normal normal 5) Elevated free T4 - ? Related to his supplements vs ?thyroiditis vs central hyperthyroidism vs thyroid involvement by same neoplastic-cn process vs paraneoplastic process. Plan -PET/CT tomorrow AM -awaiting final tissue diagnosis -increased dexamethasone to 4mg  po TID -replacement of potassium per hospitalist. -continue current dose of allopurinol -f/u thyroid labs  Will continue to follow Appreciate cares by surgery, PCCM and the hospitalist team. Kindly call if questions.   Sullivan Lone MD [place] AAHIVMS [facility] Hematology/Oncology Physician [facility]  (Office):       [phone] (Work cell):  [phone] (Fax):           [phone]  07/22/2015 10:17 PM

## 2015-07-22 NOTE — Progress Notes (Signed)
Patient received from ICU, stable in bed with no complaints. Will continue to monitor.

## 2015-07-22 NOTE — Progress Notes (Signed)
PCCM PROGRESS NOTE  ADMISSION DATE: 07/14/2015 CONSULT DATE: 07/20/2015 REFERRING PROVIDER: [facility]  CC: Hypoxia  SUBJECTIVE: Breathing better.  Denies chest pain.  Not on as much oxygen.  OBJECTIVE: Temp:  [98 F (36.7 C)-98.6 F (37 C)] 98.2 F (36.8 C) (09/11 0800) Pulse Rate:  [72] 72 (09/10 1756) Resp:  [22] 22 (09/10 1756) BP: (126-144)/(69-76) 129/69 mmHg (09/11 0100) SpO2:  [91 %] 91 % (09/10 1756)   General: pleasant HEENT: pupils reactive Cardiac: regular, no murmur Chest: no wheeze Abd: soft Ext: no edema Neuro: normal strength Skin: no rashes   CMP Latest Ref Rng 07/22/2015 07/21/2015 07/21/2015  Glucose 65 - 99 mg/dL 159(H) 146(H) 141(H)  BUN 6 - 20 mg/dL 18 20 24(H)  Creatinine 0.61 - 1.24 mg/dL 0.87 0.97 1.18  Sodium 135 - 145 mmol/L 138 138 136  Potassium 3.5 - 5.1 mmol/L 3.3(L) 3.7 4.2  Chloride 101 - 111 mmol/L 100(L) 98(L) 99(L)  CO2 22 - 32 mmol/L 30 31 25   Calcium 8.9 - 10.3 mg/dL 7.7(L) 7.9(L) 7.5(L)  Total Protein 6.5 - 8.1 g/dL - 5.5(L) 5.1(L)  Total Bilirubin 0.3 - 1.2 mg/dL - 0.6 0.6  Alkaline Phos 38 - 126 U/L - 40 35(L)  AST 15 - 41 U/L - 76(H) 63(H)  ALT 17 - 63 U/L - 61 53     CBC Latest Ref Rng 07/20/2015 07/20/2015 07/19/2015  WBC 4.0 - 10.5 K/uL 9.8 9.4 8.5  Hemoglobin 13.0 - 17.0 g/dL 14.1 14.7 16.0  Hematocrit 39.0 - 52.0 % 41.9 43.1 46.7  Platelets 150 - 400 K/uL 231 229 210     Dg Chest Port 1 View  07/22/2015   CLINICAL DATA:  Pulmonary edema, hypoxia  EXAM: PORTABLE CHEST - 1 VIEW  COMPARISON:  07/21/2015  FINDINGS: Heart size is normal. Right-sided PICC line in place with tip over the cavoatrial junction. Hazy perihilar rate greater than left basilar airspace opacities are slightly decreased. Trace pleural effusions.  IMPRESSION: Interval slight decrease in hazy perihilar airspace opacities compatible with improving edema.   Electronically Signed   By: Conchita Paris M.D.   On: 07/22/2015 08:10   Dg Chest Port 1 View  07/21/2015    CLINICAL DATA:  Acute respiratory failure with hypoxemia. Nonsmoker.  EXAM: PORTABLE CHEST - 1 VIEW  COMPARISON:  07/20/2015.  FINDINGS: Cardiomediastinal silhouette appears within normal limits for size. Diffuse pulmonary opacities persist but are slightly improved consistent with resolving edema. Decreased BILATERAL pleural effusions. PICC line tip SVC. No pneumothorax.  IMPRESSION: Improving pulmonary edema.   Electronically Signed   By: Staci Righter M.D.   On: 07/21/2015 09:38   Dg Chest Port 1 View  07/20/2015   CLINICAL DATA:  Shortness of Breath  EXAM: PORTABLE CHEST - 1 VIEW  COMPARISON:  July 15, 2015  FINDINGS: There is alveolar edema throughout both lungs. There are small bilateral effusions. Heart is upper normal in size. There is mild pulmonary venous hypertension. No adenopathy.  IMPRESSION: Extensive edema. Question a degree of congestive heart failure versus noncardiogenic edema. Note that cardiac silhouette is unchanged compared to recent prior study. No adenopathy apparent.   Electronically Signed   By: Lowella Grip III M.D.   On: 07/20/2015 15:36   STUDIES: 9/03 CT chest/abd/pelvis >> mod Rt effusion, mod ascites, soft tissue density involving omentum 9/04 Rt thoracentesis >> 1500 ml fluid, findings consistent with B cell lymphoproliferative process 9/06 Echo >> EF 55 to 60% 9/37 IR bx periumbilical mass >>  9/06 Paracentesis >> findings consistent with B cell lymphoproliferative process  EVENTS: 9/03 Admit 9/05 Oncology consulted 9/09 Laparoscopic bx of omentum; post-op hypoxia 9/11 To floor bed  DISCUSSION: 39 yo male with back pain/chest pain and found to have omental caking and Rt pleural effusion most likely from High grade B cell Lymphoma.  He developed hypoxia after laparoscopic bx 9/09.  ASSESSMENT/PLAN:  Acute hypoxic respiratory failure 2nd to pulmonary infiltrates >> most likely pulmonary edema ?from negative pressure after surgery >> improving. Plan: -  lasix per primary team - f/u CXR as needed - oxygen to keep SpO2 > 92%  Omental mass, ascites, Rt pleural effusion most likely from high grade B cell lymphoma. AKI with concern for tumor lysis syndrome. Plan: - per primary team and oncology - f/u BMET  Agree with plan for transfer to floor bed.  Defer further management to [facility] and oncology.  PCCM will sign off.   Chesley Mires, MD [facility] Pulmonary/Critical Care 07/22/2015, 12:29 PM Pager:  [phone] After 3pm call: [phone]

## 2015-07-23 ENCOUNTER — Inpatient Hospital Stay (HOSPITAL_COMMUNITY): Payer: BLUE CROSS/BLUE SHIELD

## 2015-07-23 ENCOUNTER — Encounter (HOSPITAL_COMMUNITY): Payer: Self-pay | Admitting: General Surgery

## 2015-07-23 DIAGNOSIS — R140 Abdominal distension (gaseous): Secondary | ICD-10-CM

## 2015-07-23 LAB — T3, FREE: T3 FREE: 1.6 pg/mL — AB (ref 2.0–4.4)

## 2015-07-23 LAB — T4: T4, Total: 9.5 ug/dL (ref 4.5–12.0)

## 2015-07-23 LAB — GLUCOSE, CAPILLARY: GLUCOSE-CAPILLARY: 88 mg/dL (ref 65–99)

## 2015-07-23 LAB — BASIC METABOLIC PANEL
Anion gap: 8 (ref 5–15)
BUN: 20 mg/dL (ref 6–20)
CHLORIDE: 101 mmol/L (ref 101–111)
CO2: 29 mmol/L (ref 22–32)
CREATININE: 0.92 mg/dL (ref 0.61–1.24)
Calcium: 7.8 mg/dL — ABNORMAL LOW (ref 8.9–10.3)
GFR calc Af Amer: >60 mL/min (ref >60)
GFR calc non Af Amer: >60 mL/min (ref >60)
Glucose, Bld: 109 mg/dL — ABNORMAL HIGH (ref 65–99)
Potassium: 4 mmol/L (ref 3.5–5.1)
SODIUM: 138 mmol/L (ref 135–145)

## 2015-07-23 LAB — LACTATE DEHYDROGENASE: LDH: 1029 U/L — ABNORMAL HIGH (ref 98–192)

## 2015-07-23 LAB — THYROGLOBULIN ANTIBODY

## 2015-07-23 LAB — T3: T3 TOTAL: 54 ng/dL — AB (ref 71–180)

## 2015-07-23 LAB — URIC ACID: URIC ACID, SERUM: 4.9 mg/dL (ref 4.4–7.6)

## 2015-07-23 MED ORDER — FUROSEMIDE 10 MG/ML IJ SOLN
20.0000 mg | Freq: Once | INTRAMUSCULAR | Status: AC
  Administered 2015-07-23: 20 mg via INTRAVENOUS
  Filled 2015-07-23: qty 2

## 2015-07-23 MED ORDER — POLYETHYLENE GLYCOL 3350 17 G PO PACK
17.0000 g | PACK | Freq: Every day | ORAL | Status: DC
  Administered 2015-07-23 – 2015-07-24 (×2): 17 g via ORAL
  Filled 2015-07-23 (×2): qty 1

## 2015-07-23 MED ORDER — POTASSIUM CHLORIDE CRYS ER 20 MEQ PO TBCR
20.0000 meq | EXTENDED_RELEASE_TABLET | Freq: Once | ORAL | Status: AC
  Administered 2015-07-23: 20 meq via ORAL
  Filled 2015-07-23: qty 1

## 2015-07-23 MED ORDER — FLUDEOXYGLUCOSE F - 18 (FDG) INJECTION
10.0000 | Freq: Once | INTRAVENOUS | Status: DC | PRN
  Administered 2015-07-23: 10 via INTRAVENOUS
  Filled 2015-07-23: qty 10

## 2015-07-23 NOTE — Plan of Care (Signed)
Problem: Phase II Progression Outcomes Goal: Progress activity as tolerated unless otherwise ordered Outcome: Completed/Met Date Met:  07/23/15 Ambulated, took a shower 9/11

## 2015-07-23 NOTE — Progress Notes (Signed)
[name]   HEMATOLOGY/ONCOLOGY INPATIENT PROGRESS NOTE  Date of Service: 07/23/2015  Inpatient Attending: .Elmarie Shiley, MD   SUBJECTIVE  Patient seen this afternoon. He notes he is feeling a little better. Had his PET/CT scan today which I discussed with him in details including going over his PET/CT scan images. I discussed his biopsy results with Dr Monica Martinez (Pathology) - consistent with High grade B-cell lymphoma.. He strongly believe Burkitts Lymphoma is at the top of the differential diagnosis and final pathology report will be available tomorrow. Patient has been scheduled for a Bone Marrow aspiration and Biopsy and Spinal tap tomorrow for completion of staging. I discussed this with the radiologist.  OBJECTIVE:  PHYSICAL EXAMINATION: . Filed Vitals:   07/22/15 1300 07/23/15 0623 07/23/15 1436 07/23/15 1635  BP: 131/77 118/65 132/68   Pulse:  60 65   Temp: 97.8 F (36.6 C) 97.6 F (36.4 C) 98.4 F (36.9 C)   TempSrc: Oral Oral Oral   Resp: 60 20 20   Height:      Weight:    200 lb (90.719 kg)  SpO2: 88% 94% 94%    Filed Weights   07/19/15 1700 07/22/15 1100 07/23/15 1635  Weight: 208 lb (94.348 kg) 202 lb 9.6 oz (91.9 kg) 200 lb (90.719 kg)   .Body mass index is 27.91 kg/(m^2).  GENERAL: SKIN:no acute rashes. EYES: normal, conjunctiva are pink and non-injected, sclera clear OROPHARYNX:no exudate, no erythema and lips, buccal mucosa, and tongue normal  NECK: supple, no JVD, thyroid normal size, non-tender, without nodularity LYMPH:  no palpable lymphadenopathy in the cervical, axillary or inguinal LUNGS:Decreased breath sounds rt base. No overt rales noted. HEART: regular rate & rhythm,  no murmurs and trace lower extremity edema ABDOMEN: abdomen mildly distended, BS normoactive, laparoscopic incisions clean. PSYCH: alert & oriented x 3 with fluent speech NEURO: no focal motor/sensory deficits  MEDICAL HISTORY:  Past Medical History  Diagnosis Date  . EBV  infection 2013    Patient notes that he had an EBV infection in 2013 that left him with chronic fatigue. She also notes that he had significant lymphadenopathy and thyroiditis at the time. He has been managing his symptoms with an alternative medicine practitioner in [place] and with other alternative medicines.  . Marijuana use, episodic     SURGICAL HISTORY: Past Surgical History  Procedure Laterality Date  . Tonsillectomy    . Appendectomy    . Laparoscopy N/A 07/20/2015    Procedure: LAPAROSCOPY DIAGNOSTIC, MULTIPLE BIOPSIES, DRAINAGE OF ASCITES;  Surgeon: Fanny Skates, MD;  Location: [facility];  Service: General;  Laterality: N/A;    SOCIAL HISTORY: Social History   Social History  . Marital Status: Married    Spouse Name: N/A  . Number of Children: N/A  . Years of Education: N/A   Occupational History  . Not on file.   Social History Main Topics  . Smoking status: Never Smoker   . Smokeless tobacco: Not on file  . Alcohol Use: No  . Drug Use: 1.00 per week    Special: Marijuana  . Sexual Activity: Not on file   Other Topics Concern  . 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   He has a fiance and has been in a steady relationship.      He has tried to maintain a very healthy lifestyle and cycles about 100 miles a week.  He also uses a fair number of over-the-counter alternative medicines to stay healthy.    FAMILY HISTORY: Family History  Problem Relation Age of Onset  . Heart failure Father     ALLERGIES:  is allergic to fluoride preparations and sulfa antibiotics.  MEDICATIONS:  Scheduled Meds: . allopurinol  150 mg Oral BID  . dexamethasone  4 mg Intravenous TID PC  . enoxaparin (LOVENOX) injection  40 mg Subcutaneous Q24H  . pantoprazole  40 mg Oral BID AC  . polyethylene glycol  17 g Oral Daily  . senna-docusate  2 tablet Oral QHS  . sodium chloride   10-40 mL Intracatheter Q12H   Continuous Infusions: . sodium chloride 10 mL/hr at 07/15/15 1023   PRN Meds:.fludeoxyglucose F - 18, gi cocktail, HYDROcodone-acetaminophen, LORazepam, methocarbamol, morphine injection, ondansetron (ZOFRAN) IV, sodium chloride  REVIEW OF SYSTEMS:    10 Point review of Systems was done is negative except as noted above.   LABORATORY DATA:  I have reviewed the data as listed  . CBC Latest Ref Rng 07/20/2015 07/20/2015 07/19/2015  WBC 4.0 - 10.5 K/uL 9.8 9.4 8.5  Hemoglobin 13.0 - 17.0 g/dL 14.1 14.7 16.0  Hematocrit 39.0 - 52.0 % 41.9 43.1 46.7  Platelets 150 - 400 K/uL 231 229 210   . CBC    Component Value Date/Time   WBC 9.8 07/20/2015 1646   RBC 4.82 07/20/2015 1646   RBC 5.39 07/18/2015 0920   HGB 14.1 07/20/2015 1646   HCT 41.9 07/20/2015 1646   PLT 231 07/20/2015 1646   MCV 86.9 07/20/2015 1646   MCH 29.3 07/20/2015 1646   MCHC 33.7 07/20/2015 1646   RDW 12.8 07/20/2015 1646   LYMPHSABS 1.1 07/20/2015 1646   MONOABS 0.0* 07/20/2015 1646   EOSABS 0.0 07/20/2015 1646   BASOSABS 0.0 07/20/2015 1646      . CMP Latest Ref Rng 07/23/2015 07/22/2015 07/21/2015  Glucose 65 - 99 mg/dL 109(H) 159(H) 146(H)  BUN 6 - 20 mg/dL _0 Creatinine 0.61 - 1.24 mg/dL 0.92 0.87 0.97  Sodium 135 - 145 mmol/L 138 138 138  Potassium 3.5 - 5.1 mmol/L 4.0 3.3(L) 3.7  Chloride 101 - 111 mmol/L 101 100(L) 98(L)  CO2 22 - 32 mmol/L _1 Calcium 8.9 - 10.3 mg/dL 7.8(L) 7.7(L) 7.9(L)  Total Protein 6.5 - 8.1 g/dL - - 5.5(L)  Total Bilirubin 0.3 - 1.2 mg/dL - - 0.6  Alkaline Phos 38 - 126 U/L - - 40  AST 15 - 41 U/L - - 76(H)  ALT 17 - 63 U/L - - 61     RADIOGRAPHIC STUDIES: I have personally reviewed the radiological images as listed and agreed with the findings in the report. Dg Chest 2 View  07/15/2015   CLINICAL DATA:  Followup right pleural effusion, status post right thoracentesis.  EXAM: CHEST  2 VIEW  COMPARISON:  Yesterday.  FINDINGS:  Stable right upper companion rib shadows. No pneumothorax seen. Significantly decreased right pleural fluid. The interstitial markings remain mildly prominent. Normal sized heart. Interval mild left lower lobe linear density. Unremarkable bones.  IMPRESSION: 1. No pneumothorax following right thoracentesis. 2. Significantly less right pleural fluid. 3. Interval mild left lower lobe atelectasis. 4. Stable mild chronic interstitial lung disease.   Electronically Signed   By: Claudie Revering M.D.   On: 07/15/2015 17:21   Dg Chest 2 View  07/14/2015   CLINICAL DATA:  Right-sided chest pain for 2-3 days. Shortness of breath.  EXAM: CHEST  2 VIEW  COMPARISON:  07/12/2015  FINDINGS: Heart size is probably normal. Suspect lung opacity at the right base in addition to the elevated hemidiaphragm. Opacity at the right lung base may be related to subpulmonic effusion. Overall the appearance is stable since previous exam. There is a trace left pleural effusion. No pulmonary edema.  IMPRESSION: Persistent significant opacity at the right lung base. Consider right lateral decubitus view to determine component of layering pleural effusion.   Electronically Signed   By: Nolon Nations M.D.   On: 07/14/2015 16:56   Dg Chest 2 View  07/12/2015   CLINICAL DATA:  Anterior chest pain for 1 week with cough  EXAM: CHEST  2 VIEW  COMPARISON:  None.  FINDINGS: There is mild elevation the right hemidiaphragm. There is evidence suggesting consolidation in the medial right base. Lungs elsewhere clear. Heart size and pulmonary vascularity are normal. No adenopathy. No bone lesions.  IMPRESSION: Evidence of a degree of consolidation in the medial right base. There is elevation of the right hemidiaphragm. Lungs elsewhere clear. Cardiac silhouette within normal limits.  Followup PA and lateral chest radiographs recommended in 3-4 weeks following trial of antibiotic therapy to ensure resolution and exclude underlying malignancy.   Electronically  Signed   By: Lowella Grip III M.D.   On: 07/12/2015 15:52   Ct Angio Chest Pe W/cm &/or Wo Cm  07/14/2015   CLINICAL DATA:  Right chest pain  EXAM: CT ANGIOGRAPHY CHEST WITH CONTRAST  TECHNIQUE: Multidetector CT imaging of the chest was performed using the standard protocol during bolus administration of intravenous contrast. Multiplanar CT image reconstructions and MIPs were obtained to evaluate the vascular anatomy.  CONTRAST:  192m OMNIPAQUE IOHEXOL 350 MG/ML SOLN  COMPARISON:  Chest x-ray 07/14/2015  FINDINGS: Negative for pulmonary embolism. Negative for aortic dissection or aneurysm  Moderately large right pleural effusion with compressive atelectasis of the right lower lobe. Atelectasis also present in the right middle lobe. Partial collapse of the right upper lobe medially adjacent to the mediastinum. This extends into the right upper lobe laterally. Small left pleural effusion. No significant infiltrate on the left.  No adenopathy is present in the mediastinum.  No acute bony abnormality  In the upper abdomen, there is mild to moderate ascites. This could be due to liver disease. There is fluid around the liver and spleen. The spleen is not enlarged. Limited imaging of the pancreas and kidneys are grossly normal. There is soft tissue infiltration in the omentum. Carcinoma not excluded.  Review of the MIP images confirms the above findings.  IMPRESSION: Negative for pulmonary embolism  Moderate right pleural effusion with compressive atelectasis in the right lower lobe. Additional atelectasis in the right middle lobe and right upper lobe. No definite mass or pneumonia however infection and tumor are in the differential. There is a small left effusion  Concerning findings in the abdomen. There is ascites around the liver and spleen. There is soft tissue density involving the omentum which can be seen with carcinoma. Further imaging of the abdomen with CT abdomen pelvis with contrast is suggested.    Electronically Signed   By: CFranchot GalloM.D.   On: 07/14/2015 18:28   Ct Abdomen Pelvis W Contrast  07/15/2015   CLINICAL DATA:  39year old male with ascites and potential omental disease noted on prior chest CT. Follow-up abdominal CT scan.  EXAM: CT ABDOMEN AND PELVIS WITH CONTRAST  TECHNIQUE: Multidetector CT imaging of the abdomen and  pelvis was performed using the standard protocol following bolus administration of intravenous contrast.  CONTRAST:  152m OMNIPAQUE IOHEXOL 300 MG/ML  SOLN  COMPARISON:  CT of the chest 07/14/2015. CT of the pelvis 03/03/2008.  FINDINGS: Lower chest: Small bilateral pleural effusions (left greater than right). Subsegmental atelectasis in the posterior aspect of the right lower lobe.  Hepatobiliary: No cystic or solid hepatic lesion identified. No intra or extrahepatic biliary ductal dilatation. High attenuation material filling the gallbladder, presumably vicarious excretion of contrast material from the recent chest CT.  Pancreas: No pancreatic mass. No pancreatic ductal dilatation. No pancreatic or peripancreatic fluid or inflammatory changes.  Spleen: Unremarkable.  Adrenals/Urinary Tract: Bilateral adrenal glands and bilateral kidneys are unremarkable in appearance. No hydroureteronephrosis. Urinary bladder is unremarkable in appearance.  Stomach/Bowel: In the mid small bowel (like mid to distal jejunum) there is a profoundly asymmetrically thickened loop of bowel best demonstrated on image 56 of series 2, where the wall measures up to 22 mm in thickness medially. Adjacent to this there are soft tissue masses extending into the root of the small bowel mesentery, largest of which measures 2.7 x 3.3 cm (image 47 of series 2), likely malignant lymph nodes. The appendix is not confidently identified. No pathologic dilatation of small bowel or colon. Stomach is grossly normal in appearance.  Vascular/Lymphatic: No significant atherosclerotic disease, aneurysm or dissection  identified in the abdominal or pelvic vasculature. Circumaortic left renal vein (normal anatomical variant) incidentally noted. Probable mesenteric lymphadenopathy, as discussed above.  Reproductive: Prostate gland and seminal vesicles are unremarkable in appearance. Inguinal canals and visualized portions of the scrotum were grossly unremarkable in appearance. No lymphadenopathy noted along the gonadal drainage system.  Other: Large amount of abnormal enhancing soft tissue throughout the peritoneal cavity, most notable in the omentum, particularly in the right upper quadrant where there is bulky omental caking. Small volume of presumably malignant ascites. No pneumoperitoneum. Some abnormal soft tissue is extending through the anterior abdominal wall into the periumbilical region (image 53 of series 2).  Musculoskeletal: There are no aggressive appearing lytic or blastic lesions noted in the visualized portions of the skeleton.  IMPRESSION: 1. Widespread intraperitoneal malignancy, as demonstrated by extensive peritoneal nodularity and enhancement, widespread omental caking, likely malignant ascites and extensive mesenteric lymphadenopathy. The exact source of malignancy is uncertain, however, given the focally thickened loop of small bowel (likely jejunal) in the left side of the central abdomen (image 56 of series 2), a primary small bowel malignancy is suspected. 2. Small bilateral pleural effusions (left greater than right). Right-sided pleural effusion significantly decreased following thoracentesis. Resolving subsegmental atelectasis in the right lower lobe. 3. Additional incidental findings, as above. These results were called by telephone at the time of interpretation on 07/15/2015 at 5:33 pm to Dr. OOlevia Bowens who verbally acknowledged these results.   Electronically Signed   By: DVinnie LangtonM.D.   On: 07/15/2015 17:34   Nm Pet Image Initial (pi) Skull Base To Thigh  07/23/2015   CLINICAL DATA:  Initial  treatment strategy for high-grade non-Hodgkin's lymphoma. B-cell lymphoma.  EXAM: NUCLEAR MEDICINE PET SKULL BASE TO THIGH  TECHNIQUE: 10.0 mCi F-18 FDG was injected intravenously. Full-ring PET imaging was performed from the skull base to thigh after the radiotracer. CT data was obtained and used for attenuation correction and anatomic localization.  FASTING BLOOD GLUCOSE:  Value: 88 mg/dl  COMPARISON:  CT abdomen 07/15/2015, CT thorax 07/14/2015  FINDINGS: NECK  Small hypermetabolic lymph nodes the just deep  to the RIGHT clavicle measure 9 mm short axis on image 54, series 4 with intense metabolic activity (SUV max 6.2).  CHEST  Small hypermetabolic internal mammary lymph nodes on the RIGHT (image 66 with SUV max equal 5.). Small hypermetabolic nodule posterior to the sternum in the anterior mediastinum on image 69 of fused data set.  Within the precordial space anterior to the liver there is a 4.5 cm hypermetabolic mass. Smaller hypermetabolic nodule anterior to the RIGHT ventricle.  ABDOMEN/PELVIS  There is a thin rim of intense metabolic activity associated entirety of the peritoneal surface consists with peritoneal metastasis / carcinomatosis. There is more focal activity within the LEFT abdomen associated with the small bowel wall thickening. This segmental thickening measures 17 mm in single wall thickness with intense metabolic activity (SUV max 14.6). Central mesenteric mass measures 3 cm with SUV max 13.2.  There are no hypermetabolic inguinal or iliac lymph nodes.  SKELETON  No focal hypermetabolic activity to suggest skeletal metastasis.  IMPRESSION: 1. Thin rim of hypermetabolic activity throughout the peritoneal space consistent with peritoneal carcinomatosis. 2. Focal intense metabolic activity associated with the proximal small bowel consistent with lymphoma involvement of bowel. 3. Hypermetabolic mesenteric nodule  /node consistent with lymphoma. 4. Several small hypermetabolic lymph nodes in the  RIGHT supraclavicular and internal mammary nodal stations. 5. Nodal metastasis anterior to the liver in the precordial space. This may be the most assessable for biopsy. 6. Normal spleen and bone marrow.   Electronically Signed   By: Suzy Bouchard M.D.   On: 07/23/2015 11:35   US Biopsy  07/17/2015   CLINICAL DATA:  Extensive peritoneal nodularity and omental caking of uncertain etiology. Periumbilical masses.  EXAM: ULTRASOUND-GUIDED CORE PERIUMBILICAL BIOPSY  TECHNIQUE: An ultrasound guided biopsy was thoroughly discussed with the patient and questions were answered. The benefits, risks, alternatives, and complications were also discussed. The patient understands and wishes to proceed with the procedure. A verbal as well as written consent was obtained.  Survey ultrasound of the periumbilical region was performed and an appropriate skin entry site was determined. Skin site was marked, prepped with chlorhexidine, and draped in usual sterile fashion, and infiltrated locally with 1% lidocaine.  Intravenous Fentanyl and Versed were administered as conscious sedation during continuous cardiorespiratory monitoring by the radiology RN, with a total moderate sedation time of less than 30 minutes.  Under real-time ultrasound guidance, a 17 gauge trocar needle was advanced to the margin of the lesion. Once needle tip position was confirmed, coaxial 18-gauge core biopsy samples were obtained, submitted in saline to surgical pathology. The guide needle was removed. Postprocedure scans show no hemorrhage or other apparent complication.  COMPLICATIONS: COMPLICATIONS None immediate  FINDINGS: Nodular periumbilical subcutaneous masses were localized. Core biopsy samples were obtained without complication.  IMPRESSION: 1. Technically successful ultrasound guided core periumbilical nodule biopsy.   Electronically Signed   By: Lucrezia Europe M.D.   On: 07/17/2015 16:15   US Paracentesis  07/17/2015   CLINICAL DATA:  Omental  disease and peritoneal nodules.  EXAM: ULTRASOUND GUIDED PARACENTESIS  TECHNIQUE: The procedure, risks (including but not limited to bleeding, infection, organ damage ), benefits, and alternatives were explained to the patient. Questions regarding the procedure were encouraged and answered. The patient understands and consents to the procedure. Survey ultrasound of the abdomen was performed and an appropriate skin entry site in the right lower abdomen was selected. Skin site was marked, prepped with Betadine, and draped in usual sterile fashion, and infiltrated  locally with 1% lidocaine. A 5 French multisidehole Yueh sheath needle was advanced into the peritoneal space until fluid could be aspirated. The sheath was advanced and the needle removed. 1.2 L of cloudy yellow ascites were aspirated. A sample sent to cytology for the requested studies.  COMPLICATIONS: COMPLICATIONS none  IMPRESSION: Technically successful ultrasound guided paracentesis, removing 1.2 L of ascites. Sample to cytology.   Electronically Signed   By: Lucrezia Europe M.D.   On: 07/17/2015 16:17   Dg Chest Port 1 View  07/22/2015   CLINICAL DATA:  Pulmonary edema, hypoxia  EXAM: PORTABLE CHEST - 1 VIEW  COMPARISON:  07/21/2015  FINDINGS: Heart size is normal. Right-sided PICC line in place with tip over the cavoatrial junction. Hazy perihilar rate greater than left basilar airspace opacities are slightly decreased. Trace pleural effusions.  IMPRESSION: Interval slight decrease in hazy perihilar airspace opacities compatible with improving edema.   Electronically Signed   By: Conchita Paris M.D.   On: 07/22/2015 08:10   Dg Chest Port 1 View  07/21/2015   CLINICAL DATA:  Acute respiratory failure with hypoxemia. Nonsmoker.  EXAM: PORTABLE CHEST - 1 VIEW  COMPARISON:  07/20/2015.  FINDINGS: Cardiomediastinal silhouette appears within normal limits for size. Diffuse pulmonary opacities persist but are slightly improved consistent with resolving  edema. Decreased BILATERAL pleural effusions. PICC line tip SVC. No pneumothorax.  IMPRESSION: Improving pulmonary edema.   Electronically Signed   By: Staci Righter M.D.   On: 07/21/2015 09:38   Dg Chest Port 1 View  07/20/2015   CLINICAL DATA:  Shortness of Breath  EXAM: PORTABLE CHEST - 1 VIEW  COMPARISON:  July 15, 2015  FINDINGS: There is alveolar edema throughout both lungs. There are small bilateral effusions. Heart is upper normal in size. There is mild pulmonary venous hypertension. No adenopathy.  IMPRESSION: Extensive edema. Question a degree of congestive heart failure versus noncardiogenic edema. Note that cardiac silhouette is unchanged compared to recent prior study. No adenopathy apparent.   Electronically Signed   By: Lowella Grip III M.D.   On: 07/20/2015 15:36    ASSESSMENT & PLAN:   Patient is a 39 yo male admitted with   1) High grade B-cell lymphoma (Burkitts vs DLBCL) with spontaneous TLS. ? EBV related given h/o bothersome infection in 2013. PET/CT with significant intra-abdominal involvement, small intestinal involvement, some chest LNadenopathy as noted above. 2) Post-operative hypoxic respiratory failure with CXR showing b/l infiltrates ?Fluid overload - resolved with light diuresis. 2) Spontaneous TLS - uric acid normalized with allopurinol. Some bump in LFts now improved. Restarted on lower dose of Allopurinol. 3) AKI ?related to contrast/urate nephropathy less likely urinary obstruction. Creatinine has now improved from 1.54 to 1.1 to 0.9 4) Elevated transaminases - ? Related to allopurinol. Improved today.  5) Elevated free T4 - ? Related to his supplements vs ?thyroiditis vs central hyperthyroidism vs thyroid involvement by same neoplastic-cn process vs paraneoplastic process. Plan -Bone Marrow biopsy and diagnostic LP for completion of staging workup tommorrow. -awaiting final tissue diagnosis (will likely to available tomorrow - discussed this with Dr  Monica Martinez) -continue dexamethasone to 57m po TID -continue current dose of allopurinol -f/u thyroid labs -will discuss treatment options with patient based on final pathology report.  Will continue to follow Appreciate cares by surgery, PCCM and the hospitalist team. Kindly call if questions.   GSullivan LoneMD MCoyote AcresAAHIVMS [facility] CenterCBoulder Spine Center LLCHematology/Oncology Physician CW[place] (Office):  [phone] (Work cell):  [phone] (Fax):           [phone]  07/23/2015 10:01 PM

## 2015-07-23 NOTE — Progress Notes (Signed)
[facility] PROGRESS NOTE  Donald [name] XFG:[id number] DOB: 1976-06-29 DOA: 07/14/2015 PCP: Drema Pry, DO  brief narrative 39 year old male with no medical history presented with 2 weeks of back pain while riding his bike. He had a chest x-ray done as outpatient which suggested a possible infection and was given antibiotic. However patient returned to the ED as he had right chest pain and was short of breath. In the ED was found to have a large right-sided pleural effusion and was admitted for further workup. Patient transferred to [facility] long for excisional core biopsy.  Assessment/Plan: Pleural effusion, right/abdominal mass suggestive of B cell lymphoma: -CT scan of the chest with contrast: -  significant effusion with compression of the right lung. -CT scan of the abdomen :- peritoneal carcinomatosis with no lymphadenopathy -right  Thoracocentesis suggests exudate. -oncology following. Started on decadron on 9/10 after laparoscopic excisional biopsy on 9/9. PET scan scheduled for 9/12 as inpatient. -PICC line placed on 9/9 -[place] core biopsy of omental mass and diagnostic/therapeutic paracentesis, pending pathology. tissue flow cytometry suggests proliferative B cell.   Postop acute hypoxemic respiratory failure on 9/9 Patient extubated and placed on nonrebreather. Given 10 mg IV Lasix. Lactic acid mildly elevated. Chest x-ray shows extensive pulmonary edema. Discontinued IV fluids. Received 2 doses of IV lasix ( total 40 mg on 9/10). Currently maintaining sats on 2.5 L via nasal cannula.  Continue incentive spirometry. Repeat chest x-ray showed improving pulmonary edema. Received one time dose of lasix 9-11.  -follow weight today, if still elevated will give another dose of lasix.   Acute kidney injury -possibly due to tumor lysis syndrome and IV contrast -Start IV fluids on 9/9 given acute pulmonary edema postop. Renal function back to normal.  Tumor lysis syndrome: -Resumed  allopurinol since LFTs improved. -Renal function improving and uric acid normal. Stopped  IV fluids due to fluid overload.  Hypokalemia Replenished  GERD Added PPI twice a day and Maalox. Symptoms improved.  Elevated TSH and free T4 No clear etiology.? Thyroiditis versus secondary hyper thyroidism. Follow thyroglobulin antibody and stimulating immunoglobulin.  Screening process: -non reactive HIV -CMV IGM and antibody neg -EBV results (DNA by PCR and VCA antibody) negative.  -ESR 6 -CRP 4.3 -hepatitis panel neg -AFP 4.5 -HCG tumor marker < 1     DVT prophylaxis: sq heparin  Diet: Regular   Consultants:  IR Oncology ( Dr Irene Limbo) [facility]  Procedures:  Thoracocentesis  Paracentesis  laproscopy abdominal biopsy  Antibiotics:  None  CODE STATUS: Full code Family communication: None at bedside today.  Disposition: Transferred to medical floor. PET scan on 9/12. Home possibly on 9/13 if breathing  continues to improve and outpatient therapy planned.   HPI/Subjective: He is feeling better, breathing better.  Had BM yesterday,. Abdomen mildly distended.   Objective: Filed Vitals:   07/23/15 0623  BP: 118/65  Pulse: 60  Temp: 97.6 F (36.4 C)  Resp: 20    Intake/Output Summary (Last 24 hours) at 07/23/15 0857 Last data filed at 07/22/15 1700  Gross per 24 hour  Intake    240 ml  Output      0 ml  Net    240 ml   Filed Weights   07/14/15 2306 07/19/15 1700 07/22/15 1100  Weight: 93.8 kg (206 lb 12.7 oz) 94.348 kg (208 lb) 91.9 kg (202 lb 9.6 oz)    Exam:   General:  Middle aged male in no acute distress  Chest: crackles base, more pronounced on  the right.   CVS: Normal S1 and S2, no murmurs  GI: Soft, abdominal distention, nontender, bowel sounds present  Musculoskeletal: Warm, no edema  CNS: Alert and oriented  Data Reviewed: Basic Metabolic Panel:  Recent Labs Lab 07/16/15 1136  07/18/15 0920  07/20/15 0407  07/20/15 1646 07/20/15 1815 07/21/15 0538 07/21/15 1800 07/22/15 0920 07/23/15 0455  NA 138  < > 136  < > 138 141  --  136 138 138 138  K 4.3  < > 4.0  < > 4.2 4.8  --  4.2 3.7 3.3* 4.0  CL 104  < > 95*  < > 102 104  --  99* 98* 100* 101  CO2 20*  < > 31  < > 21* 20*  --  25 31 30 29   GLUCOSE 80  < > 118*  < > 76 111*  --  141* 146* 159* 109*  BUN 13  < > 12  < > 22* 24*  --  24* 20 18 20   CREATININE 1.18  < > 1.33*  < > 1.54* 1.39*  --  1.18 0.97 0.87 0.92  CALCIUM 8.2*  < > 8.2*  < > 7.5* 7.7*  --  7.5* 7.9* 7.7* 7.8*  MG  --   --   --   --  1.8  --   --   --   --   --   --   PHOS 2.7  --  2.1*  --  5.8*  --  6.0*  --  3.7  --   --   < > = values in this interval not displayed. Liver Function Tests:  Recent Labs Lab 07/19/15 1019 07/20/15 0407 07/20/15 1646 07/21/15 0538 07/21/15 1800  AST 110* 78* 76* 63* 76*  ALT 80* 61 59 53 61  ALKPHOS 48 40 43 35* 40  BILITOT 0.9 0.6 1.2 0.6 0.6  PROT 5.0* 4.9* 5.2* 5.1* 5.5*  ALBUMIN 2.7* 2.6* 2.8* 2.7* 2.9*   No results for input(s): LIPASE, AMYLASE in the last 168 hours. No results for input(s): AMMONIA in the last 168 hours. CBC:  Recent Labs Lab 07/17/15 0342 07/18/15 0920 07/19/15 1019 07/20/15 0407 07/20/15 1646  WBC 7.1 6.8 8.5 9.4 9.8  NEUTROABS 4.5 4.2 5.9 5.8 8.6*  HGB 14.4 16.0 16.0 14.7 14.1  HCT 41.6 46.4 46.7 43.1 41.9  MCV 85.1 86.1 85.7 86.4 86.9  PLT 199 200 210 229 231   Cardiac Enzymes:  Recent Labs Lab 07/21/15 0538  TROPONINI <0.03   BNP (last 3 results)  Recent Labs  07/20/15 1646  BNP 13.9    ProBNP (last 3 results) No results for input(s): PROBNP in the last 8760 hours.  CBG: No results for input(s): GLUCAP in the last 168 hours.  Recent Results (from the past 240 hour(s))  AFB culture with smear     Status: None (Preliminary result)   Collection Time: 07/15/15  3:23 PM  Result Value Ref Range Status   Specimen Description PLEURAL RIGHT FLUID  Final   Special Requests  PLEURAL RIGHT FLUID  Final   Acid Fast Smear   Final    NO ACID FAST BACILLI SEEN Performed at [facility]    Culture   Final    CULTURE WILL BE EXAMINED FOR 6 WEEKS BEFORE ISSUING A FINAL REPORT Performed at [facility]    Report Status PENDING  Incomplete  Fungus Culture with Smear     Status: None (Preliminary result)  Collection Time: 07/15/15  3:23 PM  Result Value Ref Range Status   Specimen Description PLEURAL RIGHT FLUID  Final   Special Requests PLEURAL RIGHT FLUID  Final   Fungal Smear   Final    NO YEAST OR FUNGAL ELEMENTS SEEN Performed at [facility]    Culture   Final    CULTURE IN PROGRESS FOR FOUR WEEKS Performed at [facility]    Report Status PENDING  Incomplete  Culture, body fluid-bottle     Status: None   Collection Time: 07/15/15  3:23 PM  Result Value Ref Range Status   Specimen Description FLUID PLEURAL  Final   Special Requests BOTTLES DRAWN AEROBIC AND ANAEROBIC 5CC  Final   Culture NO GROWTH 5 DAYS  Final   Report Status 07/20/2015 FINAL  Final  Gram stain     Status: None   Collection Time: 07/15/15  3:23 PM  Result Value Ref Range Status   Specimen Description FLUID PLEURAL  Final   Special Requests NONE  Final   Gram Stain   Final    ABUNDANT WBC PRESENT,BOTH PMN AND MONONUCLEAR NO ORGANISMS SEEN GRAM STAIN REVIEWED-AGREE WITH RESULT K WOOLEN    Report Status 07/15/2015 FINAL  Final  Surgical pcr screen     Status: None   Collection Time: 07/19/15 10:12 PM  Result Value Ref Range Status   MRSA, PCR NEGATIVE NEGATIVE Final   Staphylococcus aureus NEGATIVE NEGATIVE Final    Comment:        The Xpert SA Assay (FDA approved for NASAL specimens in patients over 26 years of age), is one component of a comprehensive surveillance program.  Test performance has been validated by [facility] for patients greater than or equal to 104 year old. It is not intended to diagnose infection nor to guide or  monitor treatment.      Studies: Dg Chest Port 1 View  07/22/2015   CLINICAL DATA:  Pulmonary edema, hypoxia  EXAM: PORTABLE CHEST - 1 VIEW  COMPARISON:  07/21/2015  FINDINGS: Heart size is normal. Right-sided PICC line in place with tip over the cavoatrial junction. Hazy perihilar rate greater than left basilar airspace opacities are slightly decreased. Trace pleural effusions.  IMPRESSION: Interval slight decrease in hazy perihilar airspace opacities compatible with improving edema.   Electronically Signed   By: Conchita Paris M.D.   On: 07/22/2015 08:10    Scheduled Meds: . allopurinol  150 mg Oral BID  . dexamethasone  4 mg Intravenous TID PC  . enoxaparin (LOVENOX) injection  40 mg Subcutaneous Q24H  . pantoprazole  40 mg Oral BID AC  . senna-docusate  2 tablet Oral QHS  . sodium chloride  10-40 mL Intracatheter Q12H   Continuous Infusions: . sodium chloride 10 mL/hr at 07/15/15 1023     Time spent: 25 minutes    Shirlyn Savin, [facility] Pager [phone] If 7PM-7AM, please contact night-coverage at [email or web address], password [facility] 07/23/2015, 8:57 AM  LOS: 9 days

## 2015-07-23 NOTE — Progress Notes (Signed)
3 Days Post-Op  Subjective: He is doing fine from the exploratory lap.    Objective: Vital signs in last 24 hours: Temp:  [97.6 F (36.4 C)-98.3 F (36.8 C)] 97.6 F (36.4 C) (09/12 5009) Pulse Rate:  [60] 60 (09/12 0623) Resp:  [20-60] 20 (09/12 0623) BP: (118-131)/(65-77) 118/65 mmHg (09/12 0623) SpO2:  [88 %-94 %] 94 % (09/12 0623) Weight:  [91.9 kg (202 lb 9.6 oz)] 91.9 kg (202 lb 9.6 oz) (09/11 1100) Last BM Date: 07/22/15 Regular diet, but no BM recorded yet. BMP OK Still waiting on Path NUCLEAR MEDICINE PET SKULL BASE TO THIGH:   Thin rim of hypermetabolic activity throughout the peritoneal space consistent with peritoneal carcinomatosis. 2. Focal intense metabolic activity associated with the proximal small bowel consistent with lymphoma involvement of bowel. 3. Hypermetabolic mesenteric nodule /node consistent with lymphoma. 4. Several small hypermetabolic lymph nodes in the RIGHT supraclavicular and internal mammary nodal stations. 5. Nodal metastasis anterior to the liver in the precordial space. This may be the most assessable for biopsy. 6. Normal spleen and bone marrow.  Intake/Output from previous day: 09/11 0701 - 09/12 0700 In: 240 [P.O.:240] Out: -  Intake/Output this shift:    General appearance: alert, cooperative and no distress GI: soft sore + BS, port sites all look good.  Lab Results:   Recent Labs  07/20/15 1646  WBC 9.8  HGB 14.1  HCT 41.9  PLT 231    BMET  Recent Labs  07/22/15 0920 07/23/15 0455  NA 138 138  K 3.3* 4.0  CL 100* 101  CO2 30 29  GLUCOSE 159* 109*  BUN 18 20  CREATININE 0.87 0.92  CALCIUM 7.7* 7.8*   PT/INR No results for input(s): LABPROT, INR in the last 72 hours.   Recent Labs Lab 07/19/15 1019 07/20/15 0407 07/20/15 1646 07/21/15 0538 07/21/15 1800  AST 110* 78* 76* 63* 76*  ALT 80* 61 59 53 61  ALKPHOS 48 40 43 35* 40  BILITOT 0.9 0.6 1.2 0.6 0.6  PROT 5.0* 4.9* 5.2* 5.1* 5.5*  ALBUMIN  2.7* 2.6* 2.8* 2.7* 2.9*     Lipase  No results found for: LIPASE   Studies/Results: Dg Chest Port 1 View  07/22/2015   CLINICAL DATA:  Pulmonary edema, hypoxia  EXAM: PORTABLE CHEST - 1 VIEW  COMPARISON:  07/21/2015  FINDINGS: Heart size is normal. Right-sided PICC line in place with tip over the cavoatrial junction. Hazy perihilar rate greater than left basilar airspace opacities are slightly decreased. Trace pleural effusions.  IMPRESSION: Interval slight decrease in hazy perihilar airspace opacities compatible with improving edema.   Electronically Signed   By: Conchita Paris M.D.   On: 07/22/2015 08:10    Medications: . allopurinol  150 mg Oral BID  . dexamethasone  4 mg Intravenous TID PC  . enoxaparin (LOVENOX) injection  40 mg Subcutaneous Q24H  . pantoprazole  40 mg Oral BID AC  . polyethylene glycol  17 g Oral Daily  . senna-docusate  2 tablet Oral QHS  . sodium chloride  10-40 mL Intracatheter Q12H    Assessment/Plan Intra-abdominal lymphadenopathy, omental infiltration and ascites, Bilateral pleural effusions Intra-abdominal neoplastic process such as lymphoma or carcinoma suspected. S/p:  Diagnostic laparoscopy  Excision 1.5 cm umbilical nodule frozen section, Excision of a second umbilical nodule  Excision of falciform ligament with nodules  Aspiration of ascites for cytology, 07/20/2015, Fanny Skates, MD. Pleural effusion, right/abdominal mass suggestive of B cell lymphoma Postop acute hypoxemic respiratory  failure on 9/9 Acute kidney injury Tumor lysis syndrome: Antibioitics:  None DVT:  Lovenox   Plan:  He can follow up with Dr. Dalbert Batman post op in a couple weeks.  I will put post op info in AVS along with instructions to call for follow up appointment after discharge.  We will see again as need while in hospital.  He can shower from our standpoint.  He does have a PICC line in and that limits showers   LOS: 9 days    Donald [name] 07/23/2015

## 2015-07-23 NOTE — Progress Notes (Signed)
Patient ID: Donald [name], male   DOB: Oct 17, 1976, 39 y.o.   MRN: [id number]    Referring Physician(s): [place]  Chief Complaint:  lymphoma  Subjective:  Pt familiar to IR service from recent ultrasound-guided core biopsy of a periumbilical subcutaneous mass. He has been recently diagnosed with a B cell lymphoproliferative process and request has now been made for CT-guided bone marrow biopsy for further staging.Patient states that he is feeling better, breathing better but still has some mild abdominal discomfort. He denies recent fever, nausea or vomiting or abnormal bleeding.   Allergies: Fluoride preparations and Sulfa antibiotics  Medications: Prior to Admission medications   Medication Sig Start Date End Date Taking? Authorizing Provider  Multiple Vitamin (MULTIVITAMIN) tablet Take 1 tablet by mouth daily.   Yes Historical Provider, MD  gabapentin (NEURONTIN) 300 MG capsule Take 1 capsule (300 mg total) by mouth at bedtime. Patient not taking: Reported on 07/15/2015 07/13/15   Eulas Post, MD  HYDROcodone-acetaminophen (NORCO) 7.5-325 MG per tablet Take 1-2 tablets by mouth every 6 (six) hours as needed for moderate pain. 07/15/15   Charlynne Cousins, MD     Vital Signs: BP 132/68 mmHg  Pulse 65  Temp(Src) 98.4 F (36.9 C) (Oral)  Resp 20  Ht 5' 11" (1.803 m)  Wt 202 lb 9.6 oz (91.9 kg)  BMI 28.27 kg/m2  SpO2 94%  Physical ExamPatient is awake, alert. Chest with slightly diminished breath sounds at bases, heart with regular rate and rhythm. Abdomen soft, slightly distended, positive bowel sounds, mildly tender to palpation; extremities without significant edema.   Imaging: Nm Pet Image Initial (pi) Skull Base To Thigh  07/23/2015   CLINICAL DATA:  Initial treatment strategy for high-grade non-Hodgkin's lymphoma. B-cell lymphoma.  EXAM: NUCLEAR MEDICINE PET SKULL BASE TO THIGH  TECHNIQUE: 10.0 mCi F-18 FDG was injected intravenously. Full-ring PET imaging was performed  from the skull base to thigh after the radiotracer. CT data was obtained and used for attenuation correction and anatomic localization.  FASTING BLOOD GLUCOSE:  Value: 88 mg/dl  COMPARISON:  CT abdomen 07/15/2015, CT thorax 07/14/2015  FINDINGS: NECK  Small hypermetabolic lymph nodes the just deep to the RIGHT clavicle measure 9 mm short axis on image 54, series 4 with intense metabolic activity (SUV max 6.2).  CHEST  Small hypermetabolic internal mammary lymph nodes on the RIGHT (image 66 with SUV max equal 5.). Small hypermetabolic nodule posterior to the sternum in the anterior mediastinum on image 69 of fused data set.  Within the precordial space anterior to the liver there is a 4.5 cm hypermetabolic mass. Smaller hypermetabolic nodule anterior to the RIGHT ventricle.  ABDOMEN/PELVIS  There is a thin rim of intense metabolic activity associated entirety of the peritoneal surface consists with peritoneal metastasis / carcinomatosis. There is more focal activity within the LEFT abdomen associated with the small bowel wall thickening. This segmental thickening measures 17 mm in single wall thickness with intense metabolic activity (SUV max 14.6). Central mesenteric mass measures 3 cm with SUV max 13.2.  There are no hypermetabolic inguinal or iliac lymph nodes.  SKELETON  No focal hypermetabolic activity to suggest skeletal metastasis.  IMPRESSION: 1. Thin rim of hypermetabolic activity throughout the peritoneal space consistent with peritoneal carcinomatosis. 2. Focal intense metabolic activity associated with the proximal small bowel consistent with lymphoma involvement of bowel. 3. Hypermetabolic mesenteric nodule  /node consistent with lymphoma. 4. Several small hypermetabolic lymph nodes in the RIGHT supraclavicular and internal mammary nodal stations. 5.  Nodal metastasis anterior to the liver in the precordial space. This may be the most assessable for biopsy. 6. Normal spleen and bone marrow.    Electronically Signed   By: Suzy Bouchard M.D.   On: 07/23/2015 11:35   Dg Chest Port 1 View  07/22/2015   CLINICAL DATA:  Pulmonary edema, hypoxia  EXAM: PORTABLE CHEST - 1 VIEW  COMPARISON:  07/21/2015  FINDINGS: Heart size is normal. Right-sided PICC line in place with tip over the cavoatrial junction. Hazy perihilar rate greater than left basilar airspace opacities are slightly decreased. Trace pleural effusions.  IMPRESSION: Interval slight decrease in hazy perihilar airspace opacities compatible with improving edema.   Electronically Signed   By: Conchita Paris M.D.   On: 07/22/2015 08:10   Dg Chest Port 1 View  07/21/2015   CLINICAL DATA:  Acute respiratory failure with hypoxemia. Nonsmoker.  EXAM: PORTABLE CHEST - 1 VIEW  COMPARISON:  07/20/2015.  FINDINGS: Cardiomediastinal silhouette appears within normal limits for size. Diffuse pulmonary opacities persist but are slightly improved consistent with resolving edema. Decreased BILATERAL pleural effusions. PICC line tip SVC. No pneumothorax.  IMPRESSION: Improving pulmonary edema.   Electronically Signed   By: Staci Righter M.D.   On: 07/21/2015 09:38   Dg Chest Port 1 View  07/20/2015   CLINICAL DATA:  Shortness of Breath  EXAM: PORTABLE CHEST - 1 VIEW  COMPARISON:  July 15, 2015  FINDINGS: There is alveolar edema throughout both lungs. There are small bilateral effusions. Heart is upper normal in size. There is mild pulmonary venous hypertension. No adenopathy.  IMPRESSION: Extensive edema. Question a degree of congestive heart failure versus noncardiogenic edema. Note that cardiac silhouette is unchanged compared to recent prior study. No adenopathy apparent.   Electronically Signed   By: Lowella Grip III M.D.   On: 07/20/2015 15:36    Labs:  CBC:  Recent Labs  07/18/15 0920 07/19/15 1019 07/20/15 0407 07/20/15 1646  WBC 6.8 8.5 9.4 9.8  HGB 16.0 16.0 14.7 14.1  HCT 46.4 46.7 43.1 41.9  PLT 200 210 229 231     COAGS:  Recent Labs  07/15/15 0440 07/20/15 0407  INR 1.18 1.21  APTT 30 29    BMP:  Recent Labs  07/21/15 0538 07/21/15 1800 07/22/15 0920 07/23/15 0455  NA 136 138 138 138  K 4.2 3.7 3.3* 4.0  CL 99* 98* 100* 101  CO2 _0 GLUCOSE 141* 146* 159* 109*  BUN 24* _1 CALCIUM 7.5* 7.9* 7.7* 7.8*  CREATININE 1.18 0.97 0.87 0.92  GFRNONAA >60 >60 >60 >60  GFRAA >60 >60 >60 >60    LIVER FUNCTION TESTS:  Recent Labs  07/20/15 0407 07/20/15 1646 07/21/15 0538 07/21/15 1800  BILITOT 0.6 1.2 0.6 0.6  AST 78* 76* 63* 76*  ALT 61 59 53 61  ALKPHOS 40 43 35* 40  PROT 4.9* 5.2* 5.1* 5.5*  ALBUMIN 2.6* 2.8* 2.7* 2.9*    Assessment and Plan: Patient with history of newly diagnosed B-cell lymphoma proliferative process. Request now received for CT-guided bone marrow biopsy for further staging.Risks and benefits discussed with the patient including, but not limited to bleeding, infection, damage to adjacent structures or low yield requiring additional tests.All of the patient's questions were answered, patient is agreeable to proceed.Consent signed and in chart. Procedure tent planned for 9/13 a.m.    Signed: D. Rowe Robert 07/23/2015, 3:54 PM   I spent a total of 15  minutes at the the patient's bedside AND on the patient's hospital floor or unit, greater than 50% of which was counseling/coordinating care for CT-guided bone marrow biopsy

## 2015-07-24 ENCOUNTER — Inpatient Hospital Stay (HOSPITAL_COMMUNITY): Payer: BLUE CROSS/BLUE SHIELD

## 2015-07-24 ENCOUNTER — Encounter (HOSPITAL_COMMUNITY): Payer: Self-pay | Admitting: Radiology

## 2015-07-24 ENCOUNTER — Other Ambulatory Visit: Payer: Self-pay | Admitting: Diagnostic Radiology

## 2015-07-24 DIAGNOSIS — C8370 Burkitt lymphoma, unspecified site: Secondary | ICD-10-CM

## 2015-07-24 DIAGNOSIS — R946 Abnormal results of thyroid function studies: Secondary | ICD-10-CM

## 2015-07-24 DIAGNOSIS — R918 Other nonspecific abnormal finding of lung field: Secondary | ICD-10-CM

## 2015-07-24 LAB — PROTEIN, CSF: TOTAL PROTEIN, CSF: 15 mg/dL (ref 15–45)

## 2015-07-24 LAB — COMPREHENSIVE METABOLIC PANEL
ALBUMIN: 2.9 g/dL — AB (ref 3.5–5.0)
ALT: 97 U/L — AB (ref 17–63)
AST: 88 U/L — AB (ref 15–41)
Alkaline Phosphatase: 45 U/L (ref 38–126)
Anion gap: 8 (ref 5–15)
BUN: 21 mg/dL — AB (ref 6–20)
CHLORIDE: 103 mmol/L (ref 101–111)
CO2: 27 mmol/L (ref 22–32)
CREATININE: 0.77 mg/dL (ref 0.61–1.24)
Calcium: 8.1 mg/dL — ABNORMAL LOW (ref 8.9–10.3)
GFR calc Af Amer: >60 mL/min (ref >60)
GLUCOSE: 96 mg/dL (ref 65–99)
POTASSIUM: 4.1 mmol/L (ref 3.5–5.1)
SODIUM: 138 mmol/L (ref 135–145)
Total Bilirubin: 0.7 mg/dL (ref 0.3–1.2)
Total Protein: 5.5 g/dL — ABNORMAL LOW (ref 6.5–8.1)

## 2015-07-24 LAB — CBC WITH DIFFERENTIAL/PLATELET
BASOS ABS: 0 10*3/uL (ref 0.0–0.1)
BASOS PCT: 0 % (ref 0–1)
Eosinophils Absolute: 0 10*3/uL (ref 0.0–0.7)
Eosinophils Relative: 0 % (ref 0–5)
HCT: 38.1 % — ABNORMAL LOW (ref 39.0–52.0)
Hemoglobin: 12.8 g/dL — ABNORMAL LOW (ref 13.0–17.0)
LYMPHS PCT: 20 % (ref 12–46)
Lymphs Abs: 1.7 10*3/uL (ref 0.7–4.0)
MCH: 29.2 pg (ref 26.0–34.0)
MCHC: 33.6 g/dL (ref 30.0–36.0)
MCV: 87 fL (ref 78.0–100.0)
MONO ABS: 1.1 10*3/uL — AB (ref 0.1–1.0)
Monocytes Relative: 13 % — ABNORMAL HIGH (ref 3–12)
NEUTROS ABS: 5.5 10*3/uL (ref 1.7–7.7)
Neutrophils Relative %: 67 % (ref 43–77)
PLATELETS: 216 10*3/uL (ref 150–400)
RBC: 4.38 MIL/uL (ref 4.22–5.81)
RDW: 12.9 % (ref 11.5–15.5)
WBC: 8.3 10*3/uL (ref 4.0–10.5)

## 2015-07-24 LAB — PHOSPHORUS: Phosphorus: 4.7 mg/dL — ABNORMAL HIGH (ref 2.5–4.6)

## 2015-07-24 LAB — CSF CELL COUNT WITH DIFFERENTIAL
RBC COUNT CSF: 2 /mm3 — AB
TUBE #: 1
WBC, CSF: 1 /mm3 (ref 0–5)

## 2015-07-24 LAB — URIC ACID: Uric Acid, Serum: 5.5 mg/dL (ref 4.4–7.6)

## 2015-07-24 LAB — BONE MARROW EXAM

## 2015-07-24 LAB — LACTATE DEHYDROGENASE: LDH: 913 U/L — AB (ref 98–192)

## 2015-07-24 LAB — GLUCOSE, CSF: GLUCOSE CSF: 59 mg/dL (ref 40–70)

## 2015-07-24 MED ORDER — LORAZEPAM 0.5 MG PO TABS
0.5000 mg | ORAL_TABLET | Freq: Four times a day (QID) | ORAL | Status: DC | PRN
  Administered 2015-07-25: 0.5 mg via ORAL
  Administered 2015-07-26 – 2015-07-27 (×2): 1 mg via ORAL
  Administered 2015-07-30: 0.5 mg via ORAL
  Filled 2015-07-24 (×2): qty 2
  Filled 2015-07-24 (×2): qty 1

## 2015-07-24 MED ORDER — SODIUM CHLORIDE 0.9 % IV SOLN
3.0000 mg | Freq: Every day | INTRAVENOUS | Status: DC | PRN
  Filled 2015-07-24: qty 2

## 2015-07-24 MED ORDER — MIDAZOLAM HCL 2 MG/2ML IJ SOLN
INTRAMUSCULAR | Status: AC | PRN
Start: 2015-07-24 — End: 2015-07-24
  Administered 2015-07-24: 1 mg via INTRAVENOUS
  Administered 2015-07-24 (×3): 0.5 mg via INTRAVENOUS

## 2015-07-24 MED ORDER — MIDAZOLAM HCL 2 MG/2ML IJ SOLN
INTRAMUSCULAR | Status: AC
  Filled 2015-07-24: qty 4

## 2015-07-24 MED ORDER — SODIUM CHLORIDE 0.45 % IV SOLN
INTRAVENOUS | Status: DC
  Administered 2015-07-24 – 2015-07-29 (×9): via INTRAVENOUS
  Filled 2015-07-24 (×21): qty 50

## 2015-07-24 MED ORDER — FENTANYL CITRATE (PF) 100 MCG/2ML IJ SOLN
INTRAMUSCULAR | Status: AC | PRN
  Administered 2015-07-24: 25 ug via INTRAVENOUS
  Administered 2015-07-24: 50 ug via INTRAVENOUS
  Administered 2015-07-24: 25 ug via INTRAVENOUS

## 2015-07-24 MED ORDER — FENTANYL CITRATE (PF) 100 MCG/2ML IJ SOLN
INTRAMUSCULAR | Status: AC
  Filled 2015-07-24: qty 2

## 2015-07-24 NOTE — Progress Notes (Signed)
[facility] PROGRESS NOTE  Donald [name] YNW:[id number] DOB: Apr 01, 1976 DOA: 07/14/2015 PCP: Drema Pry, DO  brief narrative 39 year old male with no medical history presented with 2 weeks of back pain while riding his bike. He had a chest x-ray done as outpatient which suggested a possible infection and was given antibiotic. However patient returned to the ED as he had right chest pain and was short of breath. In the ED was found to have a large right-sided pleural effusion and was admitted for further workup. Patient transferred to [facility] long for excisional core biopsy.  Assessment/Plan: Pleural effusion, right/abdominal mass suggestive of B cell lymphoma: -CT scan of the chest with contrast: -  significant effusion with compression of the right lung. -CT scan of the abdomen :- peritoneal carcinomatosis with no lymphadenopathy -right  Thoracocentesis suggests exudate. -oncology following. Started on decadron on 9/10 after laparoscopic excisional biopsy on 9/9. PET scan  9/12. -PICC line placed on 9/9 -[place] core biopsy of omental mass and diagnostic/therapeutic paracentesis, pending pathology. tissue flow cytometry suggests proliferative B cell. -plan to start inpatient treatment. -SP LP and BM biopsy.   Postop Acute Hypoxemic Respiratory Failure on 9/9 Patient extubated and placed on nonrebreather. Given 10 mg IV Lasix. Lactic acid mildly elevated. Chest x-ray shows extensive pulmonary edema. Discontinued IV fluids. Received 2 doses of IV lasix ( total 40 mg on 9/10). Currently maintaining sats on 2.5 L via nasal cannula.  Continue incentive spirometry. Repeat chest x-ray showed improving pulmonary edema. Received one time dose of lasix 9-11--9-12.  Weight at 90 Kg. Hold on lasix doses. Follow clinically.   Acute kidney injury -possibly due to tumor lysis syndrome and IV contrast -Renal function back to normal.  Tumor lysis syndrome: -Resumed allopurinol since LFTs improved. -Renal  function improving and uric acid normal. Stopped  IV fluids due to fluid overload.  Hypokalemia Resolved.   GERD Added PPI twice a day and Maalox. Symptoms improved.  Elevated TSH and free T4 No clear etiology.? Thyroiditis versus secondary hyper thyroidism. Follow thyroglobulin antibody and stimulating immunoglobulin.  Screening process: -non reactive HIV -CMV IGM and antibody neg -EBV results (DNA by PCR and VCA antibody) negative.  -ESR 6 -CRP 4.3 -hepatitis panel neg -AFP 4.5 -HCG tumor marker < 1     DVT prophylaxis: sq heparin  Diet: Regular   Consultants:  [facility] ( Dr Irene Limbo) [facility]  Procedures:  Thoracocentesis  Paracentesis  laproscopy abdominal biopsy  Antibiotics:  None  CODE STATUS: Full code Family communication: None at bedside today.  Disposition: Transferred to medical floor. PET scan on 9/12. Home possibly on 9/13 if breathing  continues to improve and outpatient therapy planned.   HPI/Subjective: Has not required pain medication overnight. Just came from LP , BM biopsy.  Breathing ok.   Objective: Filed Vitals:   07/24/15 1220  BP: 125/72  Pulse: 55  Temp: 98.4 F (36.9 C)  Resp:     Intake/Output Summary (Last 24 hours) at 07/24/15 1341 Last data filed at 07/23/15 2148  Gross per 24 hour  Intake     20 ml  Output    300 ml  Net   -280 ml   Filed Weights   07/22/15 1100 07/23/15 1635 07/24/15 0500  Weight: 91.9 kg (202 lb 9.6 oz) 90.719 kg (200 lb) 90.719 kg (200 lb)    Exam:   General:  Middle aged male in no acute distress  Chest: crackles base, more pronounced on the right.   CVS:  Normal S1 and S2, no murmurs  GI: Soft, abdominal distention, nontender, bowel sounds present  Musculoskeletal: Warm, no edema  CNS: Alert and oriented  Data Reviewed: Basic Metabolic Panel:  Recent Labs Lab 07/18/15 0920  07/20/15 0407  07/20/15 1815 07/21/15 0538 07/21/15 1800 07/22/15 0920  07/23/15 0455 07/24/15 0500  NA 136  < > 138  < >  --  136 138 138 138 138  K 4.0  < > 4.2  < >  --  4.2 3.7 3.3* 4.0 4.1  CL 95*  < > 102  < >  --  99* 98* 100* 101 103  CO2 31  < > 21*  < >  --  _0 GLUCOSE 118*  < > 76  < >  --  141* 146* 159* 109* 96  BUN 12  < > 22*  < >  --  24* _1 21*  CREATININE 1.33*  < > 1.54*  < >  --  1.18 0.97 0.87 0.92 0.77  CALCIUM 8.2*  < > 7.5*  < >  --  7.5* 7.9* 7.7* 7.8* 8.1*  MG  --   --  1.8  --   --   --   --   --   --   --   PHOS 2.1*  --  5.8*  --  6.0*  --  3.7  --   --  4.7*  < > = values in this interval not displayed. Liver Function Tests:  Recent Labs Lab 07/20/15 0407 07/20/15 1646 07/21/15 0538 07/21/15 1800 07/24/15 0500  AST 78* 76* 63* 76* 88*  ALT 61 59 53 61 97*  ALKPHOS 40 43 35* 40 45  BILITOT 0.6 1.2 0.6 0.6 0.7  PROT 4.9* 5.2* 5.1* 5.5* 5.5*  ALBUMIN 2.6* 2.8* 2.7* 2.9* 2.9*   No results for input(s): LIPASE, AMYLASE in the last 168 hours. No results for input(s): AMMONIA in the last 168 hours. CBC:  Recent Labs Lab 07/18/15 0920 07/19/15 1019 07/20/15 0407 07/20/15 1646 07/24/15 0500  WBC 6.8 8.5 9.4 9.8 8.3  NEUTROABS 4.2 5.9 5.8 8.6* 5.5  HGB 16.0 16.0 14.7 14.1 12.8*  HCT 46.4 46.7 43.1 41.9 38.1*  MCV 86.1 85.7 86.4 86.9 87.0  PLT 200 210 229 231 216   Cardiac Enzymes:  Recent Labs Lab 07/21/15 0538  TROPONINI <0.03   BNP (last 3 results)  Recent Labs  07/20/15 1646  BNP 13.9    ProBNP (last 3 results) No results for input(s): PROBNP in the last 8760 hours.  CBG:  Recent Labs Lab 07/23/15 0908  GLUCAP 88    Recent Results (from the past 240 hour(s))  AFB culture with smear     Status: None (Preliminary result)   Collection Time: 07/15/15  3:23 PM  Result Value Ref Range Status   Specimen Description PLEURAL RIGHT FLUID  Final   Special Requests PLEURAL RIGHT FLUID  Final   Acid Fast Smear   Final    NO ACID FAST BACILLI SEEN Performed at [facility]    Culture   Final    CULTURE WILL BE EXAMINED FOR 6 WEEKS BEFORE ISSUING A FINAL REPORT Performed at [facility]    Report Status PENDING  Incomplete  Fungus Culture with Smear     Status: None (Preliminary result)   Collection Time: 07/15/15  3:23 PM  Result Value Ref Range Status   Specimen Description PLEURAL RIGHT  FLUID  Final   Special Requests PLEURAL RIGHT FLUID  Final   Fungal Smear   Final    NO YEAST OR FUNGAL ELEMENTS SEEN Performed at [facility]    Culture   Final    CULTURE IN PROGRESS FOR FOUR WEEKS Performed at [facility]    Report Status PENDING  Incomplete  Culture, body fluid-bottle     Status: None   Collection Time: 07/15/15  3:23 PM  Result Value Ref Range Status   Specimen Description FLUID PLEURAL  Final   Special Requests BOTTLES DRAWN AEROBIC AND ANAEROBIC 5CC  Final   Culture NO GROWTH 5 DAYS  Final   Report Status 07/20/2015 FINAL  Final  Gram stain     Status: None   Collection Time: 07/15/15  3:23 PM  Result Value Ref Range Status   Specimen Description FLUID PLEURAL  Final   Special Requests NONE  Final   Gram Stain   Final    ABUNDANT WBC PRESENT,BOTH PMN AND MONONUCLEAR NO ORGANISMS SEEN GRAM STAIN REVIEWED-AGREE WITH RESULT K WOOLEN    Report Status 07/15/2015 FINAL  Final  Surgical pcr screen     Status: None   Collection Time: 07/19/15 10:12 PM  Result Value Ref Range Status   MRSA, PCR NEGATIVE NEGATIVE Final   Staphylococcus aureus NEGATIVE NEGATIVE Final    Comment:        The Xpert SA Assay (FDA approved for NASAL specimens in patients over 33 years of age), is one component of a comprehensive surveillance program.  Test performance has been validated by [facility] for patients greater than or equal to 55 year old. It is not intended to diagnose infection nor to guide or monitor treatment.      Studies: Nm Pet Image Initial (pi) Skull Base To Thigh  07/23/2015   CLINICAL DATA:   Initial treatment strategy for high-grade non-Hodgkin's lymphoma. B-cell lymphoma.  EXAM: NUCLEAR MEDICINE PET SKULL BASE TO THIGH  TECHNIQUE: 10.0 mCi F-18 FDG was injected intravenously. Full-ring PET imaging was performed from the skull base to thigh after the radiotracer. CT data was obtained and used for attenuation correction and anatomic localization.  FASTING BLOOD GLUCOSE:  Value: 88 mg/dl  COMPARISON:  CT abdomen 07/15/2015, CT thorax 07/14/2015  FINDINGS: NECK  Small hypermetabolic lymph nodes the just deep to the RIGHT clavicle measure 9 mm short axis on image 54, series 4 with intense metabolic activity (SUV max 6.2).  CHEST  Small hypermetabolic internal mammary lymph nodes on the RIGHT (image 66 with SUV max equal 5.). Small hypermetabolic nodule posterior to the sternum in the anterior mediastinum on image 69 of fused data set.  Within the precordial space anterior to the liver there is a 4.5 cm hypermetabolic mass. Smaller hypermetabolic nodule anterior to the RIGHT ventricle.  ABDOMEN/PELVIS  There is a thin rim of intense metabolic activity associated entirety of the peritoneal surface consists with peritoneal metastasis / carcinomatosis. There is more focal activity within the LEFT abdomen associated with the small bowel wall thickening. This segmental thickening measures 17 mm in single wall thickness with intense metabolic activity (SUV max 14.6). Central mesenteric mass measures 3 cm with SUV max 13.2.  There are no hypermetabolic inguinal or iliac lymph nodes.  SKELETON  No focal hypermetabolic activity to suggest skeletal metastasis.  IMPRESSION: 1. Thin rim of hypermetabolic activity throughout the peritoneal space consistent with peritoneal carcinomatosis. 2. Focal intense metabolic activity associated with the proximal small  bowel consistent with lymphoma involvement of bowel. 3. Hypermetabolic mesenteric nodule  /node consistent with lymphoma. 4. Several small hypermetabolic lymph nodes  in the RIGHT supraclavicular and internal mammary nodal stations. 5. Nodal metastasis anterior to the liver in the precordial space. This may be the most assessable for biopsy. 6. Normal spleen and bone marrow.   Electronically Signed   By: Suzy Bouchard M.D.   On: 07/23/2015 11:35   Ct Biopsy  07/24/2015   CLINICAL DATA:  39 year old male with a new diagnosis of lymphoma.  EXAM: CT BIOPSY  Date: 07/24/2015  PROCEDURE: 1. CT guided bone marrow aspiration and core biopsy Interventional Radiologist:  Criselda Peaches, MD  ANESTHESIA/SEDATION: Moderate (conscious) sedation was used. 2.5 mg Versed, 100 mcg Fentanyl were administered intravenously. The patient's vital signs were monitored continuously by radiology nursing throughout the procedure.  Sedation Time: 9 minutes  FLUOROSCOPY TIME:  None  TECHNIQUE: Informed consent was obtained from the patient following explanation of the procedure, risks, benefits and alternatives. The patient understands, agrees and consents for the procedure. All questions were addressed. A time out was performed.  The patient was positioned prone and noncontrast localization CT was performed of the pelvis to demonstrate the iliac marrow spaces.  Maximal barrier sterile technique utilized including caps, mask, sterile gowns, sterile gloves, large sterile drape, hand hygiene, and betadine prep.  Under sterile conditions and local anesthesia, an 11 gauge coaxial bone biopsy needle was advanced into the right iliac marrow space. Needle position was confirmed with CT imaging. Initially, bone marrow aspiration was performed. Next, the 11 gauge outer cannula was utilized to obtain a right iliac bone marrow core biopsy. Needle was removed. Hemostasis was obtained with compression. The patient tolerated the procedure well. Samples were prepared with the cytotechnologist. No immediate complications.  IMPRESSION: CT guided right iliac bone marrow aspiration and core biopsy. The On-Control  drill was used.  Signed,  Criselda Peaches, MD  [facility]  [facility]   Electronically Signed   By: Jacqulynn Cadet M.D.   On: 07/24/2015 12:32   Dg Fluoro Guide Lumbar Puncture  07/24/2015   CLINICAL DATA:  Lymphoma  EXAM: DIAGNOSTIC LUMBAR PUNCTURE UNDER FLUOROSCOPIC GUIDANCE  FLUOROSCOPY TIME:  Radiation Exposure Index (as provided by the fluoroscopic device):  If the device does not provide the exposure index:  Fluoroscopy Time (in minutes and seconds):  13 seconds  Number of Acquired Images:  0  PROCEDURE: Informed consent was obtained from the patient prior to the procedure, including potential complications of headache, allergy, and pain. With the patient prone, the lower back was prepped with Betadine. 1% Lidocaine was used for local anesthesia. Lumbar puncture was performed at the L3-4 level using a 22 gauge needle with return of clear CSF with an opening pressure of 14 cm water. 12 ml of CSF were obtained for laboratory studies. The patient tolerated the procedure well and there were no apparent complications.  IMPRESSION: 1. Technically successful lumbar puncture. 12 cc of clear CSF were obtained for laboratory studies.   Electronically Signed   By: Kerby Moors M.D.   On: 07/24/2015 12:31    Scheduled Meds: . allopurinol  150 mg Oral BID  . dexamethasone  4 mg Intravenous TID PC  . enoxaparin (LOVENOX) injection  40 mg Subcutaneous Q24H  . fentaNYL      . midazolam      . pantoprazole  40 mg Oral BID AC  . senna-docusate  2  tablet Oral QHS  . sodium chloride  10-40 mL Intracatheter Q12H   Continuous Infusions: . sodium chloride 10 mL/hr at 07/15/15 1023     Time spent: 25 minutes    Kanishk Stroebel, [facility] Pager [phone] If 7PM-7AM, please contact night-coverage at [email or web address], password [facility] 07/24/2015, 1:41 PM  LOS: 10 days

## 2015-07-24 NOTE — Care Management Note (Signed)
Case Management Note  Patient Details  Name: Donald [name] MRN: [id number] Date of Birth: [birth year]  Subjective/Objective:             39 yo admitted with Pleural Effusion   Action/Plan: From home with spouse.  Expected Discharge Date:                  Expected Discharge Plan:  Home/Self Care  In-House Referral:     Discharge planning Services  CM Consult  Post Acute Care Choice:    Choice offered to:     DME Arranged:    DME Agency:     HH Arranged:    HH Agency:     Status of Service:  In process, will continue to follow  Medicare Important Message Given:    Date Medicare IM Given:    Medicare IM give by:    Date Additional Medicare IM Given:    Additional Medicare Important Message give by:     If discussed at [place] of Stay Meetings, dates discussed:    Additional Comments: Chart reviewed and CM following for DC needs.  Lynnell Catalan, RN 07/24/2015, 2:14 PM

## 2015-07-24 NOTE — Procedures (Signed)
Interventional Radiology Procedure Note  Procedure: CT guided aspirate and core biopsy of right iliac bone Complications: None Recommendations: - Bedrest supine x 2 hrs - Hydrocodone PRN  Pain - Follow biopsy results  Signed,  Heath K. McCullough, MD   

## 2015-07-24 NOTE — Progress Notes (Signed)
Bone Marrow Biopsy and Lumbar Puncture site dressings clean dry and intact. Pt denies any discomfort or pain at this time.

## 2015-07-24 NOTE — Progress Notes (Signed)
Donald [name]   HEMATOLOGY/ONCOLOGY INPATIENT PROGRESS NOTE  Date of Service: 07/24/2015  Inpatient Attending: .Elmarie Shiley, MD   SUBJECTIVE  Patient seen this evening.  He had his staging bone marrow biopsy and lumbar puncture done this morning.  Eating well.  Good bowel movements.  No acute new concerns.  We discussed his final pathology results showing Burkitt's lymphoma.  We discussed in details different treatment options pros and cons of each approach and available data.  Patient decided to proceed with dose adjusted EPOCH-R chemotherapy and prefers to start this tomorrow.  We discussed length the potential toxicities of this regimen and I gave him handouts regarding this as well as other details of this regimen and available data including the [place] [facility] article in 2013.  He signed an informed consent and prefers to stop his chemotherapy tomorrow.  His mother was at bedside for most of the discussion. I placed orders for the chemotherapy and all the monitoring labs and informed the pharmacist.  Patient is available is significant tumor lysis syndrome and need for frequent lab monitoring.  He is also aware of the increased risk of possible bowel perforation given involvement of the small bowel.  All of his questions regarding the diagnosis, prognosis, treatment options, potential toxicities were answered in detail to his apparent satisfaction.  OBJECTIVE:  PHYSICAL EXAMINATION: . Filed Vitals:   07/24/15 1000 07/24/15 1220 07/24/15 1439 07/24/15 2131  BP: 105/66 125/72 115/58 120/64  Pulse: 59 55 64 51  Temp: 97.4 F (36.3 C) 98.4 F (36.9 C) 98 F (36.7 C) 97.8 F (36.6 C)  TempSrc: Axillary Oral Oral Oral  Resp:   18 16  Height:      Weight:      SpO2: 97% 92% 94% 96%   Filed Weights   07/22/15 1100 07/23/15 1635 07/24/15 0500  Weight: 202 lb 9.6 oz (91.9 kg) 200 lb (90.719 kg) 200 lb (90.719 kg)   .Body mass index is 27.91 kg/(m^2).  GENERAL: SKIN:no  acute rashes. EYES: normal, conjunctiva are pink and non-injected, sclera clear OROPHARYNX:no exudate, no erythema and lips, buccal mucosa, and tongue normal  NECK: supple, no JVD, thyroid normal size, non-tender, without nodularity LYMPH:  no palpable lymphadenopathy in the cervical, axillary or inguinal LUNGS:Decreased breath sounds rt base. No overt rales noted. HEART: regular rate & rhythm,  no murmurs and trace lower extremity edema ABDOMEN: abdomen mildly distended, BS normoactive, laparoscopic incisions clean. PSYCH: alert & oriented x 3 with fluent speech NEURO: no focal motor/sensory deficits  MEDICAL HISTORY:  Past Medical History  Diagnosis Date  . EBV infection 2013    Patient notes that he had an EBV infection in 2013 that left him with chronic fatigue. She also notes that he had significant lymphadenopathy and thyroiditis at the time. He has been managing his symptoms with an alternative medicine practitioner in [place] and with other alternative medicines.  . Marijuana use, episodic     SURGICAL HISTORY: Past Surgical History  Procedure Laterality Date  . Tonsillectomy    . Appendectomy    . Laparoscopy N/A 07/20/2015    Procedure: LAPAROSCOPY DIAGNOSTIC, MULTIPLE BIOPSIES, DRAINAGE OF ASCITES;  Surgeon: Fanny Skates, MD;  Location: [facility];  Service: General;  Laterality: N/A;    SOCIAL HISTORY: Social History   Social History  . Marital Status: Married    Spouse Name: N/A  . Number of Children: N/A  . Years of Education: N/A   Occupational History  . Not  on file.   Social History Main Topics  . Smoking status: Never Smoker   . Smokeless tobacco: Not on file  . Alcohol Use: No  . Drug Use: 1.00 per week    Special: Marijuana  . Sexual Activity: Not on file   Other Topics Concern  . 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   He has a fiance and has  been in a steady relationship.      He has tried to maintain a very healthy lifestyle and cycles about 100 miles a week. He also uses a fair number of over-the-counter alternative medicines to stay healthy.    FAMILY HISTORY: Family History  Problem Relation Age of Onset  . Heart failure Father     ALLERGIES:  is allergic to fluoride preparations and sulfa antibiotics.  MEDICATIONS:  Scheduled Meds: . allopurinol  150 mg Oral BID  . enoxaparin (LOVENOX) injection  40 mg Subcutaneous Q24H  . pantoprazole  40 mg Oral BID AC  . senna-docusate  2 tablet Oral QHS  . sodium chloride  10-40 mL Intracatheter Q12H   Continuous Infusions: . sodium chloride 10 mL/hr at 07/15/15 1023  .  sodium bicarbonate  infusion 1000 mL 75 mL/hr at 07/24/15 1809   PRN Meds:.fludeoxyglucose F - 18, gi cocktail, HYDROcodone-acetaminophen, LORazepam, morphine injection, ondansetron (ZOFRAN) IV, [START ON 07/25/2015] rasburicase (ELITEK) IV infusion, sodium chloride  REVIEW OF SYSTEMS:    10 Point review of Systems was done is negative except as noted above.   LABORATORY DATA:  I have reviewed the data as listed  . CBC Latest Ref Rng 07/24/2015 07/20/2015 07/20/2015  WBC 4.0 - 10.5 K/uL 8.3 9.8 9.4  Hemoglobin 13.0 - 17.0 g/dL 12.8(L) 14.1 14.7  Hematocrit 39.0 - 52.0 % 38.1(L) 41.9 43.1  Platelets 150 - 400 K/uL 216 231 229   . CBC    Component Value Date/Time   WBC 8.3 07/24/2015 0500   RBC 4.38 07/24/2015 0500   RBC 5.39 07/18/2015 0920   HGB 12.8* 07/24/2015 0500   HCT 38.1* 07/24/2015 0500   PLT 216 07/24/2015 0500   MCV 87.0 07/24/2015 0500   MCH 29.2 07/24/2015 0500   MCHC 33.6 07/24/2015 0500   RDW 12.9 07/24/2015 0500   LYMPHSABS 1.7 07/24/2015 0500   MONOABS 1.1* 07/24/2015 0500   EOSABS 0.0 07/24/2015 0500   BASOSABS 0.0 07/24/2015 0500      . CMP Latest Ref Rng 07/24/2015 07/23/2015 07/22/2015  Glucose 65 - 99 mg/dL 96 109(H) 159(H)  BUN 6 - 20 mg/dL 21(H) 20 18  Creatinine  0.61 - 1.24 mg/dL 0.77 0.92 0.87  Sodium 135 - 145 mmol/L 138 138 138  Potassium 3.5 - 5.1 mmol/L 4.1 4.0 3.3(L)  Chloride 101 - 111 mmol/L 103 101 100(L)  CO2 22 - 32 mmol/L 27 29 30   Calcium 8.9 - 10.3 mg/dL 8.1(L) 7.8(L) 7.7(L)  Total Protein 6.5 - 8.1 g/dL 5.5(L) - -  Total Bilirubin 0.3 - 1.2 mg/dL 0.7 - -  Alkaline Phos 38 - 126 U/L 45 - -  AST 15 - 41 U/L 88(H) - -  ALT 17 - 63 U/L 97(H) - -   Component     Latest Ref Rng 07/24/2015  Tube #      1  Color, CSF     COLORLESS COLORLESS  Appearance, CSF     CLEAR CLEAR (  A)  Supernatant      NOT INDICATED  RBC Count, CSF     0 /cu mm 2 (H)  WBC, CSF     0 - 5 /cu mm 1  Other Cells, CSF      TOO FEW TO COUNT, SMEAR AVAILABLE FOR REVIEW  Specimen Description      CSF  Special Requests      NONE  Gram Stain      WBC PRESENT, PREDOMINANTLY MONONUCLEAR . . .  Culture      PENDING  Report Status      PENDING  Glucose, CSF     40 - 70 mg/dL 59  Total  Protein, CSF     15 - 45 mg/dL 15     RADIOGRAPHIC STUDIES: I have personally reviewed the radiological images as listed and agreed with the findings in the report. Dg Chest 2 View  07/15/2015   CLINICAL DATA:  Followup right pleural effusion, status post right thoracentesis.  EXAM: CHEST  2 VIEW  COMPARISON:  Yesterday.  FINDINGS: Stable right upper companion rib shadows. No pneumothorax seen. Significantly decreased right pleural fluid. The interstitial markings remain mildly prominent. Normal sized heart. Interval mild left lower lobe linear density. Unremarkable bones.  IMPRESSION: 1. No pneumothorax following right thoracentesis. 2. Significantly less right pleural fluid. 3. Interval mild left lower lobe atelectasis. 4. Stable mild chronic interstitial lung disease.   Electronically Signed   By: Claudie Revering M.D.   On: 07/15/2015 17:21   Dg Chest 2 View  07/14/2015   CLINICAL DATA:  Right-sided chest pain for 2-3 days. Shortness of breath.  EXAM: CHEST  2 VIEW   COMPARISON:  07/12/2015  FINDINGS: Heart size is probably normal. Suspect lung opacity at the right base in addition to the elevated hemidiaphragm. Opacity at the right lung base may be related to subpulmonic effusion. Overall the appearance is stable since previous exam. There is a trace left pleural effusion. No pulmonary edema.  IMPRESSION: Persistent significant opacity at the right lung base. Consider right lateral decubitus view to determine component of layering pleural effusion.   Electronically Signed   By: Nolon Nations M.D.   On: 07/14/2015 16:56   Dg Chest 2 View  07/12/2015   CLINICAL DATA:  Anterior chest pain for 1 week with cough  EXAM: CHEST  2 VIEW  COMPARISON:  None.  FINDINGS: There is mild elevation the right hemidiaphragm. There is evidence suggesting consolidation in the medial right base. Lungs elsewhere clear. Heart size and pulmonary vascularity are normal. No adenopathy. No bone lesions.  IMPRESSION: Evidence of a degree of consolidation in the medial right base. There is elevation of the right hemidiaphragm. Lungs elsewhere clear. Cardiac silhouette within normal limits.  Followup PA and lateral chest radiographs recommended in 3-4 weeks following trial of antibiotic therapy to ensure resolution and exclude underlying malignancy.   Electronically Signed   By: Lowella Grip III M.D.   On: 07/12/2015 15:52   Ct Angio Chest Pe W/cm &/or Wo Cm  07/14/2015   CLINICAL DATA:  Right chest pain  EXAM: CT ANGIOGRAPHY CHEST WITH CONTRAST  TECHNIQUE: Multidetector CT imaging of the chest was performed using the standard protocol during bolus administration of intravenous contrast. Multiplanar CT image reconstructions and MIPs were obtained to evaluate the vascular anatomy.  CONTRAST:  147m OMNIPAQUE IOHEXOL 350 MG/ML SOLN  COMPARISON:  Chest x-ray 07/14/2015  FINDINGS: Negative for pulmonary embolism. Negative for aortic dissection or aneurysm  Moderately large right pleural effusion with  compressive atelectasis of the right lower lobe. Atelectasis also present in the right middle lobe. Partial collapse of the right upper lobe medially adjacent to the mediastinum. This extends into the right upper lobe laterally. Small left pleural effusion. No significant infiltrate on the left.  No adenopathy is present in the mediastinum.  No acute bony abnormality  In the upper abdomen, there is mild to moderate ascites. This could be due to liver disease. There is fluid around the liver and spleen. The spleen is not enlarged. Limited imaging of the pancreas and kidneys are grossly normal. There is soft tissue infiltration in the omentum. Carcinoma not excluded.  Review of the MIP images confirms the above findings.  IMPRESSION: Negative for pulmonary embolism  Moderate right pleural effusion with compressive atelectasis in the right lower lobe. Additional atelectasis in the right middle lobe and right upper lobe. No definite mass or pneumonia however infection and tumor are in the differential. There is a small left effusion  Concerning findings in the abdomen. There is ascites around the liver and spleen. There is soft tissue density involving the omentum which can be seen with carcinoma. Further imaging of the abdomen with CT abdomen pelvis with contrast is suggested.   Electronically Signed   By: Franchot Gallo M.D.   On: 07/14/2015 18:28   Ct Abdomen Pelvis W Contrast  07/15/2015   CLINICAL DATA:  39 year old male with ascites and potential omental disease noted on prior chest CT. Follow-up abdominal CT scan.  EXAM: CT ABDOMEN AND PELVIS WITH CONTRAST  TECHNIQUE: Multidetector CT imaging of the abdomen and pelvis was performed using the standard protocol following bolus administration of intravenous contrast.  CONTRAST:  182m OMNIPAQUE IOHEXOL 300 MG/ML  SOLN  COMPARISON:  CT of the chest 07/14/2015. CT of the pelvis 03/03/2008.  FINDINGS: Lower chest: Small bilateral pleural effusions (left greater than  right). Subsegmental atelectasis in the posterior aspect of the right lower lobe.  Hepatobiliary: No cystic or solid hepatic lesion identified. No intra or extrahepatic biliary ductal dilatation. High attenuation material filling the gallbladder, presumably vicarious excretion of contrast material from the recent chest CT.  Pancreas: No pancreatic mass. No pancreatic ductal dilatation. No pancreatic or peripancreatic fluid or inflammatory changes.  Spleen: Unremarkable.  Adrenals/Urinary Tract: Bilateral adrenal glands and bilateral kidneys are unremarkable in appearance. No hydroureteronephrosis. Urinary bladder is unremarkable in appearance.  Stomach/Bowel: In the mid small bowel (like mid to distal jejunum) there is a profoundly asymmetrically thickened loop of bowel best demonstrated on image 56 of series 2, where the wall measures up to 22 mm in thickness medially. Adjacent to this there are soft tissue masses extending into the root of the small bowel mesentery, largest of which measures 2.7 x 3.3 cm (image 47 of series 2), likely malignant lymph nodes. The appendix is not confidently identified. No pathologic dilatation of small bowel or colon. Stomach is grossly normal in appearance.  Vascular/Lymphatic: No significant atherosclerotic disease, aneurysm or dissection identified in the abdominal or pelvic vasculature. Circumaortic left renal vein (normal anatomical variant) incidentally noted. Probable mesenteric lymphadenopathy, as discussed above.  Reproductive: Prostate gland and seminal vesicles are unremarkable in appearance. Inguinal canals and visualized portions of the scrotum were grossly unremarkable in appearance. No lymphadenopathy noted along the gonadal drainage system.  Other: Large amount of abnormal enhancing soft tissue throughout the peritoneal cavity, most notable in the omentum, particularly in the right upper quadrant where there is bulky  omental caking. Small volume of presumably  malignant ascites. No pneumoperitoneum. Some abnormal soft tissue is extending through the anterior abdominal wall into the periumbilical region (image 53 of series 2).  Musculoskeletal: There are no aggressive appearing lytic or blastic lesions noted in the visualized portions of the skeleton.  IMPRESSION: 1. Widespread intraperitoneal malignancy, as demonstrated by extensive peritoneal nodularity and enhancement, widespread omental caking, likely malignant ascites and extensive mesenteric lymphadenopathy. The exact source of malignancy is uncertain, however, given the focally thickened loop of small bowel (likely jejunal) in the left side of the central abdomen (image 56 of series 2), a primary small bowel malignancy is suspected. 2. Small bilateral pleural effusions (left greater than right). Right-sided pleural effusion significantly decreased following thoracentesis. Resolving subsegmental atelectasis in the right lower lobe. 3. Additional incidental findings, as above. These results were called by telephone at the time of interpretation on 07/15/2015 at 5:33 pm to Dr. Olevia Bowens, who verbally acknowledged these results.   Electronically Signed   By: Vinnie Langton M.D.   On: 07/15/2015 17:34   Nm Pet Image Initial (pi) Skull Base To Thigh  07/23/2015   CLINICAL DATA:  Initial treatment strategy for high-grade non-Hodgkin's lymphoma. B-cell lymphoma.  EXAM: NUCLEAR MEDICINE PET SKULL BASE TO THIGH  TECHNIQUE: 10.0 mCi F-18 FDG was injected intravenously. Full-ring PET imaging was performed from the skull base to thigh after the radiotracer. CT data was obtained and used for attenuation correction and anatomic localization.  FASTING BLOOD GLUCOSE:  Value: 88 mg/dl  COMPARISON:  CT abdomen 07/15/2015, CT thorax 07/14/2015  FINDINGS: NECK  Small hypermetabolic lymph nodes the just deep to the RIGHT clavicle measure 9 mm short axis on image 54, series 4 with intense metabolic activity (SUV max 6.2).  CHEST  Small  hypermetabolic internal mammary lymph nodes on the RIGHT (image 66 with SUV max equal 5.). Small hypermetabolic nodule posterior to the sternum in the anterior mediastinum on image 69 of fused data set.  Within the precordial space anterior to the liver there is a 4.5 cm hypermetabolic mass. Smaller hypermetabolic nodule anterior to the RIGHT ventricle.  ABDOMEN/PELVIS  There is a thin rim of intense metabolic activity associated entirety of the peritoneal surface consists with peritoneal metastasis / carcinomatosis. There is more focal activity within the LEFT abdomen associated with the small bowel wall thickening. This segmental thickening measures 17 mm in single wall thickness with intense metabolic activity (SUV max 14.6). Central mesenteric mass measures 3 cm with SUV max 13.2.  There are no hypermetabolic inguinal or iliac lymph nodes.  SKELETON  No focal hypermetabolic activity to suggest skeletal metastasis.  IMPRESSION: 1. Thin rim of hypermetabolic activity throughout the peritoneal space consistent with peritoneal carcinomatosis. 2. Focal intense metabolic activity associated with the proximal small bowel consistent with lymphoma involvement of bowel. 3. Hypermetabolic mesenteric nodule  /node consistent with lymphoma. 4. Several small hypermetabolic lymph nodes in the RIGHT supraclavicular and internal mammary nodal stations. 5. Nodal metastasis anterior to the liver in the precordial space. This may be the most assessable for biopsy. 6. Normal spleen and bone marrow.   Electronically Signed   By: Suzy Bouchard M.D.   On: 07/23/2015 11:35   US Biopsy  07/17/2015   CLINICAL DATA:  Extensive peritoneal nodularity and omental caking of uncertain etiology. Periumbilical masses.  EXAM: ULTRASOUND-GUIDED CORE PERIUMBILICAL BIOPSY  TECHNIQUE: An ultrasound guided biopsy was thoroughly discussed with the patient and questions were answered. The benefits, risks, alternatives, and complications  were also  discussed. The patient understands and wishes to proceed with the procedure. A verbal as well as written consent was obtained.  Survey ultrasound of the periumbilical region was performed and an appropriate skin entry site was determined. Skin site was marked, prepped with chlorhexidine, and draped in usual sterile fashion, and infiltrated locally with 1% lidocaine.  Intravenous Fentanyl and Versed were administered as conscious sedation during continuous cardiorespiratory monitoring by the radiology RN, with a total moderate sedation time of less than 30 minutes.  Under real-time ultrasound guidance, a 17 gauge trocar needle was advanced to the margin of the lesion. Once needle tip position was confirmed, coaxial 18-gauge core biopsy samples were obtained, submitted in saline to surgical pathology. The guide needle was removed. Postprocedure scans show no hemorrhage or other apparent complication.  COMPLICATIONS: COMPLICATIONS None immediate  FINDINGS: Nodular periumbilical subcutaneous masses were localized. Core biopsy samples were obtained without complication.  IMPRESSION: 1. Technically successful ultrasound guided core periumbilical nodule biopsy.   Electronically Signed   By: Lucrezia Europe M.D.   On: 07/17/2015 16:15   US Paracentesis  07/17/2015   CLINICAL DATA:  Omental disease and peritoneal nodules.  EXAM: ULTRASOUND GUIDED PARACENTESIS  TECHNIQUE: The procedure, risks (including but not limited to bleeding, infection, organ damage ), benefits, and alternatives were explained to the patient. Questions regarding the procedure were encouraged and answered. The patient understands and consents to the procedure. Survey ultrasound of the abdomen was performed and an appropriate skin entry site in the right lower abdomen was selected. Skin site was marked, prepped with Betadine, and draped in usual sterile fashion, and infiltrated locally with 1% lidocaine. A 5 French multisidehole Yueh sheath needle was  advanced into the peritoneal space until fluid could be aspirated. The sheath was advanced and the needle removed. 1.2 L of cloudy yellow ascites were aspirated. A sample sent to cytology for the requested studies.  COMPLICATIONS: COMPLICATIONS none  IMPRESSION: Technically successful ultrasound guided paracentesis, removing 1.2 L of ascites. Sample to cytology.   Electronically Signed   By: Lucrezia Europe M.D.   On: 07/17/2015 16:17   Ct Biopsy  07/24/2015   CLINICAL DATA:  39 year old male with a new diagnosis of lymphoma.  EXAM: CT BIOPSY  Date: 07/24/2015  PROCEDURE: 1. CT guided bone marrow aspiration and core biopsy Interventional Radiologist:  Criselda Peaches, MD  ANESTHESIA/SEDATION: Moderate (conscious) sedation was used. 2.5 mg Versed, 100 mcg Fentanyl were administered intravenously. The patient's vital signs were monitored continuously by radiology nursing throughout the procedure.  Sedation Time: 9 minutes  FLUOROSCOPY TIME:  None  TECHNIQUE: Informed consent was obtained from the patient following explanation of the procedure, risks, benefits and alternatives. The patient understands, agrees and consents for the procedure. All questions were addressed. A time out was performed.  The patient was positioned prone and noncontrast localization CT was performed of the pelvis to demonstrate the iliac marrow spaces.  Maximal barrier sterile technique utilized including caps, mask, sterile gowns, sterile gloves, large sterile drape, hand hygiene, and betadine prep.  Under sterile conditions and local anesthesia, an 11 gauge coaxial bone biopsy needle was advanced into the right iliac marrow space. Needle position was confirmed with CT imaging. Initially, bone marrow aspiration was performed. Next, the 11 gauge outer cannula was utilized to obtain a right iliac bone marrow core biopsy. Needle was removed. Hemostasis was obtained with compression. The patient tolerated the procedure well. Samples were  prepared with the cytotechnologist. No immediate  complications.  IMPRESSION: CT guided right iliac bone marrow aspiration and core biopsy. The On-Control drill was used.  Signed,  Criselda Peaches, MD  [facility]  [facility]   Electronically Signed   By: Jacqulynn Cadet M.D.   On: 07/24/2015 12:32   Dg Chest Port 1 View  07/22/2015   CLINICAL DATA:  Pulmonary edema, hypoxia  EXAM: PORTABLE CHEST - 1 VIEW  COMPARISON:  07/21/2015  FINDINGS: Heart size is normal. Right-sided PICC line in place with tip over the cavoatrial junction. Hazy perihilar rate greater than left basilar airspace opacities are slightly decreased. Trace pleural effusions.  IMPRESSION: Interval slight decrease in hazy perihilar airspace opacities compatible with improving edema.   Electronically Signed   By: Conchita Paris M.D.   On: 07/22/2015 08:10   Dg Chest Port 1 View  07/21/2015   CLINICAL DATA:  Acute respiratory failure with hypoxemia. Nonsmoker.  EXAM: PORTABLE CHEST - 1 VIEW  COMPARISON:  07/20/2015.  FINDINGS: Cardiomediastinal silhouette appears within normal limits for size. Diffuse pulmonary opacities persist but are slightly improved consistent with resolving edema. Decreased BILATERAL pleural effusions. PICC line tip SVC. No pneumothorax.  IMPRESSION: Improving pulmonary edema.   Electronically Signed   By: Staci Righter M.D.   On: 07/21/2015 09:38   Dg Chest Port 1 View  07/20/2015   CLINICAL DATA:  Shortness of Breath  EXAM: PORTABLE CHEST - 1 VIEW  COMPARISON:  July 15, 2015  FINDINGS: There is alveolar edema throughout both lungs. There are small bilateral effusions. Heart is upper normal in size. There is mild pulmonary venous hypertension. No adenopathy.  IMPRESSION: Extensive edema. Question a degree of congestive heart failure versus noncardiogenic edema. Note that cardiac silhouette is unchanged compared to recent prior study. No adenopathy apparent.    Electronically Signed   By: Lowella Grip III M.D.   On: 07/20/2015 15:36   Dg Fluoro Guide Lumbar Puncture  07/24/2015   CLINICAL DATA:  Lymphoma  EXAM: DIAGNOSTIC LUMBAR PUNCTURE UNDER FLUOROSCOPIC GUIDANCE  FLUOROSCOPY TIME:  Radiation Exposure Index (as provided by the fluoroscopic device):  If the device does not provide the exposure index:  Fluoroscopy Time (in minutes and seconds):  13 seconds  Number of Acquired Images:  0  PROCEDURE: Informed consent was obtained from the patient prior to the procedure, including potential complications of headache, allergy, and pain. With the patient prone, the lower back was prepped with Betadine. 1% Lidocaine was used for local anesthesia. Lumbar puncture was performed at the L3-4 level using a 22 gauge needle with return of clear CSF with an opening pressure of 14 cm water. 12 ml of CSF were obtained for laboratory studies. The patient tolerated the procedure well and there were no apparent complications.  IMPRESSION: 1. Technically successful lumbar puncture. 12 cc of clear CSF were obtained for laboratory studies.   Electronically Signed   By: Kerby Moors M.D.   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 ? EBV related given h/o bothersome infection in 2013. PET/CT with significant intra-abdominal involvement, small intestinal involvement, some chest LNadenopathy as noted above. 2) Post-operative hypoxic respiratory failure with CXR showing b/l infiltrates ?Fluid overload - resolved with light diuresis. 2) Spontaneous TLS - uric acid normalized with allopurinol. Some bump in LFts now  improved. Restarted on lower dose of Allopurinol. 3) AKI ?related to contrast/urate nephropathy less likely urinary obstruction. Creatinine has now improved from 1.54 to 1.1 to 0.9 4) Elevated  transaminases - ? Related to allopurinol. Improved today.  5) Elevated free T4 - ? Related to his supplements vs ?thyroiditis vs central hyperthyroidism vs thyroid involvement by same neoplastic-cn process vs paraneoplastic process. Plan -Bone Marrow biopsy and diagnostic LP for completion of staging done today -results pending. Minimal WBC's , nl TP and glucose - no overt CNS involvement but cytology results pending. -continue current dose of allopurinol -restart IVF with bicarb for TLS protection -G-6PD testing since we might need to use rasburicase. -EPOCH-R starting 07/24/2014 with Rituxan day 5. -cbc, phosphorus, magnesium daily, cmp and uric acid twice daily -Rasburicase 60m IVPB daily prn for uric acid >10 -IT Methotrexate Day 1 and Day 5 from cycle 2 or 3 (Started in cycle 3 in the NEJM study) -rpt PET/CT after 2 cycles. -patient has PICC line. Will need to remove prior to discharge [facility]  Will continue to follow daily. Appreciate cares by surgery, PCCM and the hospitalist team. Kindly call if questions.   GSullivan LoneMD MIolaAAHIVMS [address] [facility] LLCCTri Parish Rehabilitation HospitalHematology/Oncology Physician CKaiser [facility] (Office):       [phone](Work cell):  [phone](Fax):           [phone] 07/24/2015 10:49 PM

## 2015-07-24 NOTE — Procedures (Signed)
Informed consent was obtained from the patient prior to the procedure, including potential complications of headache, allergy, and pain. With the patient prone, the lower back was prepped with Betadine. 1% Lidocaine was used for local anesthesia. Lumbar puncture was performed at the [L3-4] level using a [22] gauge needle with return of [clear] CSF with an opening pressure of [14] cm water. [12] ml of CSF were obtained for laboratory studies. The patient tolerated the procedure well and there were no apparent complications.

## 2015-07-24 NOTE — Progress Notes (Signed)
Dr Frederic Jericho paged to inform of Lab report called:WBC's present in CSF/lumbar puncture done this am

## 2015-07-24 NOTE — Discharge Instructions (Signed)
[facility] ______[facility] LAPAROSCOPIC SURGERY: POST OP INSTRUCTIONS Always review your discharge instruction sheet given to you by the facility where your surgery was performed. IF YOU HAVE DISABILITY OR FAMILY LEAVE FORMS, YOU MUST BRING THEM TO THE OFFICE FOR PROCESSING.   DO NOT GIVE THEM TO YOUR DOCTOR.  1. A prescription for pain medication may be given to you upon discharge.  Take your pain medication as prescribed, if needed.  If narcotic pain medicine is not needed, then you may take acetaminophen (Tylenol) or ibuprofen (Advil) as needed. 2. Take your usually prescribed medications unless otherwise directed. 3. If you need a refill on your pain medication, please contact your pharmacy.  They will contact our office to request authorization. Prescriptions will not be filled after 5pm or on week-ends. 4. You should follow a light diet the first few days after arrival home, such as soup and crackers, etc.  Be sure to include lots of fluids daily. 5. Most patients will experience some swelling and bruising in the area of the incisions.  Ice packs will help.  Swelling and bruising can take several days to resolve.  6. It is common to experience some constipation if taking pain medication after surgery.  Increasing fluid intake and taking a stool softener (such as Colace) will usually help or prevent this problem from occurring.  A mild laxative (Milk of Magnesia or Miralax) should be taken according to package instructions if there are no bowel movements after 48 hours. 7. Unless discharge instructions indicate otherwise, you may remove your bandages 24-48 hours after surgery, and you may shower at that time.  You may have steri-strips (small skin tapes) in place directly over the incision.  These strips should be left on the skin for 7-10 days.  If your surgeon used skin glue on the incision, you may shower in 24 hours.  The glue will flake off over the next 2-3 weeks.  Any sutures or  staples will be removed at the office during your follow-up visit. 8. ACTIVITIES:  You may resume regular (light) daily activities beginning the next day--such as daily self-care, walking, climbing stairs--gradually increasing activities as tolerated.  You may have sexual intercourse when it is comfortable.  Refrain from any heavy lifting or straining until approved by your doctor. a. You may drive when you are no longer taking prescription pain medication, you can comfortably wear a seatbelt, and you can safely maneuver your car and apply brakes. b. RETURN TO WORK:  __________________________________________________________ 9. You should see your doctor in the office for a follow-up appointment approximately 2-3 weeks after your surgery.  Make sure that you call for this appointment within a day or two after you arrive home to insure a convenient appointment time. 10. OTHER INSTRUCTIONS: __________________________________________________________________________________________________________________________ __________________________________________________________________________________________________________________________ WHEN TO CALL YOUR DOCTOR: 1. Fever over 101.0 2. Inability to urinate 3. Continued bleeding from incision. 4. Increased pain, redness, or drainage from the incision. 5. Increasing abdominal pain  The clinic staff is available to answer your questions during regular business hours.  Please dont hesitate to call and ask to speak to one of the nurses for clinical concerns.  If you have a medical emergency, go to the nearest emergency room or call 911.  A surgeon from [facility] is always on call at the hospital. [address] [place], [place], [place]  [address] ? [address], [place], [place]   [address] [phone] ? [phone] ? FAX [phone] Web site:  [email or web address] Marrow Aspiration and Bone Biopsy °Examination of the  bone marrow is a valuable test to diagnose blood disorders. A bone marrow biopsy takes a sample of bone and a small amount of fluid and cells from inside the bone. A bone marrow aspiration removes only the marrow. Bone marrow aspiration and bone biopsies are used to stage different disorders of the blood, such as leukemia. Staging will help your caregiver understand how far the disease has progressed.  °The tests are also useful in diagnosing: °· Fever of unknown origin (FUO). °· Bacterial infections and other widespread fungal infections. °· Cancers that have spread (metastasized) to the bone marrow. °· Diseases that are characterized by a deficiency of an enzyme (storage diseases). This includes: °¨ Niemann-Pick disease. °¨ Gaucher disease. °PROCEDURE  °Sites used to get samples include:  °· Back of your hip bone (posterior iliac crest). °¨ Both aspiration and biopsy. °· Front of your hip bone (anterior iliac crest). °¨ Both aspiration and biopsy. °· Breastbone (sternum). °¨ Aspiration from your breastbone (done only in adults). This method is rarely used. °When you get a hip bone aspiration: °· You are placed lying on your side with the upper knee brought up and flexed with the lower leg straight. °· The site is prepared, cleaned with an antiseptic scrub, and draped. This keeps the biopsy area clean. °· The skin and the area down to the lining of the bone (periosteum) are made numb with a local anesthetic. °· The bone marrow aspiration needle is inserted. You will feel pressure on your bone. °· Once inside the marrow cavity, a sample of bone marrow is sucked out (aspirated) for pathology slides. °· The material collected for bone marrow slides is processed immediately by a technologist. °· The technician selects the marrow particles to make the slides for pathology. °· The marrow aspiration needle is removed. Then pressure is applied to the site with gauze until bleeding has stopped. °Following an aspiration, a  bone marrow biopsy may be performed as well. The technique for this is very similar. A dressing is then applied.  °RISKS AND COMPLICATIONS °· The main complications of a bone marrow aspiration and biopsy include infection and bleeding. °· Complications are uncommon. The procedure may not be performed in patients with bleeding tendencies. °· A very rare complication from the procedure is injury to the heart during a breastbone (sternal) marrow aspiration. Only bone marrow aspirations are performed in this area. °· Long-lasting pain at the site of the bone marrow aspiration and biopsy is uncommon. °Your caregiver will let you know when you are to get your results and will discuss them with you. You may make an appointment with your caregiver to find out the results. Do not assume everything is normal if you have not heard from your caregiver or the medical facility. It is important for you to follow up on all of your test results. °Document Released: 10/30/2004 Document Revised: 01/19/2012 Document Reviewed: 10/24/2008 °ExitCare® Patient Information ©2015 [employer]. This information is not intended to replace advice given to you by your health care provider. Make sure you discuss any questions you have with your health care provider. ° °

## 2015-07-25 ENCOUNTER — Other Ambulatory Visit: Payer: Self-pay | Admitting: Hematology

## 2015-07-25 DIAGNOSIS — R7989 Other specified abnormal findings of blood chemistry: Secondary | ICD-10-CM

## 2015-07-25 DIAGNOSIS — E883 Tumor lysis syndrome: Secondary | ICD-10-CM

## 2015-07-25 DIAGNOSIS — Z5111 Encounter for antineoplastic chemotherapy: Secondary | ICD-10-CM

## 2015-07-25 DIAGNOSIS — R945 Abnormal results of liver function studies: Secondary | ICD-10-CM

## 2015-07-25 DIAGNOSIS — C8370 Burkitt lymphoma, unspecified site: Secondary | ICD-10-CM

## 2015-07-25 LAB — MAGNESIUM: MAGNESIUM: 1.8 mg/dL (ref 1.7–2.4)

## 2015-07-25 LAB — GLUCOSE 6 PHOSPHATE DEHYDROGENASE
G6PDH: 9.4 U/g{Hb} (ref 4.6–13.5)
Hemoglobin: 13.5 g/dL (ref 12.6–17.7)

## 2015-07-25 LAB — CBC WITH DIFFERENTIAL/PLATELET
BASOS PCT: 0 %
Basophils Absolute: 0 10*3/uL (ref 0.0–0.1)
EOS PCT: 1 %
Eosinophils Absolute: 0.1 10*3/uL (ref 0.0–0.7)
HEMATOCRIT: 38.6 % — AB (ref 39.0–52.0)
HEMOGLOBIN: 12.9 g/dL — AB (ref 13.0–17.0)
LYMPHS PCT: 25 %
Lymphs Abs: 2.3 10*3/uL (ref 0.7–4.0)
MCH: 28.9 pg (ref 26.0–34.0)
MCHC: 33.4 g/dL (ref 30.0–36.0)
MCV: 86.5 fL (ref 78.0–100.0)
MONO ABS: 1.2 10*3/uL — AB (ref 0.1–1.0)
Monocytes Relative: 13 %
NEUTROS PCT: 61 %
Neutro Abs: 5.7 10*3/uL (ref 1.7–7.7)
Platelets: 235 10*3/uL (ref 150–400)
RBC: 4.46 MIL/uL (ref 4.22–5.81)
RDW: 12.8 % (ref 11.5–15.5)
WBC: 9.3 10*3/uL (ref 4.0–10.5)

## 2015-07-25 LAB — URIC ACID
Uric Acid, Serum: 4.5 mg/dL (ref 4.4–7.6)
Uric Acid, Serum: 4.2 mg/dL — ABNORMAL LOW (ref 4.4–7.6)

## 2015-07-25 LAB — COMPREHENSIVE METABOLIC PANEL
ALBUMIN: 2.7 g/dL — AB (ref 3.5–5.0)
ALBUMIN: 3 g/dL — AB (ref 3.5–5.0)
ALK PHOS: 44 U/L (ref 38–126)
ALK PHOS: 52 U/L (ref 38–126)
ALT: 111 U/L — ABNORMAL HIGH (ref 17–63)
ALT: 123 U/L — AB (ref 17–63)
ANION GAP: 6 (ref 5–15)
AST: 95 U/L — ABNORMAL HIGH (ref 15–41)
AST: 90 U/L — AB (ref 15–41)
Anion gap: 7 (ref 5–15)
BILIRUBIN TOTAL: 0.4 mg/dL (ref 0.3–1.2)
BUN: 17 mg/dL (ref 6–20)
BUN: 20 mg/dL (ref 6–20)
CALCIUM: 7.8 mg/dL — AB (ref 8.9–10.3)
CHLORIDE: 103 mmol/L (ref 101–111)
CO2: 28 mmol/L (ref 22–32)
CO2: 25 mmol/L (ref 22–32)
CREATININE: 0.89 mg/dL (ref 0.61–1.24)
Calcium: 7.7 mg/dL — ABNORMAL LOW (ref 8.9–10.3)
Chloride: 102 mmol/L (ref 101–111)
Creatinine, Ser: 0.83 mg/dL (ref 0.61–1.24)
GFR calc Af Amer: >60 mL/min (ref >60)
GFR calc non Af Amer: >60 mL/min (ref >60)
GFR calc non Af Amer: >60 mL/min (ref >60)
GLUCOSE: 88 mg/dL (ref 65–99)
GLUCOSE: 174 mg/dL — AB (ref 65–99)
Potassium: 3.8 mmol/L (ref 3.5–5.1)
Potassium: 4.5 mmol/L (ref 3.5–5.1)
SODIUM: 137 mmol/L (ref 135–145)
SODIUM: 134 mmol/L — AB (ref 135–145)
TOTAL PROTEIN: 5.5 g/dL — AB (ref 6.5–8.1)
Total Bilirubin: 0.4 mg/dL (ref 0.3–1.2)
Total Protein: 4.9 g/dL — ABNORMAL LOW (ref 6.5–8.1)

## 2015-07-25 LAB — PHOSPHORUS: PHOSPHORUS: 3.6 mg/dL (ref 2.5–4.6)

## 2015-07-25 LAB — CALCIUM, IONIZED: CALCIUM, IONIZED, SERUM: 4.7 mg/dL (ref 4.5–5.6)

## 2015-07-25 MED ORDER — HOT PACK MISC ONCOLOGY
1.0000 | Freq: Once | Status: DC | PRN
  Filled 2015-07-25: qty 1

## 2015-07-25 MED ORDER — PREDNISONE 50 MG PO TABS
120.0000 mg | ORAL_TABLET | Freq: Every day | ORAL | Status: AC
  Administered 2015-07-25 – 2015-07-29 (×5): 120 mg via ORAL
  Filled 2015-07-25: qty 1
  Filled 2015-07-25 (×3): qty 2
  Filled 2015-07-25: qty 1
  Filled 2015-07-25 (×2): qty 2
  Filled 2015-07-25 (×3): qty 1

## 2015-07-25 MED ORDER — HEPARIN SOD (PORK) LOCK FLUSH 100 UNIT/ML IV SOLN: 250.0000 [IU] | Freq: Once | INTRAVENOUS | Status: DC | PRN

## 2015-07-25 MED ORDER — COLD PACK MISC ONCOLOGY
1.0000 | Freq: Once | Status: DC | PRN
  Filled 2015-07-25: qty 1

## 2015-07-25 MED ORDER — SODIUM CHLORIDE 0.9 % IV SOLN
Freq: Once | INTRAVENOUS | Status: AC
  Administered 2015-07-25: 18 mg via INTRAVENOUS
  Filled 2015-07-25: qty 4

## 2015-07-25 MED ORDER — ALTEPLASE 2 MG IJ SOLR
2.0000 mg | Freq: Once | INTRAMUSCULAR | Status: DC | PRN
  Filled 2015-07-25: qty 2

## 2015-07-25 MED ORDER — SODIUM CHLORIDE 0.9 % IJ SOLN: 3.0000 mL | INTRAMUSCULAR | Status: DC | PRN

## 2015-07-25 MED ORDER — SODIUM CHLORIDE 0.9 % IJ SOLN: 10.0000 mL | INTRAMUSCULAR | Status: DC | PRN

## 2015-07-25 MED ORDER — SODIUM CHLORIDE 0.9 % IV SOLN
INTRAVENOUS | Status: DC
  Administered 2015-07-25: 11:00:00 via INTRAVENOUS

## 2015-07-25 MED ORDER — VINCRISTINE SULFATE CHEMO INJECTION 1 MG/ML
Freq: Once | INTRAVENOUS | Status: AC
  Administered 2015-07-25: 12:00:00 via INTRAVENOUS
  Filled 2015-07-25: qty 11

## 2015-07-25 MED ORDER — HEPARIN SOD (PORK) LOCK FLUSH 100 UNIT/ML IV SOLN: 500.0000 [IU] | Freq: Once | INTRAVENOUS | Status: DC | PRN

## 2015-07-25 NOTE — Progress Notes (Signed)
Manual calculation of BSA and dosing for Doxorubicin, Etoposide, and Vincristine completed.  Secondary verification by Laural Benes, RN

## 2015-07-25 NOTE — Progress Notes (Signed)
[facility] PROGRESS NOTE  Donald [name] ZSW:[id number] DOB: 1976-08-21 DOA: 07/14/2015 PCP: Drema Pry, DO  brief narrative 39 year old male with no medical history presented with 2 weeks of back pain while riding his bike. He had a chest x-ray done as outpatient which suggested a possible infection and was given antibiotic. However patient returned to the ED as he had right chest pain and was short of breath. In the ED was found to have a large right-sided pleural effusion and was admitted for further workup. Patient transferred to [facility] for excisional core biopsy.  Assessment/Plan: Burkitt B cell Lymphoma:  Pleural effusion, right/abdominal mass suggestive of B cell lymphoma: -CT scan of the chest with contrast: -  significant effusion with compression of the right lung. -CT scan of the abdomen :- peritoneal carcinomatosis with no lymphadenopathy -right  Thoracocentesis suggests exudate. -oncology following. Started on decadron on 9/10 after laparoscopic excisional biopsy on 9/9. PET scan  9/12. -PICC line placed on 9/9 -[place] core biopsy of omental mass and diagnostic/therapeutic paracentesis, pending pathology. tissue flow cytometry suggests proliferative B cell. -plan to start inpatient treatment. -SP LP and BM biopsy 9-13. No malignant cell CSF/  Started on chemo. Monitor for adverse reaction.   Postop Acute Hypoxemic Respiratory Failure on 9/9 Patient extubated and placed on nonrebreather. Given 10 mg IV Lasix. Lactic acid mildly elevated. Chest x-ray shows extensive pulmonary edema. Discontinued IV fluids. Received 2 doses of IV lasix ( total 40 mg on 9/10). Currently maintaining sats on 2.5 L via nasal cannula.  Continue incentive spirometry. Repeat chest x-ray showed improving pulmonary edema. Received one time dose of lasix 9-11--9-12.  Weight at 90 Kg. Hold on lasix doses. Follow clinically.  Monitor on chemo. If respiratory distress might need lasix, chest x ray.    Acute kidney injury -possibly due to tumor lysis syndrome and IV contrast -Renal function back to normal.  Tumor lysis syndrome: -Resumed allopurinol since LFTs improved. -Renal function improving and uric acid normal. Stopped  IV fluids due to fluid overload.  Hypokalemia Resolved.   GERD Added PPI twice a day and Maalox. Symptoms improved.  Elevated TSH and free T4 No clear etiology.? Thyroiditis versus secondary hyper thyroidism. Follow thyroglobulin antibody and stimulating immunoglobulin.  Screening process: -non reactive HIV -CMV IGM and antibody neg -EBV results (DNA by PCR and VCA antibody) negative.  -ESR 6 -CRP 4.3 -hepatitis panel neg -AFP 4.5 -HCG tumor marker < 1     DVT prophylaxis: sq heparin  Diet: Regular   Consultants:  [facility] Oncology ( Dr Irene Limbo) [facility]  Procedures:  Thoracocentesis  Paracentesis  laproscopy abdominal biopsy  Antibiotics:  None  CODE STATUS: Full code Family communication: None at bedside today.  Disposition: start chemotherapy  HPI/Subjective: He is feeling well, denies dyspnea.   Objective: Filed Vitals:   07/25/15 0701  BP: 114/60  Pulse: 52  Temp: 97.8 F (36.6 C)  Resp: 16    Intake/Output Summary (Last 24 hours) at 07/25/15 1511 Last data filed at 07/25/15 0710  Gross per 24 hour  Intake    600 ml  Output    350 ml  Net    250 ml   Filed Weights   07/22/15 1100 07/23/15 1635 07/24/15 0500  Weight: 91.9 kg (202 lb 9.6 oz) 90.719 kg (200 lb) 90.719 kg (200 lb)    Exam:   General:  Middle aged male in no acute distress  Chest: crackles base, more pronounced on the right.   CVS:  Normal S1 and S2, no murmurs  GI: Soft, abdominal distention, nontender, bowel sounds present  Musculoskeletal: Warm, no edema  CNS: Alert and oriented  Data Reviewed: Basic Metabolic Panel:  Recent Labs Lab 07/20/15 0407  07/20/15 1815  07/21/15 1800 07/22/15 0920 07/23/15 0455  07/24/15 0500 07/25/15 0550 07/25/15 0800  NA 138  < >  --   < > 138 138 138 138  --  137  K 4.2  < >  --   < > 3.7 3.3* 4.0 4.1  --  3.8  CL 102  < >  --   < > 98* 100* 101 103  --  103  CO2 21*  < >  --   < > 31 30 29 27   --  28  GLUCOSE 76  < >  --   < > 146* 159* 109* 96  --  88  BUN 22*  < >  --   < > 20 18 20  21*  --  17  CREATININE 1.54*  < >  --   < > 0.97 0.87 0.92 0.77  --  0.83  CALCIUM 7.5*  < >  --   < > 7.9* 7.7* 7.8* 8.1*  --  7.7*  MG 1.8  --   --   --   --   --   --   --  1.8  --   PHOS 5.8*  --  6.0*  --  3.7  --   --  4.7* 3.6  --   < > = values in this interval not displayed. Liver Function Tests:  Recent Labs Lab 07/20/15 1646 07/21/15 0538 07/21/15 1800 07/24/15 0500 07/25/15 0800  AST 76* 63* 76* 88* 95*  ALT 59 53 61 97* 111*  ALKPHOS 43 35* 40 45 44  BILITOT 1.2 0.6 0.6 0.7 0.4  PROT 5.2* 5.1* 5.5* 5.5* 4.9*  ALBUMIN 2.8* 2.7* 2.9* 2.9* 2.7*   No results for input(s): LIPASE, AMYLASE in the last 168 hours. No results for input(s): AMMONIA in the last 168 hours. CBC:  Recent Labs Lab 07/19/15 1019 07/20/15 0407 07/20/15 1646 07/24/15 0500 07/25/15 0550  WBC 8.5 9.4 9.8 8.3 9.3  NEUTROABS 5.9 5.8 8.6* 5.5 5.7  HGB 16.0 14.7 14.1 12.8* 12.9*  HCT 46.7 43.1 41.9 38.1* 38.6*  MCV 85.7 86.4 86.9 87.0 86.5  PLT 210 229 231 216 235   Cardiac Enzymes:  Recent Labs Lab 07/21/15 0538  TROPONINI <0.03   BNP (last 3 results)  Recent Labs  07/20/15 1646  BNP 13.9    ProBNP (last 3 results) No results for input(s): PROBNP in the last 8760 hours.  CBG:  Recent Labs Lab 07/23/15 0908  GLUCAP 88    Recent Results (from the past 240 hour(s))  AFB culture with smear     Status: None (Preliminary result)   Collection Time: 07/15/15  3:23 PM  Result Value Ref Range Status   Specimen Description PLEURAL RIGHT FLUID  Final   Special Requests PLEURAL RIGHT FLUID  Final   Acid Fast Smear   Final    NO ACID FAST BACILLI SEEN Performed  at [facility]    Culture   Final    CULTURE WILL BE EXAMINED FOR 6 WEEKS BEFORE ISSUING A FINAL REPORT Performed at [facility]    Report Status PENDING  Incomplete  Fungus Culture with Smear     Status: None (Preliminary result)   Collection Time: 07/15/15  3:23 PM  Result Value Ref Range Status   Specimen Description PLEURAL RIGHT FLUID  Final   Special Requests PLEURAL RIGHT FLUID  Final   Fungal Smear   Final    NO YEAST OR FUNGAL ELEMENTS SEEN Performed at [facility]    Culture   Final    CULTURE IN PROGRESS FOR FOUR WEEKS Performed at [facility]    Report Status PENDING  Incomplete  Culture, body fluid-bottle     Status: None   Collection Time: 07/15/15  3:23 PM  Result Value Ref Range Status   Specimen Description FLUID PLEURAL  Final   Special Requests BOTTLES DRAWN AEROBIC AND ANAEROBIC 5CC  Final   Culture NO GROWTH 5 DAYS  Final   Report Status 07/20/2015 FINAL  Final  Gram stain     Status: None   Collection Time: 07/15/15  3:23 PM  Result Value Ref Range Status   Specimen Description FLUID PLEURAL  Final   Special Requests NONE  Final   Gram Stain   Final    ABUNDANT WBC PRESENT,BOTH PMN AND MONONUCLEAR NO ORGANISMS SEEN GRAM STAIN REVIEWED-AGREE WITH RESULT K WOOLEN    Report Status 07/15/2015 FINAL  Final  Surgical pcr screen     Status: None   Collection Time: 07/19/15 10:12 PM  Result Value Ref Range Status   MRSA, PCR NEGATIVE NEGATIVE Final   Staphylococcus aureus NEGATIVE NEGATIVE Final    Comment:        The Xpert SA Assay (FDA approved for NASAL specimens in patients over 22 years of age), is one component of a comprehensive surveillance program.  Test performance has been validated by [facility] for patients greater than or equal to 8 year old. It is not intended to diagnose infection nor to guide or monitor treatment.   CSF culture     Status: None (Preliminary result)   Collection Time:  07/24/15 11:46 AM  Result Value Ref Range Status   Specimen Description CSF  Final   Special Requests NONE  Final   Gram Stain   Final    WBC PRESENT, PREDOMINANTLY MONONUCLEAR NO ORGANISMS SEEN CYTOSPIN Gram Stain Report Called to,Read Back By and Verified With: BERGER,J. RN @1449  ON 9.13.16 BY MCCOY,N.    Culture   Final    NO GROWTH < 24 HOURS Performed at [facility]    Report Status PENDING  Incomplete     Studies: Ct Biopsy  07/24/2015   CLINICAL DATA:  39 year old male with a new diagnosis of lymphoma.  EXAM: CT BIOPSY  Date: 07/24/2015  PROCEDURE: 1. CT guided bone marrow aspiration and core biopsy Interventional Radiologist:  Criselda Peaches, MD  ANESTHESIA/SEDATION: Moderate (conscious) sedation was used. 2.5 mg Versed, 100 mcg Fentanyl were administered intravenously. The patient's vital signs were monitored continuously by radiology nursing throughout the procedure.  Sedation Time: 9 minutes  FLUOROSCOPY TIME:  None  TECHNIQUE: Informed consent was obtained from the patient following explanation of the procedure, risks, benefits and alternatives. The patient understands, agrees and consents for the procedure. All questions were addressed. A time out was performed.  The patient was positioned prone and noncontrast localization CT was performed of the pelvis to demonstrate the iliac marrow spaces.  Maximal barrier sterile technique utilized including caps, mask, sterile gowns, sterile gloves, large sterile drape, hand hygiene, and betadine prep.  Under sterile conditions and local anesthesia, an 11 gauge coaxial bone biopsy needle was advanced into the right  iliac marrow space. Needle position was confirmed with CT imaging. Initially, bone marrow aspiration was performed. Next, the 11 gauge outer cannula was utilized to obtain a right iliac bone marrow core biopsy. Needle was removed. Hemostasis was obtained with compression. The patient tolerated the procedure well. Samples  were prepared with the cytotechnologist. No immediate complications.  IMPRESSION: CT guided right iliac bone marrow aspiration and core biopsy. The On-Control drill was used.  Signed,  Criselda Peaches, MD  [facility]  [facility]   Electronically Signed   By: Jacqulynn Cadet M.D.   On: 07/24/2015 12:32   Dg Fluoro Guide Lumbar Puncture  07/24/2015   CLINICAL DATA:  Lymphoma  EXAM: DIAGNOSTIC LUMBAR PUNCTURE UNDER FLUOROSCOPIC GUIDANCE  FLUOROSCOPY TIME:  Radiation Exposure Index (as provided by the fluoroscopic device):  If the device does not provide the exposure index:  Fluoroscopy Time (in minutes and seconds):  13 seconds  Number of Acquired Images:  0  PROCEDURE: Informed consent was obtained from the patient prior to the procedure, including potential complications of headache, allergy, and pain. With the patient prone, the lower back was prepped with Betadine. 1% Lidocaine was used for local anesthesia. Lumbar puncture was performed at the L3-4 level using a 22 gauge needle with return of clear CSF with an opening pressure of 14 cm water. 12 ml of CSF were obtained for laboratory studies. The patient tolerated the procedure well and there were no apparent complications.  IMPRESSION: 1. Technically successful lumbar puncture. 12 cc of clear CSF were obtained for laboratory studies.   Electronically Signed   By: Kerby Moors M.D.   On: 07/24/2015 12:31    Scheduled Meds: . allopurinol  150 mg Oral BID  . DOXOrubicin/vinCRIStine/etoposide CHEMO IV infusion for Inpatient CI   Intravenous Once  . enoxaparin (LOVENOX) injection  40 mg Subcutaneous Q24H  . pantoprazole  40 mg Oral BID AC  . predniSONE  120 mg Oral QAC breakfast  . senna-docusate  2 tablet Oral QHS  . sodium chloride  10-40 mL Intracatheter Q12H   Continuous Infusions: . sodium chloride 10 mL/hr at 07/15/15 1023  . sodium chloride 10 mL/hr at 07/25/15 1056  .  sodium  bicarbonate  infusion 1000 mL 75 mL/hr at 07/25/15 1154     Time spent: 25 minutes    [place], [facility] Pager [phone] If 7PM-7AM, please contact night-coverage at [email or web address], password [facility] 07/25/2015, 3:11 PM  LOS: 11 days

## 2015-07-25 NOTE — Progress Notes (Signed)
[name]   HEMATOLOGY/ONCOLOGY INPATIENT PROGRESS NOTE  Date of Service: 07/25/2015  Inpatient Attending: .Elmarie Shiley, MD   SUBJECTIVE  Patient seen this afternoon. Donald [name] was sitting comfortably in bed doing his work on the computer. Tolerating his chemotherapy well thus far.  No nausea or vomiting.  Abdomen stable increasing pain.  He notes that the high dose of prednisone did bother his stomach little bit. No other acute new symptoms.  Breathing stable.  Eating well. Labs do not show overt signs of worsening tumor lysis at this time.  OBJECTIVE:  PHYSICAL EXAMINATION: . Filed Vitals:   07/24/15 2131 07/25/15 0701 07/25/15 1523 07/25/15 2100  BP: 120/64 114/60 120/68 110/48  Pulse: 51 52 60 58  Temp: 97.8 F (36.6 C) 97.8 F (36.6 C) 98.2 F (36.8 C) 98.1 F (36.7 C)  TempSrc: Oral Oral Oral Oral  Resp: _0 Height:      Weight:      SpO2: 96% 95% 96% 95%   Filed Weights   07/22/15 1100 07/23/15 1635 07/24/15 0500  Weight: 202 lb 9.6 oz (91.9 kg) 200 lb (90.719 kg) 200 lb (90.719 kg)   .Body mass index is 27.91 kg/(m^2).  GENERAL: SKIN:no acute rashes. EYES: normal, conjunctiva are pink and non-injected, sclera clear OROPHARYNX:no exudate, no erythema and lips, buccal mucosa, and tongue normal  NECK: supple, no JVD, thyroid normal size, non-tender, without nodularity LYMPH:  no palpable lymphadenopathy in the cervical, axillary or inguinal LUNGS:Decreased breath sounds rt base. No overt rales noted. HEART: regular rate & rhythm,  no murmurs and trace lower extremity edema ABDOMEN: abdomen mildly distended, BS normoactive, laparoscopic incisions clean. PSYCH: alert & oriented x 3 with fluent speech NEURO: no focal motor/sensory deficits  MEDICAL HISTORY:  Past Medical History  Diagnosis Date  . EBV infection 2013    Patient notes that he had an EBV infection in 2013 that left him with chronic fatigue. She also notes that he had significant  lymphadenopathy and thyroiditis at the time. He has been managing his symptoms with an alternative medicine practitioner in [place] and with other alternative medicines.  . Marijuana use, episodic     SURGICAL HISTORY: Past Surgical History  Procedure Laterality Date  . Tonsillectomy    . Appendectomy    . Laparoscopy N/A 07/20/2015    Procedure: LAPAROSCOPY DIAGNOSTIC, MULTIPLE BIOPSIES, DRAINAGE OF ASCITES;  Surgeon: Fanny Skates, MD;  Location: [facility];  Service: General;  Laterality: N/A;    SOCIAL HISTORY: Social History   Social History  . Marital Status: Married    Spouse Name: N/A  . Number of Children: N/A  . Years of Education: N/A   Occupational History  . Not on file.   Social History Main Topics  . Smoking status: Never Smoker   . Smokeless tobacco: Not on file  . Alcohol Use: No  . Drug Use: 1.00 per week    Special: Marijuana  . Sexual Activity: Not on file   Other Topics Concern  . 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   He has a fiance and has been in a steady relationship.      He has tried to maintain a very healthy lifestyle and cycles about 100 miles a week. He also uses a fair number of over-the-counter alternative medicines to stay healthy.    FAMILY HISTORY: Family History  Problem  Relation Age of Onset  . Heart failure Father     ALLERGIES:  is allergic to fluoride preparations and sulfa antibiotics.  MEDICATIONS:  Scheduled Meds: . allopurinol  150 mg Oral BID  . DOXOrubicin/vinCRIStine/etoposide CHEMO IV infusion for Inpatient CI   Intravenous Once  . enoxaparin (LOVENOX) injection  40 mg Subcutaneous Q24H  . pantoprazole  40 mg Oral BID AC  . predniSONE  120 mg Oral QAC breakfast  . senna-docusate  2 tablet Oral QHS  . sodium chloride  10-40 mL Intracatheter Q12H   Continuous Infusions: . sodium chloride 10 mL/hr at 07/15/15  1023  . sodium chloride 10 mL/hr at 07/25/15 1056  .  sodium bicarbonate  infusion 1000 mL 75 mL/hr at 07/25/15 1154   PRN Meds:.alteplase, Cold Pack, fludeoxyglucose F - 18, gi cocktail, heparin lock flush, heparin lock flush, Hot Pack, HYDROcodone-acetaminophen, LORazepam, morphine injection, ondansetron (ZOFRAN) IV, rasburicase (ELITEK) IV infusion, sodium chloride, sodium chloride, sodium chloride  REVIEW OF SYSTEMS:    10 Point review of Systems was done is negative except as noted above.   LABORATORY DATA:  I have reviewed the data as listed  . CBC Latest Ref Rng 07/25/2015 07/24/2015 07/20/2015  WBC 4.0 - 10.5 K/uL 9.3 8.3 9.8  Hemoglobin 13.0 - 17.0 g/dL 12.9(L) 12.8(L) 14.1  Hematocrit 39.0 - 52.0 % 38.6(L) 38.1(L) 41.9  Platelets 150 - 400 K/uL 235 216 231   . CBC    Component Value Date/Time   WBC 9.3 07/25/2015 0550   RBC 4.46 07/25/2015 0550   RBC 5.39 07/18/2015 0920   HGB 12.9* 07/25/2015 0550   HCT 38.6* 07/25/2015 0550   PLT 235 07/25/2015 0550   MCV 86.5 07/25/2015 0550   MCH 28.9 07/25/2015 0550   MCHC 33.4 07/25/2015 0550   RDW 12.8 07/25/2015 0550   LYMPHSABS 2.3 07/25/2015 0550   MONOABS 1.2* 07/25/2015 0550   EOSABS 0.1 07/25/2015 0550   BASOSABS 0.0 07/25/2015 0550      . CMP Latest Ref Rng 07/25/2015 07/25/2015 07/24/2015  Glucose 65 - 99 mg/dL 174(H) 88 96  BUN 6 - 20 mg/dL 20 17 21(H)  Creatinine 0.61 - 1.24 mg/dL 0.89 0.83 0.77  Sodium 135 - 145 mmol/L 134(L) 137 138  Potassium 3.5 - 5.1 mmol/L 4.5 3.8 4.1  Chloride 101 - 111 mmol/L 102 103 103  CO2 22 - 32 mmol/L _0 Calcium 8.9 - 10.3 mg/dL 7.8(L) 7.7(L) 8.1(L)  Total Protein 6.5 - 8.1 g/dL 5.5(L) 4.9(L) 5.5(L)  Total Bilirubin 0.3 - 1.2 mg/dL 0.4 0.4 0.7  Alkaline Phos 38 - 126 U/L 52 44 45  AST 15 - 41 U/L 90(H) 95(H) 88(H)  ALT 17 - 63 U/L 123(H) 111(H) 97(H)   Component     Latest Ref Rng 07/24/2015  Tube #      1  Color, CSF     COLORLESS COLORLESS  Appearance, CSF      CLEAR CLEAR (A)  Supernatant      NOT INDICATED  RBC Count, CSF     0 /cu mm 2 (H)  WBC, CSF     0 - 5 /cu mm 1  Other Cells, CSF      TOO FEW TO COUNT, SMEAR AVAILABLE FOR REVIEW  Specimen Description      CSF  Special Requests      NONE  Gram Stain      WBC PRESENT, PREDOMINANTLY MONONUCLEAR . . .  Culture  PENDING  Report Status      PENDING  Glucose, CSF     40 - 70 mg/dL 59  Total  Protein, CSF     15 - 45 mg/dL 15     RADIOGRAPHIC STUDIES: I have personally reviewed the radiological images as listed and agreed with the findings in the report. Dg Chest 2 View  07/15/2015   CLINICAL DATA:  Followup right pleural effusion, status post right thoracentesis.  EXAM: CHEST  2 VIEW  COMPARISON:  Yesterday.  FINDINGS: Stable right upper companion rib shadows. No pneumothorax seen. Significantly decreased right pleural fluid. The interstitial markings remain mildly prominent. Normal sized heart. Interval mild left lower lobe linear density. Unremarkable bones.  IMPRESSION: 1. No pneumothorax following right thoracentesis. 2. Significantly less right pleural fluid. 3. Interval mild left lower lobe atelectasis. 4. Stable mild chronic interstitial lung disease.   Electronically Signed   By: Claudie Revering M.D.   On: 07/15/2015 17:21   Dg Chest 2 View  07/14/2015   CLINICAL DATA:  Right-sided chest pain for 2-3 days. Shortness of breath.  EXAM: CHEST  2 VIEW  COMPARISON:  07/12/2015  FINDINGS: Heart size is probably normal. Suspect lung opacity at the right base in addition to the elevated hemidiaphragm. Opacity at the right lung base may be related to subpulmonic effusion. Overall the appearance is stable since previous exam. There is a trace left pleural effusion. No pulmonary edema.  IMPRESSION: Persistent significant opacity at the right lung base. Consider right lateral decubitus view to determine component of layering pleural effusion.   Electronically Signed   By: Nolon Nations M.D.    On: 07/14/2015 16:56   Dg Chest 2 View  07/12/2015   CLINICAL DATA:  Anterior chest pain for 1 week with cough  EXAM: CHEST  2 VIEW  COMPARISON:  None.  FINDINGS: There is mild elevation the right hemidiaphragm. There is evidence suggesting consolidation in the medial right base. Lungs elsewhere clear. Heart size and pulmonary vascularity are normal. No adenopathy. No bone lesions.  IMPRESSION: Evidence of a degree of consolidation in the medial right base. There is elevation of the right hemidiaphragm. Lungs elsewhere clear. Cardiac silhouette within normal limits.  Followup PA and lateral chest radiographs recommended in 3-4 weeks following trial of antibiotic therapy to ensure resolution and exclude underlying malignancy.   Electronically Signed   By: Lowella Grip III M.D.   On: 07/12/2015 15:52   Ct Angio Chest Pe W/cm &/or Wo Cm  07/14/2015   CLINICAL DATA:  Right chest pain  EXAM: CT ANGIOGRAPHY CHEST WITH CONTRAST  TECHNIQUE: Multidetector CT imaging of the chest was performed using the standard protocol during bolus administration of intravenous contrast. Multiplanar CT image reconstructions and MIPs were obtained to evaluate the vascular anatomy.  CONTRAST:  165m OMNIPAQUE IOHEXOL 350 MG/ML SOLN  COMPARISON:  Chest x-ray 07/14/2015  FINDINGS: Negative for pulmonary embolism. Negative for aortic dissection or aneurysm  Moderately large right pleural effusion with compressive atelectasis of the right lower lobe. Atelectasis also present in the right middle lobe. Partial collapse of the right upper lobe medially adjacent to the mediastinum. This extends into the right upper lobe laterally. Small left pleural effusion. No significant infiltrate on the left.  No adenopathy is present in the mediastinum.  No acute bony abnormality  In the upper abdomen, there is mild to moderate ascites. This could be due to liver disease. There is fluid around the liver and spleen. The spleen  is not enlarged. Limited  imaging of the pancreas and kidneys are grossly normal. There is soft tissue infiltration in the omentum. Carcinoma not excluded.  Review of the MIP images confirms the above findings.  IMPRESSION: Negative for pulmonary embolism  Moderate right pleural effusion with compressive atelectasis in the right lower lobe. Additional atelectasis in the right middle lobe and right upper lobe. No definite mass or pneumonia however infection and tumor are in the differential. There is a small left effusion  Concerning findings in the abdomen. There is ascites around the liver and spleen. There is soft tissue density involving the omentum which can be seen with carcinoma. Further imaging of the abdomen with CT abdomen pelvis with contrast is suggested.   Electronically Signed   By: Franchot Gallo M.D.   On: 07/14/2015 18:28   Ct Abdomen Pelvis W Contrast  07/15/2015   CLINICAL DATA:  39 year old male with ascites and potential omental disease noted on prior chest CT. Follow-up abdominal CT scan.  EXAM: CT ABDOMEN AND PELVIS WITH CONTRAST  TECHNIQUE: Multidetector CT imaging of the abdomen and pelvis was performed using the standard protocol following bolus administration of intravenous contrast.  CONTRAST:  164m OMNIPAQUE IOHEXOL 300 MG/ML  SOLN  COMPARISON:  CT of the chest 07/14/2015. CT of the pelvis 03/03/2008.  FINDINGS: Lower chest: Small bilateral pleural effusions (left greater than right). Subsegmental atelectasis in the posterior aspect of the right lower lobe.  Hepatobiliary: No cystic or solid hepatic lesion identified. No intra or extrahepatic biliary ductal dilatation. High attenuation material filling the gallbladder, presumably vicarious excretion of contrast material from the recent chest CT.  Pancreas: No pancreatic mass. No pancreatic ductal dilatation. No pancreatic or peripancreatic fluid or inflammatory changes.  Spleen: Unremarkable.  Adrenals/Urinary Tract: Bilateral adrenal glands and bilateral  kidneys are unremarkable in appearance. No hydroureteronephrosis. Urinary bladder is unremarkable in appearance.  Stomach/Bowel: In the mid small bowel (like mid to distal jejunum) there is a profoundly asymmetrically thickened loop of bowel best demonstrated on image 56 of series 2, where the wall measures up to 22 mm in thickness medially. Adjacent to this there are soft tissue masses extending into the root of the small bowel mesentery, largest of which measures 2.7 x 3.3 cm (image 47 of series 2), likely malignant lymph nodes. The appendix is not confidently identified. No pathologic dilatation of small bowel or colon. Stomach is grossly normal in appearance.  Vascular/Lymphatic: No significant atherosclerotic disease, aneurysm or dissection identified in the abdominal or pelvic vasculature. Circumaortic left renal vein (normal anatomical variant) incidentally noted. Probable mesenteric lymphadenopathy, as discussed above.  Reproductive: Prostate gland and seminal vesicles are unremarkable in appearance. Inguinal canals and visualized portions of the scrotum were grossly unremarkable in appearance. No lymphadenopathy noted along the gonadal drainage system.  Other: Large amount of abnormal enhancing soft tissue throughout the peritoneal cavity, most notable in the omentum, particularly in the right upper quadrant where there is bulky omental caking. Small volume of presumably malignant ascites. No pneumoperitoneum. Some abnormal soft tissue is extending through the anterior abdominal wall into the periumbilical region (image 53 of series 2).  Musculoskeletal: There are no aggressive appearing lytic or blastic lesions noted in the visualized portions of the skeleton.  IMPRESSION: 1. Widespread intraperitoneal malignancy, as demonstrated by extensive peritoneal nodularity and enhancement, widespread omental caking, likely malignant ascites and extensive mesenteric lymphadenopathy. The exact source of malignancy is  uncertain, however, given the focally thickened loop of small bowel (likely jejunal)  in the left side of the central abdomen (image 56 of series 2), a primary small bowel malignancy is suspected. 2. Small bilateral pleural effusions (left greater than right). Right-sided pleural effusion significantly decreased following thoracentesis. Resolving subsegmental atelectasis in the right lower lobe. 3. Additional incidental findings, as above. These results were called by telephone at the time of interpretation on 07/15/2015 at 5:33 pm to Dr. Olevia Bowens, who verbally acknowledged these results.   Electronically Signed   By: Vinnie Langton M.D.   On: 07/15/2015 17:34   Nm Pet Image Initial (pi) Skull Base To Thigh  07/23/2015   CLINICAL DATA:  Initial treatment strategy for high-grade non-Hodgkin's lymphoma. B-cell lymphoma.  EXAM: NUCLEAR MEDICINE PET SKULL BASE TO THIGH  TECHNIQUE: 10.0 mCi F-18 FDG was injected intravenously. Full-ring PET imaging was performed from the skull base to thigh after the radiotracer. CT data was obtained and used for attenuation correction and anatomic localization.  FASTING BLOOD GLUCOSE:  Value: 88 mg/dl  COMPARISON:  CT abdomen 07/15/2015, CT thorax 07/14/2015  FINDINGS: NECK  Small hypermetabolic lymph nodes the just deep to the RIGHT clavicle measure 9 mm short axis on image 54, series 4 with intense metabolic activity (SUV max 6.2).  CHEST  Small hypermetabolic internal mammary lymph nodes on the RIGHT (image 66 with SUV max equal 5.). Small hypermetabolic nodule posterior to the sternum in the anterior mediastinum on image 69 of fused data set.  Within the precordial space anterior to the liver there is a 4.5 cm hypermetabolic mass. Smaller hypermetabolic nodule anterior to the RIGHT ventricle.  ABDOMEN/PELVIS  There is a thin rim of intense metabolic activity associated entirety of the peritoneal surface consists with peritoneal metastasis / carcinomatosis. There is more focal  activity within the LEFT abdomen associated with the small bowel wall thickening. This segmental thickening measures 17 mm in single wall thickness with intense metabolic activity (SUV max 14.6). Central mesenteric mass measures 3 cm with SUV max 13.2.  There are no hypermetabolic inguinal or iliac lymph nodes.  SKELETON  No focal hypermetabolic activity to suggest skeletal metastasis.  IMPRESSION: 1. Thin rim of hypermetabolic activity throughout the peritoneal space consistent with peritoneal carcinomatosis. 2. Focal intense metabolic activity associated with the proximal small bowel consistent with lymphoma involvement of bowel. 3. Hypermetabolic mesenteric nodule  /node consistent with lymphoma. 4. Several small hypermetabolic lymph nodes in the RIGHT supraclavicular and internal mammary nodal stations. 5. Nodal metastasis anterior to the liver in the precordial space. This may be the most assessable for biopsy. 6. Normal spleen and bone marrow.   Electronically Signed   By: Suzy Bouchard M.D.   On: 07/23/2015 11:35   US Biopsy  07/17/2015   CLINICAL DATA:  Extensive peritoneal nodularity and omental caking of uncertain etiology. Periumbilical masses.  EXAM: ULTRASOUND-GUIDED CORE PERIUMBILICAL BIOPSY  TECHNIQUE: An ultrasound guided biopsy was thoroughly discussed with the patient and questions were answered. The benefits, risks, alternatives, and complications were also discussed. The patient understands and wishes to proceed with the procedure. A verbal as well as written consent was obtained.  Survey ultrasound of the periumbilical region was performed and an appropriate skin entry site was determined. Skin site was marked, prepped with chlorhexidine, and draped in usual sterile fashion, and infiltrated locally with 1% lidocaine.  Intravenous Fentanyl and Versed were administered as conscious sedation during continuous cardiorespiratory monitoring by the radiology RN, with a total moderate sedation time  of less than 30 minutes.  Under real-time ultrasound  guidance, a 17 gauge trocar needle was advanced to the margin of the lesion. Once needle tip position was confirmed, coaxial 18-gauge core biopsy samples were obtained, submitted in saline to surgical pathology. The guide needle was removed. Postprocedure scans show no hemorrhage or other apparent complication.  COMPLICATIONS: COMPLICATIONS None immediate  FINDINGS: Nodular periumbilical subcutaneous masses were localized. Core biopsy samples were obtained without complication.  IMPRESSION: 1. Technically successful ultrasound guided core periumbilical nodule biopsy.   Electronically Signed   By: Lucrezia Europe M.D.   On: 07/17/2015 16:15   US Paracentesis  07/17/2015   CLINICAL DATA:  Omental disease and peritoneal nodules.  EXAM: ULTRASOUND GUIDED PARACENTESIS  TECHNIQUE: The procedure, risks (including but not limited to bleeding, infection, organ damage ), benefits, and alternatives were explained to the patient. Questions regarding the procedure were encouraged and answered. The patient understands and consents to the procedure. Survey ultrasound of the abdomen was performed and an appropriate skin entry site in the right lower abdomen was selected. Skin site was marked, prepped with Betadine, and draped in usual sterile fashion, and infiltrated locally with 1% lidocaine. A 5 French multisidehole Yueh sheath needle was advanced into the peritoneal space until fluid could be aspirated. The sheath was advanced and the needle removed. 1.2 L of cloudy yellow ascites were aspirated. A sample sent to cytology for the requested studies.  COMPLICATIONS: COMPLICATIONS none  IMPRESSION: Technically successful ultrasound guided paracentesis, removing 1.2 L of ascites. Sample to cytology.   Electronically Signed   By: Lucrezia Europe M.D.   On: 07/17/2015 16:17   Ct Biopsy  07/24/2015   CLINICAL DATA:  39 year old male with a new diagnosis of lymphoma.  EXAM: CT BIOPSY   Date: 07/24/2015  PROCEDURE: 1. CT guided bone marrow aspiration and core biopsy Interventional Radiologist:  Criselda Peaches, MD  ANESTHESIA/SEDATION: Moderate (conscious) sedation was used. 2.5 mg Versed, 100 mcg Fentanyl were administered intravenously. The patient's vital signs were monitored continuously by radiology nursing throughout the procedure.  Sedation Time: 9 minutes  FLUOROSCOPY TIME:  None  TECHNIQUE: Informed consent was obtained from the patient following explanation of the procedure, risks, benefits and alternatives. The patient understands, agrees and consents for the procedure. All questions were addressed. A time out was performed.  The patient was positioned prone and noncontrast localization CT was performed of the pelvis to demonstrate the iliac marrow spaces.  Maximal barrier sterile technique utilized including caps, mask, sterile gowns, sterile gloves, large sterile drape, hand hygiene, and betadine prep.  Under sterile conditions and local anesthesia, an 11 gauge coaxial bone biopsy needle was advanced into the right iliac marrow space. Needle position was confirmed with CT imaging. Initially, bone marrow aspiration was performed. Next, the 11 gauge outer cannula was utilized to obtain a right iliac bone marrow core biopsy. Needle was removed. Hemostasis was obtained with compression. The patient tolerated the procedure well. Samples were prepared with the cytotechnologist. No immediate complications.  IMPRESSION: CT guided right iliac bone marrow aspiration and core biopsy. The On-Control drill was used.  Signed,  Criselda Peaches, MD  [facility]  [facility]   Electronically Signed   By: Jacqulynn Cadet M.D.   On: 07/24/2015 12:32   Dg Chest Port 1 View  07/22/2015   CLINICAL DATA:  Pulmonary edema, hypoxia  EXAM: PORTABLE CHEST - 1 VIEW  COMPARISON:  07/21/2015  FINDINGS: Heart size is normal. Right-sided PICC line in place  with tip over  the cavoatrial junction. Hazy perihilar rate greater than left basilar airspace opacities are slightly decreased. Trace pleural effusions.  IMPRESSION: Interval slight decrease in hazy perihilar airspace opacities compatible with improving edema.   Electronically Signed   By: Conchita Paris M.D.   On: 07/22/2015 08:10   Dg Chest Port 1 View  07/21/2015   CLINICAL DATA:  Acute respiratory failure with hypoxemia. Nonsmoker.  EXAM: PORTABLE CHEST - 1 VIEW  COMPARISON:  07/20/2015.  FINDINGS: Cardiomediastinal silhouette appears within normal limits for size. Diffuse pulmonary opacities persist but are slightly improved consistent with resolving edema. Decreased BILATERAL pleural effusions. PICC line tip SVC. No pneumothorax.  IMPRESSION: Improving pulmonary edema.   Electronically Signed   By: Staci Righter M.D.   On: 07/21/2015 09:38   Dg Chest Port 1 View  07/20/2015   CLINICAL DATA:  Shortness of Breath  EXAM: PORTABLE CHEST - 1 VIEW  COMPARISON:  July 15, 2015  FINDINGS: There is alveolar edema throughout both lungs. There are small bilateral effusions. Heart is upper normal in size. There is mild pulmonary venous hypertension. No adenopathy.  IMPRESSION: Extensive edema. Question a degree of congestive heart failure versus noncardiogenic edema. Note that cardiac silhouette is unchanged compared to recent prior study. No adenopathy apparent.   Electronically Signed   By: Lowella Grip III M.D.   On: 07/20/2015 15:36   Dg Fluoro Guide Lumbar Puncture  07/24/2015   CLINICAL DATA:  Lymphoma  EXAM: DIAGNOSTIC LUMBAR PUNCTURE UNDER FLUOROSCOPIC GUIDANCE  FLUOROSCOPY TIME:  Radiation Exposure Index (as provided by the fluoroscopic device):  If the device does not provide the exposure index:  Fluoroscopy Time (in minutes and seconds):  13 seconds  Number of Acquired Images:  0  PROCEDURE: Informed consent was obtained from the patient prior to the procedure, including potential complications  of headache, allergy, and pain. With the patient prone, the lower back was prepped with Betadine. 1% Lidocaine was used for local anesthesia. Lumbar puncture was performed at the L3-4 level using a 22 gauge needle with return of clear CSF with an opening pressure of 14 cm water. 12 ml of CSF were obtained for laboratory studies. The patient tolerated the procedure well and there were no apparent complications.  IMPRESSION: 1. Technically successful lumbar puncture. 12 cc of clear CSF were obtained for laboratory studies.   Electronically Signed   By: Kerby Moors M.D.   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 ? EBV related given h/o bothersome infection in 2013. PET/CT with significant intra-abdominal involvement, small intestinal involvement, some chest LNadenopathy as noted above. 2) Post-operative hypoxic respiratory failure with CXR showing b/l infiltrates ?Fluid overload - resolved with light diuresis. 2) Spontaneous TLS - uric acid normalized with allopurinol. Some bump in LFts now improved. Restarted on lower dose of Allopurinol. 3) AKI ?related to contrast/urate nephropathy less likely urinary obstruction. Creatinine has now improved from 1.54 to 1.1 to 0.9 4) Elevated transaminases - ? Related to allopurinol vs lymphomatous involvement. 5) Elevated free T4 - ? Related to his supplements vs ?thyroiditis vs paraneoplastic process. Plan -Bone Marrow biopsy and diagnostic LP for completion of staging done today -results pending. Minimal WBC's , nl TP and glucose - no overt CNS involvement but cytology results pending. -will f/u final LP cytology and bone marrow  biopsy results. -continue current dose of allopurinol -continue IVF with bicarb for TLS protection -prn lasix if signs of fluid overload. -G-6PD (normal), since we  might need to use rasburicase. -EPOCH-R starting 07/24/2014 with Rituxan day 5. -will need neulasta day 6 or 7 as outpatient. (on 07/31/2015) -cbc, phosphorus, magnesium daily, cmp and uric acid twice daily -Rasburicase 37m IVPB daily prn for uric acid >10 -IT Methotrexate Day 1 and Day 5 from cycle 2 or 3 (Started in cycle 3 in the NEJM study) -rpt PET/CT after 2 cycles. -patient has PICC line. Will need to remove prior to discharge [facility].  Will continue to follow daily. Appreciate cares by hospitalist team. Kindly call if questions.   GSullivan LoneMD MHillviewAAHIVMS SCentury City Endoscopy LLCCJackson Memorial HospitalHematology/Oncology Physician CSouthwest Healthcare Services (Office):       [phone](Work cell):  [phone](Fax):           [phone] 07/25/2015 11:27 PM

## 2015-07-25 NOTE — Progress Notes (Signed)
Pt  Stated he feels like fluids is building up in lungs again and wanted fluids cut down. On call Oncologist was notified order to reduce fluid to 50 and if pt unable to tolerate reduce to 20.  Pt wanted it reduced to 20 . Will continue to monitor pt closely.[name]

## 2015-07-26 ENCOUNTER — Telehealth: Payer: Self-pay | Admitting: Hematology

## 2015-07-26 DIAGNOSIS — E883 Tumor lysis syndrome: Secondary | ICD-10-CM

## 2015-07-26 DIAGNOSIS — C8378 Burkitt lymphoma, lymph nodes of multiple sites: Secondary | ICD-10-CM

## 2015-07-26 DIAGNOSIS — J9691 Respiratory failure, unspecified with hypoxia: Secondary | ICD-10-CM

## 2015-07-26 LAB — URINALYSIS, ROUTINE W REFLEX MICROSCOPIC
Bilirubin Urine: NEGATIVE
GLUCOSE, UA: NEGATIVE mg/dL
Hgb urine dipstick: NEGATIVE
KETONES UR: NEGATIVE mg/dL
LEUKOCYTES UA: NEGATIVE
NITRITE: NEGATIVE
PROTEIN: NEGATIVE mg/dL
Specific Gravity, Urine: 1.01 (ref 1.005–1.030)
Urobilinogen, UA: 0.2 mg/dL (ref 0.0–1.0)
pH: 6.5 (ref 5.0–8.0)

## 2015-07-26 LAB — COMPREHENSIVE METABOLIC PANEL
ALK PHOS: 43 U/L (ref 38–126)
ALT: 99 U/L — ABNORMAL HIGH (ref 17–63)
ALT: 97 U/L — AB (ref 17–63)
ANION GAP: 9 (ref 5–15)
AST: 56 U/L — ABNORMAL HIGH (ref 15–41)
AST: 51 U/L — AB (ref 15–41)
Albumin: 2.7 g/dL — ABNORMAL LOW (ref 3.5–5.0)
Albumin: 3 g/dL — ABNORMAL LOW (ref 3.5–5.0)
Alkaline Phosphatase: 49 U/L (ref 38–126)
Anion gap: 7 (ref 5–15)
BILIRUBIN TOTAL: 0.5 mg/dL (ref 0.3–1.2)
BUN: 26 mg/dL — ABNORMAL HIGH (ref 6–20)
BUN: 26 mg/dL — AB (ref 6–20)
CALCIUM: 8 mg/dL — AB (ref 8.9–10.3)
CHLORIDE: 105 mmol/L (ref 101–111)
CO2: 25 mmol/L (ref 22–32)
CO2: 25 mmol/L (ref 22–32)
CREATININE: 0.95 mg/dL (ref 0.61–1.24)
Calcium: 8 mg/dL — ABNORMAL LOW (ref 8.9–10.3)
Chloride: 104 mmol/L (ref 101–111)
Creatinine, Ser: 0.84 mg/dL (ref 0.61–1.24)
GFR calc Af Amer: >60 mL/min (ref >60)
GFR calc non Af Amer: >60 mL/min (ref >60)
Glucose, Bld: 135 mg/dL — ABNORMAL HIGH (ref 65–99)
Glucose, Bld: 157 mg/dL — ABNORMAL HIGH (ref 65–99)
POTASSIUM: 4.2 mmol/L (ref 3.5–5.1)
POTASSIUM: 4.2 mmol/L (ref 3.5–5.1)
SODIUM: 137 mmol/L (ref 135–145)
Sodium: 138 mmol/L (ref 135–145)
TOTAL PROTEIN: 5.4 g/dL — AB (ref 6.5–8.1)
Total Bilirubin: 0.4 mg/dL (ref 0.3–1.2)
Total Protein: 5.7 g/dL — ABNORMAL LOW (ref 6.5–8.1)

## 2015-07-26 LAB — CBC WITH DIFFERENTIAL/PLATELET
BASOS ABS: 0 10*3/uL (ref 0.0–0.1)
Basophils Relative: 0 %
EOS ABS: 0 10*3/uL (ref 0.0–0.7)
EOS PCT: 0 %
HCT: 37.3 % — ABNORMAL LOW (ref 39.0–52.0)
Hemoglobin: 12.9 g/dL — ABNORMAL LOW (ref 13.0–17.0)
LYMPHS ABS: 1.4 10*3/uL (ref 0.7–4.0)
LYMPHS PCT: 11 %
MCH: 29.2 pg (ref 26.0–34.0)
MCHC: 34.6 g/dL (ref 30.0–36.0)
MCV: 84.4 fL (ref 78.0–100.0)
MONO ABS: 0.9 10*3/uL (ref 0.1–1.0)
Monocytes Relative: 7 %
Neutro Abs: 11.1 10*3/uL — ABNORMAL HIGH (ref 1.7–7.7)
Neutrophils Relative %: 82 %
PLATELETS: 252 10*3/uL (ref 150–400)
RBC: 4.42 MIL/uL (ref 4.22–5.81)
RDW: 12.6 % (ref 11.5–15.5)
WBC: 13.4 10*3/uL — AB (ref 4.0–10.5)

## 2015-07-26 LAB — MAGNESIUM: MAGNESIUM: 2.3 mg/dL (ref 1.7–2.4)

## 2015-07-26 LAB — URIC ACID
URIC ACID, SERUM: 5.5 mg/dL (ref 4.4–7.6)
URIC ACID, SERUM: 6 mg/dL (ref 4.4–7.6)

## 2015-07-26 LAB — PHOSPHORUS: Phosphorus: 6.8 mg/dL — ABNORMAL HIGH (ref 2.5–4.6)

## 2015-07-26 MED ORDER — SODIUM CHLORIDE 0.9 % IV SOLN
Freq: Once | INTRAVENOUS | Status: AC
  Administered 2015-07-26: 18 mg via INTRAVENOUS
  Filled 2015-07-26: qty 4

## 2015-07-26 MED ORDER — POLYETHYLENE GLYCOL 3350 17 G PO PACK
17.0000 g | PACK | Freq: Every day | ORAL | Status: DC
  Administered 2015-07-26 – 2015-07-27 (×2): 17 g via ORAL
  Filled 2015-07-26 (×2): qty 1

## 2015-07-26 MED ORDER — VINCRISTINE SULFATE CHEMO INJECTION 1 MG/ML
Freq: Once | INTRAVENOUS | Status: AC
  Administered 2015-07-26: 11:00:00 via INTRAVENOUS
  Filled 2015-07-26: qty 11

## 2015-07-26 NOTE — Progress Notes (Signed)
R PICC leaking at insertion site. Good blood return noted, however biopatch was saturated and visible leaking with fluids running. RN to notify MD, advised not to infuse chemo into the PICC, refer to IR if PICC needed tonight.

## 2015-07-26 NOTE — Telephone Encounter (Signed)
Appointments per 9/14 pof schedule by new patient scheduler - no other appointments needed. tx being done inpt.

## 2015-07-26 NOTE — Progress Notes (Signed)
Manual calculation of BSA and dosing for Doxorubicin, Etoposide, and Vincristine completed. Secondary verification by Laural Benes, RN

## 2015-07-26 NOTE — Progress Notes (Signed)
RN called to room stating his IV dressing felt wet.  Site assessed and biopatch noted to be saturated. Blood return noted from both lumens. IV team notified.

## 2015-07-26 NOTE — Progress Notes (Signed)
[facility] PROGRESS NOTE  Donald [name] GEX:[id number] DOB: October 23, 1976 DOA: 07/14/2015 PCP: Drema Pry, DO  brief narrative 39 year old male with no medical history presented with 2 weeks of back pain while riding his bike. He had a chest x-ray done as outpatient which suggested a possible infection and was given antibiotic. However patient returned to the ED as he had right chest pain and was short of breath. In the ED was found to have a large right-sided pleural effusion and was admitted for further workup. Patient transferred to [facility] long for excisional core biopsy.  Assessment/Plan: Burkitt B cell Lymphoma:  Pleural effusion, right/abdominal mass suggestive of B cell lymphoma: -CT scan of the chest with contrast: -  significant effusion with compression of the right lung. -CT scan of the abdomen :- peritoneal carcinomatosis with no lymphadenopathy -Right  Thoracocentesis suggests exudate. -oncology following. Started on decadron on 9/10 after laparoscopic excisional biopsy on 9/9. PET scan  9/12. -PICC line placed on 9/9 -[place] core biopsy of omental mass and diagnostic/therapeutic paracentesis,. tissue flow cytometry suggests proliferative B cell.  -plan to start inpatient treatment. -SP LP and BM biopsy 9-13. No malignant cell CSF/  Started on chemo. Monitor for adverse reaction.  Phosphorus elevated. On IV bicarb gtt.   Postop Acute Hypoxemic Respiratory Failure on 9/9 Patient extubated and placed on nonrebreather. Given 10 mg IV Lasix. Lactic acid mildly elevated. Chest x-ray shows extensive pulmonary edema. Discontinued IV fluids. Received 2 doses of IV lasix ( total 40 mg on 9/10). Currently maintaining sats on 2.5 L via nasal cannula.  Continue incentive spirometry. Repeat chest x-ray showed improving pulmonary edema. Received one time dose of lasix 9-11--9-12.  Weight at 90 Kg. Hold on lasix doses. Follow clinically.  Monitor on chemo. If respiratory distress might need  lasix, chest x ray.   Acute kidney injury -possibly due to tumor lysis syndrome and IV contrast -Renal function back to normal.  Tumor lysis syndrome: -Resumed allopurinol since LFTs improved. -Renal function improving and uric acid normal. Stopped  IV fluids due to fluid overload.  Hypokalemia Resolved.   GERD Added PPI twice a day and Maalox. Symptoms improved.  Elevated TSH and free T4 No clear etiology.? Thyroiditis versus secondary hyper thyroidism. Follow thyroglobulin antibody and stimulating immunoglobulin.  Screening process: -non reactive HIV -CMV IGM and antibody neg -EBV results (DNA by PCR and VCA antibody) negative.  -ESR 6 -CRP 4.3 -hepatitis panel neg -AFP 4.5 -HCG tumor marker < 1     DVT prophylaxis: sq heparin  Diet: Regular   Consultants:  IR Oncology ( Dr Irene Limbo) [facility]  Procedures:  Thoracocentesis  Paracentesis  laproscopy abdominal biopsy  Antibiotics:  None  CODE STATUS: Full code Family communication: None at bedside today.  Disposition: start chemotherapy  HPI/Subjective: Doing well , no dyspnea. No BM last 2 days.   Objective: Filed Vitals:   07/26/15 1301  BP: 105/56  Pulse: 57  Temp: 97.9 F (36.6 C)  Resp: 18    Intake/Output Summary (Last 24 hours) at 07/26/15 1515 Last data filed at 07/26/15 1441  Gross per 24 hour  Intake    720 ml  Output   1600 ml  Net   -880 ml   Filed Weights   07/23/15 1635 07/24/15 0500 07/26/15 0700  Weight: 90.719 kg (200 lb) 90.719 kg (200 lb) 88.769 kg (195 lb 11.2 oz)    Exam:   General:  Middle aged male in no acute distress  Chest: crackles base,  more pronounced on the right.   CVS: Normal S1 and S2, no murmurs  GI: Soft, abdominal distention, nontender, bowel sounds present  Musculoskeletal: Warm, no edema  CNS: Alert and oriented  Data Reviewed: Basic Metabolic Panel:  Recent Labs Lab 07/20/15 0407  07/20/15 1815  07/21/15 1800   07/23/15 0455 07/24/15 0500 07/25/15 0550 07/25/15 0800 07/25/15 1700 07/26/15 0500  NA 138  < >  --   < > 138  < > 138 138  --  137 134* 138  K 4.2  < >  --   < > 3.7  < > 4.0 4.1  --  3.8 4.5 4.2  CL 102  < >  --   < > 98*  < > 101 103  --  103 102 104  CO2 21*  < >  --   < > 31  < > 29 27  --  28 25 25   GLUCOSE 76  < >  --   < > 146*  < > 109* 96  --  88 174* 135*  BUN 22*  < >  --   < > 20  < > 20 21*  --  17 20 26*  CREATININE 1.54*  < >  --   < > 0.97  < > 0.92 0.77  --  0.83 0.89 0.84  CALCIUM 7.5*  < >  --   < > 7.9*  < > 7.8* 8.1*  --  7.7* 7.8* 8.0*  MG 1.8  --   --   --   --   --   --   --  1.8  --   --  2.3  PHOS 5.8*  --  6.0*  --  3.7  --   --  4.7* 3.6  --   --  6.8*  < > = values in this interval not displayed. Liver Function Tests:  Recent Labs Lab 07/21/15 1800 07/24/15 0500 07/25/15 0800 07/25/15 1700 07/26/15 0500  AST 76* 88* 95* 90* 56*  ALT 61 97* 111* 123* 99*  ALKPHOS 40 45 44 52 43  BILITOT 0.6 0.7 0.4 0.4 0.5  PROT 5.5* 5.5* 4.9* 5.5* 5.4*  ALBUMIN 2.9* 2.9* 2.7* 3.0* 2.7*   No results for input(s): LIPASE, AMYLASE in the last 168 hours. No results for input(s): AMMONIA in the last 168 hours. CBC:  Recent Labs Lab 07/20/15 0407 07/20/15 1646 07/24/15 0500 07/25/15 0550 07/26/15 0500  WBC 9.4 9.8 8.3 9.3 13.4*  NEUTROABS 5.8 8.6* 5.5 5.7 11.1*  HGB 14.7 14.1 12.8* 12.9* 12.9*  HCT 43.1 41.9 38.1* 38.6* 37.3*  MCV 86.4 86.9 87.0 86.5 84.4  PLT 229 231 216 235 252   Cardiac Enzymes:  Recent Labs Lab 07/21/15 0538  TROPONINI <0.03   BNP (last 3 results)  Recent Labs  07/20/15 1646  BNP 13.9    ProBNP (last 3 results) No results for input(s): PROBNP in the last 8760 hours.  CBG:  Recent Labs Lab 07/23/15 0908  GLUCAP 88    Recent Results (from the past 240 hour(s))  Surgical pcr screen     Status: None   Collection Time: 07/19/15 10:12 PM  Result Value Ref Range Status   MRSA, PCR NEGATIVE NEGATIVE Final    Staphylococcus aureus NEGATIVE NEGATIVE Final    Comment:        The Xpert SA Assay (FDA approved for NASAL specimens in patients over 44 years of age), is one component of a  comprehensive surveillance program.  Test performance has been validated by [facility] for patients greater than or equal to 33 year old. It is not intended to diagnose infection nor to guide or monitor treatment.   CSF culture     Status: None (Preliminary result)   Collection Time: 07/24/15 11:46 AM  Result Value Ref Range Status   Specimen Description CSF  Final   Special Requests NONE  Final   Gram Stain   Final    WBC PRESENT, PREDOMINANTLY MONONUCLEAR NO ORGANISMS SEEN CYTOSPIN Gram Stain Report Called to,Read Back By and Verified With: BERGER,J. RN @1449  ON 9.13.16 BY MCCOY,N.    Culture   Final    NO GROWTH 2 DAYS Performed at [facility]    Report Status PENDING  Incomplete     Studies: No results found.  Scheduled Meds: . allopurinol  150 mg Oral BID  . DOXOrubicin/vinCRIStine/etoposide CHEMO IV infusion for Inpatient CI   Intravenous Once  . enoxaparin (LOVENOX) injection  40 mg Subcutaneous Q24H  . pantoprazole  40 mg Oral BID AC  . polyethylene glycol  17 g Oral Daily  . predniSONE  120 mg Oral QAC breakfast  . senna-docusate  2 tablet Oral QHS  . sodium chloride  10-40 mL Intracatheter Q12H   Continuous Infusions: . sodium chloride 10 mL/hr at 07/15/15 1023  . sodium chloride 10 mL/hr at 07/25/15 1056  .  sodium bicarbonate  infusion 1000 mL 75 mL/hr at 07/26/15 1346     Time spent: 25 minutes    Zyrion Coey, [facility] Pager [phone] If 7PM-7AM, please contact night-coverage at [email or web address], password [facility] 07/26/2015, 3:15 PM  LOS: 12 days

## 2015-07-26 NOTE — Progress Notes (Signed)
[facility] [name] [facility]   HEMATOLOGY/ONCOLOGY INPATIENT PROGRESS NOTE  Date of Service: 07/26/2015  Inpatient Attending: .Elmarie Shiley, MD   SUBJECTIVE  Patient seen this afternoon. He notes he is feeling well. No other acute new symptoms. No nausea/vomiting. No acute acute new concerns. Feels that his breathing is improved. Abdominal distension a little better.   OBJECTIVE:  PHYSICAL EXAMINATION: . Filed Vitals:   07/25/15 2100 07/26/15 0652 07/26/15 0700 07/26/15 1301  BP: 110/48 113/55  105/56  Pulse: 58 56  57  Temp: 98.1 F (36.7 C) 97.7 F (36.5 C)  97.9 F (36.6 C)  TempSrc: Oral Oral  Oral  Resp: 20 20  18   Height:      Weight:   195 lb 11.2 oz (88.769 kg)   SpO2: 95% 95%  95%   Filed Weights   07/23/15 1635 07/24/15 0500 07/26/15 0700  Weight: 200 lb (90.719 kg) 200 lb (90.719 kg) 195 lb 11.2 oz (88.769 kg)   .Body mass index is 27.31 kg/(m^2).  GENERAL: SKIN:no acute rashes. EYES: normal, conjunctiva are pink and non-injected, sclera clear OROPHARYNX:no exudate, no erythema and lips, buccal mucosa, and tongue normal  NECK: supple, no JVD, thyroid normal size, non-tender, without nodularity LYMPH:  no palpable lymphadenopathy in the cervical, axillary or inguinal LUNGS: CTA bilaterally HEART: regular rate & rhythm,  no murmurs and trace lower extremity edema ABDOMEN: abdomen mildly distended (improved), BS normoactive, laparoscopic incisions clean and healing well. PSYCH: alert & oriented x 3 with fluent speech NEURO: no focal motor/sensory deficits  MEDICAL HISTORY:  Past Medical History  Diagnosis Date  . EBV infection 2013    Patient notes that he had an EBV infection in 2013 that left him with chronic fatigue. She also notes that he had significant lymphadenopathy and thyroiditis at the time. He has been managing his symptoms with an alternative medicine practitioner in [place] and with other alternative medicines.  . Marijuana use, episodic     SURGICAL  HISTORY: Past Surgical History  Procedure Laterality Date  . Tonsillectomy    . Appendectomy    . Laparoscopy N/A 07/20/2015    Procedure: LAPAROSCOPY DIAGNOSTIC, MULTIPLE BIOPSIES, DRAINAGE OF ASCITES;  Surgeon: Fanny Skates, MD;  Location: [facility];  Service: General;  Laterality: N/A;    SOCIAL HISTORY: Social History   Social History  . Marital Status: Married    Spouse Name: N/A  . Number of Children: N/A  . Years of Education: N/A   Occupational History  . Not on file.   Social History Main Topics  . Smoking status: Never Smoker   . Smokeless tobacco: Not on file  . Alcohol Use: No  . Drug Use: 1.00 per week    Special: Marijuana  . Sexual Activity: Not on file   Other Topics Concern  . 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   He has a fiance and has been in a steady relationship.      He has tried to maintain a very healthy lifestyle and cycles about 100 miles a week. He also uses a fair number of over-the-counter alternative medicines to stay healthy.    FAMILY HISTORY: Family History  Problem Relation Age of Onset  . Heart failure Father     ALLERGIES:  is allergic to fluoride preparations and sulfa antibiotics.  MEDICATIONS:  Scheduled Meds: . allopurinol  150 mg Oral BID  .  DOXOrubicin/vinCRIStine/etoposide CHEMO IV infusion for Inpatient CI   Intravenous Once  . enoxaparin (LOVENOX) injection  40 mg Subcutaneous Q24H  . pantoprazole  40 mg Oral BID AC  . polyethylene glycol  17 g Oral Daily  . predniSONE  120 mg Oral QAC breakfast  . senna-docusate  2 tablet Oral QHS  . sodium chloride  10-40 mL Intracatheter Q12H   Continuous Infusions: . sodium chloride 10 mL/hr at 07/15/15 1023  . sodium chloride 10 mL/hr at 07/25/15 1056  .  sodium bicarbonate  infusion 1000 mL 75 mL/hr at 07/26/15 1346   PRN Meds:.alteplase, Cold Pack, fludeoxyglucose F - 18,  gi cocktail, heparin lock flush, heparin lock flush, Hot Pack, HYDROcodone-acetaminophen, LORazepam, morphine injection, ondansetron (ZOFRAN) IV, rasburicase (ELITEK) IV infusion, sodium chloride, sodium chloride, sodium chloride  REVIEW OF SYSTEMS:    10 Point review of Systems was done is negative except as noted above.   LABORATORY DATA:  I have reviewed the data as listed  . CBC Latest Ref Rng 07/26/2015 07/25/2015 07/24/2015  WBC 4.0 - 10.5 K/uL 13.4(H) 9.3 8.3  Hemoglobin 13.0 - 17.0 g/dL 12.9(L) 12.9(L) 12.8(L)  Hematocrit 39.0 - 52.0 % 37.3(L) 38.6(L) 38.1(L)  Platelets 150 - 400 K/uL 252 235 216   . CBC    Component Value Date/Time   WBC 13.4* 07/26/2015 0500   RBC 4.42 07/26/2015 0500   RBC 5.39 07/18/2015 0920   HGB 12.9* 07/26/2015 0500   HCT 37.3* 07/26/2015 0500   PLT 252 07/26/2015 0500   MCV 84.4 07/26/2015 0500   MCH 29.2 07/26/2015 0500   MCHC 34.6 07/26/2015 0500   RDW 12.6 07/26/2015 0500   LYMPHSABS 1.4 07/26/2015 0500   MONOABS 0.9 07/26/2015 0500   EOSABS 0.0 07/26/2015 0500   BASOSABS 0.0 07/26/2015 0500      . CMP Latest Ref Rng 07/26/2015 07/25/2015 07/25/2015  Glucose 65 - 99 mg/dL 135(H) 174(H) 88  BUN 6 - 20 mg/dL 26(H) 20 17  Creatinine 0.61 - 1.24 mg/dL 0.84 0.89 0.83  Sodium 135 - 145 mmol/L 138 134(L) 137  Potassium 3.5 - 5.1 mmol/L 4.2 4.5 3.8  Chloride 101 - 111 mmol/L 104 102 103  CO2 22 - 32 mmol/L 25 25 28   Calcium 8.9 - 10.3 mg/dL 8.0(L) 7.8(L) 7.7(L)  Total Protein 6.5 - 8.1 g/dL 5.4(L) 5.5(L) 4.9(L)  Total Bilirubin 0.3 - 1.2 mg/dL 0.5 0.4 0.4  Alkaline Phos 38 - 126 U/L 43 52 44  AST 15 - 41 U/L 56(H) 90(H) 95(H)  ALT 17 - 63 U/L 99(H) 123(H) 111(H)   Component     Latest Ref Rng 07/24/2015  Tube #      1  Color, CSF     COLORLESS COLORLESS  Appearance, CSF     CLEAR CLEAR (A)  Supernatant      NOT INDICATED  RBC Count, CSF     0 /cu mm 2 (H)  WBC, CSF     0 - 5 /cu mm 1  Other Cells, CSF      TOO FEW TO COUNT,  SMEAR AVAILABLE FOR REVIEW  Specimen Description      CSF  Special Requests      NONE  Gram Stain      WBC PRESENT, PREDOMINANTLY MONONUCLEAR . . .  Culture      PENDING  Report Status      PENDING  Glucose, CSF     40 - 70 mg/dL 59  Total  Protein,  CSF     15 - 45 mg/dL 15     RADIOGRAPHIC STUDIES: I have personally reviewed the radiological images as listed and agreed with the findings in the report. Dg Chest 2 View  07/15/2015   CLINICAL DATA:  Followup right pleural effusion, status post right thoracentesis.  EXAM: CHEST  2 VIEW  COMPARISON:  Yesterday.  FINDINGS: Stable right upper companion rib shadows. No pneumothorax seen. Significantly decreased right pleural fluid. The interstitial markings remain mildly prominent. Normal sized heart. Interval mild left lower lobe linear density. Unremarkable bones.  IMPRESSION: 1. No pneumothorax following right thoracentesis. 2. Significantly less right pleural fluid. 3. Interval mild left lower lobe atelectasis. 4. Stable mild chronic interstitial lung disease.   Electronically Signed   By: Claudie Revering M.D.   On: 07/15/2015 17:21   Dg Chest 2 View  07/14/2015   CLINICAL DATA:  Right-sided chest pain for 2-3 days. Shortness of breath.  EXAM: CHEST  2 VIEW  COMPARISON:  07/12/2015  FINDINGS: Heart size is probably normal. Suspect lung opacity at the right base in addition to the elevated hemidiaphragm. Opacity at the right lung base may be related to subpulmonic effusion. Overall the appearance is stable since previous exam. There is a trace left pleural effusion. No pulmonary edema.  IMPRESSION: Persistent significant opacity at the right lung base. Consider right lateral decubitus view to determine component of layering pleural effusion.   Electronically Signed   By: Nolon Nations M.D.   On: 07/14/2015 16:56   Dg Chest 2 View  07/12/2015   CLINICAL DATA:  Anterior chest pain for 1 week with cough  EXAM: CHEST  2 VIEW  COMPARISON:  None.   FINDINGS: There is mild elevation the right hemidiaphragm. There is evidence suggesting consolidation in the medial right base. Lungs elsewhere clear. Heart size and pulmonary vascularity are normal. No adenopathy. No bone lesions.  IMPRESSION: Evidence of a degree of consolidation in the medial right base. There is elevation of the right hemidiaphragm. Lungs elsewhere clear. Cardiac silhouette within normal limits.  Followup PA and lateral chest radiographs recommended in 3-4 weeks following trial of antibiotic therapy to ensure resolution and exclude underlying malignancy.   Electronically Signed   By: Lowella Grip III M.D.   On: 07/12/2015 15:52   Ct Angio Chest Pe W/cm &/or Wo Cm  07/14/2015   CLINICAL DATA:  Right chest pain  EXAM: CT ANGIOGRAPHY CHEST WITH CONTRAST  TECHNIQUE: Multidetector CT imaging of the chest was performed using the standard protocol during bolus administration of intravenous contrast. Multiplanar CT image reconstructions and MIPs were obtained to evaluate the vascular anatomy.  CONTRAST:  171m OMNIPAQUE IOHEXOL 350 MG/ML SOLN  COMPARISON:  Chest x-ray 07/14/2015  FINDINGS: Negative for pulmonary embolism. Negative for aortic dissection or aneurysm  Moderately large right pleural effusion with compressive atelectasis of the right lower lobe. Atelectasis also present in the right middle lobe. Partial collapse of the right upper lobe medially adjacent to the mediastinum. This extends into the right upper lobe laterally. Small left pleural effusion. No significant infiltrate on the left.  No adenopathy is present in the mediastinum.  No acute bony abnormality  In the upper abdomen, there is mild to moderate ascites. This could be due to liver disease. There is fluid around the liver and spleen. The spleen is not enlarged. Limited imaging of the pancreas and kidneys are grossly normal. There is soft tissue infiltration in the omentum. Carcinoma not excluded.  Review  of the MIP images  confirms the above findings.  IMPRESSION: Negative for pulmonary embolism  Moderate right pleural effusion with compressive atelectasis in the right lower lobe. Additional atelectasis in the right middle lobe and right upper lobe. No definite mass or pneumonia however infection and tumor are in the differential. There is a small left effusion  Concerning findings in the abdomen. There is ascites around the liver and spleen. There is soft tissue density involving the omentum which can be seen with carcinoma. Further imaging of the abdomen with CT abdomen pelvis with contrast is suggested.   Electronically Signed   By: Franchot Gallo M.D.   On: 07/14/2015 18:28   Ct Abdomen Pelvis W Contrast  07/15/2015   CLINICAL DATA:  39 year old male with ascites and potential omental disease noted on prior chest CT. Follow-up abdominal CT scan.  EXAM: CT ABDOMEN AND PELVIS WITH CONTRAST  TECHNIQUE: Multidetector CT imaging of the abdomen and pelvis was performed using the standard protocol following bolus administration of intravenous contrast.  CONTRAST:  112m OMNIPAQUE IOHEXOL 300 MG/ML  SOLN  COMPARISON:  CT of the chest 07/14/2015. CT of the pelvis 03/03/2008.  FINDINGS: Lower chest: Small bilateral pleural effusions (left greater than right). Subsegmental atelectasis in the posterior aspect of the right lower lobe.  Hepatobiliary: No cystic or solid hepatic lesion identified. No intra or extrahepatic biliary ductal dilatation. High attenuation material filling the gallbladder, presumably vicarious excretion of contrast material from the recent chest CT.  Pancreas: No pancreatic mass. No pancreatic ductal dilatation. No pancreatic or peripancreatic fluid or inflammatory changes.  Spleen: Unremarkable.  Adrenals/Urinary Tract: Bilateral adrenal glands and bilateral kidneys are unremarkable in appearance. No hydroureteronephrosis. Urinary bladder is unremarkable in appearance.  Stomach/Bowel: In the mid small bowel (like mid  to distal jejunum) there is a profoundly asymmetrically thickened loop of bowel best demonstrated on image 56 of series 2, where the wall measures up to 22 mm in thickness medially. Adjacent to this there are soft tissue masses extending into the root of the small bowel mesentery, largest of which measures 2.7 x 3.3 cm (image 47 of series 2), likely malignant lymph nodes. The appendix is not confidently identified. No pathologic dilatation of small bowel or colon. Stomach is grossly normal in appearance.  Vascular/Lymphatic: No significant atherosclerotic disease, aneurysm or dissection identified in the abdominal or pelvic vasculature. Circumaortic left renal vein (normal anatomical variant) incidentally noted. Probable mesenteric lymphadenopathy, as discussed above.  Reproductive: Prostate gland and seminal vesicles are unremarkable in appearance. Inguinal canals and visualized portions of the scrotum were grossly unremarkable in appearance. No lymphadenopathy noted along the gonadal drainage system.  Other: Large amount of abnormal enhancing soft tissue throughout the peritoneal cavity, most notable in the omentum, particularly in the right upper quadrant where there is bulky omental caking. Small volume of presumably malignant ascites. No pneumoperitoneum. Some abnormal soft tissue is extending through the anterior abdominal wall into the periumbilical region (image 53 of series 2).  Musculoskeletal: There are no aggressive appearing lytic or blastic lesions noted in the visualized portions of the skeleton.  IMPRESSION: 1. Widespread intraperitoneal malignancy, as demonstrated by extensive peritoneal nodularity and enhancement, widespread omental caking, likely malignant ascites and extensive mesenteric lymphadenopathy. The exact source of malignancy is uncertain, however, given the focally thickened loop of small bowel (likely jejunal) in the left side of the central abdomen (image 56 of series 2), a primary  small bowel malignancy is suspected. 2. Small bilateral pleural effusions (left  greater than right). Right-sided pleural effusion significantly decreased following thoracentesis. Resolving subsegmental atelectasis in the right lower lobe. 3. Additional incidental findings, as above. These results were called by telephone at the time of interpretation on 07/15/2015 at 5:33 pm to Dr. Olevia Bowens, who verbally acknowledged these results.   Electronically Signed   By: Vinnie Langton M.D.   On: 07/15/2015 17:34   Nm Pet Image Initial (pi) Skull Base To Thigh  07/23/2015   CLINICAL DATA:  Initial treatment strategy for high-grade non-Hodgkin's lymphoma. B-cell lymphoma.  EXAM: NUCLEAR MEDICINE PET SKULL BASE TO THIGH  TECHNIQUE: 10.0 mCi F-18 FDG was injected intravenously. Full-ring PET imaging was performed from the skull base to thigh after the radiotracer. CT data was obtained and used for attenuation correction and anatomic localization.  FASTING BLOOD GLUCOSE:  Value: 88 mg/dl  COMPARISON:  CT abdomen 07/15/2015, CT thorax 07/14/2015  FINDINGS: NECK  Small hypermetabolic lymph nodes the just deep to the RIGHT clavicle measure 9 mm short axis on image 54, series 4 with intense metabolic activity (SUV max 6.2).  CHEST  Small hypermetabolic internal mammary lymph nodes on the RIGHT (image 66 with SUV max equal 5.). Small hypermetabolic nodule posterior to the sternum in the anterior mediastinum on image 69 of fused data set.  Within the precordial space anterior to the liver there is a 4.5 cm hypermetabolic mass. Smaller hypermetabolic nodule anterior to the RIGHT ventricle.  ABDOMEN/PELVIS  There is a thin rim of intense metabolic activity associated entirety of the peritoneal surface consists with peritoneal metastasis / carcinomatosis. There is more focal activity within the LEFT abdomen associated with the small bowel wall thickening. This segmental thickening measures 17 mm in single wall thickness with intense  metabolic activity (SUV max 14.6). Central mesenteric mass measures 3 cm with SUV max 13.2.  There are no hypermetabolic inguinal or iliac lymph nodes.  SKELETON  No focal hypermetabolic activity to suggest skeletal metastasis.  IMPRESSION: 1. Thin rim of hypermetabolic activity throughout the peritoneal space consistent with peritoneal carcinomatosis. 2. Focal intense metabolic activity associated with the proximal small bowel consistent with lymphoma involvement of bowel. 3. Hypermetabolic mesenteric nodule  /node consistent with lymphoma. 4. Several small hypermetabolic lymph nodes in the RIGHT supraclavicular and internal mammary nodal stations. 5. Nodal metastasis anterior to the liver in the precordial space. This may be the most assessable for biopsy. 6. Normal spleen and bone marrow.   Electronically Signed   By: Suzy Bouchard M.D.   On: 07/23/2015 11:35   US Biopsy  07/17/2015   CLINICAL DATA:  Extensive peritoneal nodularity and omental caking of uncertain etiology. Periumbilical masses.  EXAM: ULTRASOUND-GUIDED CORE PERIUMBILICAL BIOPSY  TECHNIQUE: An ultrasound guided biopsy was thoroughly discussed with the patient and questions were answered. The benefits, risks, alternatives, and complications were also discussed. The patient understands and wishes to proceed with the procedure. A verbal as well as written consent was obtained.  Survey ultrasound of the periumbilical region was performed and an appropriate skin entry site was determined. Skin site was marked, prepped with chlorhexidine, and draped in usual sterile fashion, and infiltrated locally with 1% lidocaine.  Intravenous Fentanyl and Versed were administered as conscious sedation during continuous cardiorespiratory monitoring by the radiology RN, with a total moderate sedation time of less than 30 minutes.  Under real-time ultrasound guidance, a 17 gauge trocar needle was advanced to the margin of the lesion. Once needle tip position was  confirmed, coaxial 18-gauge core biopsy samples were  obtained, submitted in saline to surgical pathology. The guide needle was removed. Postprocedure scans show no hemorrhage or other apparent complication.  COMPLICATIONS: COMPLICATIONS None immediate  FINDINGS: Nodular periumbilical subcutaneous masses were localized. Core biopsy samples were obtained without complication.  IMPRESSION: 1. Technically successful ultrasound guided core periumbilical nodule biopsy.   Electronically Signed   By: Lucrezia Europe M.D.   On: 07/17/2015 16:15   US Paracentesis  07/17/2015   CLINICAL DATA:  Omental disease and peritoneal nodules.  EXAM: ULTRASOUND GUIDED PARACENTESIS  TECHNIQUE: The procedure, risks (including but not limited to bleeding, infection, organ damage ), benefits, and alternatives were explained to the patient. Questions regarding the procedure were encouraged and answered. The patient understands and consents to the procedure. Survey ultrasound of the abdomen was performed and an appropriate skin entry site in the right lower abdomen was selected. Skin site was marked, prepped with Betadine, and draped in usual sterile fashion, and infiltrated locally with 1% lidocaine. A 5 French multisidehole Yueh sheath needle was advanced into the peritoneal space until fluid could be aspirated. The sheath was advanced and the needle removed. 1.2 L of cloudy yellow ascites were aspirated. A sample sent to cytology for the requested studies.  COMPLICATIONS: COMPLICATIONS none  IMPRESSION: Technically successful ultrasound guided paracentesis, removing 1.2 L of ascites. Sample to cytology.   Electronically Signed   By: Lucrezia Europe M.D.   On: 07/17/2015 16:17   Ct Biopsy  07/24/2015   CLINICAL DATA:  39 year old male with a new diagnosis of lymphoma.  EXAM: CT BIOPSY  Date: 07/24/2015  PROCEDURE: 1. CT guided bone marrow aspiration and core biopsy Interventional Radiologist:  Criselda Peaches, MD  ANESTHESIA/SEDATION:  Moderate (conscious) sedation was used. 2.5 mg Versed, 100 mcg Fentanyl were administered intravenously. The patient's vital signs were monitored continuously by radiology nursing throughout the procedure.  Sedation Time: 9 minutes  FLUOROSCOPY TIME:  None  TECHNIQUE: Informed consent was obtained from the patient following explanation of the procedure, risks, benefits and alternatives. The patient understands, agrees and consents for the procedure. All questions were addressed. A time out was performed.  The patient was positioned prone and noncontrast localization CT was performed of the pelvis to demonstrate the iliac marrow spaces.  Maximal barrier sterile technique utilized including caps, mask, sterile gowns, sterile gloves, large sterile drape, hand hygiene, and betadine prep.  Under sterile conditions and local anesthesia, an 11 gauge coaxial bone biopsy needle was advanced into the right iliac marrow space. Needle position was confirmed with CT imaging. Initially, bone marrow aspiration was performed. Next, the 11 gauge outer cannula was utilized to obtain a right iliac bone marrow core biopsy. Needle was removed. Hemostasis was obtained with compression. The patient tolerated the procedure well. Samples were prepared with the cytotechnologist. No immediate complications.  IMPRESSION: CT guided right iliac bone marrow aspiration and core biopsy. The On-Control drill was used.  Signed,  Criselda Peaches, MD  [facility]  [facility]   Electronically Signed   By: Jacqulynn Cadet M.D.   On: 07/24/2015 12:32   Dg Chest Port 1 View  07/22/2015   CLINICAL DATA:  Pulmonary edema, hypoxia  EXAM: PORTABLE CHEST - 1 VIEW  COMPARISON:  07/21/2015  FINDINGS: Heart size is normal. Right-sided PICC line in place with tip over the cavoatrial junction. Hazy perihilar rate greater than left basilar airspace opacities are slightly decreased. Trace pleural effusions.   IMPRESSION: Interval slight decrease in hazy perihilar  airspace opacities compatible with improving edema.   Electronically Signed   By: Conchita Paris M.D.   On: 07/22/2015 08:10   Dg Chest Port 1 View  07/21/2015   CLINICAL DATA:  Acute respiratory failure with hypoxemia. Nonsmoker.  EXAM: PORTABLE CHEST - 1 VIEW  COMPARISON:  07/20/2015.  FINDINGS: Cardiomediastinal silhouette appears within normal limits for size. Diffuse pulmonary opacities persist but are slightly improved consistent with resolving edema. Decreased BILATERAL pleural effusions. PICC line tip SVC. No pneumothorax.  IMPRESSION: Improving pulmonary edema.   Electronically Signed   By: Staci Righter M.D.   On: 07/21/2015 09:38   Dg Chest Port 1 View  07/20/2015   CLINICAL DATA:  Shortness of Breath  EXAM: PORTABLE CHEST - 1 VIEW  COMPARISON:  July 15, 2015  FINDINGS: There is alveolar edema throughout both lungs. There are small bilateral effusions. Heart is upper normal in size. There is mild pulmonary venous hypertension. No adenopathy.  IMPRESSION: Extensive edema. Question a degree of congestive heart failure versus noncardiogenic edema. Note that cardiac silhouette is unchanged compared to recent prior study. No adenopathy apparent.   Electronically Signed   By: Lowella Grip III M.D.   On: 07/20/2015 15:36   Dg Fluoro Guide Lumbar Puncture  07/24/2015   CLINICAL DATA:  Lymphoma  EXAM: DIAGNOSTIC LUMBAR PUNCTURE UNDER FLUOROSCOPIC GUIDANCE  FLUOROSCOPY TIME:  Radiation Exposure Index (as provided by the fluoroscopic device):  If the device does not provide the exposure index:  Fluoroscopy Time (in minutes and seconds):  13 seconds  Number of Acquired Images:  0  PROCEDURE: Informed consent was obtained from the patient prior to the procedure, including potential complications of headache, allergy, and pain. With the patient prone, the lower back was prepped with Betadine. 1% Lidocaine was used for local anesthesia. Lumbar  puncture was performed at the L3-4 level using a 22 gauge needle with return of clear CSF with an opening pressure of 14 cm water. 12 ml of CSF were obtained for laboratory studies. The patient tolerated the procedure well and there were no apparent complications.  IMPRESSION: 1. Technically successful lumbar puncture. 12 cc of clear CSF were obtained for laboratory studies.   Electronically Signed   By: Kerby Moors M.D.   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 ? EBV related given h/o bothersome infection in 2013. PET/CT with significant intra-abdominal involvement, small intestinal involvement, some chest LNadenopathy as noted above. Bone Marrow Biopsy - no evidence of lymphoma CSF - no overt evidence of CNS involvement by lymphoma 2) Post-operative hypoxic respiratory failure with CXR showing b/l infiltrates ?Fluid overload - resolved with light diuresis. 2) Spontaneous TLS - uric acid normalized with allopurinol. Some bump in LFts now improved. Restarted on lower dose of Allopurinol. Some increase in phosphorus consistent with tumor lysis. 3) AKI ?related to contrast/urate nephropathy less likely urinary obstruction. Creatinine has now improved from 1.54 to 1.1 to 0.9 4) Elevated transaminases - ? Related to allopurinol vs lymphomatous involvement. 5) Elevated free T4 - ? Related to his supplements vs ?thyroiditis vs paraneoplastic process. Plan -continue current dose of allopurinol -continue IVF with bicarb for TLS protection -prn lasix if signs of fluid overload. -G-6PD (normal), since we might need to use rasburicase. -EPOCH-R starting 07/24/2014 with Rituxan day 5. -  will need neulasta day 6 or 7 as outpatient. (on 07/31/2015) -clinic followup on 08/03/2015 -cbc, phosphorus, magnesium daily, cmp and uric acid twice  daily -Rasburicase 55m IVPB daily prn for uric acid >10 -IT Methotrexate for CNS prophylaxis Day 1 and Day 5 from cycle 2 or 3 (Started in cycle 3 in the NEJM study) -rpt PET/CT after 2 cycles. -patient has PICC line. Will need to remove prior to discharge and [facility].  Will continue to follow daily. Appreciate cares by hospitalist team. Kindly call if questions.   GSullivan LoneMD MKo VayaAAHIVMS SLafayette Regional Rehabilitation HospitalCSt Mary Mercy HospitalHematology/Oncology Physician CPemisc[facility] (Office):       [phone](Work cell):  [phone](Fax):           [phone] 07/26/2015 3:26 PM

## 2015-07-27 ENCOUNTER — Inpatient Hospital Stay (HOSPITAL_COMMUNITY): Payer: BLUE CROSS/BLUE SHIELD

## 2015-07-27 DIAGNOSIS — R0600 Dyspnea, unspecified: Secondary | ICD-10-CM | POA: Insufficient documentation

## 2015-07-27 DIAGNOSIS — R001 Bradycardia, unspecified: Secondary | ICD-10-CM

## 2015-07-27 LAB — COMPREHENSIVE METABOLIC PANEL
ALBUMIN: 2.8 g/dL — AB (ref 3.5–5.0)
ALBUMIN: 2.8 g/dL — AB (ref 3.5–5.0)
ALK PHOS: 48 U/L (ref 38–126)
ALT: 82 U/L — ABNORMAL HIGH (ref 17–63)
ALT: 153 U/L — ABNORMAL HIGH (ref 17–63)
ANION GAP: 7 (ref 5–15)
ANION GAP: 7 (ref 5–15)
AST: 41 U/L (ref 15–41)
AST: 119 U/L — ABNORMAL HIGH (ref 15–41)
Alkaline Phosphatase: 52 U/L (ref 38–126)
BILIRUBIN TOTAL: 0.6 mg/dL (ref 0.3–1.2)
BILIRUBIN TOTAL: 0.5 mg/dL (ref 0.3–1.2)
BUN: 25 mg/dL — ABNORMAL HIGH (ref 6–20)
BUN: 25 mg/dL — ABNORMAL HIGH (ref 6–20)
CALCIUM: 8.3 mg/dL — AB (ref 8.9–10.3)
CO2: 24 mmol/L (ref 22–32)
CO2: 24 mmol/L (ref 22–32)
Calcium: 8.1 mg/dL — ABNORMAL LOW (ref 8.9–10.3)
Chloride: 107 mmol/L (ref 101–111)
Chloride: 106 mmol/L (ref 101–111)
Creatinine, Ser: 0.87 mg/dL (ref 0.61–1.24)
Creatinine, Ser: 0.92 mg/dL (ref 0.61–1.24)
GFR calc Af Amer: >60 mL/min (ref >60)
GFR calc Af Amer: >60 mL/min (ref >60)
GLUCOSE: 137 mg/dL — AB (ref 65–99)
GLUCOSE: 145 mg/dL — AB (ref 65–99)
POTASSIUM: 3.8 mmol/L (ref 3.5–5.1)
Potassium: 3.9 mmol/L (ref 3.5–5.1)
Sodium: 138 mmol/L (ref 135–145)
Sodium: 137 mmol/L (ref 135–145)
TOTAL PROTEIN: 5.1 g/dL — AB (ref 6.5–8.1)
TOTAL PROTEIN: 5.4 g/dL — AB (ref 6.5–8.1)

## 2015-07-27 LAB — CBC WITH DIFFERENTIAL/PLATELET
Basophils Absolute: 0 10*3/uL (ref 0.0–0.1)
Basophils Relative: 0 %
EOS ABS: 0 10*3/uL (ref 0.0–0.7)
EOS PCT: 0 %
HCT: 35.3 % — ABNORMAL LOW (ref 39.0–52.0)
Hemoglobin: 12.1 g/dL — ABNORMAL LOW (ref 13.0–17.0)
LYMPHS ABS: 1.3 10*3/uL (ref 0.7–4.0)
LYMPHS PCT: 9 %
MCH: 29 pg (ref 26.0–34.0)
MCHC: 34.3 g/dL (ref 30.0–36.0)
MCV: 84.7 fL (ref 78.0–100.0)
MONO ABS: 1.2 10*3/uL — AB (ref 0.1–1.0)
MONOS PCT: 8 %
Neutro Abs: 12.7 10*3/uL — ABNORMAL HIGH (ref 1.7–7.7)
Neutrophils Relative %: 83 %
PLATELETS: 248 10*3/uL (ref 150–400)
RBC: 4.17 MIL/uL — ABNORMAL LOW (ref 4.22–5.81)
RDW: 12.9 % (ref 11.5–15.5)
WBC: 15.1 10*3/uL — ABNORMAL HIGH (ref 4.0–10.5)

## 2015-07-27 LAB — APTT: aPTT: 24 seconds (ref 24–37)

## 2015-07-27 LAB — CSF CULTURE W GRAM STAIN: Culture: NO GROWTH

## 2015-07-27 LAB — URIC ACID
URIC ACID, SERUM: 5.5 mg/dL (ref 4.4–7.6)
URIC ACID, SERUM: 4.9 mg/dL (ref 4.4–7.6)

## 2015-07-27 LAB — PROTIME-INR
INR: 1.15 (ref 0.00–1.49)
PROTHROMBIN TIME: 14.9 s (ref 11.6–15.2)

## 2015-07-27 LAB — PHOSPHORUS: Phosphorus: 4.7 mg/dL — ABNORMAL HIGH (ref 2.5–4.6)

## 2015-07-27 LAB — MAGNESIUM: Magnesium: 2.2 mg/dL (ref 1.7–2.4)

## 2015-07-27 MED ORDER — VINCRISTINE SULFATE CHEMO INJECTION 1 MG/ML
Freq: Once | INTRAVENOUS | Status: AC
  Administered 2015-07-27: 16:00:00 via INTRAVENOUS
  Filled 2015-07-27: qty 14

## 2015-07-27 MED ORDER — LIDOCAINE HCL 1 % IJ SOLN
INTRAMUSCULAR | Status: AC
  Filled 2015-07-27: qty 20

## 2015-07-27 MED ORDER — CEFAZOLIN SODIUM-DEXTROSE 2-3 GM-% IV SOLR
INTRAVENOUS | Status: AC
  Filled 2015-07-27: qty 50

## 2015-07-27 MED ORDER — SODIUM CHLORIDE 0.9 % IV SOLN
8.0000 mg | Freq: Once | INTRAVENOUS | Status: AC
  Administered 2015-07-27: 8 mg via INTRAVENOUS
  Filled 2015-07-27: qty 4

## 2015-07-27 MED ORDER — CEFAZOLIN SODIUM-DEXTROSE 2-3 GM-% IV SOLR
2.0000 g | Freq: Once | INTRAVENOUS | Status: AC
  Administered 2015-07-27: 2 g via INTRAVENOUS

## 2015-07-27 MED ORDER — POLYETHYLENE GLYCOL 3350 17 G PO PACK
17.0000 g | PACK | Freq: Two times a day (BID) | ORAL | Status: DC
  Administered 2015-07-27 – 2015-07-28 (×2): 17 g via ORAL
  Filled 2015-07-27 (×6): qty 1

## 2015-07-27 MED ORDER — FENTANYL CITRATE (PF) 100 MCG/2ML IJ SOLN
INTRAMUSCULAR | Status: AC
  Filled 2015-07-27: qty 4

## 2015-07-27 MED ORDER — MIDAZOLAM HCL 2 MG/2ML IJ SOLN
INTRAMUSCULAR | Status: AC | PRN
  Administered 2015-07-27 (×6): 1 mg via INTRAVENOUS

## 2015-07-27 MED ORDER — MIDAZOLAM HCL 2 MG/2ML IJ SOLN
INTRAMUSCULAR | Status: AC
  Filled 2015-07-27: qty 6

## 2015-07-27 MED ORDER — FENTANYL CITRATE (PF) 100 MCG/2ML IJ SOLN
INTRAMUSCULAR | Status: AC | PRN
  Administered 2015-07-27 (×3): 25 ug via INTRAVENOUS
  Administered 2015-07-27: 50 ug via INTRAVENOUS
  Administered 2015-07-27: 25 ug via INTRAVENOUS

## 2015-07-27 NOTE — Progress Notes (Signed)
Donald [name] [facility]   HEMATOLOGY/ONCOLOGY INPATIENT PROGRESS NOTE  Date of Service: 07/27/2015  Inpatient Attending: .Elmarie Shiley, MD   SUBJECTIVE  Patient seen this afternoon. His PICC line was leaking and he had a port placed by IR this morning. Restarted on his chemotherapy. Noted to have some asymptomatic bradycardia after port placement - notes his baseline HR runs in the 50's and probably lower in bed and with sedation. Chronotropically competent with increase in HR with ambulation and movement. EKG with no heart blocks.  OBJECTIVE:  PHYSICAL EXAMINATION: . Filed Vitals:   07/27/15 1239 07/27/15 1311 07/27/15 1458 07/27/15 1731  BP: 118/76 117/79 124/59 115/67  Pulse: 41 41 59 56  Temp: 97.5 F (36.4 C) 98.2 F (36.8 C) 97.9 F (36.6 C) 98.1 F (36.7 C)  TempSrc:      Resp: _0 Height:      Weight:      SpO2: 97% 98% 99% 97%   Filed Weights   07/24/15 0500 07/26/15 0700 07/27/15 0505  Weight: 200 lb (90.719 kg) 195 lb 11.2 oz (88.769 kg) 194 lb 3.2 oz (88.089 kg)   .Body mass index is 27.1 kg/(m^2).  GENERAL: SKIN:no acute rashes. EYES: normal, conjunctiva are pink and non-injected, sclera clear OROPHARYNX:no exudate, no erythema and lips, buccal mucosa, and tongue normal  NECK: supple, no JVD, thyroid normal size, non-tender, without nodularity LYMPH:  no palpable lymphadenopathy in the cervical, axillary or inguinal LUNGS: CTA bilaterally HEART: regular rate & rhythm,  no murmurs and trace lower extremity edema ABDOMEN: abdomen mildly distended (improved), BS normoactive, laparoscopic incisions clean and healing well. PSYCH: alert & oriented x 3 with fluent speech NEURO: no focal motor/sensory deficits  MEDICAL HISTORY:  Past Medical History  Diagnosis Date  . EBV infection 2013    Patient notes that he had an EBV infection in 2013 that left him with chronic fatigue. She also notes that he had significant lymphadenopathy and thyroiditis at the time. He has  been managing his symptoms with an alternative medicine practitioner in [place] and with other alternative medicines.  . Marijuana use, episodic     SURGICAL HISTORY: Past Surgical History  Procedure Laterality Date  . Tonsillectomy    . Appendectomy    . Laparoscopy N/A 07/20/2015    Procedure: LAPAROSCOPY DIAGNOSTIC, MULTIPLE BIOPSIES, DRAINAGE OF ASCITES;  Surgeon: Fanny Skates, MD;  Location: [facility];  Service: General;  Laterality: N/A;    SOCIAL HISTORY: Social History   Social History  . Marital Status: Married    Spouse Name: N/A  . Number of Children: N/A  . Years of Education: N/A   Occupational History  . Not on file.   Social History Main Topics  . Smoking status: Never Smoker   . Smokeless tobacco: Not on file  . Alcohol Use: No  . Drug Use: 1.00 per week    Special: Marijuana  . Sexual Activity: Not on file   Other Topics Concern  . 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   He has a fiance and has been in a steady relationship.      He has tried to maintain a very healthy lifestyle and cycles about 100 miles a week. He also uses a fair number of over-the-counter alternative medicines to stay healthy.    FAMILY HISTORY: Family History  Problem Relation Age of Onset  . Heart  failure Father     ALLERGIES:  is allergic to fluoride preparations and sulfa antibiotics.  MEDICATIONS:  Scheduled Meds: . allopurinol  150 mg Oral BID  . DOXOrubicin/vinCRIStine/etoposide CHEMO IV infusion for Inpatient CI   Intravenous Once  . enoxaparin (LOVENOX) injection  40 mg Subcutaneous Q24H  . lidocaine      . pantoprazole  40 mg Oral BID AC  . polyethylene glycol  17 g Oral BID  . predniSONE  120 mg Oral QAC breakfast  . senna-docusate  2 tablet Oral QHS  . sodium chloride  10-40 mL Intracatheter Q12H   Continuous Infusions: . sodium chloride 10 mL/hr at  07/15/15 1023  . sodium chloride 10 mL/hr at 07/25/15 1056  .  sodium bicarbonate  infusion 1000 mL 75 mL/hr at 07/27/15 1444   PRN Meds:.alteplase, Cold Pack, fludeoxyglucose F - 18, gi cocktail, heparin lock flush, heparin lock flush, Hot Pack, HYDROcodone-acetaminophen, LORazepam, morphine injection, ondansetron (ZOFRAN) IV, rasburicase (ELITEK) IV infusion, sodium chloride, sodium chloride, sodium chloride  REVIEW OF SYSTEMS:    10 Point review of Systems was done is negative except as noted above.   LABORATORY DATA:  I have reviewed the data as listed  . CBC Latest Ref Rng 07/27/2015 07/26/2015 07/25/2015  WBC 4.0 - 10.5 K/uL 15.1(H) 13.4(H) 9.3  Hemoglobin 13.0 - 17.0 g/dL 12.1(L) 12.9(L) 12.9(L)  Hematocrit 39.0 - 52.0 % 35.3(L) 37.3(L) 38.6(L)  Platelets 150 - 400 K/uL 248 252 235   . CBC    Component Value Date/Time   WBC 15.1* 07/27/2015 0437   RBC 4.17* 07/27/2015 0437   RBC 5.39 07/18/2015 0920   HGB 12.1* 07/27/2015 0437   HCT 35.3* 07/27/2015 0437   PLT 248 07/27/2015 0437   MCV 84.7 07/27/2015 0437   MCH 29.0 07/27/2015 0437   MCHC 34.3 07/27/2015 0437   RDW 12.9 07/27/2015 0437   LYMPHSABS 1.3 07/27/2015 0437   MONOABS 1.2* 07/27/2015 0437   EOSABS 0.0 07/27/2015 0437   BASOSABS 0.0 07/27/2015 0437      . CMP Latest Ref Rng 07/27/2015 07/27/2015 07/26/2015  Glucose 65 - 99 mg/dL 145(H) 137(H) 157(H)  BUN 6 - 20 mg/dL 25(H) 25(H) 26(H)  Creatinine 0.61 - 1.24 mg/dL 0.92 0.87 0.95  Sodium 135 - 145 mmol/L 137 138 137  Potassium 3.5 - 5.1 mmol/L 3.8 3.9 4.2  Chloride 101 - 111 mmol/L 106 107 105  CO2 22 - 32 mmol/L _0 Calcium 8.9 - 10.3 mg/dL 8.1(L) 8.3(L) 8.0(L)  Total Protein 6.5 - 8.1 g/dL 5.4(L) 5.1(L) 5.7(L)  Total Bilirubin 0.3 - 1.2 mg/dL 0.5 0.6 0.4  Alkaline Phos 38 - 126 U/L 52 48 49  AST 15 - 41 U/L 119(H) 41 51(H)  ALT 17 - 63 U/L 153(H) 82(H) 97(H)   Component     Latest Ref Rng 07/24/2015  Tube #      1  Color, CSF      COLORLESS COLORLESS  Appearance, CSF     CLEAR CLEAR (A)  Supernatant      NOT INDICATED  RBC Count, CSF     0 /cu mm 2 (H)  WBC, CSF     0 - 5 /cu mm 1  Other Cells, CSF      TOO FEW TO COUNT, SMEAR AVAILABLE FOR REVIEW  Specimen Description      CSF  Special Requests      NONE  Gram Stain      WBC PRESENT, PREDOMINANTLY  MONONUCLEAR . . .  Culture      PENDING  Report Status      PENDING  Glucose, CSF     40 - 70 mg/dL 59  Total  Protein, CSF     15 - 45 mg/dL 15     RADIOGRAPHIC STUDIES: I have personally reviewed the radiological images as listed and agreed with the findings in the report. Dg Chest 2 View  07/15/2015   CLINICAL DATA:  Followup right pleural effusion, status post right thoracentesis.  EXAM: CHEST  2 VIEW  COMPARISON:  Yesterday.  FINDINGS: Stable right upper companion rib shadows. No pneumothorax seen. Significantly decreased right pleural fluid. The interstitial markings remain mildly prominent. Normal sized heart. Interval mild left lower lobe linear density. Unremarkable bones.  IMPRESSION: 1. No pneumothorax following right thoracentesis. 2. Significantly less right pleural fluid. 3. Interval mild left lower lobe atelectasis. 4. Stable mild chronic interstitial lung disease.   Electronically Signed   By: Claudie Revering M.D.   On: 07/15/2015 17:21   Dg Chest 2 View  07/14/2015   CLINICAL DATA:  Right-sided chest pain for 2-3 days. Shortness of breath.  EXAM: CHEST  2 VIEW  COMPARISON:  07/12/2015  FINDINGS: Heart size is probably normal. Suspect lung opacity at the right base in addition to the elevated hemidiaphragm. Opacity at the right lung base may be related to subpulmonic effusion. Overall the appearance is stable since previous exam. There is a trace left pleural effusion. No pulmonary edema.  IMPRESSION: Persistent significant opacity at the right lung base. Consider right lateral decubitus view to determine component of layering pleural effusion.    Electronically Signed   By: Nolon Nations M.D.   On: 07/14/2015 16:56   Dg Chest 2 View  07/12/2015   CLINICAL DATA:  Anterior chest pain for 1 week with cough  EXAM: CHEST  2 VIEW  COMPARISON:  None.  FINDINGS: There is mild elevation the right hemidiaphragm. There is evidence suggesting consolidation in the medial right base. Lungs elsewhere clear. Heart size and pulmonary vascularity are normal. No adenopathy. No bone lesions.  IMPRESSION: Evidence of a degree of consolidation in the medial right base. There is elevation of the right hemidiaphragm. Lungs elsewhere clear. Cardiac silhouette within normal limits.  Followup PA and lateral chest radiographs recommended in 3-4 weeks following trial of antibiotic therapy to ensure resolution and exclude underlying malignancy.   Electronically Signed   By: Lowella Grip III M.D.   On: 07/12/2015 15:52   Ct Angio Chest Pe W/cm &/or Wo Cm  07/14/2015   CLINICAL DATA:  Right chest pain  EXAM: CT ANGIOGRAPHY CHEST WITH CONTRAST  TECHNIQUE: Multidetector CT imaging of the chest was performed using the standard protocol during bolus administration of intravenous contrast. Multiplanar CT image reconstructions and MIPs were obtained to evaluate the vascular anatomy.  CONTRAST:  147m OMNIPAQUE IOHEXOL 350 MG/ML SOLN  COMPARISON:  Chest x-ray 07/14/2015  FINDINGS: Negative for pulmonary embolism. Negative for aortic dissection or aneurysm  Moderately large right pleural effusion with compressive atelectasis of the right lower lobe. Atelectasis also present in the right middle lobe. Partial collapse of the right upper lobe medially adjacent to the mediastinum. This extends into the right upper lobe laterally. Small left pleural effusion. No significant infiltrate on the left.  No adenopathy is present in the mediastinum.  No acute bony abnormality  In the upper abdomen, there is mild to moderate ascites. This could be due to liver  disease. There is fluid around the  liver and spleen. The spleen is not enlarged. Limited imaging of the pancreas and kidneys are grossly normal. There is soft tissue infiltration in the omentum. Carcinoma not excluded.  Review of the MIP images confirms the above findings.  IMPRESSION: Negative for pulmonary embolism  Moderate right pleural effusion with compressive atelectasis in the right lower lobe. Additional atelectasis in the right middle lobe and right upper lobe. No definite mass or pneumonia however infection and tumor are in the differential. There is a small left effusion  Concerning findings in the abdomen. There is ascites around the liver and spleen. There is soft tissue density involving the omentum which can be seen with carcinoma. Further imaging of the abdomen with CT abdomen pelvis with contrast is suggested.   Electronically Signed   By: Franchot Gallo M.D.   On: 07/14/2015 18:28   Ct Abdomen Pelvis W Contrast  07/15/2015   CLINICAL DATA:  39 year old male with ascites and potential omental disease noted on prior chest CT. Follow-up abdominal CT scan.  EXAM: CT ABDOMEN AND PELVIS WITH CONTRAST  TECHNIQUE: Multidetector CT imaging of the abdomen and pelvis was performed using the standard protocol following bolus administration of intravenous contrast.  CONTRAST:  155m OMNIPAQUE IOHEXOL 300 MG/ML  SOLN  COMPARISON:  CT of the chest 07/14/2015. CT of the pelvis 03/03/2008.  FINDINGS: Lower chest: Small bilateral pleural effusions (left greater than right). Subsegmental atelectasis in the posterior aspect of the right lower lobe.  Hepatobiliary: No cystic or solid hepatic lesion identified. No intra or extrahepatic biliary ductal dilatation. High attenuation material filling the gallbladder, presumably vicarious excretion of contrast material from the recent chest CT.  Pancreas: No pancreatic mass. No pancreatic ductal dilatation. No pancreatic or peripancreatic fluid or inflammatory changes.  Spleen: Unremarkable.   Adrenals/Urinary Tract: Bilateral adrenal glands and bilateral kidneys are unremarkable in appearance. No hydroureteronephrosis. Urinary bladder is unremarkable in appearance.  Stomach/Bowel: In the mid small bowel (like mid to distal jejunum) there is a profoundly asymmetrically thickened loop of bowel best demonstrated on image 56 of series 2, where the wall measures up to 22 mm in thickness medially. Adjacent to this there are soft tissue masses extending into the root of the small bowel mesentery, largest of which measures 2.7 x 3.3 cm (image 47 of series 2), likely malignant lymph nodes. The appendix is not confidently identified. No pathologic dilatation of small bowel or colon. Stomach is grossly normal in appearance.  Vascular/Lymphatic: No significant atherosclerotic disease, aneurysm or dissection identified in the abdominal or pelvic vasculature. Circumaortic left renal vein (normal anatomical variant) incidentally noted. Probable mesenteric lymphadenopathy, as discussed above.  Reproductive: Prostate gland and seminal vesicles are unremarkable in appearance. Inguinal canals and visualized portions of the scrotum were grossly unremarkable in appearance. No lymphadenopathy noted along the gonadal drainage system.  Other: Large amount of abnormal enhancing soft tissue throughout the peritoneal cavity, most notable in the omentum, particularly in the right upper quadrant where there is bulky omental caking. Small volume of presumably malignant ascites. No pneumoperitoneum. Some abnormal soft tissue is extending through the anterior abdominal wall into the periumbilical region (image 53 of series 2).  Musculoskeletal: There are no aggressive appearing lytic or blastic lesions noted in the visualized portions of the skeleton.  IMPRESSION: 1. Widespread intraperitoneal malignancy, as demonstrated by extensive peritoneal nodularity and enhancement, widespread omental caking, likely malignant ascites and  extensive mesenteric lymphadenopathy. The exact source of malignancy is uncertain,  however, given the focally thickened loop of small bowel (likely jejunal) in the left side of the central abdomen (image 56 of series 2), a primary small bowel malignancy is suspected. 2. Small bilateral pleural effusions (left greater than right). Right-sided pleural effusion significantly decreased following thoracentesis. Resolving subsegmental atelectasis in the right lower lobe. 3. Additional incidental findings, as above. These results were called by telephone at the time of interpretation on 07/15/2015 at 5:33 pm to Dr. Olevia Bowens, who verbally acknowledged these results.   Electronically Signed   By: Vinnie Langton M.D.   On: 07/15/2015 17:34   Nm Pet Image Initial (pi) Skull Base To Thigh  07/23/2015   CLINICAL DATA:  Initial treatment strategy for high-grade non-Hodgkin's lymphoma. B-cell lymphoma.  EXAM: NUCLEAR MEDICINE PET SKULL BASE TO THIGH  TECHNIQUE: 10.0 mCi F-18 FDG was injected intravenously. Full-ring PET imaging was performed from the skull base to thigh after the radiotracer. CT data was obtained and used for attenuation correction and anatomic localization.  FASTING BLOOD GLUCOSE:  Value: 88 mg/dl  COMPARISON:  CT abdomen 07/15/2015, CT thorax 07/14/2015  FINDINGS: NECK  Small hypermetabolic lymph nodes the just deep to the RIGHT clavicle measure 9 mm short axis on image 54, series 4 with intense metabolic activity (SUV max 6.2).  CHEST  Small hypermetabolic internal mammary lymph nodes on the RIGHT (image 66 with SUV max equal 5.). Small hypermetabolic nodule posterior to the sternum in the anterior mediastinum on image 69 of fused data set.  Within the precordial space anterior to the liver there is a 4.5 cm hypermetabolic mass. Smaller hypermetabolic nodule anterior to the RIGHT ventricle.  ABDOMEN/PELVIS  There is a thin rim of intense metabolic activity associated entirety of the peritoneal surface consists  with peritoneal metastasis / carcinomatosis. There is more focal activity within the LEFT abdomen associated with the small bowel wall thickening. This segmental thickening measures 17 mm in single wall thickness with intense metabolic activity (SUV max 14.6). Central mesenteric mass measures 3 cm with SUV max 13.2.  There are no hypermetabolic inguinal or iliac lymph nodes.  SKELETON  No focal hypermetabolic activity to suggest skeletal metastasis.  IMPRESSION: 1. Thin rim of hypermetabolic activity throughout the peritoneal space consistent with peritoneal carcinomatosis. 2. Focal intense metabolic activity associated with the proximal small bowel consistent with lymphoma involvement of bowel. 3. Hypermetabolic mesenteric nodule  /node consistent with lymphoma. 4. Several small hypermetabolic lymph nodes in the RIGHT supraclavicular and internal mammary nodal stations. 5. Nodal metastasis anterior to the liver in the precordial space. This may be the most assessable for biopsy. 6. Normal spleen and bone marrow.   Electronically Signed   By: Suzy Bouchard M.D.   On: 07/23/2015 11:35   US Biopsy  07/17/2015   CLINICAL DATA:  Extensive peritoneal nodularity and omental caking of uncertain etiology. Periumbilical masses.  EXAM: ULTRASOUND-GUIDED CORE PERIUMBILICAL BIOPSY  TECHNIQUE: An ultrasound guided biopsy was thoroughly discussed with the patient and questions were answered. The benefits, risks, alternatives, and complications were also discussed. The patient understands and wishes to proceed with the procedure. A verbal as well as written consent was obtained.  Survey ultrasound of the periumbilical region was performed and an appropriate skin entry site was determined. Skin site was marked, prepped with chlorhexidine, and draped in usual sterile fashion, and infiltrated locally with 1% lidocaine.  Intravenous Fentanyl and Versed were administered as conscious sedation during continuous cardiorespiratory  monitoring by the radiology RN, with a total moderate  sedation time of less than 30 minutes.  Under real-time ultrasound guidance, a 17 gauge trocar needle was advanced to the margin of the lesion. Once needle tip position was confirmed, coaxial 18-gauge core biopsy samples were obtained, submitted in saline to surgical pathology. The guide needle was removed. Postprocedure scans show no hemorrhage or other apparent complication.  COMPLICATIONS: COMPLICATIONS None immediate  FINDINGS: Nodular periumbilical subcutaneous masses were localized. Core biopsy samples were obtained without complication.  IMPRESSION: 1. Technically successful ultrasound guided core periumbilical nodule biopsy.   Electronically Signed   By: Lucrezia Europe M.D.   On: 07/17/2015 16:15   US Paracentesis  07/17/2015   CLINICAL DATA:  Omental disease and peritoneal nodules.  EXAM: ULTRASOUND GUIDED PARACENTESIS  TECHNIQUE: The procedure, risks (including but not limited to bleeding, infection, organ damage ), benefits, and alternatives were explained to the patient. Questions regarding the procedure were encouraged and answered. The patient understands and consents to the procedure. Survey ultrasound of the abdomen was performed and an appropriate skin entry site in the right lower abdomen was selected. Skin site was marked, prepped with Betadine, and draped in usual sterile fashion, and infiltrated locally with 1% lidocaine. A 5 French multisidehole Yueh sheath needle was advanced into the peritoneal space until fluid could be aspirated. The sheath was advanced and the needle removed. 1.2 L of cloudy yellow ascites were aspirated. A sample sent to cytology for the requested studies.  COMPLICATIONS: COMPLICATIONS none  IMPRESSION: Technically successful ultrasound guided paracentesis, removing 1.2 L of ascites. Sample to cytology.   Electronically Signed   By: Lucrezia Europe M.D.   On: 07/17/2015 16:17   Ir Fluoro Guide Cv Line Right  07/27/2015    CLINICAL DATA:  New diagnosis of Burkitt's lymphoma. The patient requires a porta cath for chemotherapy.  EXAM: IMPLANTED PORT A CATH PLACEMENT WITH ULTRASOUND AND FLUOROSCOPIC GUIDANCE  ANESTHESIA/SEDATION: 6.0 Mg IV Versed; 150 mcg IV Fentanyl  Total Moderate Sedation Time:  30 minutes  Additional Medications: 2 g IV Ancef. As antibiotic prophylaxis, Ancef was ordered pre-procedure and administered intravenously within one hour of incision.  FLUOROSCOPY TIME:  24 seconds.  PROCEDURE: The procedure, risks, benefits, and alternatives were explained to the patient. Questions regarding the procedure were encouraged and answered. The patient understands and consents to the procedure. A time-out was performed prior to the procedure.  Ultrasound was used to confirm patency of the right internal jugular vein. The right neck and chest were prepped with chlorhexidine in a sterile fashion, and a sterile drape was applied covering the operative field. Maximum barrier sterile technique with sterile gowns and gloves were used for the procedure. Local anesthesia was provided with 1% lidocaine.  After creating a small venotomy incision, a 21 gauge needle was advanced into the right internal jugular vein under direct, real-time ultrasound guidance. Ultrasound image documentation was performed. After securing guidewire access, an 8 Fr dilator was placed. A J-wire was kinked to measure appropriate catheter length.  A subcutaneous port pocket was then created along the upper chest wall utilizing sharp and blunt dissection. Portable cautery was utilized. The pocket was irrigated with sterile saline.  A single lumen power injectable port was chosen for placement. The 8 Fr catheter was tunneled from the port pocket site to the venotomy incision. The port was placed in the pocket. External catheter was trimmed to appropriate length based on guidewire measurement.  At the venotomy, an 8 Fr peel-away sheath was placed over a guidewire. The  catheter was then placed through the sheath and the sheath removed. Final catheter positioning was confirmed and documented with a fluoroscopic spot image. The port was accessed with a needle and aspirated and flushed with heparinized saline. The needle was left in place for immediate use.  The venotomy and port pocket incisions were closed with subcutaneous 3-0 Monocryl and subcuticular 4-0 Vicryl. Dermabond was applied to both incisions.  COMPLICATIONS: None  FINDINGS: After catheter placement, the tip lies at the cavoatrial junction. The catheter aspirates normally and is ready for immediate use.  IMPRESSION: Placement of single lumen port a cath via right internal jugular vein. The catheter tip lies at the cavoatrial junction. A power injectable port a cath was placed and is ready for immediate use.   Electronically Signed   By: Aletta Edouard M.D.   On: 07/27/2015 15:28   Ir US Guide Vasc Access Right  07/27/2015   CLINICAL DATA:  New diagnosis of Burkitt's lymphoma. The patient requires a porta cath for chemotherapy.  EXAM: IMPLANTED PORT A CATH PLACEMENT WITH ULTRASOUND AND FLUOROSCOPIC GUIDANCE  ANESTHESIA/SEDATION: 6.0 Mg IV Versed; 150 mcg IV Fentanyl  Total Moderate Sedation Time:  30 minutes  Additional Medications: 2 g IV Ancef. As antibiotic prophylaxis, Ancef was ordered pre-procedure and administered intravenously within one hour of incision.  FLUOROSCOPY TIME:  24 seconds.  PROCEDURE: The procedure, risks, benefits, and alternatives were explained to the patient. Questions regarding the procedure were encouraged and answered. The patient understands and consents to the procedure. A time-out was performed prior to the procedure.  Ultrasound was used to confirm patency of the right internal jugular vein. The right neck and chest were prepped with chlorhexidine in a sterile fashion, and a sterile drape was applied covering the operative field. Maximum barrier sterile technique with sterile gowns  and gloves were used for the procedure. Local anesthesia was provided with 1% lidocaine.  After creating a small venotomy incision, a 21 gauge needle was advanced into the right internal jugular vein under direct, real-time ultrasound guidance. Ultrasound image documentation was performed. After securing guidewire access, an 8 Fr dilator was placed. A J-wire was kinked to measure appropriate catheter length.  A subcutaneous port pocket was then created along the upper chest wall utilizing sharp and blunt dissection. Portable cautery was utilized. The pocket was irrigated with sterile saline.  A single lumen power injectable port was chosen for placement. The 8 Fr catheter was tunneled from the port pocket site to the venotomy incision. The port was placed in the pocket. External catheter was trimmed to appropriate length based on guidewire measurement.  At the venotomy, an 8 Fr peel-away sheath was placed over a guidewire. The catheter was then placed through the sheath and the sheath removed. Final catheter positioning was confirmed and documented with a fluoroscopic spot image. The port was accessed with a needle and aspirated and flushed with heparinized saline. The needle was left in place for immediate use.  The venotomy and port pocket incisions were closed with subcutaneous 3-0 Monocryl and subcuticular 4-0 Vicryl. Dermabond was applied to both incisions.  COMPLICATIONS: None  FINDINGS: After catheter placement, the tip lies at the cavoatrial junction. The catheter aspirates normally and is ready for immediate use.  IMPRESSION: Placement of single lumen port a cath via right internal jugular vein. The catheter tip lies at the cavoatrial junction. A power injectable port a cath was placed and is ready for immediate use.   Electronically Signed   By:  Aletta Edouard M.D.   On: 07/27/2015 15:28   Ct Biopsy  07/24/2015   CLINICAL DATA:  39 year old male with a new diagnosis of lymphoma.  EXAM: CT BIOPSY  Date:  07/24/2015  PROCEDURE: 1. CT guided bone marrow aspiration and core biopsy Interventional Radiologist:  Criselda Peaches, MD  ANESTHESIA/SEDATION: Moderate (conscious) sedation was used. 2.5 mg Versed, 100 mcg Fentanyl were administered intravenously. The patient's vital signs were monitored continuously by radiology nursing throughout the procedure.  Sedation Time: 9 minutes  FLUOROSCOPY TIME:  None  TECHNIQUE: Informed consent was obtained from the patient following explanation of the procedure, risks, benefits and alternatives. The patient understands, agrees and consents for the procedure. All questions were addressed. A time out was performed.  The patient was positioned prone and noncontrast localization CT was performed of the pelvis to demonstrate the iliac marrow spaces.  Maximal barrier sterile technique utilized including caps, mask, sterile gowns, sterile gloves, large sterile drape, hand hygiene, and betadine prep.  Under sterile conditions and local anesthesia, an 11 gauge coaxial bone biopsy needle was advanced into the right iliac marrow space. Needle position was confirmed with CT imaging. Initially, bone marrow aspiration was performed. Next, the 11 gauge outer cannula was utilized to obtain a right iliac bone marrow core biopsy. Needle was removed. Hemostasis was obtained with compression. The patient tolerated the procedure well. Samples were prepared with the cytotechnologist. No immediate complications.  IMPRESSION: CT guided right iliac bone marrow aspiration and core biopsy. The On-Control drill was used.  Signed,  Criselda Peaches, MD  [facility]  [facility]   Electronically Signed   By: Jacqulynn Cadet M.D.   On: 07/24/2015 12:32   Dg Chest Port 1 View  07/22/2015   CLINICAL DATA:  Pulmonary edema, hypoxia  EXAM: PORTABLE CHEST - 1 VIEW  COMPARISON:  07/21/2015  FINDINGS: Heart size is normal. Right-sided PICC line in place with  tip over the cavoatrial junction. Hazy perihilar rate greater than left basilar airspace opacities are slightly decreased. Trace pleural effusions.  IMPRESSION: Interval slight decrease in hazy perihilar airspace opacities compatible with improving edema.   Electronically Signed   By: Conchita Paris M.D.   On: 07/22/2015 08:10   Dg Chest Port 1 View  07/21/2015   CLINICAL DATA:  Acute respiratory failure with hypoxemia. Nonsmoker.  EXAM: PORTABLE CHEST - 1 VIEW  COMPARISON:  07/20/2015.  FINDINGS: Cardiomediastinal silhouette appears within normal limits for size. Diffuse pulmonary opacities persist but are slightly improved consistent with resolving edema. Decreased BILATERAL pleural effusions. PICC line tip SVC. No pneumothorax.  IMPRESSION: Improving pulmonary edema.   Electronically Signed   By: Staci Righter M.D.   On: 07/21/2015 09:38   Dg Chest Port 1 View  07/20/2015   CLINICAL DATA:  Shortness of Breath  EXAM: PORTABLE CHEST - 1 VIEW  COMPARISON:  July 15, 2015  FINDINGS: There is alveolar edema throughout both lungs. There are small bilateral effusions. Heart is upper normal in size. There is mild pulmonary venous hypertension. No adenopathy.  IMPRESSION: Extensive edema. Question a degree of congestive heart failure versus noncardiogenic edema. Note that cardiac silhouette is unchanged compared to recent prior study. No adenopathy apparent.   Electronically Signed   By: Lowella Grip III M.D.   On: 07/20/2015 15:36   Dg Fluoro Guide Lumbar Puncture  07/24/2015   CLINICAL DATA:  Lymphoma  EXAM: DIAGNOSTIC LUMBAR PUNCTURE UNDER FLUOROSCOPIC GUIDANCE  FLUOROSCOPY TIME:  Radiation Exposure Index (as provided by the fluoroscopic device):  If the device does not provide the exposure index:  Fluoroscopy Time (in minutes and seconds):  13 seconds  Number of Acquired Images:  0  PROCEDURE: Informed consent was obtained from the patient prior to the procedure, including potential complications of  headache, allergy, and pain. With the patient prone, the lower back was prepped with Betadine. 1% Lidocaine was used for local anesthesia. Lumbar puncture was performed at the L3-4 level using a 22 gauge needle with return of clear CSF with an opening pressure of 14 cm water. 12 ml of CSF were obtained for laboratory studies. The patient tolerated the procedure well and there were no apparent complications.  IMPRESSION: 1. Technically successful lumbar puncture. 12 cc of clear CSF were obtained for laboratory studies.   Electronically Signed   By: Kerby Moors M.D.   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 ? EBV related given h/o bothersome infection in 2013. PET/CT with significant intra-abdominal involvement, small intestinal involvement, some chest LNadenopathy as noted above. Bone Marrow Biopsy - no evidence of lymphoma CSF - no overt evidence of CNS involvement by lymphoma 2) Post-operative hypoxic respiratory failure with CXR showing b/l infiltrates ?Fluid overload - resolved with light diuresis. 2) Spontaneous TLS - uric acid normalized with allopurinol. Some bump in LFts now improved. Restarted on lower dose of Allopurinol. Some increase in phosphorus consistent with tumor lysis. 3) AKI ?related to contrast/urate nephropathy less likely urinary obstruction. Creatinine has now improved from 1.54 to 1.1 to 0.9 4) Elevated transaminases - ? Related to allopurinol vs lymphomatous involvement. 5) Elevated free T4 - ? Related to his supplements vs ?thyroiditis vs paraneoplastic process. 6) Asymptomatic bradycardia - physiologic to a great extent and with sedation. Doubt acute anthracycline toxicity. conitnue to monitor. If hypotensive or symptomatic will need telemetry. Plan -continue current dose of allopurinol -continue  IVF with bicarb for TLS protection -prn lasix if signs of fluid overload. -G-6PD (normal), since we might need to use rasburicase. -continue EPOCH-R started 07/24/2014 with Rituxan day 5. -will need neulasta day 6 or 7 as outpatient. (on 07/31/2015) -clinic followup on 08/03/2015 -cbc, phosphorus, magnesium daily, cmp and uric acid twice daily -Rasburicase 47m IVPB daily prn for uric acid >10 (has not  Needed this) -IT Methotrexate for CNS prophylaxis Day 1 and Day 5 from cycle 2 or 3 (Started in cycle 3 in the NEJM study) -rpt PET/CT after 2 cycles. -patient PICC line was leaking - removed. Patient had port placement today.  Will continue to follow daily. Appreciate cares by hospitalist team. Kindly call if questions.   GSullivan LoneMD MCrestviewAAHIVMS SBell Memorial HospitalCEye Surgery Center Of Georgia LLCHematology/Oncology Physician [facility]End[facility] (Office):       [phone](Work cell):  [phone](Fax):           [phone] 07/27/2015 10:08 PM
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
## 2015-07-27 NOTE — Progress Notes (Signed)
Spoke to Dr Kathlene Cote with IR about the PICC leaking and need for it to be "checked out" and/or replaced. Dr Kathlene Cote stated that there is no CVC insertion done at night and that it could be replaced in the am. Told to flush the ports and assess for leakage and that IVF's with the bicarb could still be run through it tonight.

## 2015-07-27 NOTE — Progress Notes (Signed)
Spoke to [facility] and she had paged Dr Kathlene Cote twice in last 20 minutes without a return call. Asked if we could try to page him. She was not on campus at present.

## 2015-07-27 NOTE — Progress Notes (Signed)
Spoke with Web designer with the IV team about the "plan" for exchanging PICC in am but restarting bicarb gtt tonight.

## 2015-07-27 NOTE — H&P (Signed)
Chief Complaint: Patient was seen in consultation today for [facility] a cath placement Chief Complaint  Patient presents with  . Flank Pain   at the request of Dr Irene Limbo  Referring Physician(s): Dr Carolyne Fiscal  History of Present Illness: Donald [name] is a 39 y.o. male   Non Hodgkin's / Burkitt Lymphoma Needs Port a Cath placement for treatment to ensue Scheduled now for same  Past Medical History  Diagnosis Date  . EBV infection 2013    Patient notes that he had an EBV infection in 2013 that left him with chronic fatigue. She also notes that he had significant lymphadenopathy and thyroiditis at the time. He has been managing his symptoms with an alternative medicine practitioner in [place] and with other alternative medicines.  . Marijuana use, episodic     Past Surgical History  Procedure Laterality Date  . Tonsillectomy    . Appendectomy    . Laparoscopy N/A 07/20/2015    Procedure: LAPAROSCOPY DIAGNOSTIC, MULTIPLE BIOPSIES, DRAINAGE OF ASCITES;  Surgeon: Fanny Skates, MD;  Location: [facility];  Service: General;  Laterality: N/A;    Allergies: Fluoride preparations and Sulfa antibiotics  Medications: Prior to Admission medications   Medication Sig Start Date End Date Taking? Authorizing Provider  Multiple Vitamin (MULTIVITAMIN) tablet Take 1 tablet by mouth daily.   Yes Historical Provider, MD  gabapentin (NEURONTIN) 300 MG capsule Take 1 capsule (300 mg total) by mouth at bedtime. Patient not taking: Reported on 07/15/2015 07/13/15   Eulas Post, MD  HYDROcodone-acetaminophen (NORCO) 7.5-325 MG per tablet Take 1-2 tablets by mouth every 6 (six) hours as needed for moderate pain. 07/15/15   Charlynne Cousins, MD     Family History  Problem Relation Age of Onset  . Heart failure Father     Social History   Social History  . Marital Status: Married    Spouse Name: N/A  . Number of Children: N/A  . Years of Education: N/A   Social History Main Topics  .  Smoking status: Never Smoker   . Smokeless tobacco: None  . Alcohol Use: No  . Drug Use: 1.00 per week    Special: Marijuana  . Sexual Activity: Not Asked   Other Topics Concern  . 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   He has a fiance and has been in a steady relationship.      He has tried to maintain a very healthy lifestyle and cycles about 100 miles a week. He also uses a fair number of over-the-counter alternative medicines to stay healthy.    Review of Systems: A 12 point ROS discussed and pertinent positives are indicated in the HPI above.  All other systems are negative.  Review of Systems  Constitutional: Negative for fever and activity change.  Respiratory: Negative for cough and shortness of breath.   Gastrointestinal: Positive for abdominal pain.  Psychiatric/Behavioral: Negative for behavioral problems and confusion.    Vital Signs: BP 113/66 mmHg  Pulse 51  Temp(Src) 98.2 F (36.8 C) (Oral)  Resp 20  Ht 5\' 11"  (1.803 m)  Wt 194 lb 3.2 oz (88.089 kg)  BMI 27.10 kg/m2  SpO2 96%  Physical Exam  Constitutional: He is oriented to person, place, and time.  Cardiovascular: Normal rate, regular rhythm and normal heart sounds.   Pulmonary/Chest: Effort normal.  Abdominal: Soft. Bowel sounds are normal.  Musculoskeletal:  Normal range of motion.  Neurological: He is alert and oriented to person, place, and time.  Skin: Skin is warm and dry.  Psychiatric: He has a normal mood and affect. His behavior is normal. Thought content normal.  Nursing note and vitals reviewed.   Mallampati Score:  MD Evaluation Airway: WNL Heart: WNL Abdomen: WNL Chest/ Lungs: WNL ASA  Classification: 3 Mallampati/Airway Score: One  Imaging: Dg Chest 2 View  07/15/2015   CLINICAL DATA:  Followup right pleural effusion, status post right thoracentesis.  EXAM: CHEST  2 VIEW  COMPARISON:   Yesterday.  FINDINGS: Stable right upper companion rib shadows. No pneumothorax seen. Significantly decreased right pleural fluid. The interstitial markings remain mildly prominent. Normal sized heart. Interval mild left lower lobe linear density. Unremarkable bones.  IMPRESSION: 1. No pneumothorax following right thoracentesis. 2. Significantly less right pleural fluid. 3. Interval mild left lower lobe atelectasis. 4. Stable mild chronic interstitial lung disease.   Electronically Signed   By: Claudie Revering M.D.   On: 07/15/2015 17:21   Dg Chest 2 View  07/14/2015   CLINICAL DATA:  Right-sided chest pain for 2-3 days. Shortness of breath.  EXAM: CHEST  2 VIEW  COMPARISON:  07/12/2015  FINDINGS: Heart size is probably normal. Suspect lung opacity at the right base in addition to the elevated hemidiaphragm. Opacity at the right lung base may be related to subpulmonic effusion. Overall the appearance is stable since previous exam. There is a trace left pleural effusion. No pulmonary edema.  IMPRESSION: Persistent significant opacity at the right lung base. Consider right lateral decubitus view to determine component of layering pleural effusion.   Electronically Signed   By: Nolon Nations M.D.   On: 07/14/2015 16:56   Dg Chest 2 View  07/12/2015   CLINICAL DATA:  Anterior chest pain for 1 week with cough  EXAM: CHEST  2 VIEW  COMPARISON:  None.  FINDINGS: There is mild elevation the right hemidiaphragm. There is evidence suggesting consolidation in the medial right base. Lungs elsewhere clear. Heart size and pulmonary vascularity are normal. No adenopathy. No bone lesions.  IMPRESSION: Evidence of a degree of consolidation in the medial right base. There is elevation of the right hemidiaphragm. Lungs elsewhere clear. Cardiac silhouette within normal limits.  Followup PA and lateral chest radiographs recommended in 3-4 weeks following trial of antibiotic therapy to ensure resolution and exclude underlying  malignancy.   Electronically Signed   By: Lowella Grip III M.D.   On: 07/12/2015 15:52   Ct Angio Chest Pe W/cm &/or Wo Cm  07/14/2015   CLINICAL DATA:  Right chest pain  EXAM: CT ANGIOGRAPHY CHEST WITH CONTRAST  TECHNIQUE: Multidetector CT imaging of the chest was performed using the standard protocol during bolus administration of intravenous contrast. Multiplanar CT image reconstructions and MIPs were obtained to evaluate the vascular anatomy.  CONTRAST:  17mL OMNIPAQUE IOHEXOL 350 MG/ML SOLN  COMPARISON:  Chest x-ray 07/14/2015  FINDINGS: Negative for pulmonary embolism. Negative for aortic dissection or aneurysm  Moderately large right pleural effusion with compressive atelectasis of the right lower lobe. Atelectasis also present in the right middle lobe. Partial collapse of the right upper lobe medially adjacent to the mediastinum. This extends into the right upper lobe laterally. Small left pleural effusion. No significant infiltrate on the left.  No adenopathy is present in the mediastinum.  No acute bony abnormality  In the upper abdomen, there is mild to moderate ascites. This could be due to  liver disease. There is fluid around the liver and spleen. The spleen is not enlarged. Limited imaging of the pancreas and kidneys are grossly normal. There is soft tissue infiltration in the omentum. Carcinoma not excluded.  Review of the MIP images confirms the above findings.  IMPRESSION: Negative for pulmonary embolism  Moderate right pleural effusion with compressive atelectasis in the right lower lobe. Additional atelectasis in the right middle lobe and right upper lobe. No definite mass or pneumonia however infection and tumor are in the differential. There is a small left effusion  Concerning findings in the abdomen. There is ascites around the liver and spleen. There is soft tissue density involving the omentum which can be seen with carcinoma. Further imaging of the abdomen with CT abdomen pelvis  with contrast is suggested.   Electronically Signed   By: Franchot Gallo M.D.   On: 07/14/2015 18:28   Ct Abdomen Pelvis W Contrast  07/15/2015   CLINICAL DATA:  39 year old male with ascites and potential omental disease noted on prior chest CT. Follow-up abdominal CT scan.  EXAM: CT ABDOMEN AND PELVIS WITH CONTRAST  TECHNIQUE: Multidetector CT imaging of the abdomen and pelvis was performed using the standard protocol following bolus administration of intravenous contrast.  CONTRAST:  120mL OMNIPAQUE IOHEXOL 300 MG/ML  SOLN  COMPARISON:  CT of the chest 07/14/2015. CT of the pelvis 03/03/2008.  FINDINGS: Lower chest: Small bilateral pleural effusions (left greater than right). Subsegmental atelectasis in the posterior aspect of the right lower lobe.  Hepatobiliary: No cystic or solid hepatic lesion identified. No intra or extrahepatic biliary ductal dilatation. High attenuation material filling the gallbladder, presumably vicarious excretion of contrast material from the recent chest CT.  Pancreas: No pancreatic mass. No pancreatic ductal dilatation. No pancreatic or peripancreatic fluid or inflammatory changes.  Spleen: Unremarkable.  Adrenals/Urinary Tract: Bilateral adrenal glands and bilateral kidneys are unremarkable in appearance. No hydroureteronephrosis. Urinary bladder is unremarkable in appearance.  Stomach/Bowel: In the mid small bowel (like mid to distal jejunum) there is a profoundly asymmetrically thickened loop of bowel best demonstrated on image 56 of series 2, where the wall measures up to 22 mm in thickness medially. Adjacent to this there are soft tissue masses extending into the root of the small bowel mesentery, largest of which measures 2.7 x 3.3 cm (image 47 of series 2), likely malignant lymph nodes. The appendix is not confidently identified. No pathologic dilatation of small bowel or colon. Stomach is grossly normal in appearance.  Vascular/Lymphatic: No significant atherosclerotic  disease, aneurysm or dissection identified in the abdominal or pelvic vasculature. Circumaortic left renal vein (normal anatomical variant) incidentally noted. Probable mesenteric lymphadenopathy, as discussed above.  Reproductive: Prostate gland and seminal vesicles are unremarkable in appearance. Inguinal canals and visualized portions of the scrotum were grossly unremarkable in appearance. No lymphadenopathy noted along the gonadal drainage system.  Other: Large amount of abnormal enhancing soft tissue throughout the peritoneal cavity, most notable in the omentum, particularly in the right upper quadrant where there is bulky omental caking. Small volume of presumably malignant ascites. No pneumoperitoneum. Some abnormal soft tissue is extending through the anterior abdominal wall into the periumbilical region (image 53 of series 2).  Musculoskeletal: There are no aggressive appearing lytic or blastic lesions noted in the visualized portions of the skeleton.  IMPRESSION: 1. Widespread intraperitoneal malignancy, as demonstrated by extensive peritoneal nodularity and enhancement, widespread omental caking, likely malignant ascites and extensive mesenteric lymphadenopathy. The exact source of malignancy is uncertain,  however, given the focally thickened loop of small bowel (likely jejunal) in the left side of the central abdomen (image 56 of series 2), a primary small bowel malignancy is suspected. 2. Small bilateral pleural effusions (left greater than right). Right-sided pleural effusion significantly decreased following thoracentesis. Resolving subsegmental atelectasis in the right lower lobe. 3. Additional incidental findings, as above. These results were called by telephone at the time of interpretation on 07/15/2015 at 5:33 pm to Dr. Olevia Bowens, who verbally acknowledged these results.   Electronically Signed   By: Vinnie Langton M.D.   On: 07/15/2015 17:34   Nm Pet Image Initial (pi) Skull Base To  Thigh  07/23/2015   CLINICAL DATA:  Initial treatment strategy for high-grade non-Hodgkin's lymphoma. B-cell lymphoma.  EXAM: NUCLEAR MEDICINE PET SKULL BASE TO THIGH  TECHNIQUE: 10.0 mCi F-18 FDG was injected intravenously. Full-ring PET imaging was performed from the skull base to thigh after the radiotracer. CT data was obtained and used for attenuation correction and anatomic localization.  FASTING BLOOD GLUCOSE:  Value: 88 mg/dl  COMPARISON:  CT abdomen 07/15/2015, CT thorax 07/14/2015  FINDINGS: NECK  Small hypermetabolic lymph nodes the just deep to the RIGHT clavicle measure 9 mm short axis on image 54, series 4 with intense metabolic activity (SUV max 6.2).  CHEST  Small hypermetabolic internal mammary lymph nodes on the RIGHT (image 66 with SUV max equal 5.). Small hypermetabolic nodule posterior to the sternum in the anterior mediastinum on image 69 of fused data set.  Within the precordial space anterior to the liver there is a 4.5 cm hypermetabolic mass. Smaller hypermetabolic nodule anterior to the RIGHT ventricle.  ABDOMEN/PELVIS  There is a thin rim of intense metabolic activity associated entirety of the peritoneal surface consists with peritoneal metastasis / carcinomatosis. There is more focal activity within the LEFT abdomen associated with the small bowel wall thickening. This segmental thickening measures 17 mm in single wall thickness with intense metabolic activity (SUV max 14.6). Central mesenteric mass measures 3 cm with SUV max 13.2.  There are no hypermetabolic inguinal or iliac lymph nodes.  SKELETON  No focal hypermetabolic activity to suggest skeletal metastasis.  IMPRESSION: 1. Thin rim of hypermetabolic activity throughout the peritoneal space consistent with peritoneal carcinomatosis. 2. Focal intense metabolic activity associated with the proximal small bowel consistent with lymphoma involvement of bowel. 3. Hypermetabolic mesenteric nodule  /node consistent with lymphoma. 4.  Several small hypermetabolic lymph nodes in the RIGHT supraclavicular and internal mammary nodal stations. 5. Nodal metastasis anterior to the liver in the precordial space. This may be the most assessable for biopsy. 6. Normal spleen and bone marrow.   Electronically Signed   By: Suzy Bouchard M.D.   On: 07/23/2015 11:35   US Biopsy  07/17/2015   CLINICAL DATA:  Extensive peritoneal nodularity and omental caking of uncertain etiology. Periumbilical masses.  EXAM: ULTRASOUND-GUIDED CORE PERIUMBILICAL BIOPSY  TECHNIQUE: An ultrasound guided biopsy was thoroughly discussed with the patient and questions were answered. The benefits, risks, alternatives, and complications were also discussed. The patient understands and wishes to proceed with the procedure. A verbal as well as written consent was obtained.  Survey ultrasound of the periumbilical region was performed and an appropriate skin entry site was determined. Skin site was marked, prepped with chlorhexidine, and draped in usual sterile fashion, and infiltrated locally with 1% lidocaine.  Intravenous Fentanyl and Versed were administered as conscious sedation during continuous cardiorespiratory monitoring by the radiology RN, with a total moderate  sedation time of less than 30 minutes.  Under real-time ultrasound guidance, a 17 gauge trocar needle was advanced to the margin of the lesion. Once needle tip position was confirmed, coaxial 18-gauge core biopsy samples were obtained, submitted in saline to surgical pathology. The guide needle was removed. Postprocedure scans show no hemorrhage or other apparent complication.  COMPLICATIONS: COMPLICATIONS None immediate  FINDINGS: Nodular periumbilical subcutaneous masses were localized. Core biopsy samples were obtained without complication.  IMPRESSION: 1. Technically successful ultrasound guided core periumbilical nodule biopsy.   Electronically Signed   By: Lucrezia Europe M.D.   On: 07/17/2015 16:15   US  Paracentesis  07/17/2015   CLINICAL DATA:  Omental disease and peritoneal nodules.  EXAM: ULTRASOUND GUIDED PARACENTESIS  TECHNIQUE: The procedure, risks (including but not limited to bleeding, infection, organ damage ), benefits, and alternatives were explained to the patient. Questions regarding the procedure were encouraged and answered. The patient understands and consents to the procedure. Survey ultrasound of the abdomen was performed and an appropriate skin entry site in the right lower abdomen was selected. Skin site was marked, prepped with Betadine, and draped in usual sterile fashion, and infiltrated locally with 1% lidocaine. A 5 French multisidehole Yueh sheath needle was advanced into the peritoneal space until fluid could be aspirated. The sheath was advanced and the needle removed. 1.2 L of cloudy yellow ascites were aspirated. A sample sent to cytology for the requested studies.  COMPLICATIONS: COMPLICATIONS none  IMPRESSION: Technically successful ultrasound guided paracentesis, removing 1.2 L of ascites. Sample to cytology.   Electronically Signed   By: Lucrezia Europe M.D.   On: 07/17/2015 16:17   Ct Biopsy  07/24/2015   CLINICAL DATA:  39 year old male with a new diagnosis of lymphoma.  EXAM: CT BIOPSY  Date: 07/24/2015  PROCEDURE: 1. CT guided bone marrow aspiration and core biopsy Interventional Radiologist:  Criselda Peaches, MD  ANESTHESIA/SEDATION: Moderate (conscious) sedation was used. 2.5 mg Versed, 100 mcg Fentanyl were administered intravenously. The patient's vital signs were monitored continuously by radiology nursing throughout the procedure.  Sedation Time: 9 minutes  FLUOROSCOPY TIME:  None  TECHNIQUE: Informed consent was obtained from the patient following explanation of the procedure, risks, benefits and alternatives. The patient understands, agrees and consents for the procedure. All questions were addressed. A time out was performed.  The patient was positioned prone and  noncontrast localization CT was performed of the pelvis to demonstrate the iliac marrow spaces.  Maximal barrier sterile technique utilized including caps, mask, sterile gowns, sterile gloves, large sterile drape, hand hygiene, and betadine prep.  Under sterile conditions and local anesthesia, an 11 gauge coaxial bone biopsy needle was advanced into the right iliac marrow space. Needle position was confirmed with CT imaging. Initially, bone marrow aspiration was performed. Next, the 11 gauge outer cannula was utilized to obtain a right iliac bone marrow core biopsy. Needle was removed. Hemostasis was obtained with compression. The patient tolerated the procedure well. Samples were prepared with the cytotechnologist. No immediate complications.  IMPRESSION: CT guided right iliac bone marrow aspiration and core biopsy. The On-Control drill was used.  Signed,  Criselda Peaches, MD  [facility]  [facility]   Electronically Signed   By: Jacqulynn Cadet M.D.   On: 07/24/2015 12:32   Dg Chest Port 1 View  07/22/2015   CLINICAL DATA:  Pulmonary edema, hypoxia  EXAM: PORTABLE CHEST - 1 VIEW  COMPARISON:  07/21/2015  FINDINGS: Heart  size is normal. Right-sided PICC line in place with tip over the cavoatrial junction. Hazy perihilar rate greater than left basilar airspace opacities are slightly decreased. Trace pleural effusions.  IMPRESSION: Interval slight decrease in hazy perihilar airspace opacities compatible with improving edema.   Electronically Signed   By: Conchita Paris M.D.   On: 07/22/2015 08:10   Dg Chest Port 1 View  07/21/2015   CLINICAL DATA:  Acute respiratory failure with hypoxemia. Nonsmoker.  EXAM: PORTABLE CHEST - 1 VIEW  COMPARISON:  07/20/2015.  FINDINGS: Cardiomediastinal silhouette appears within normal limits for size. Diffuse pulmonary opacities persist but are slightly improved consistent with resolving edema. Decreased BILATERAL pleural  effusions. PICC line tip SVC. No pneumothorax.  IMPRESSION: Improving pulmonary edema.   Electronically Signed   By: Staci Righter M.D.   On: 07/21/2015 09:38   Dg Chest Port 1 View  07/20/2015   CLINICAL DATA:  Shortness of Breath  EXAM: PORTABLE CHEST - 1 VIEW  COMPARISON:  July 15, 2015  FINDINGS: There is alveolar edema throughout both lungs. There are small bilateral effusions. Heart is upper normal in size. There is mild pulmonary venous hypertension. No adenopathy.  IMPRESSION: Extensive edema. Question a degree of congestive heart failure versus noncardiogenic edema. Note that cardiac silhouette is unchanged compared to recent prior study. No adenopathy apparent.   Electronically Signed   By: Lowella Grip III M.D.   On: 07/20/2015 15:36   Dg Fluoro Guide Lumbar Puncture  07/24/2015   CLINICAL DATA:  Lymphoma  EXAM: DIAGNOSTIC LUMBAR PUNCTURE UNDER FLUOROSCOPIC GUIDANCE  FLUOROSCOPY TIME:  Radiation Exposure Index (as provided by the fluoroscopic device):  If the device does not provide the exposure index:  Fluoroscopy Time (in minutes and seconds):  13 seconds  Number of Acquired Images:  0  PROCEDURE: Informed consent was obtained from the patient prior to the procedure, including potential complications of headache, allergy, and pain. With the patient prone, the lower back was prepped with Betadine. 1% Lidocaine was used for local anesthesia. Lumbar puncture was performed at the L3-4 level using a 22 gauge needle with return of clear CSF with an opening pressure of 14 cm water. 12 ml of CSF were obtained for laboratory studies. The patient tolerated the procedure well and there were no apparent complications.  IMPRESSION: 1. Technically successful lumbar puncture. 12 cc of clear CSF were obtained for laboratory studies.   Electronically Signed   By: Kerby Moors M.D.   On: 07/24/2015 12:31    Labs:  CBC:  Recent Labs  07/24/15 0500 07/25/15 0550 07/26/15 0500 07/27/15 0437  WBC  8.3 9.3 13.4* 15.1*  HGB 12.8* 12.9* 12.9* 12.1*  HCT 38.1* 38.6* 37.3* 35.3*  PLT 216 235 252 248    COAGS:  Recent Labs  07/15/15 0440 07/20/15 0407 07/27/15 0444  INR 1.18 1.21 1.15  APTT 30 29 24     BMP:  Recent Labs  07/25/15 1700 07/26/15 0500 07/26/15 1715 07/27/15 0437  NA 134* 138 137 138  K 4.5 4.2 4.2 3.9  CL 102 104 105 107  CO2 25 25 25 24   GLUCOSE 174* 135* 157* 137*  BUN 20 26* 26* 25*  CALCIUM 7.8* 8.0* 8.0* 8.3*  CREATININE 0.89 0.84 0.95 0.87  GFRNONAA >60 >60 >60 >60  GFRAA >60 >60 >60 >60    LIVER FUNCTION TESTS:  Recent Labs  07/25/15 1700 07/26/15 0500 07/26/15 1715 07/27/15 0437  BILITOT 0.4 0.5 0.4 0.6  AST 90* 56*  51* 41  ALT 123* 99* 97* 82*  ALKPHOS 52 43 49 48  PROT 5.5* 5.4* 5.7* 5.1*  ALBUMIN 3.0* 2.7* 3.0* 2.8*    TUMOR MARKERS:  Recent Labs  07/16/15 1136  AFPTM 4.5    Assessment and Plan:  NHL/Burkitt lymphoma Scheduled for PAC placement Treatment as OP Risks and Benefits discussed with the patient including, but not limited to bleeding, infection, pneumothorax, or fibrin sheath development and need for additional procedures. All of the patient's questions were answered, patient is agreeable to proceed. Consent signed and in chart.   Thank you for this interesting consult.  I greatly enjoyed meeting Donald [name] and look forward to participating in their care.  A copy of this report was sent to the requesting provider on this date.  Signed: Jilliana Burkes A 07/27/2015, 10:38 AM   I spent a total of 20 Minutes    in face to face in clinical consultation, greater than 50% of which was counseling/coordinating care for [facility] placement

## 2015-07-27 NOTE — Progress Notes (Cosign Needed)
Manual calculation of BSA and dosing for Doxorubicin, Etoposide, and Vincristine completed. Secondary verification by Drue Dun, RN

## 2015-07-27 NOTE — Progress Notes (Signed)
Dr Irene Limbo called unit to see what "had happened". Told MD what Dr Kathlene Cote had said. Patient would prefer to get [facility] a cath instead of exchanging out  PICC line. MD to place order for [facility] a cath.

## 2015-07-27 NOTE — Progress Notes (Signed)
Sent labs for PT/PTT.

## 2015-07-27 NOTE — Progress Notes (Signed)
Paged Dr Irene Limbo at [phone] about PICC line issues of leakage. Told him that chemo had been stopped and that IV team had checked and there is no IV team member doing PICCs tonight. Only other option would be for MD to consult IR MD about doing a PICC this evening. Dr Irene Limbo told this RN to call the hospitalist on call so they could come assess the PICC and make the referral since they "are the primary MD of record".

## 2015-07-27 NOTE — Progress Notes (Signed)
Spoke to patient about not being able to get PICC replaced tonight. Restarted fluids and told patient call if any leakage. Patient would prefer to get [facility] a cath placed instead of replacing PICC.

## 2015-07-27 NOTE — Progress Notes (Signed)
Paged [facility] on call for the hospitalist. Told her the situation and need to get a IR referral to replace PICC. She to page IR MD on call.

## 2015-07-27 NOTE — Procedures (Signed)
Interventional Radiology Procedure Note  Procedure: Placement of a right IJ approach single lumen PowerPort.  Tip is positioned at the superior cavoatrial junction and catheter is ready for immediate use.  Complications: No immediate Recommendations:  - Ok to shower tomorrow - Routine line care   Glenn T. Yamagata, M.D Pager:  [phone]   

## 2015-07-27 NOTE — Progress Notes (Signed)
Paged [facility] via [facility] text about what Dr Kathlene Cote said.

## 2015-07-28 LAB — CBC WITH DIFFERENTIAL/PLATELET
Basophils Absolute: 0 10*3/uL (ref 0.0–0.1)
Basophils Relative: 0 %
EOS PCT: 0 %
Eosinophils Absolute: 0 10*3/uL (ref 0.0–0.7)
HEMATOCRIT: 34.8 % — AB (ref 39.0–52.0)
Hemoglobin: 11.8 g/dL — ABNORMAL LOW (ref 13.0–17.0)
LYMPHS ABS: 0.9 10*3/uL (ref 0.7–4.0)
LYMPHS PCT: 10 %
MCH: 29 pg (ref 26.0–34.0)
MCHC: 33.9 g/dL (ref 30.0–36.0)
MCV: 85.5 fL (ref 78.0–100.0)
MONO ABS: 0.6 10*3/uL (ref 0.1–1.0)
Monocytes Relative: 6 %
NEUTROS ABS: 7.4 10*3/uL (ref 1.7–7.7)
Neutrophils Relative %: 84 %
PLATELETS: 253 10*3/uL (ref 150–400)
RBC: 4.07 MIL/uL — AB (ref 4.22–5.81)
RDW: 13.1 % (ref 11.5–15.5)
WBC: 8.8 10*3/uL (ref 4.0–10.5)

## 2015-07-28 LAB — COMPREHENSIVE METABOLIC PANEL
ALBUMIN: 2.6 g/dL — AB (ref 3.5–5.0)
ALBUMIN: 3 g/dL — AB (ref 3.5–5.0)
ALK PHOS: 43 U/L (ref 38–126)
ALK PHOS: 49 U/L (ref 38–126)
ALT: 125 U/L — AB (ref 17–63)
ALT: 134 U/L — ABNORMAL HIGH (ref 17–63)
ANION GAP: 6 (ref 5–15)
ANION GAP: 7 (ref 5–15)
AST: 63 U/L — ABNORMAL HIGH (ref 15–41)
AST: 66 U/L — ABNORMAL HIGH (ref 15–41)
BILIRUBIN TOTAL: 0.7 mg/dL (ref 0.3–1.2)
BILIRUBIN TOTAL: 0.8 mg/dL (ref 0.3–1.2)
BUN: 23 mg/dL — ABNORMAL HIGH (ref 6–20)
BUN: 22 mg/dL — ABNORMAL HIGH (ref 6–20)
CALCIUM: 8.3 mg/dL — AB (ref 8.9–10.3)
CALCIUM: 8.3 mg/dL — AB (ref 8.9–10.3)
CO2: 26 mmol/L (ref 22–32)
CO2: 25 mmol/L (ref 22–32)
CREATININE: 0.78 mg/dL (ref 0.61–1.24)
Chloride: 106 mmol/L (ref 101–111)
Chloride: 104 mmol/L (ref 101–111)
Creatinine, Ser: 0.83 mg/dL (ref 0.61–1.24)
GFR calc Af Amer: >60 mL/min (ref >60)
GFR calc non Af Amer: >60 mL/min (ref >60)
GLUCOSE: 115 mg/dL — AB (ref 65–99)
GLUCOSE: 123 mg/dL — AB (ref 65–99)
POTASSIUM: 3.9 mmol/L (ref 3.5–5.1)
Potassium: 4 mmol/L (ref 3.5–5.1)
Sodium: 138 mmol/L (ref 135–145)
Sodium: 136 mmol/L (ref 135–145)
TOTAL PROTEIN: 5 g/dL — AB (ref 6.5–8.1)
TOTAL PROTEIN: 5.5 g/dL — AB (ref 6.5–8.1)

## 2015-07-28 LAB — PHOSPHORUS: Phosphorus: 4.2 mg/dL (ref 2.5–4.6)

## 2015-07-28 LAB — URIC ACID
URIC ACID, SERUM: 3.6 mg/dL — AB (ref 4.4–7.6)
Uric Acid, Serum: 4.1 mg/dL — ABNORMAL LOW (ref 4.4–7.6)

## 2015-07-28 LAB — THYROID STIMULATING IMMUNOGLOBULIN: THYROID STIMULATING IMMUNOGLOB: 28 % (ref 0–139)

## 2015-07-28 LAB — THYROGLOBULIN LEVEL: THYROGLOBULIN: 43 ng/mL — AB

## 2015-07-28 LAB — MAGNESIUM: Magnesium: 1.9 mg/dL (ref 1.7–2.4)

## 2015-07-28 MED ORDER — VINCRISTINE SULFATE CHEMO INJECTION 1 MG/ML
Freq: Once | INTRAVENOUS | Status: AC
  Administered 2015-07-28: 17:00:00 via INTRAVENOUS
  Filled 2015-07-28: qty 14

## 2015-07-28 MED ORDER — SENNOSIDES-DOCUSATE SODIUM 8.6-50 MG PO TABS
2.0000 | ORAL_TABLET | Freq: Two times a day (BID) | ORAL | Status: DC
  Administered 2015-07-29 (×2): 2 via ORAL
  Filled 2015-07-28 (×3): qty 2

## 2015-07-28 MED ORDER — ONDANSETRON HCL 40 MG/20ML IJ SOLN
8.0000 mg | Freq: Once | INTRAMUSCULAR | Status: AC
  Administered 2015-07-28: 8 mg via INTRAVENOUS
  Filled 2015-07-28: qty 4

## 2015-07-28 MED ORDER — LACTULOSE 10 GM/15ML PO SOLN
20.0000 g | Freq: Two times a day (BID) | ORAL | Status: DC | PRN
  Administered 2015-07-29: 20 g via ORAL
  Filled 2015-07-28 (×2): qty 30

## 2015-07-28 NOTE — Progress Notes (Signed)
Chemotherapy calculation for doxorubicin, etoposide, and vincristine verified with Reyne Dumas, RN

## 2015-07-28 NOTE — Progress Notes (Signed)
[facility] PROGRESS NOTE  Donald [name] DXI:[id number] DOB: 1976/01/23 DOA: 07/14/2015 PCP: Drema Pry, DO  brief narrative 39 year old male with no medical history presented with 2 weeks of back pain while riding his bike. He had a chest x-ray done as outpatient which suggested a possible infection and was given antibiotic. However patient returned to the ED as he had right chest pain and was short of breath. In the ED was found to have a large right-sided pleural effusion and was admitted for further workup. Patient transferred to [facility] long for excisional core biopsy. Patient was started on Chemotherapy.   Assessment/Plan: Burkitt B cell Lymphoma:  Pleural effusion, right/abdominal mass suggestive of B cell lymphoma: -CT scan of the chest with contrast: -  significant effusion with compression of the right lung. -CT scan of the abdomen :- peritoneal carcinomatosis with no lymphadenopathy -Right  Thoracocentesis suggests exudate. -oncology following. Started on decadron on 9/10 after laparoscopic excisional biopsy on 9/9. PET scan  9/12. -PICC line placed on 9/9 -[place] core biopsy of omental mass and diagnostic/therapeutic paracentesis,. tissue flow cytometry suggests proliferative B cell.  -plan to start inpatient treatment. -SP LP and BM biopsy 9-13. No malignant cell CSF/  -Started on chemo. Monitor for adverse reaction.  -phosphorus has decreased to 4.2. Continue with IV bicarb gtt.  Hopefully home Monday or Tuesday, will follow oncologist recommendation.   Postop Acute Hypoxemic Respiratory Failure on 9/9 Patient extubated and placed on nonrebreather. Given 10 mg IV Lasix. Lactic acid mildly elevated. Chest x-ray shows extensive pulmonary edema. Discontinued IV fluids. Received 2 doses of IV lasix ( total 40 mg on 9/10). Currently maintaining sats on 2.5 L via nasal cannula.  Continue incentive spirometry. Repeat chest x-ray showed improving pulmonary edema. Received one time dose  of lasix 9-11--9-12.  Weight at 90 Kg. Hold on lasix doses. Follow clinically.  Monitor on chemo. If respiratory distress might need lasix, chest x ray.   Bradycardia, asymptomatic.  Denies lightheaded, ro dyspnea.  EKG sinus bradycardia. HR has increased.   Acute kidney injury -possibly due to tumor lysis syndrome and IV contrast -Renal function back to normal.  Tumor lysis syndrome: -on  allopurinol  -Renal function improving and uric acid normal.  -phosphorus has decreased. Continue with iv Fluids.   Hypokalemia Resolved.   GERD Added PPI twice a day and Maalox. Symptoms improved.  Elevated TSH and free T4 No clear etiology.? Thyroiditis versus secondary hyper thyroidism. Follow thyroglobulin antibody and stimulating immunoglobulin.  Screening process: -non reactive HIV -CMV IGM and antibody neg -EBV results (DNA by PCR and VCA antibody) negative.  -ESR 6 -CRP 4.3 -hepatitis panel neg -AFP 4.5 -HCG tumor marker < 1     DVT prophylaxis: sq heparin  Diet: Regular   Consultants:  IR Oncology ( Dr Irene Limbo) [facility]  Procedures:  Thoracocentesis  Paracentesis  laproscopy abdominal biopsy  Antibiotics:  None  CODE STATUS: Full code Family communication: None at bedside today.  Disposition: start chemotherapy  HPI/Subjective: Had small BM. Feeling weak and tired today.  No significant abdominal pain.    Objective: Filed Vitals:   07/28/15 1436  BP: 117/64  Pulse: 53  Temp: 98.3 F (36.8 C)  Resp: 18    Intake/Output Summary (Last 24 hours) at 07/28/15 1556 Last data filed at 07/28/15 1340  Gross per 24 hour  Intake 2774.83 ml  Output   1400 ml  Net 1374.83 ml   Filed Weights   07/26/15 0700 07/27/15 0505 07/28/15 0539  Weight: 88.769 kg (195 lb 11.2 oz) 88.089 kg (194 lb 3.2 oz) 88.905 kg (196 lb)    Exam:   General:  Middle aged male in no acute distress  Chest: crackles base, more pronounced on the right.   CVS:  Normal S1 and S2, no murmurs  GI: Soft, abdominal distention, nontender, bowel sounds present  Musculoskeletal: Warm, no edema  CNS: Alert and oriented  Data Reviewed: Basic Metabolic Panel:  Recent Labs Lab 07/24/15 0500 07/25/15 0550  07/26/15 0500 07/26/15 1715 07/27/15 0437 07/27/15 1735 07/28/15 0545  NA 138  --   < > 138 137 138 137 138  K 4.1  --   < > 4.2 4.2 3.9 3.8 4.0  CL 103  --   < > 104 105 107 106 106  CO2 27  --   < > 25 25 24 24 26   GLUCOSE 96  --   < > 135* 157* 137* 145* 115*  BUN 21*  --   < > 26* 26* 25* 25* 23*  CREATININE 0.77  --   < > 0.84 0.95 0.87 0.92 0.78  CALCIUM 8.1*  --   < > 8.0* 8.0* 8.3* 8.1* 8.3*  MG  --  1.8  --  2.3  --  2.2  --  1.9  PHOS 4.7* 3.6  --  6.8*  --  4.7*  --  4.2  < > = values in this interval not displayed. Liver Function Tests:  Recent Labs Lab 07/26/15 0500 07/26/15 1715 07/27/15 0437 07/27/15 1735 07/28/15 0545  AST 56* 51* 41 119* 63*  ALT 99* 97* 82* 153* 125*  ALKPHOS 43 49 48 52 43  BILITOT 0.5 0.4 0.6 0.5 0.7  PROT 5.4* 5.7* 5.1* 5.4* 5.0*  ALBUMIN 2.7* 3.0* 2.8* 2.8* 2.6*   No results for input(s): LIPASE, AMYLASE in the last 168 hours. No results for input(s): AMMONIA in the last 168 hours. CBC:  Recent Labs Lab 07/24/15 0500 07/25/15 0550 07/26/15 0500 07/27/15 0437 07/28/15 0545  WBC 8.3 9.3 13.4* 15.1* 8.8  NEUTROABS 5.5 5.7 11.1* 12.7* 7.4  HGB 12.8* 12.9* 12.9* 12.1* 11.8*  HCT 38.1* 38.6* 37.3* 35.3* 34.8*  MCV 87.0 86.5 84.4 84.7 85.5  PLT 216 235 252 248 253   Cardiac Enzymes: No results for input(s): CKTOTAL, CKMB, CKMBINDEX, TROPONINI in the last 168 hours. BNP (last 3 results)  Recent Labs  07/20/15 1646  BNP 13.9    ProBNP (last 3 results) No results for input(s): PROBNP in the last 8760 hours.  CBG:  Recent Labs Lab 07/23/15 0908  GLUCAP 88    Recent Results (from the past 240 hour(s))  Surgical pcr screen     Status: None   Collection Time: 07/19/15  10:12 PM  Result Value Ref Range Status   MRSA, PCR NEGATIVE NEGATIVE Final   Staphylococcus aureus NEGATIVE NEGATIVE Final    Comment:        The Xpert SA Assay (FDA approved for NASAL specimens in patients over 41 years of age), is one component of a comprehensive surveillance program.  Test performance has been validated by [facility] for patients greater than or equal to 109 year old. It is not intended to diagnose infection nor to guide or monitor treatment.   CSF culture     Status: None   Collection Time: 07/24/15 11:46 AM  Result Value Ref Range Status   Specimen Description CSF  Final   Special Requests NONE  Final  Gram Stain   Final    WBC PRESENT, PREDOMINANTLY MONONUCLEAR NO ORGANISMS SEEN CYTOSPIN Gram Stain Report Called to,Read Back By and Verified With: BERGER,J. RN @1449  ON 9.13.16 BY MCCOY,N.    Culture   Final    NO GROWTH 3 DAYS Performed at [facility]    Report Status 07/27/2015 FINAL  Final     Studies: Ir Fluoro Guide Cv Line Right  07/27/2015   CLINICAL DATA:  New diagnosis of Burkitt's lymphoma. The patient requires a porta cath for chemotherapy.  EXAM: IMPLANTED PORT A CATH PLACEMENT WITH ULTRASOUND AND FLUOROSCOPIC GUIDANCE  ANESTHESIA/SEDATION: 6.0 Mg IV Versed; 150 mcg IV Fentanyl  Total Moderate Sedation Time:  30 minutes  Additional Medications: 2 g IV Ancef. As antibiotic prophylaxis, Ancef was ordered pre-procedure and administered intravenously within one hour of incision.  FLUOROSCOPY TIME:  24 seconds.  PROCEDURE: The procedure, risks, benefits, and alternatives were explained to the patient. Questions regarding the procedure were encouraged and answered. The patient understands and consents to the procedure. A time-out was performed prior to the procedure.  Ultrasound was used to confirm patency of the right internal jugular vein. The right neck and chest were prepped with chlorhexidine in a sterile fashion, and a sterile drape was  applied covering the operative field. Maximum barrier sterile technique with sterile gowns and gloves were used for the procedure. Local anesthesia was provided with 1% lidocaine.  After creating a small venotomy incision, a 21 gauge needle was advanced into the right internal jugular vein under direct, real-time ultrasound guidance. Ultrasound image documentation was performed. After securing guidewire access, an 8 Fr dilator was placed. A J-wire was kinked to measure appropriate catheter length.  A subcutaneous port pocket was then created along the upper chest wall utilizing sharp and blunt dissection. Portable cautery was utilized. The pocket was irrigated with sterile saline.  A single lumen power injectable port was chosen for placement. The 8 Fr catheter was tunneled from the port pocket site to the venotomy incision. The port was placed in the pocket. External catheter was trimmed to appropriate length based on guidewire measurement.  At the venotomy, an 8 Fr peel-away sheath was placed over a guidewire. The catheter was then placed through the sheath and the sheath removed. Final catheter positioning was confirmed and documented with a fluoroscopic spot image. The port was accessed with a needle and aspirated and flushed with heparinized saline. The needle was left in place for immediate use.  The venotomy and port pocket incisions were closed with subcutaneous 3-0 Monocryl and subcuticular 4-0 Vicryl. Dermabond was applied to both incisions.  COMPLICATIONS: None  FINDINGS: After catheter placement, the tip lies at the cavoatrial junction. The catheter aspirates normally and is ready for immediate use.  IMPRESSION: Placement of single lumen port a cath via right internal jugular vein. The catheter tip lies at the cavoatrial junction. A power injectable port a cath was placed and is ready for immediate use.   Electronically Signed   By: Aletta Edouard M.D.   On: 07/27/2015 15:28   Ir US Guide Vasc Access  Right  07/27/2015   CLINICAL DATA:  New diagnosis of Burkitt's lymphoma. The patient requires a porta cath for chemotherapy.  EXAM: IMPLANTED PORT A CATH PLACEMENT WITH ULTRASOUND AND FLUOROSCOPIC GUIDANCE  ANESTHESIA/SEDATION: 6.0 Mg IV Versed; 150 mcg IV Fentanyl  Total Moderate Sedation Time:  30 minutes  Additional Medications: 2 g IV Ancef. As antibiotic prophylaxis, Ancef was ordered pre-procedure  and administered intravenously within one hour of incision.  FLUOROSCOPY TIME:  24 seconds.  PROCEDURE: The procedure, risks, benefits, and alternatives were explained to the patient. Questions regarding the procedure were encouraged and answered. The patient understands and consents to the procedure. A time-out was performed prior to the procedure.  Ultrasound was used to confirm patency of the right internal jugular vein. The right neck and chest were prepped with chlorhexidine in a sterile fashion, and a sterile drape was applied covering the operative field. Maximum barrier sterile technique with sterile gowns and gloves were used for the procedure. Local anesthesia was provided with 1% lidocaine.  After creating a small venotomy incision, a 21 gauge needle was advanced into the right internal jugular vein under direct, real-time ultrasound guidance. Ultrasound image documentation was performed. After securing guidewire access, an 8 Fr dilator was placed. A J-wire was kinked to measure appropriate catheter length.  A subcutaneous port pocket was then created along the upper chest wall utilizing sharp and blunt dissection. Portable cautery was utilized. The pocket was irrigated with sterile saline.  A single lumen power injectable port was chosen for placement. The 8 Fr catheter was tunneled from the port pocket site to the venotomy incision. The port was placed in the pocket. External catheter was trimmed to appropriate length based on guidewire measurement.  At the venotomy, an 8 Fr peel-away sheath was placed  over a guidewire. The catheter was then placed through the sheath and the sheath removed. Final catheter positioning was confirmed and documented with a fluoroscopic spot image. The port was accessed with a needle and aspirated and flushed with heparinized saline. The needle was left in place for immediate use.  The venotomy and port pocket incisions were closed with subcutaneous 3-0 Monocryl and subcuticular 4-0 Vicryl. Dermabond was applied to both incisions.  COMPLICATIONS: None  FINDINGS: After catheter placement, the tip lies at the cavoatrial junction. The catheter aspirates normally and is ready for immediate use.  IMPRESSION: Placement of single lumen port a cath via right internal jugular vein. The catheter tip lies at the cavoatrial junction. A power injectable port a cath was placed and is ready for immediate use.   Electronically Signed   By: Aletta Edouard M.D.   On: 07/27/2015 15:28    Scheduled Meds: . allopurinol  150 mg Oral BID  . DOXOrubicin/vinCRIStine/etoposide CHEMO IV infusion for Inpatient CI   Intravenous Once  . enoxaparin (LOVENOX) injection  40 mg Subcutaneous Q24H  . ondansetron (ZOFRAN) IV  8 mg Intravenous Once  . pantoprazole  40 mg Oral BID AC  . polyethylene glycol  17 g Oral BID  . predniSONE  120 mg Oral QAC breakfast  . senna-docusate  2 tablet Oral QHS  . sodium chloride  10-40 mL Intracatheter Q12H   Continuous Infusions: . sodium chloride 10 mL/hr at 07/15/15 1023  . sodium chloride 10 mL/hr at 07/25/15 1056  .  sodium bicarbonate  infusion 1000 mL 75 mL/hr at 07/28/15 0341     Time spent: 25 minutes    Jaiveer Panas, [facility] Pager [phone] If 7PM-7AM, please contact night-coverage at [email or web address], password [facility] 07/28/2015, 3:56 PM  LOS: 14 days

## 2015-07-28 NOTE — Progress Notes (Signed)
IV beeping heard and went to patient's room. He had just hit the call bell when RN entered. Patient was closing the channel on the chemo line that was beeping. Told patient not to open doors on the pump and to call the RN of the desk if the pump was beeping.

## 2015-07-28 NOTE — Progress Notes (Signed)
IV pump beeping. Entered room and patient's visitor had hit the pause button on the pump. Told her to call if pump was beeping and staff would come fix it. She does not need to touch buttons on the pump.

## 2015-07-28 NOTE — Progress Notes (Signed)
[name]   HEMATOLOGY/ONCOLOGY INPATIENT PROGRESS NOTE  Date of Service: 07/28/2015  Inpatient Attending: .Elmarie Shiley, MD   SUBJECTIVE  Patient seen this afternoon. He was in a more contemplative mood. Feeling a little fatigued. No nausea/vomiting. No bowel movement in 2 days. Increased senna-s. Offered mag citrate.- wants to hold off for now. No other acute new symptoms.  OBJECTIVE:  PHYSICAL EXAMINATION: . Filed Vitals:   07/27/15 2209 07/28/15 0539 07/28/15 1436 07/28/15 2015  BP: 118/69  117/64 119/71  Pulse: 55  53 50  Temp: 98.5 F (36.9 C)  98.3 F (36.8 C) 98.5 F (36.9 C)  TempSrc: Oral  Oral Oral  Resp: _0 Height:      Weight:  196 lb (88.905 kg)    SpO2: 99%  97% 96%   Filed Weights   07/26/15 0700 07/27/15 0505 07/28/15 0539  Weight: 195 lb 11.2 oz (88.769 kg) 194 lb 3.2 oz (88.089 kg) 196 lb (88.905 kg)   .Body mass index is 27.35 kg/(m^2).  GENERAL: SKIN:no acute rashes. EYES: normal, conjunctiva are pink and non-injected, sclera clear OROPHARYNX:no exudate, no erythema and lips, buccal mucosa, and tongue normal  NECK: supple, no JVD, thyroid normal size, non-tender, without nodularity LYMPH:  no palpable lymphadenopathy in the cervical, axillary or inguinal LUNGS: CTA bilaterally HEART: regular rate & rhythm,  no murmurs and trace lower extremity edema ABDOMEN: abdomen mildly distended (improved), BS normoactive, laparoscopic incisions clean and healing well. PSYCH: alert & oriented x 3 with fluent speech NEURO: no focal motor/sensory deficits  MEDICAL HISTORY:  Past Medical History  Diagnosis Date  . EBV infection 2013    Patient notes that he had an EBV infection in 2013 that left him with chronic fatigue. She also notes that he had significant lymphadenopathy and thyroiditis at the time. He has been managing his symptoms with an alternative medicine practitioner in [place] and with other alternative medicines.  . Marijuana use,  episodic     SURGICAL HISTORY: Past Surgical History  Procedure Laterality Date  . Tonsillectomy    . Appendectomy    . Laparoscopy N/A 07/20/2015    Procedure: LAPAROSCOPY DIAGNOSTIC, MULTIPLE BIOPSIES, DRAINAGE OF ASCITES;  Surgeon: Fanny Skates, MD;  Location: [facility];  Service: General;  Laterality: N/A;    SOCIAL HISTORY: Social History   Social History  . Marital Status: Married    Spouse Name: N/A  . Number of Children: N/A  . Years of Education: N/A   Occupational History  . Not on file.   Social History Main Topics  . Smoking status: Never Smoker   . Smokeless tobacco: Not on file  . Alcohol Use: No  . Drug Use: 1.00 per week    Special: Marijuana  . Sexual Activity: Not on file   Other Topics Concern  . 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   He has a fiance and has been in a steady relationship.      He has tried to maintain a very healthy lifestyle and cycles about 100 miles a week. He also uses a fair number of over-the-counter alternative medicines to stay healthy.    FAMILY HISTORY: Family History  Problem Relation Age of Onset  . Heart failure Father     ALLERGIES:  is allergic to fluoride preparations and sulfa antibiotics.  MEDICATIONS:  Scheduled Meds: . allopurinol  150  mg Oral BID  . DOXOrubicin/vinCRIStine/etoposide CHEMO IV infusion for Inpatient CI   Intravenous Once  . enoxaparin (LOVENOX) injection  40 mg Subcutaneous Q24H  . pantoprazole  40 mg Oral BID AC  . polyethylene glycol  17 g Oral BID  . predniSONE  120 mg Oral QAC breakfast  . [START ON 07/29/2015] senna-docusate  2 tablet Oral BID  . sodium chloride  10-40 mL Intracatheter Q12H   Continuous Infusions: . sodium chloride 10 mL/hr at 07/15/15 1023  . sodium chloride 10 mL/hr at 07/25/15 1056  .  sodium bicarbonate  infusion 1000 mL 75 mL/hr at 07/28/15 1937   PRN  Meds:.alteplase, Cold Pack, fludeoxyglucose F - 18, gi cocktail, heparin lock flush, heparin lock flush, Hot Pack, HYDROcodone-acetaminophen, lactulose, LORazepam, morphine injection, ondansetron (ZOFRAN) IV, rasburicase (ELITEK) IV infusion, sodium chloride, sodium chloride, sodium chloride  REVIEW OF SYSTEMS:    10 Point review of Systems was done is negative except as noted above.   LABORATORY DATA:  I have reviewed the data as listed  . CBC Latest Ref Rng 07/28/2015 07/27/2015 07/26/2015  WBC 4.0 - 10.5 K/uL 8.8 15.1(H) 13.4(H)  Hemoglobin 13.0 - 17.0 g/dL 11.8(L) 12.1(L) 12.9(L)  Hematocrit 39.0 - 52.0 % 34.8(L) 35.3(L) 37.3(L)  Platelets 150 - 400 K/uL 253 248 252   . CBC    Component Value Date/Time   WBC 8.8 07/28/2015 0545   RBC 4.07* 07/28/2015 0545   RBC 5.39 07/18/2015 0920   HGB 11.8* 07/28/2015 0545   HCT 34.8* 07/28/2015 0545   PLT 253 07/28/2015 0545   MCV 85.5 07/28/2015 0545   MCH 29.0 07/28/2015 0545   MCHC 33.9 07/28/2015 0545   RDW 13.1 07/28/2015 0545   LYMPHSABS 0.9 07/28/2015 0545   MONOABS 0.6 07/28/2015 0545   EOSABS 0.0 07/28/2015 0545   BASOSABS 0.0 07/28/2015 0545      . CMP Latest Ref Rng 07/28/2015 07/28/2015 07/27/2015  Glucose 65 - 99 mg/dL 123(H) 115(H) 145(H)  BUN 6 - 20 mg/dL 22(H) 23(H) 25(H)  Creatinine 0.61 - 1.24 mg/dL 0.83 0.78 0.92  Sodium 135 - 145 mmol/L 136 138 137  Potassium 3.5 - 5.1 mmol/L 3.9 4.0 3.8  Chloride 101 - 111 mmol/L 104 106 106  CO2 22 - 32 mmol/L _0 Calcium 8.9 - 10.3 mg/dL 8.3(L) 8.3(L) 8.1(L)  Total Protein 6.5 - 8.1 g/dL 5.5(L) 5.0(L) 5.4(L)  Total Bilirubin 0.3 - 1.2 mg/dL 0.8 0.7 0.5  Alkaline Phos 38 - 126 U/L 49 43 52  AST 15 - 41 U/L 66(H) 63(H) 119(H)  ALT 17 - 63 U/L 134(H) 125(H) 153(H)   Component     Latest Ref Rng 07/24/2015  Tube #      1  Color, CSF     COLORLESS COLORLESS  Appearance, CSF     CLEAR CLEAR (A)  Supernatant      NOT INDICATED  RBC Count, CSF     0 /cu mm 2 (H)    WBC, CSF     0 - 5 /cu mm 1  Other Cells, CSF      TOO FEW TO COUNT, SMEAR AVAILABLE FOR REVIEW  Specimen Description      CSF  Special Requests      NONE  Gram Stain      WBC PRESENT, PREDOMINANTLY MONONUCLEAR . . .  Culture      PENDING  Report Status      PENDING  Glucose, CSF  40 - 70 mg/dL 59  Total  Protein, CSF     15 - 45 mg/dL 15     RADIOGRAPHIC STUDIES: I have personally reviewed the radiological images as listed and agreed with the findings in the report. Dg Chest 2 View  07/15/2015   CLINICAL DATA:  Followup right pleural effusion, status post right thoracentesis.  EXAM: CHEST  2 VIEW  COMPARISON:  Yesterday.  FINDINGS: Stable right upper companion rib shadows. No pneumothorax seen. Significantly decreased right pleural fluid. The interstitial markings remain mildly prominent. Normal sized heart. Interval mild left lower lobe linear density. Unremarkable bones.  IMPRESSION: 1. No pneumothorax following right thoracentesis. 2. Significantly less right pleural fluid. 3. Interval mild left lower lobe atelectasis. 4. Stable mild chronic interstitial lung disease.   Electronically Signed   By: Claudie Revering M.D.   On: 07/15/2015 17:21   Dg Chest 2 View  07/14/2015   CLINICAL DATA:  Right-sided chest pain for 2-3 days. Shortness of breath.  EXAM: CHEST  2 VIEW  COMPARISON:  07/12/2015  FINDINGS: Heart size is probably normal. Suspect lung opacity at the right base in addition to the elevated hemidiaphragm. Opacity at the right lung base may be related to subpulmonic effusion. Overall the appearance is stable since previous exam. There is a trace left pleural effusion. No pulmonary edema.  IMPRESSION: Persistent significant opacity at the right lung base. Consider right lateral decubitus view to determine component of layering pleural effusion.   Electronically Signed   By: Nolon Nations M.D.   On: 07/14/2015 16:56   Dg Chest 2 View  07/12/2015   CLINICAL DATA:  Anterior chest  pain for 1 week with cough  EXAM: CHEST  2 VIEW  COMPARISON:  None.  FINDINGS: There is mild elevation the right hemidiaphragm. There is evidence suggesting consolidation in the medial right base. Lungs elsewhere clear. Heart size and pulmonary vascularity are normal. No adenopathy. No bone lesions.  IMPRESSION: Evidence of a degree of consolidation in the medial right base. There is elevation of the right hemidiaphragm. Lungs elsewhere clear. Cardiac silhouette within normal limits.  Followup PA and lateral chest radiographs recommended in 3-4 weeks following trial of antibiotic therapy to ensure resolution and exclude underlying malignancy.   Electronically Signed   By: Lowella Grip III M.D.   On: 07/12/2015 15:52   Ct Angio Chest Pe W/cm &/or Wo Cm  07/14/2015   CLINICAL DATA:  Right chest pain  EXAM: CT ANGIOGRAPHY CHEST WITH CONTRAST  TECHNIQUE: Multidetector CT imaging of the chest was performed using the standard protocol during bolus administration of intravenous contrast. Multiplanar CT image reconstructions and MIPs were obtained to evaluate the vascular anatomy.  CONTRAST:  132m OMNIPAQUE IOHEXOL 350 MG/ML SOLN  COMPARISON:  Chest x-ray 07/14/2015  FINDINGS: Negative for pulmonary embolism. Negative for aortic dissection or aneurysm  Moderately large right pleural effusion with compressive atelectasis of the right lower lobe. Atelectasis also present in the right middle lobe. Partial collapse of the right upper lobe medially adjacent to the mediastinum. This extends into the right upper lobe laterally. Small left pleural effusion. No significant infiltrate on the left.  No adenopathy is present in the mediastinum.  No acute bony abnormality  In the upper abdomen, there is mild to moderate ascites. This could be due to liver disease. There is fluid around the liver and spleen. The spleen is not enlarged. Limited imaging of the pancreas and kidneys are grossly normal. There is soft tissue  infiltration in the omentum. Carcinoma not excluded.  Review of the MIP images confirms the above findings.  IMPRESSION: Negative for pulmonary embolism  Moderate right pleural effusion with compressive atelectasis in the right lower lobe. Additional atelectasis in the right middle lobe and right upper lobe. No definite mass or pneumonia however infection and tumor are in the differential. There is a small left effusion  Concerning findings in the abdomen. There is ascites around the liver and spleen. There is soft tissue density involving the omentum which can be seen with carcinoma. Further imaging of the abdomen with CT abdomen pelvis with contrast is suggested.   Electronically Signed   By: Franchot Gallo M.D.   On: 07/14/2015 18:28   Ct Abdomen Pelvis W Contrast  07/15/2015   CLINICAL DATA:  39 year old male with ascites and potential omental disease noted on prior chest CT. Follow-up abdominal CT scan.  EXAM: CT ABDOMEN AND PELVIS WITH CONTRAST  TECHNIQUE: Multidetector CT imaging of the abdomen and pelvis was performed using the standard protocol following bolus administration of intravenous contrast.  CONTRAST:  124m OMNIPAQUE IOHEXOL 300 MG/ML  SOLN  COMPARISON:  CT of the chest 07/14/2015. CT of the pelvis 03/03/2008.  FINDINGS: Lower chest: Small bilateral pleural effusions (left greater than right). Subsegmental atelectasis in the posterior aspect of the right lower lobe.  Hepatobiliary: No cystic or solid hepatic lesion identified. No intra or extrahepatic biliary ductal dilatation. High attenuation material filling the gallbladder, presumably vicarious excretion of contrast material from the recent chest CT.  Pancreas: No pancreatic mass. No pancreatic ductal dilatation. No pancreatic or peripancreatic fluid or inflammatory changes.  Spleen: Unremarkable.  Adrenals/Urinary Tract: Bilateral adrenal glands and bilateral kidneys are unremarkable in appearance. No hydroureteronephrosis. Urinary bladder  is unremarkable in appearance.  Stomach/Bowel: In the mid small bowel (like mid to distal jejunum) there is a profoundly asymmetrically thickened loop of bowel best demonstrated on image 56 of series 2, where the wall measures up to 22 mm in thickness medially. Adjacent to this there are soft tissue masses extending into the root of the small bowel mesentery, largest of which measures 2.7 x 3.3 cm (image 47 of series 2), likely malignant lymph nodes. The appendix is not confidently identified. No pathologic dilatation of small bowel or colon. Stomach is grossly normal in appearance.  Vascular/Lymphatic: No significant atherosclerotic disease, aneurysm or dissection identified in the abdominal or pelvic vasculature. Circumaortic left renal vein (normal anatomical variant) incidentally noted. Probable mesenteric lymphadenopathy, as discussed above.  Reproductive: Prostate gland and seminal vesicles are unremarkable in appearance. Inguinal canals and visualized portions of the scrotum were grossly unremarkable in appearance. No lymphadenopathy noted along the gonadal drainage system.  Other: Large amount of abnormal enhancing soft tissue throughout the peritoneal cavity, most notable in the omentum, particularly in the right upper quadrant where there is bulky omental caking. Small volume of presumably malignant ascites. No pneumoperitoneum. Some abnormal soft tissue is extending through the anterior abdominal wall into the periumbilical region (image 53 of series 2).  Musculoskeletal: There are no aggressive appearing lytic or blastic lesions noted in the visualized portions of the skeleton.  IMPRESSION: 1. Widespread intraperitoneal malignancy, as demonstrated by extensive peritoneal nodularity and enhancement, widespread omental caking, likely malignant ascites and extensive mesenteric lymphadenopathy. The exact source of malignancy is uncertain, however, given the focally thickened loop of small bowel (likely  jejunal) in the left side of the central abdomen (image 56 of series 2), a primary small bowel malignancy  is suspected. 2. Small bilateral pleural effusions (left greater than right). Right-sided pleural effusion significantly decreased following thoracentesis. Resolving subsegmental atelectasis in the right lower lobe. 3. Additional incidental findings, as above. These results were called by telephone at the time of interpretation on 07/15/2015 at 5:33 pm to Dr. Olevia Bowens, who verbally acknowledged these results.   Electronically Signed   By: Vinnie Langton M.D.   On: 07/15/2015 17:34   Nm Pet Image Initial (pi) Skull Base To Thigh  07/23/2015   CLINICAL DATA:  Initial treatment strategy for high-grade non-Hodgkin's lymphoma. B-cell lymphoma.  EXAM: NUCLEAR MEDICINE PET SKULL BASE TO THIGH  TECHNIQUE: 10.0 mCi F-18 FDG was injected intravenously. Full-ring PET imaging was performed from the skull base to thigh after the radiotracer. CT data was obtained and used for attenuation correction and anatomic localization.  FASTING BLOOD GLUCOSE:  Value: 88 mg/dl  COMPARISON:  CT abdomen 07/15/2015, CT thorax 07/14/2015  FINDINGS: NECK  Small hypermetabolic lymph nodes the just deep to the RIGHT clavicle measure 9 mm short axis on image 54, series 4 with intense metabolic activity (SUV max 6.2).  CHEST  Small hypermetabolic internal mammary lymph nodes on the RIGHT (image 66 with SUV max equal 5.). Small hypermetabolic nodule posterior to the sternum in the anterior mediastinum on image 69 of fused data set.  Within the precordial space anterior to the liver there is a 4.5 cm hypermetabolic mass. Smaller hypermetabolic nodule anterior to the RIGHT ventricle.  ABDOMEN/PELVIS  There is a thin rim of intense metabolic activity associated entirety of the peritoneal surface consists with peritoneal metastasis / carcinomatosis. There is more focal activity within the LEFT abdomen associated with the small bowel wall thickening.  This segmental thickening measures 17 mm in single wall thickness with intense metabolic activity (SUV max 14.6). Central mesenteric mass measures 3 cm with SUV max 13.2.  There are no hypermetabolic inguinal or iliac lymph nodes.  SKELETON  No focal hypermetabolic activity to suggest skeletal metastasis.  IMPRESSION: 1. Thin rim of hypermetabolic activity throughout the peritoneal space consistent with peritoneal carcinomatosis. 2. Focal intense metabolic activity associated with the proximal small bowel consistent with lymphoma involvement of bowel. 3. Hypermetabolic mesenteric nodule  /node consistent with lymphoma. 4. Several small hypermetabolic lymph nodes in the RIGHT supraclavicular and internal mammary nodal stations. 5. Nodal metastasis anterior to the liver in the precordial space. This may be the most assessable for biopsy. 6. Normal spleen and bone marrow.   Electronically Signed   By: Suzy Bouchard M.D.   On: 07/23/2015 11:35   US Biopsy  07/17/2015   CLINICAL DATA:  Extensive peritoneal nodularity and omental caking of uncertain etiology. Periumbilical masses.  EXAM: ULTRASOUND-GUIDED CORE PERIUMBILICAL BIOPSY  TECHNIQUE: An ultrasound guided biopsy was thoroughly discussed with the patient and questions were answered. The benefits, risks, alternatives, and complications were also discussed. The patient understands and wishes to proceed with the procedure. A verbal as well as written consent was obtained.  Survey ultrasound of the periumbilical region was performed and an appropriate skin entry site was determined. Skin site was marked, prepped with chlorhexidine, and draped in usual sterile fashion, and infiltrated locally with 1% lidocaine.  Intravenous Fentanyl and Versed were administered as conscious sedation during continuous cardiorespiratory monitoring by the radiology RN, with a total moderate sedation time of less than 30 minutes.  Under real-time ultrasound guidance, a 17 gauge trocar  needle was advanced to the margin of the lesion. Once needle tip position  was confirmed, coaxial 18-gauge core biopsy samples were obtained, submitted in saline to surgical pathology. The guide needle was removed. Postprocedure scans show no hemorrhage or other apparent complication.  COMPLICATIONS: COMPLICATIONS None immediate  FINDINGS: Nodular periumbilical subcutaneous masses were localized. Core biopsy samples were obtained without complication.  IMPRESSION: 1. Technically successful ultrasound guided core periumbilical nodule biopsy.   Electronically Signed   By: Lucrezia Europe M.D.   On: 07/17/2015 16:15   US Paracentesis  07/17/2015   CLINICAL DATA:  Omental disease and peritoneal nodules.  EXAM: ULTRASOUND GUIDED PARACENTESIS  TECHNIQUE: The procedure, risks (including but not limited to bleeding, infection, organ damage ), benefits, and alternatives were explained to the patient. Questions regarding the procedure were encouraged and answered. The patient understands and consents to the procedure. Survey ultrasound of the abdomen was performed and an appropriate skin entry site in the right lower abdomen was selected. Skin site was marked, prepped with Betadine, and draped in usual sterile fashion, and infiltrated locally with 1% lidocaine. A 5 French multisidehole Yueh sheath needle was advanced into the peritoneal space until fluid could be aspirated. The sheath was advanced and the needle removed. 1.2 L of cloudy yellow ascites were aspirated. A sample sent to cytology for the requested studies.  COMPLICATIONS: COMPLICATIONS none  IMPRESSION: Technically successful ultrasound guided paracentesis, removing 1.2 L of ascites. Sample to cytology.   Electronically Signed   By: Lucrezia Europe M.D.   On: 07/17/2015 16:17   Ir Fluoro Guide Cv Line Right  07/27/2015   CLINICAL DATA:  New diagnosis of Burkitt's lymphoma. The patient requires a porta cath for chemotherapy.  EXAM: IMPLANTED PORT A CATH PLACEMENT WITH  ULTRASOUND AND FLUOROSCOPIC GUIDANCE  ANESTHESIA/SEDATION: 6.0 Mg IV Versed; 150 mcg IV Fentanyl  Total Moderate Sedation Time:  30 minutes  Additional Medications: 2 g IV Ancef. As antibiotic prophylaxis, Ancef was ordered pre-procedure and administered intravenously within one hour of incision.  FLUOROSCOPY TIME:  24 seconds.  PROCEDURE: The procedure, risks, benefits, and alternatives were explained to the patient. Questions regarding the procedure were encouraged and answered. The patient understands and consents to the procedure. A time-out was performed prior to the procedure.  Ultrasound was used to confirm patency of the right internal jugular vein. The right neck and chest were prepped with chlorhexidine in a sterile fashion, and a sterile drape was applied covering the operative field. Maximum barrier sterile technique with sterile gowns and gloves were used for the procedure. Local anesthesia was provided with 1% lidocaine.  After creating a small venotomy incision, a 21 gauge needle was advanced into the right internal jugular vein under direct, real-time ultrasound guidance. Ultrasound image documentation was performed. After securing guidewire access, an 8 Fr dilator was placed. A J-wire was kinked to measure appropriate catheter length.  A subcutaneous port pocket was then created along the upper chest wall utilizing sharp and blunt dissection. Portable cautery was utilized. The pocket was irrigated with sterile saline.  A single lumen power injectable port was chosen for placement. The 8 Fr catheter was tunneled from the port pocket site to the venotomy incision. The port was placed in the pocket. External catheter was trimmed to appropriate length based on guidewire measurement.  At the venotomy, an 8 Fr peel-away sheath was placed over a guidewire. The catheter was then placed through the sheath and the sheath removed. Final catheter positioning was confirmed and documented with a fluoroscopic spot  image. The port was accessed with a  needle and aspirated and flushed with heparinized saline. The needle was left in place for immediate use.  The venotomy and port pocket incisions were closed with subcutaneous 3-0 Monocryl and subcuticular 4-0 Vicryl. Dermabond was applied to both incisions.  COMPLICATIONS: None  FINDINGS: After catheter placement, the tip lies at the cavoatrial junction. The catheter aspirates normally and is ready for immediate use.  IMPRESSION: Placement of single lumen port a cath via right internal jugular vein. The catheter tip lies at the cavoatrial junction. A power injectable port a cath was placed and is ready for immediate use.   Electronically Signed   By: Aletta Edouard M.D.   On: 07/27/2015 15:28   Ir US Guide Vasc Access Right  07/27/2015   CLINICAL DATA:  New diagnosis of Burkitt's lymphoma. The patient requires a porta cath for chemotherapy.  EXAM: IMPLANTED PORT A CATH PLACEMENT WITH ULTRASOUND AND FLUOROSCOPIC GUIDANCE  ANESTHESIA/SEDATION: 6.0 Mg IV Versed; 150 mcg IV Fentanyl  Total Moderate Sedation Time:  30 minutes  Additional Medications: 2 g IV Ancef. As antibiotic prophylaxis, Ancef was ordered pre-procedure and administered intravenously within one hour of incision.  FLUOROSCOPY TIME:  24 seconds.  PROCEDURE: The procedure, risks, benefits, and alternatives were explained to the patient. Questions regarding the procedure were encouraged and answered. The patient understands and consents to the procedure. A time-out was performed prior to the procedure.  Ultrasound was used to confirm patency of the right internal jugular vein. The right neck and chest were prepped with chlorhexidine in a sterile fashion, and a sterile drape was applied covering the operative field. Maximum barrier sterile technique with sterile gowns and gloves were used for the procedure. Local anesthesia was provided with 1% lidocaine.  After creating a small venotomy incision, a 21 gauge needle  was advanced into the right internal jugular vein under direct, real-time ultrasound guidance. Ultrasound image documentation was performed. After securing guidewire access, an 8 Fr dilator was placed. A J-wire was kinked to measure appropriate catheter length.  A subcutaneous port pocket was then created along the upper chest wall utilizing sharp and blunt dissection. Portable cautery was utilized. The pocket was irrigated with sterile saline.  A single lumen power injectable port was chosen for placement. The 8 Fr catheter was tunneled from the port pocket site to the venotomy incision. The port was placed in the pocket. External catheter was trimmed to appropriate length based on guidewire measurement.  At the venotomy, an 8 Fr peel-away sheath was placed over a guidewire. The catheter was then placed through the sheath and the sheath removed. Final catheter positioning was confirmed and documented with a fluoroscopic spot image. The port was accessed with a needle and aspirated and flushed with heparinized saline. The needle was left in place for immediate use.  The venotomy and port pocket incisions were closed with subcutaneous 3-0 Monocryl and subcuticular 4-0 Vicryl. Dermabond was applied to both incisions.  COMPLICATIONS: None  FINDINGS: After catheter placement, the tip lies at the cavoatrial junction. The catheter aspirates normally and is ready for immediate use.  IMPRESSION: Placement of single lumen port a cath via right internal jugular vein. The catheter tip lies at the cavoatrial junction. A power injectable port a cath was placed and is ready for immediate use.   Electronically Signed   By: Aletta Edouard M.D.   On: 07/27/2015 15:28   Ct Biopsy  07/24/2015   CLINICAL DATA:  39 year old male with a new diagnosis of lymphoma.  EXAM: CT BIOPSY  Date: 07/24/2015  PROCEDURE: 1. CT guided bone marrow aspiration and core biopsy Interventional Radiologist:  Criselda Peaches, MD  ANESTHESIA/SEDATION:  Moderate (conscious) sedation was used. 2.5 mg Versed, 100 mcg Fentanyl were administered intravenously. The patient's vital signs were monitored continuously by radiology nursing throughout the procedure.  Sedation Time: 9 minutes  FLUOROSCOPY TIME:  None  TECHNIQUE: Informed consent was obtained from the patient following explanation of the procedure, risks, benefits and alternatives. The patient understands, agrees and consents for the procedure. All questions were addressed. A time out was performed.  The patient was positioned prone and noncontrast localization CT was performed of the pelvis to demonstrate the iliac marrow spaces.  Maximal barrier sterile technique utilized including caps, mask, sterile gowns, sterile gloves, large sterile drape, hand hygiene, and betadine prep.  Under sterile conditions and local anesthesia, an 11 gauge coaxial bone biopsy needle was advanced into the right iliac marrow space. Needle position was confirmed with CT imaging. Initially, bone marrow aspiration was performed. Next, the 11 gauge outer cannula was utilized to obtain a right iliac bone marrow core biopsy. Needle was removed. Hemostasis was obtained with compression. The patient tolerated the procedure well. Samples were prepared with the cytotechnologist. No immediate complications.  IMPRESSION: CT guided right iliac bone marrow aspiration and core biopsy. The On-Control drill was used.  Signed,  Criselda Peaches, MD  [facility]  [facility]   Electronically Signed   By: Jacqulynn Cadet M.D.   On: 07/24/2015 12:32   Dg Chest Port 1 View  07/22/2015   CLINICAL DATA:  Pulmonary edema, hypoxia  EXAM: PORTABLE CHEST - 1 VIEW  COMPARISON:  07/21/2015  FINDINGS: Heart size is normal. Right-sided PICC line in place with tip over the cavoatrial junction. Hazy perihilar rate greater than left basilar airspace opacities are slightly decreased. Trace pleural effusions.   IMPRESSION: Interval slight decrease in hazy perihilar airspace opacities compatible with improving edema.   Electronically Signed   By: Conchita Paris M.D.   On: 07/22/2015 08:10   Dg Chest Port 1 View  07/21/2015   CLINICAL DATA:  Acute respiratory failure with hypoxemia. Nonsmoker.  EXAM: PORTABLE CHEST - 1 VIEW  COMPARISON:  07/20/2015.  FINDINGS: Cardiomediastinal silhouette appears within normal limits for size. Diffuse pulmonary opacities persist but are slightly improved consistent with resolving edema. Decreased BILATERAL pleural effusions. PICC line tip SVC. No pneumothorax.  IMPRESSION: Improving pulmonary edema.   Electronically Signed   By: Staci Righter M.D.   On: 07/21/2015 09:38   Dg Chest Port 1 View  07/20/2015   CLINICAL DATA:  Shortness of Breath  EXAM: PORTABLE CHEST - 1 VIEW  COMPARISON:  July 15, 2015  FINDINGS: There is alveolar edema throughout both lungs. There are small bilateral effusions. Heart is upper normal in size. There is mild pulmonary venous hypertension. No adenopathy.  IMPRESSION: Extensive edema. Question a degree of congestive heart failure versus noncardiogenic edema. Note that cardiac silhouette is unchanged compared to recent prior study. No adenopathy apparent.   Electronically Signed   By: Lowella Grip III M.D.   On: 07/20/2015 15:36   Dg Fluoro Guide Lumbar Puncture  07/24/2015   CLINICAL DATA:  Lymphoma  EXAM: DIAGNOSTIC LUMBAR PUNCTURE UNDER FLUOROSCOPIC GUIDANCE  FLUOROSCOPY TIME:  Radiation Exposure Index (as provided by the fluoroscopic device):  If the device does not provide the exposure index:  Fluoroscopy Time (in minutes and seconds):  13 seconds  Number of Acquired Images:  0  PROCEDURE: Informed consent was obtained from the patient prior to the procedure, including potential complications of headache, allergy, and pain. With the patient prone, the lower back was prepped with Betadine. 1% Lidocaine was used for local anesthesia. Lumbar  puncture was performed at the L3-4 level using a 22 gauge needle with return of clear CSF with an opening pressure of 14 cm water. 12 ml of CSF were obtained for laboratory studies. The patient tolerated the procedure well and there were no apparent complications.  IMPRESSION: 1. Technically successful lumbar puncture. 12 cc of clear CSF were obtained for laboratory studies.   Electronically Signed   By: Kerby Moors M.D.   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 ? EBV related given h/o bothersome infection in 2013. PET/CT with significant intra-abdominal involvement, small intestinal involvement, some chest LNadenopathy as noted above. Bone Marrow Biopsy - no evidence of lymphoma CSF - no overt evidence of CNS involvement by lymphoma 2) Post-operative hypoxic respiratory failure with CXR showing b/l infiltrates ?Fluid overload - resolved with light diuresis. 2) Spontaneous TLS - uric acid normalized with allopurinol. Some bump in LFts now improved. Restarted on lower dose of Allopurinol. Some increase in phosphorus consistent with tumor lysis. 3) AKI ?related to contrast/urate nephropathy less likely urinary obstruction. Creatinine has now improved from 1.54 to 1.1 to 0.9 4) Elevated transaminases - ? Related to allopurinol vs lymphomatous involvement. 5) Elevated free T4 - ? Related to his supplements vs ?thyroiditis vs paraneoplastic process. 6) Asymptomatic bradycardia - physiologic to a great extent and with sedation.Plan Doubt acute anthracycline toxicity. conitnue to monitor. If hypotensive or symptomatic will need telemetry. Remains stable today. Plan  -continue current dose of allopurinol -continue IVF with bicarb for TLS protection -prn lasix if signs of fluid overload. -continue EPOCH-R started 07/24/2014 with Rituxan  day 5. -will need neulasta day 6 or 7 as outpatient. (on 07/31/2015) -clinic followup on 08/03/2015 -cbc, phosphorus, magnesium daily, cmp and uric acid twice daily -Rasburicase 80m IVPB daily prn for uric acid >10 (has not  Needed this) -IT Methotrexate for CNS prophylaxis Day 1 and Day 5 from cycle 2 or 3 (Started in cycle 3 in the NEJM study) -rpt PET/CT after 2 cycles.   Will continue to follow daily. Appreciate cares by hospitalist team. Kindly call if questions.   GSullivan LoneMD MNewnanAAHIVMS SSt Joseph Medical CenterCSaint Joseph HospitalHematology/Oncology Physician [facility] (Office):       [phone](Work cell):  [phone](Fax):           [phone] 07/28/2015

## 2015-07-29 DIAGNOSIS — Z5111 Encounter for antineoplastic chemotherapy: Secondary | ICD-10-CM | POA: Insufficient documentation

## 2015-07-29 DIAGNOSIS — K1230 Oral mucositis (ulcerative), unspecified: Secondary | ICD-10-CM

## 2015-07-29 DIAGNOSIS — K5900 Constipation, unspecified: Secondary | ICD-10-CM | POA: Insufficient documentation

## 2015-07-29 LAB — COMPREHENSIVE METABOLIC PANEL
ALBUMIN: 3.2 g/dL — AB (ref 3.5–5.0)
ALK PHOS: 45 U/L (ref 38–126)
ALT: 108 U/L — ABNORMAL HIGH (ref 17–63)
ALT: 204 U/L — ABNORMAL HIGH (ref 17–63)
ANION GAP: 7 (ref 5–15)
ANION GAP: 9 (ref 5–15)
AST: 51 U/L — ABNORMAL HIGH (ref 15–41)
AST: 136 U/L — AB (ref 15–41)
Albumin: 2.7 g/dL — ABNORMAL LOW (ref 3.5–5.0)
Alkaline Phosphatase: 58 U/L (ref 38–126)
BILIRUBIN TOTAL: 0.9 mg/dL (ref 0.3–1.2)
BILIRUBIN TOTAL: 0.8 mg/dL (ref 0.3–1.2)
BUN: 21 mg/dL — AB (ref 6–20)
BUN: 22 mg/dL — AB (ref 6–20)
CALCIUM: 8.3 mg/dL — AB (ref 8.9–10.3)
CHLORIDE: 102 mmol/L (ref 101–111)
CO2: 26 mmol/L (ref 22–32)
CO2: 24 mmol/L (ref 22–32)
Calcium: 8.3 mg/dL — ABNORMAL LOW (ref 8.9–10.3)
Chloride: 105 mmol/L (ref 101–111)
Creatinine, Ser: 0.74 mg/dL (ref 0.61–1.24)
Creatinine, Ser: 0.83 mg/dL (ref 0.61–1.24)
GFR calc Af Amer: >60 mL/min (ref >60)
GFR calc Af Amer: >60 mL/min (ref >60)
Glucose, Bld: 89 mg/dL (ref 65–99)
Glucose, Bld: 141 mg/dL — ABNORMAL HIGH (ref 65–99)
POTASSIUM: 3.6 mmol/L (ref 3.5–5.1)
POTASSIUM: 3.8 mmol/L (ref 3.5–5.1)
Sodium: 138 mmol/L (ref 135–145)
Sodium: 135 mmol/L (ref 135–145)
TOTAL PROTEIN: 5 g/dL — AB (ref 6.5–8.1)
TOTAL PROTEIN: 5.8 g/dL — AB (ref 6.5–8.1)

## 2015-07-29 LAB — CBC WITH DIFFERENTIAL/PLATELET
Basophils Absolute: 0 10*3/uL (ref 0.0–0.1)
Basophils Relative: 0 %
EOS ABS: 0 10*3/uL (ref 0.0–0.7)
EOS PCT: 0 %
HCT: 35.6 % — ABNORMAL LOW (ref 39.0–52.0)
Hemoglobin: 12.2 g/dL — ABNORMAL LOW (ref 13.0–17.0)
LYMPHS ABS: 1.9 10*3/uL (ref 0.7–4.0)
Lymphocytes Relative: 23 %
MCH: 29 pg (ref 26.0–34.0)
MCHC: 34.3 g/dL (ref 30.0–36.0)
MCV: 84.8 fL (ref 78.0–100.0)
MONO ABS: 0.5 10*3/uL (ref 0.1–1.0)
MONOS PCT: 6 %
Neutro Abs: 5.6 10*3/uL (ref 1.7–7.7)
Neutrophils Relative %: 71 %
PLATELETS: 262 10*3/uL (ref 150–400)
RBC: 4.2 MIL/uL — ABNORMAL LOW (ref 4.22–5.81)
RDW: 12.9 % (ref 11.5–15.5)
WBC: 8 10*3/uL (ref 4.0–10.5)

## 2015-07-29 LAB — MAGNESIUM: MAGNESIUM: 1.9 mg/dL (ref 1.7–2.4)

## 2015-07-29 LAB — PHOSPHORUS: Phosphorus: 3.4 mg/dL (ref 2.5–4.6)

## 2015-07-29 LAB — URIC ACID
URIC ACID, SERUM: 3.5 mg/dL — AB (ref 4.4–7.6)
URIC ACID, SERUM: 3.2 mg/dL — AB (ref 4.4–7.6)

## 2015-07-29 MED ORDER — MAGNESIUM CITRATE PO SOLN: 1.0000 | ORAL | Status: DC | PRN

## 2015-07-29 MED ORDER — HEPARIN SOD (PORK) LOCK FLUSH 100 UNIT/ML IV SOLN: 250.0000 [IU] | Freq: Once | INTRAVENOUS | Status: DC | PRN

## 2015-07-29 MED ORDER — EPINEPHRINE HCL 0.1 MG/ML IJ SOSY: 0.2500 mg | PREFILLED_SYRINGE | Freq: Once | INTRAMUSCULAR | Status: DC | PRN

## 2015-07-29 MED ORDER — ALTEPLASE 2 MG IJ SOLR: 2.0000 mg | Freq: Once | INTRAMUSCULAR | Status: DC | PRN

## 2015-07-29 MED ORDER — SODIUM CHLORIDE 0.9 % IV SOLN: Freq: Once | INTRAVENOUS | Status: DC | PRN

## 2015-07-29 MED ORDER — FAMOTIDINE IN NACL 20-0.9 MG/50ML-% IV SOLN: 20.0000 mg | Freq: Once | INTRAVENOUS | Status: DC | PRN

## 2015-07-29 MED ORDER — SODIUM BICARBONATE/SODIUM CHLORIDE MOUTHWASH
Freq: Four times a day (QID) | OROMUCOSAL | Status: DC
  Administered 2015-07-29 – 2015-07-30 (×5): via OROMUCOSAL
  Filled 2015-07-29: qty 1000

## 2015-07-29 MED ORDER — DIPHENHYDRAMINE HCL 50 MG PO CAPS: 50.0000 mg | ORAL_CAPSULE | Freq: Once | ORAL | Status: DC

## 2015-07-29 MED ORDER — ACETAMINOPHEN 325 MG PO TABS: 650.0000 mg | ORAL_TABLET | Freq: Once | ORAL | Status: DC

## 2015-07-29 MED ORDER — METHYLPREDNISOLONE SODIUM SUCC 125 MG IJ SOLR: 125.0000 mg | Freq: Once | INTRAMUSCULAR | Status: DC | PRN

## 2015-07-29 MED ORDER — DIPHENHYDRAMINE HCL 50 MG/ML IJ SOLN: 50.0000 mg | Freq: Once | INTRAMUSCULAR | Status: DC | PRN

## 2015-07-29 MED ORDER — HEPARIN SOD (PORK) LOCK FLUSH 100 UNIT/ML IV SOLN
500.0000 [IU] | Freq: Once | INTRAVENOUS | Status: DC | PRN
  Filled 2015-07-29: qty 5

## 2015-07-29 MED ORDER — SODIUM CHLORIDE 0.9 % IV SOLN
Freq: Once | INTRAVENOUS | Status: AC
  Administered 2015-07-29: 11:00:00 via INTRAVENOUS

## 2015-07-29 MED ORDER — ACETAMINOPHEN 325 MG PO TABS
650.0000 mg | ORAL_TABLET | Freq: Once | ORAL | Status: AC
  Administered 2015-07-30: 650 mg via ORAL
  Filled 2015-07-29: qty 2

## 2015-07-29 MED ORDER — DIPHENHYDRAMINE HCL 50 MG PO CAPS
50.0000 mg | ORAL_CAPSULE | Freq: Once | ORAL | Status: AC
  Administered 2015-07-30: 50 mg via ORAL
  Filled 2015-07-29: qty 1

## 2015-07-29 MED ORDER — SODIUM CHLORIDE 0.9 % IJ SOLN
10.0000 mL | INTRAMUSCULAR | Status: DC | PRN
Start: 2015-07-29 — End: 2015-07-30

## 2015-07-29 MED ORDER — SODIUM CHLORIDE 0.9 % IV SOLN
375.0000 mg/m2 | Freq: Once | INTRAVENOUS | Status: DC
  Filled 2015-07-29: qty 80

## 2015-07-29 MED ORDER — CYCLOPHOSPHAMIDE CHEMO INJECTION 1 GM
750.0000 mg/m2 | Freq: Once | INTRAMUSCULAR | Status: AC
  Administered 2015-07-29: 1600 mg via INTRAVENOUS
  Filled 2015-07-29 (×2): qty 80

## 2015-07-29 MED ORDER — EPINEPHRINE HCL 1 MG/ML IJ SOLN: 0.5000 mg | Freq: Once | INTRAMUSCULAR | Status: DC | PRN

## 2015-07-29 MED ORDER — DIPHENHYDRAMINE HCL 50 MG/ML IJ SOLN: 25.0000 mg | Freq: Once | INTRAMUSCULAR | Status: DC | PRN

## 2015-07-29 MED ORDER — SODIUM CHLORIDE 0.9 % IV SOLN
375.0000 mg/m2 | Freq: Once | INTRAVENOUS | Status: AC
  Administered 2015-07-30: 800 mg via INTRAVENOUS
  Filled 2015-07-29: qty 80

## 2015-07-29 MED ORDER — SODIUM CHLORIDE 0.9 % IJ SOLN: 3.0000 mL | INTRAMUSCULAR | Status: DC | PRN

## 2015-07-29 MED ORDER — ALBUTEROL SULFATE (2.5 MG/3ML) 0.083% IN NEBU: 2.5000 mg | INHALATION_SOLUTION | Freq: Once | RESPIRATORY_TRACT | Status: DC | PRN

## 2015-07-29 MED ORDER — SODIUM CHLORIDE 0.9 % IV SOLN
16.0000 mg | Freq: Once | INTRAVENOUS | Status: AC
Start: 2015-07-29 — End: 2015-07-29
  Administered 2015-07-29: 16 mg via INTRAVENOUS
  Filled 2015-07-29: qty 8

## 2015-07-29 MED ORDER — EPINEPHRINE HCL 1 MG/ML IJ SOLN
0.5000 mg | Freq: Once | INTRAMUSCULAR | Status: DC | PRN
Start: 2015-07-29 — End: 2015-07-30

## 2015-07-29 NOTE — Progress Notes (Signed)
RN spoke with MD regarding scheduling of rituximab infusion. Concern for late start time of the drug and potential for transfusion related reaction. Keeping this in mind, MD feels it is better to start infusion in the am.  Pharmacy notified to change start time for Rituximab infusion.

## 2015-07-29 NOTE — Progress Notes (Signed)
Cyclophosphamide dosage calculation verified with Delora Fuel RN.

## 2015-07-29 NOTE — Progress Notes (Signed)
[name]   HEMATOLOGY/ONCOLOGY INPATIENT PROGRESS NOTE  Date of Service: 07/29/2015  Inpatient Attending: .Elmarie Shiley, MD   SUBJECTIVE  Patient was seen this AM. He notes no acute new concerns. Was feeling a little emotional heavy and we spent a fair amount of time talking about his concerns and life in general. No nausea. Eating well. Had a small bowel movement last evening and passing gas but notes no "satisfying bowel movement". He notes that he does not "feel constipated". No fevers/chills. Some change in taste -ordered sodium bicarb/sodium chloride mouthwashes.  OBJECTIVE:  PHYSICAL EXAMINATION: . Filed Vitals:   07/28/15 0539 07/28/15 1436 07/28/15 2015 07/29/15 0558  BP:  117/64 119/71 121/73  Pulse:  53 50 51  Temp:  98.3 F (36.8 C) 98.5 F (36.9 C) 98.6 F (37 C)  TempSrc:  Oral Oral Other (Comment)  Resp:  _0 Height:      Weight: 196 lb (88.905 kg)     SpO2:  97% 96% 96%   Filed Weights   07/26/15 0700 07/27/15 0505 07/28/15 0539  Weight: 195 lb 11.2 oz (88.769 kg) 194 lb 3.2 oz (88.089 kg) 196 lb (88.905 kg)   .Body mass index is 27.35 kg/(m^2).  GENERAL: SKIN:no acute rashes. EYES: normal, conjunctiva are pink and non-injected, sclera clear OROPHARYNX:no exudate, no erythema and lips, buccal mucosa, and tongue normal  NECK: supple, no JVD, thyroid normal size, non-tender, without nodularity LYMPH:  no palpable lymphadenopathy in the cervical, axillary or inguinal LUNGS: CTA bilaterally HEART: regular rate & rhythm,  no murmurs and trace lower extremity edema ABDOMEN: abdomen mildly distended (improved), BS normoactive, laparoscopic incisions clean and healing well. PSYCH: alert & oriented x 3 with fluent speech NEURO: no focal motor/sensory deficits  MEDICAL HISTORY:  Past Medical History  Diagnosis Date  . EBV infection 2013    Patient notes that he had an EBV infection in 2013 that left him with chronic fatigue. She also notes that he had  significant lymphadenopathy and thyroiditis at the time. He has been managing his symptoms with an alternative medicine practitioner in [place] and with other alternative medicines.  . Marijuana use, episodic     SURGICAL HISTORY: Past Surgical History  Procedure Laterality Date  . Tonsillectomy    . Appendectomy    . Laparoscopy N/A 07/20/2015    Procedure: LAPAROSCOPY DIAGNOSTIC, MULTIPLE BIOPSIES, DRAINAGE OF ASCITES;  Surgeon: Fanny Skates, MD;  Location: [facility];  Service: General;  Laterality: N/A;    SOCIAL HISTORY: Social History   Social History  . Marital Status: Married    Spouse Name: N/A  . Number of Children: N/A  . Years of Education: N/A   Occupational History  . Not on file.   Social History Main Topics  . Smoking status: Never Smoker   . Smokeless tobacco: Not on file  . Alcohol Use: No  . Drug Use: 1.00 per week    Special: Marijuana  . Sexual Activity: Not on file   Other Topics Concern  . 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   He has a fiance and has been in a steady relationship.      He has tried to maintain a very healthy lifestyle and cycles about 100 miles a week. He also uses a fair number of over-the-counter alternative medicines to stay healthy.    FAMILY HISTORY: Family History  Problem Relation Age of Onset  . Heart failure Father     ALLERGIES:  is allergic to fluoride preparations and sulfa antibiotics.  MEDICATIONS:  Scheduled Meds: . acetaminophen  650 mg Oral Once  . allopurinol  150 mg Oral BID  . cyclophosphamide  750 mg/m2 (Treatment Plan Actual) Intravenous Once  . diphenhydrAMINE  50 mg Oral Once  . DOXOrubicin/vinCRIStine/etoposide CHEMO IV infusion for Inpatient CI   Intravenous Once  . enoxaparin (LOVENOX) injection  40 mg Subcutaneous Q24H  . ondansetron (ZOFRAN) IV  16 mg Intravenous Once  . pantoprazole  40  mg Oral BID AC  . polyethylene glycol  17 g Oral BID  . riTUXimab (RITUXAN) IV infusion  375 mg/m2 (Treatment Plan Actual) Intravenous Once  . senna-docusate  2 tablet Oral BID  . sodium chloride  10-40 mL Intracatheter Q12H   Continuous Infusions: . sodium chloride 10 mL/hr at 07/15/15 1023  . sodium chloride 10 mL/hr at 07/25/15 1056  .  sodium bicarbonate  infusion 1000 mL 75 mL/hr at 07/29/15 0741   PRN Meds:.sodium chloride, albuterol, alteplase, alteplase, Cold Pack, diphenhydrAMINE, diphenhydrAMINE, EPINEPHrine, EPINEPHrine, EPINEPHrine, EPINEPHrine, famotidine, fludeoxyglucose F - 18, gi cocktail, heparin lock flush, heparin lock flush, heparin lock flush, heparin lock flush, Hot Pack, HYDROcodone-acetaminophen, lactulose, LORazepam, methylPREDNISolone sodium succinate, morphine injection, ondansetron (ZOFRAN) IV, rasburicase (ELITEK) IV infusion, sodium chloride, sodium chloride, sodium chloride, sodium chloride, sodium chloride  REVIEW OF SYSTEMS:    10 Point review of Systems was done is negative except as noted above.   LABORATORY DATA:  I have reviewed the data as listed  . CBC Latest Ref Rng 07/29/2015 07/28/2015 07/27/2015  WBC 4.0 - 10.5 K/uL 8.0 8.8 15.1(H)  Hemoglobin 13.0 - 17.0 g/dL 12.2(L) 11.8(L) 12.1(L)  Hematocrit 39.0 - 52.0 % 35.6(L) 34.8(L) 35.3(L)  Platelets 150 - 400 K/uL 262 253 248   . CBC    Component Value Date/Time   WBC 8.0 07/29/2015 0635   RBC 4.20* 07/29/2015 0635   RBC 5.39 07/18/2015 0920   HGB 12.2* 07/29/2015 0635   HCT 35.6* 07/29/2015 0635   PLT 262 07/29/2015 0635   MCV 84.8 07/29/2015 0635   MCH 29.0 07/29/2015 0635   MCHC 34.3 07/29/2015 0635   RDW 12.9 07/29/2015 0635   LYMPHSABS 1.9 07/29/2015 0635   MONOABS 0.5 07/29/2015 0635   EOSABS 0.0 07/29/2015 0635   BASOSABS 0.0 07/29/2015 0635      . CMP Latest Ref Rng 07/29/2015 07/28/2015 07/28/2015  Glucose 65 - 99 mg/dL 89 123(H) 115(H)  BUN 6 - 20 mg/dL 21(H) 22(H) 23(H)    Creatinine 0.61 - 1.24 mg/dL 0.74 0.83 0.78  Sodium 135 - 145 mmol/L 138 136 138  Potassium 3.5 - 5.1 mmol/L 3.6 3.9 4.0  Chloride 101 - 111 mmol/L 105 104 106  CO2 22 - 32 mmol/L _0 Calcium 8.9 - 10.3 mg/dL 8.3(L) 8.3(L) 8.3(L)  Total Protein 6.5 - 8.1 g/dL 5.0(L) 5.5(L) 5.0(L)  Total Bilirubin 0.3 - 1.2 mg/dL 0.9 0.8 0.7  Alkaline Phos 38 - 126 U/L 45 49 43  AST 15 - 41 U/L 51(H) 66(H) 63(H)  ALT 17 - 63 U/L 108(H) 134(H) 125(H)   Component     Latest Ref Rng 07/24/2015  Tube #      1  Color, CSF     COLORLESS COLORLESS  Appearance, CSF     CLEAR CLEAR (A)  Supernatant      NOT INDICATED  RBC Count,  CSF     0 /cu mm 2 (H)  WBC, CSF     0 - 5 /cu mm 1  Other Cells, CSF      TOO FEW TO COUNT, SMEAR AVAILABLE FOR REVIEW  Specimen Description      CSF  Special Requests      NONE  Gram Stain      WBC PRESENT, PREDOMINANTLY MONONUCLEAR . . .  Culture      PENDING  Report Status      PENDING  Glucose, CSF     40 - 70 mg/dL 59  Total  Protein, CSF     15 - 45 mg/dL 15     RADIOGRAPHIC STUDIES: I have personally reviewed the radiological images as listed and agreed with the findings in the report. Dg Chest 2 View  07/15/2015   CLINICAL DATA:  Followup right pleural effusion, status post right thoracentesis.  EXAM: CHEST  2 VIEW  COMPARISON:  Yesterday.  FINDINGS: Stable right upper companion rib shadows. No pneumothorax seen. Significantly decreased right pleural fluid. The interstitial markings remain mildly prominent. Normal sized heart. Interval mild left lower lobe linear density. Unremarkable bones.  IMPRESSION: 1. No pneumothorax following right thoracentesis. 2. Significantly less right pleural fluid. 3. Interval mild left lower lobe atelectasis. 4. Stable mild chronic interstitial lung disease.   Electronically Signed   By: Claudie Revering M.D.   On: 07/15/2015 17:21   Dg Chest 2 View  07/14/2015   CLINICAL DATA:  Right-sided chest pain for 2-3 days. Shortness  of breath.  EXAM: CHEST  2 VIEW  COMPARISON:  07/12/2015  FINDINGS: Heart size is probably normal. Suspect lung opacity at the right base in addition to the elevated hemidiaphragm. Opacity at the right lung base may be related to subpulmonic effusion. Overall the appearance is stable since previous exam. There is a trace left pleural effusion. No pulmonary edema.  IMPRESSION: Persistent significant opacity at the right lung base. Consider right lateral decubitus view to determine component of layering pleural effusion.   Electronically Signed   By: Nolon Nations M.D.   On: 07/14/2015 16:56   Dg Chest 2 View  07/12/2015   CLINICAL DATA:  Anterior chest pain for 1 week with cough  EXAM: CHEST  2 VIEW  COMPARISON:  None.  FINDINGS: There is mild elevation the right hemidiaphragm. There is evidence suggesting consolidation in the medial right base. Lungs elsewhere clear. Heart size and pulmonary vascularity are normal. No adenopathy. No bone lesions.  IMPRESSION: Evidence of a degree of consolidation in the medial right base. There is elevation of the right hemidiaphragm. Lungs elsewhere clear. Cardiac silhouette within normal limits.  Followup PA and lateral chest radiographs recommended in 3-4 weeks following trial of antibiotic therapy to ensure resolution and exclude underlying malignancy.   Electronically Signed   By: Lowella Grip III M.D.   On: 07/12/2015 15:52   Ct Angio Chest Pe W/cm &/or Wo Cm  07/14/2015   CLINICAL DATA:  Right chest pain  EXAM: CT ANGIOGRAPHY CHEST WITH CONTRAST  TECHNIQUE: Multidetector CT imaging of the chest was performed using the standard protocol during bolus administration of intravenous contrast. Multiplanar CT image reconstructions and MIPs were obtained to evaluate the vascular anatomy.  CONTRAST:  133m OMNIPAQUE IOHEXOL 350 MG/ML SOLN  COMPARISON:  Chest x-ray 07/14/2015  FINDINGS: Negative for pulmonary embolism. Negative for aortic dissection or aneurysm  Moderately  large right pleural effusion with compressive atelectasis of the right  lower lobe. Atelectasis also present in the right middle lobe. Partial collapse of the right upper lobe medially adjacent to the mediastinum. This extends into the right upper lobe laterally. Small left pleural effusion. No significant infiltrate on the left.  No adenopathy is present in the mediastinum.  No acute bony abnormality  In the upper abdomen, there is mild to moderate ascites. This could be due to liver disease. There is fluid around the liver and spleen. The spleen is not enlarged. Limited imaging of the pancreas and kidneys are grossly normal. There is soft tissue infiltration in the omentum. Carcinoma not excluded.  Review of the MIP images confirms the above findings.  IMPRESSION: Negative for pulmonary embolism  Moderate right pleural effusion with compressive atelectasis in the right lower lobe. Additional atelectasis in the right middle lobe and right upper lobe. No definite mass or pneumonia however infection and tumor are in the differential. There is a small left effusion  Concerning findings in the abdomen. There is ascites around the liver and spleen. There is soft tissue density involving the omentum which can be seen with carcinoma. Further imaging of the abdomen with CT abdomen pelvis with contrast is suggested.   Electronically Signed   By: Franchot Gallo M.D.   On: 07/14/2015 18:28   Ct Abdomen Pelvis W Contrast  07/15/2015   CLINICAL DATA:  39 year old male with ascites and potential omental disease noted on prior chest CT. Follow-up abdominal CT scan.  EXAM: CT ABDOMEN AND PELVIS WITH CONTRAST  TECHNIQUE: Multidetector CT imaging of the abdomen and pelvis was performed using the standard protocol following bolus administration of intravenous contrast.  CONTRAST:  165m OMNIPAQUE IOHEXOL 300 MG/ML  SOLN  COMPARISON:  CT of the chest 07/14/2015. CT of the pelvis 03/03/2008.  FINDINGS: Lower chest: Small bilateral  pleural effusions (left greater than right). Subsegmental atelectasis in the posterior aspect of the right lower lobe.  Hepatobiliary: No cystic or solid hepatic lesion identified. No intra or extrahepatic biliary ductal dilatation. High attenuation material filling the gallbladder, presumably vicarious excretion of contrast material from the recent chest CT.  Pancreas: No pancreatic mass. No pancreatic ductal dilatation. No pancreatic or peripancreatic fluid or inflammatory changes.  Spleen: Unremarkable.  Adrenals/Urinary Tract: Bilateral adrenal glands and bilateral kidneys are unremarkable in appearance. No hydroureteronephrosis. Urinary bladder is unremarkable in appearance.  Stomach/Bowel: In the mid small bowel (like mid to distal jejunum) there is a profoundly asymmetrically thickened loop of bowel best demonstrated on image 56 of series 2, where the wall measures up to 22 mm in thickness medially. Adjacent to this there are soft tissue masses extending into the root of the small bowel mesentery, largest of which measures 2.7 x 3.3 cm (image 47 of series 2), likely malignant lymph nodes. The appendix is not confidently identified. No pathologic dilatation of small bowel or colon. Stomach is grossly normal in appearance.  Vascular/Lymphatic: No significant atherosclerotic disease, aneurysm or dissection identified in the abdominal or pelvic vasculature. Circumaortic left renal vein (normal anatomical variant) incidentally noted. Probable mesenteric lymphadenopathy, as discussed above.  Reproductive: Prostate gland and seminal vesicles are unremarkable in appearance. Inguinal canals and visualized portions of the scrotum were grossly unremarkable in appearance. No lymphadenopathy noted along the gonadal drainage system.  Other: Large amount of abnormal enhancing soft tissue throughout the peritoneal cavity, most notable in the omentum, particularly in the right upper quadrant where there is bulky omental  caking. Small volume of presumably malignant ascites. No pneumoperitoneum. Some  abnormal soft tissue is extending through the anterior abdominal wall into the periumbilical region (image 53 of series 2).  Musculoskeletal: There are no aggressive appearing lytic or blastic lesions noted in the visualized portions of the skeleton.  IMPRESSION: 1. Widespread intraperitoneal malignancy, as demonstrated by extensive peritoneal nodularity and enhancement, widespread omental caking, likely malignant ascites and extensive mesenteric lymphadenopathy. The exact source of malignancy is uncertain, however, given the focally thickened loop of small bowel (likely jejunal) in the left side of the central abdomen (image 56 of series 2), a primary small bowel malignancy is suspected. 2. Small bilateral pleural effusions (left greater than right). Right-sided pleural effusion significantly decreased following thoracentesis. Resolving subsegmental atelectasis in the right lower lobe. 3. Additional incidental findings, as above. These results were called by telephone at the time of interpretation on 07/15/2015 at 5:33 pm to Dr. Olevia Bowens, who verbally acknowledged these results.   Electronically Signed   By: Vinnie Langton M.D.   On: 07/15/2015 17:34   Nm Pet Image Initial (pi) Skull Base To Thigh  07/23/2015   CLINICAL DATA:  Initial treatment strategy for high-grade non-Hodgkin's lymphoma. B-cell lymphoma.  EXAM: NUCLEAR MEDICINE PET SKULL BASE TO THIGH  TECHNIQUE: 10.0 mCi F-18 FDG was injected intravenously. Full-ring PET imaging was performed from the skull base to thigh after the radiotracer. CT data was obtained and used for attenuation correction and anatomic localization.  FASTING BLOOD GLUCOSE:  Value: 88 mg/dl  COMPARISON:  CT abdomen 07/15/2015, CT thorax 07/14/2015  FINDINGS: NECK  Small hypermetabolic lymph nodes the just deep to the RIGHT clavicle measure 9 mm short axis on image 54, series 4 with intense metabolic  activity (SUV max 6.2).  CHEST  Small hypermetabolic internal mammary lymph nodes on the RIGHT (image 66 with SUV max equal 5.). Small hypermetabolic nodule posterior to the sternum in the anterior mediastinum on image 69 of fused data set.  Within the precordial space anterior to the liver there is a 4.5 cm hypermetabolic mass. Smaller hypermetabolic nodule anterior to the RIGHT ventricle.  ABDOMEN/PELVIS  There is a thin rim of intense metabolic activity associated entirety of the peritoneal surface consists with peritoneal metastasis / carcinomatosis. There is more focal activity within the LEFT abdomen associated with the small bowel wall thickening. This segmental thickening measures 17 mm in single wall thickness with intense metabolic activity (SUV max 14.6). Central mesenteric mass measures 3 cm with SUV max 13.2.  There are no hypermetabolic inguinal or iliac lymph nodes.  SKELETON  No focal hypermetabolic activity to suggest skeletal metastasis.  IMPRESSION: 1. Thin rim of hypermetabolic activity throughout the peritoneal space consistent with peritoneal carcinomatosis. 2. Focal intense metabolic activity associated with the proximal small bowel consistent with lymphoma involvement of bowel. 3. Hypermetabolic mesenteric nodule  /node consistent with lymphoma. 4. Several small hypermetabolic lymph nodes in the RIGHT supraclavicular and internal mammary nodal stations. 5. Nodal metastasis anterior to the liver in the precordial space. This may be the most assessable for biopsy. 6. Normal spleen and bone marrow.   Electronically Signed   By: Suzy Bouchard M.D.   On: 07/23/2015 11:35   US Biopsy  07/17/2015   CLINICAL DATA:  Extensive peritoneal nodularity and omental caking of uncertain etiology. Periumbilical masses.  EXAM: ULTRASOUND-GUIDED CORE PERIUMBILICAL BIOPSY  TECHNIQUE: An ultrasound guided biopsy was thoroughly discussed with the patient and questions were answered. The benefits, risks,  alternatives, and complications were also discussed. The patient understands and wishes to proceed with  the procedure. A verbal as well as written consent was obtained.  Survey ultrasound of the periumbilical region was performed and an appropriate skin entry site was determined. Skin site was marked, prepped with chlorhexidine, and draped in usual sterile fashion, and infiltrated locally with 1% lidocaine.  Intravenous Fentanyl and Versed were administered as conscious sedation during continuous cardiorespiratory monitoring by the radiology RN, with a total moderate sedation time of less than 30 minutes.  Under real-time ultrasound guidance, a 17 gauge trocar needle was advanced to the margin of the lesion. Once needle tip position was confirmed, coaxial 18-gauge core biopsy samples were obtained, submitted in saline to surgical pathology. The guide needle was removed. Postprocedure scans show no hemorrhage or other apparent complication.  COMPLICATIONS: COMPLICATIONS None immediate  FINDINGS: Nodular periumbilical subcutaneous masses were localized. Core biopsy samples were obtained without complication.  IMPRESSION: 1. Technically successful ultrasound guided core periumbilical nodule biopsy.   Electronically Signed   By: Lucrezia Europe M.D.   On: 07/17/2015 16:15   US Paracentesis  07/17/2015   CLINICAL DATA:  Omental disease and peritoneal nodules.  EXAM: ULTRASOUND GUIDED PARACENTESIS  TECHNIQUE: The procedure, risks (including but not limited to bleeding, infection, organ damage ), benefits, and alternatives were explained to the patient. Questions regarding the procedure were encouraged and answered. The patient understands and consents to the procedure. Survey ultrasound of the abdomen was performed and an appropriate skin entry site in the right lower abdomen was selected. Skin site was marked, prepped with Betadine, and draped in usual sterile fashion, and infiltrated locally with 1% lidocaine. A 5 French  multisidehole Yueh sheath needle was advanced into the peritoneal space until fluid could be aspirated. The sheath was advanced and the needle removed. 1.2 L of cloudy yellow ascites were aspirated. A sample sent to cytology for the requested studies.  COMPLICATIONS: COMPLICATIONS none  IMPRESSION: Technically successful ultrasound guided paracentesis, removing 1.2 L of ascites. Sample to cytology.   Electronically Signed   By: Lucrezia Europe M.D.   On: 07/17/2015 16:17   Ir Fluoro Guide Cv Line Right  07/27/2015   CLINICAL DATA:  New diagnosis of Burkitt's lymphoma. The patient requires a porta cath for chemotherapy.  EXAM: IMPLANTED PORT A CATH PLACEMENT WITH ULTRASOUND AND FLUOROSCOPIC GUIDANCE  ANESTHESIA/SEDATION: 6.0 Mg IV Versed; 150 mcg IV Fentanyl  Total Moderate Sedation Time:  30 minutes  Additional Medications: 2 g IV Ancef. As antibiotic prophylaxis, Ancef was ordered pre-procedure and administered intravenously within one hour of incision.  FLUOROSCOPY TIME:  24 seconds.  PROCEDURE: The procedure, risks, benefits, and alternatives were explained to the patient. Questions regarding the procedure were encouraged and answered. The patient understands and consents to the procedure. A time-out was performed prior to the procedure.  Ultrasound was used to confirm patency of the right internal jugular vein. The right neck and chest were prepped with chlorhexidine in a sterile fashion, and a sterile drape was applied covering the operative field. Maximum barrier sterile technique with sterile gowns and gloves were used for the procedure. Local anesthesia was provided with 1% lidocaine.  After creating a small venotomy incision, a 21 gauge needle was advanced into the right internal jugular vein under direct, real-time ultrasound guidance. Ultrasound image documentation was performed. After securing guidewire access, an 8 Fr dilator was placed. A J-wire was kinked to measure appropriate catheter length.  A  subcutaneous port pocket was then created along the upper chest wall utilizing sharp and blunt dissection. Portable  cautery was utilized. The pocket was irrigated with sterile saline.  A single lumen power injectable port was chosen for placement. The 8 Fr catheter was tunneled from the port pocket site to the venotomy incision. The port was placed in the pocket. External catheter was trimmed to appropriate length based on guidewire measurement.  At the venotomy, an 8 Fr peel-away sheath was placed over a guidewire. The catheter was then placed through the sheath and the sheath removed. Final catheter positioning was confirmed and documented with a fluoroscopic spot image. The port was accessed with a needle and aspirated and flushed with heparinized saline. The needle was left in place for immediate use.  The venotomy and port pocket incisions were closed with subcutaneous 3-0 Monocryl and subcuticular 4-0 Vicryl. Dermabond was applied to both incisions.  COMPLICATIONS: None  FINDINGS: After catheter placement, the tip lies at the cavoatrial junction. The catheter aspirates normally and is ready for immediate use.  IMPRESSION: Placement of single lumen port a cath via right internal jugular vein. The catheter tip lies at the cavoatrial junction. A power injectable port a cath was placed and is ready for immediate use.   Electronically Signed   By: Aletta Edouard M.D.   On: 07/27/2015 15:28   Ir US Guide Vasc Access Right  07/27/2015   CLINICAL DATA:  New diagnosis of Burkitt's lymphoma. The patient requires a porta cath for chemotherapy.  EXAM: IMPLANTED PORT A CATH PLACEMENT WITH ULTRASOUND AND FLUOROSCOPIC GUIDANCE  ANESTHESIA/SEDATION: 6.0 Mg IV Versed; 150 mcg IV Fentanyl  Total Moderate Sedation Time:  30 minutes  Additional Medications: 2 g IV Ancef. As antibiotic prophylaxis, Ancef was ordered pre-procedure and administered intravenously within one hour of incision.  FLUOROSCOPY TIME:  24 seconds.   PROCEDURE: The procedure, risks, benefits, and alternatives were explained to the patient. Questions regarding the procedure were encouraged and answered. The patient understands and consents to the procedure. A time-out was performed prior to the procedure.  Ultrasound was used to confirm patency of the right internal jugular vein. The right neck and chest were prepped with chlorhexidine in a sterile fashion, and a sterile drape was applied covering the operative field. Maximum barrier sterile technique with sterile gowns and gloves were used for the procedure. Local anesthesia was provided with 1% lidocaine.  After creating a small venotomy incision, a 21 gauge needle was advanced into the right internal jugular vein under direct, real-time ultrasound guidance. Ultrasound image documentation was performed. After securing guidewire access, an 8 Fr dilator was placed. A J-wire was kinked to measure appropriate catheter length.  A subcutaneous port pocket was then created along the upper chest wall utilizing sharp and blunt dissection. Portable cautery was utilized. The pocket was irrigated with sterile saline.  A single lumen power injectable port was chosen for placement. The 8 Fr catheter was tunneled from the port pocket site to the venotomy incision. The port was placed in the pocket. External catheter was trimmed to appropriate length based on guidewire measurement.  At the venotomy, an 8 Fr peel-away sheath was placed over a guidewire. The catheter was then placed through the sheath and the sheath removed. Final catheter positioning was confirmed and documented with a fluoroscopic spot image. The port was accessed with a needle and aspirated and flushed with heparinized saline. The needle was left in place for immediate use.  The venotomy and port pocket incisions were closed with subcutaneous 3-0 Monocryl and subcuticular 4-0 Vicryl. Dermabond was applied to both  incisions.  COMPLICATIONS: None  FINDINGS:  After catheter placement, the tip lies at the cavoatrial junction. The catheter aspirates normally and is ready for immediate use.  IMPRESSION: Placement of single lumen port a cath via right internal jugular vein. The catheter tip lies at the cavoatrial junction. A power injectable port a cath was placed and is ready for immediate use.   Electronically Signed   By: Aletta Edouard M.D.   On: 07/27/2015 15:28   Ct Biopsy  07/24/2015   CLINICAL DATA:  39 year old male with a new diagnosis of lymphoma.  EXAM: CT BIOPSY  Date: 07/24/2015  PROCEDURE: 1. CT guided bone marrow aspiration and core biopsy Interventional Radiologist:  Criselda Peaches, MD  ANESTHESIA/SEDATION: Moderate (conscious) sedation was used. 2.5 mg Versed, 100 mcg Fentanyl were administered intravenously. The patient's vital signs were monitored continuously by radiology nursing throughout the procedure.  Sedation Time: 9 minutes  FLUOROSCOPY TIME:  None  TECHNIQUE: Informed consent was obtained from the patient following explanation of the procedure, risks, benefits and alternatives. The patient understands, agrees and consents for the procedure. All questions were addressed. A time out was performed.  The patient was positioned prone and noncontrast localization CT was performed of the pelvis to demonstrate the iliac marrow spaces.  Maximal barrier sterile technique utilized including caps, mask, sterile gowns, sterile gloves, large sterile drape, hand hygiene, and betadine prep.  Under sterile conditions and local anesthesia, an 11 gauge coaxial bone biopsy needle was advanced into the right iliac marrow space. Needle position was confirmed with CT imaging. Initially, bone marrow aspiration was performed. Next, the 11 gauge outer cannula was utilized to obtain a right iliac bone marrow core biopsy. Needle was removed. Hemostasis was obtained with compression. The patient tolerated the procedure well. Samples were prepared with the  cytotechnologist. No immediate complications.  IMPRESSION: CT guided right iliac bone marrow aspiration and core biopsy. The On-Control drill was used.  Signed,  Criselda Peaches, MD  [facility]  [facility]   Electronically Signed   By: Jacqulynn Cadet M.D.   On: 07/24/2015 12:32   Dg Chest Port 1 View  07/22/2015   CLINICAL DATA:  Pulmonary edema, hypoxia  EXAM: PORTABLE CHEST - 1 VIEW  COMPARISON:  07/21/2015  FINDINGS: Heart size is normal. Right-sided PICC line in place with tip over the cavoatrial junction. Hazy perihilar rate greater than left basilar airspace opacities are slightly decreased. Trace pleural effusions.  IMPRESSION: Interval slight decrease in hazy perihilar airspace opacities compatible with improving edema.   Electronically Signed   By: Conchita Paris M.D.   On: 07/22/2015 08:10   Dg Chest Port 1 View  07/21/2015   CLINICAL DATA:  Acute respiratory failure with hypoxemia. Nonsmoker.  EXAM: PORTABLE CHEST - 1 VIEW  COMPARISON:  07/20/2015.  FINDINGS: Cardiomediastinal silhouette appears within normal limits for size. Diffuse pulmonary opacities persist but are slightly improved consistent with resolving edema. Decreased BILATERAL pleural effusions. PICC line tip SVC. No pneumothorax.  IMPRESSION: Improving pulmonary edema.   Electronically Signed   By: Staci Righter M.D.   On: 07/21/2015 09:38   Dg Chest Port 1 View  07/20/2015   CLINICAL DATA:  Shortness of Breath  EXAM: PORTABLE CHEST - 1 VIEW  COMPARISON:  July 15, 2015  FINDINGS: There is alveolar edema throughout both lungs. There are small bilateral effusions. Heart is upper normal in size. There is mild pulmonary venous hypertension. No adenopathy.  IMPRESSION: Extensive  edema. Question a degree of congestive heart failure versus noncardiogenic edema. Note that cardiac silhouette is unchanged compared to recent prior study. No adenopathy apparent.   Electronically Signed    By: Lowella Grip III M.D.   On: 07/20/2015 15:36   Dg Fluoro Guide Lumbar Puncture  07/24/2015   CLINICAL DATA:  Lymphoma  EXAM: DIAGNOSTIC LUMBAR PUNCTURE UNDER FLUOROSCOPIC GUIDANCE  FLUOROSCOPY TIME:  Radiation Exposure Index (as provided by the fluoroscopic device):  If the device does not provide the exposure index:  Fluoroscopy Time (in minutes and seconds):  13 seconds  Number of Acquired Images:  0  PROCEDURE: Informed consent was obtained from the patient prior to the procedure, including potential complications of headache, allergy, and pain. With the patient prone, the lower back was prepped with Betadine. 1% Lidocaine was used for local anesthesia. Lumbar puncture was performed at the L3-4 level using a 22 gauge needle with return of clear CSF with an opening pressure of 14 cm water. 12 ml of CSF were obtained for laboratory studies. The patient tolerated the procedure well and there were no apparent complications.  IMPRESSION: 1. Technically successful lumbar puncture. 12 cc of clear CSF were obtained for laboratory studies.   Electronically Signed   By: Kerby Moors M.D.   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 ? EBV related given h/o bothersome infection in 2013. PET/CT with significant intra-abdominal involvement, small intestinal involvement, some chest LNadenopathy as noted above. Bone Marrow Biopsy - no evidence of lymphoma CSF - no overt evidence of CNS involvement by lymphoma 2) Post-operative hypoxic respiratory failure with CXR showing b/l infiltrates ?Fluid overload - resolved with light diuresis. 2) Spontaneous TLS - uric acid normalized with allopurinol. Some bump in LFts now improved. Restarted on lower dose of Allopurinol. Some increase in phosphorus consistent with tumor lysis. 3) AKI ?related to  contrast/urate nephropathy less likely urinary obstruction. Creatinine has now improved from 1.54 to 1.1 to 0.9 4) Elevated transaminases - ? Related to allopurinol vs lymphomatous involvement. 5) Elevated free T4 - ? Related to his supplements vs ?thyroiditis vs paraneoplastic process. 6) Asymptomatic bradycardia - physiologic to a great extent and with sedation.Plan Doubt acute anthracycline toxicity. conitnue to monitor. If hypotensive or symptomatic will need telemetry. Remains stable today. 7) Mild taste change/early mucositis Plan  -continue current dose of allopurinol (will continue atleast till 2nd cycle if tolerated). -continue IVF with bicarb for TLS protection till tomorrow. -prn lasix if signs of fluid overload. -continue EPOCH-R started 07/24/2014 days 5 today..will get cyclophosphamide today. -we decided to do first dose Rituxan first thing tomorrow AM instead of today to allow for closer monitoring. -will need neulasta as outpatient. (on 07/31/2015) -clinic followup on 08/03/2015 -cbc, phosphorus, magnesium daily, cmp and uric acid twice daily -Rasburicase 6m IVPB daily prn for uric acid >10 (has not  Needed this) -IT Methotrexate for CNS prophylaxis Day 1 and Day 5 from cycle 2 or 3 (Started in cycle 3 in the NEJM study) -rpt PET/CT after 2 cycles. -discussed exercise recommendation and infection protection recommendations in details with patient.  -Will need anti-nausea medications/pain meds/bowel prophylaxis/NACL-NAHCO3 mouthwash/magic mouthwash on discharge.  Will continue to follow daily. Likely discharge tomorrow after completion of Rituxan.  Appreciate cares by hospitalist team. Kindly call if questions.   Donald  Irene Limbo MD [place] AAHIVMS [facility] Hematology/Oncology Physician [facility]  (Office):       [phone] (Work cell):  [phone] (Fax):           [phone]  07/29/2015

## 2015-07-29 NOTE — Progress Notes (Signed)
[facility] PROGRESS NOTE  Wiliam Cauthorn CXK:[id number] DOB: 01/19/1976 DOA: 07/14/2015 PCP: Drema Pry, DO  brief narrative 39 year old male with no medical history presented with 2 weeks of back pain while riding his bike. He had a chest x-ray done as outpatient which suggested a possible infection and was given antibiotic. However patient returned to the ED as he had right chest pain and was short of breath. In the ED was found to have a large right-sided pleural effusion and was admitted for further workup. Patient transferred to [facility] long for excisional core biopsy. Patient was started on Chemotherapy.   Assessment/Plan: Burkitt B cell Lymphoma:  Pleural effusion, right/abdominal mass suggestive of B cell lymphoma: -CT scan of the chest with contrast: -  significant effusion with compression of the right lung. -CT scan of the abdomen :- peritoneal carcinomatosis with no lymphadenopathy -Right  Thoracocentesis suggests exudate. -oncology following. Started on decadron on 9/10 after laparoscopic excisional biopsy on 9/9. PET scan  9/12. -PICC line placed on 9/9 -[place] core biopsy of omental mass and diagnostic/therapeutic paracentesis,. tissue flow cytometry suggests proliferative B cell.  -plan to start inpatient treatment. -SP LP and BM biopsy 9-13. No malignant cell CSF/  -Started on chemo. Monitor for adverse reaction.  -Continue with IV bicarb gtt.  Hopefully home Monday will follow oncologist recommendation.   Postop Acute Hypoxemic Respiratory Failure on 9/9 Patient extubated and placed on nonrebreather. Given 10 mg IV Lasix. Lactic acid mildly elevated. Chest x-ray shows extensive pulmonary edema. Discontinued IV fluids. Received 2 doses of IV lasix ( total 40 mg on 9/10). Currently maintaining sats on 2.5 L via nasal cannula.  Continue incentive spirometry. Repeat chest x-ray showed improving pulmonary edema. Received one time dose of lasix 9-11--9-12.  Weight at 90 Kg. Hold  on lasix doses. Follow clinically.  Monitor on chemo. If respiratory distress might need lasix, chest x ray.   Bradycardia, asymptomatic.  Denies lightheaded, ro dyspnea.  EKG sinus bradycardia. HR has increased.   Acute kidney injury -possibly due to tumor lysis syndrome and IV contrast -Renal function back to normal.  Constipation; Miralax, lactulose PRN.   Tumor lysis syndrome: -on  allopurinol  -Renal function improving and uric acid normal.  -phosphorus has decreased. Continue with iv Fluids.   Hypokalemia Resolved.   GERD Added PPI twice a day and Maalox. Symptoms improved.  Elevated TSH and free T4 No clear etiology.? Thyroiditis versus secondary hyper thyroidism. Follow thyroglobulin antibody and stimulating immunoglobulin.  Screening process: -non reactive HIV -CMV IGM and antibody neg -EBV results (DNA by PCR and VCA antibody) negative.  -ESR 6 -CRP 4.3 -hepatitis panel neg -AFP 4.5 -HCG tumor marker < 1     DVT prophylaxis: sq heparin  Diet: Regular   Consultants:  [facility] Oncology ( Dr Irene Limbo) [facility]  Procedures:  Thoracocentesis  Paracentesis  laproscopy abdominal biopsy  Antibiotics:  None  CODE STATUS: Full code Family communication: None at bedside today.  Disposition: start chemotherapy  HPI/Subjective: Feeling ok, denies dyspnea, had small BM.  Will walk later.    Objective: Filed Vitals:   07/29/15 1333  BP: 136/81  Pulse: 68  Temp: 98.3 F (36.8 C)  Resp: 16    Intake/Output Summary (Last 24 hours) at 07/29/15 1415 Last data filed at 07/29/15 1330  Gross per 24 hour  Intake 1161.33 ml  Output   1350 ml  Net -188.67 ml   Filed Weights   07/26/15 0700 07/27/15 0505 07/28/15 0539  Weight: 88.769  kg (195 lb 11.2 oz) 88.089 kg (194 lb 3.2 oz) 88.905 kg (196 lb)    Exam:   General:  no acute distress  Chest: CTA  CVS: Normal S1 and S2, no murmurs  GI: Soft, abdominal distention, nontender, bowel  sounds present  Musculoskeletal: Warm, no edema  CNS: Alert and oriented  Data Reviewed: Basic Metabolic Panel:  Recent Labs Lab 07/25/15 0550  07/26/15 0500  07/27/15 0437 07/27/15 1735 07/28/15 0545 07/28/15 1610 07/29/15 0635  NA  --   < > 138  < > 138 137 138 136 138  K  --   < > 4.2  < > 3.9 3.8 4.0 3.9 3.6  CL  --   < > 104  < > 107 106 106 104 105  CO2  --   < > 25  < > 24 24 26 25 26  GLUCOSE  --   < > 135*  < > 137* 145* 115* 123* 89  BUN  --   < > 26*  < > 25* 25* 23* 22* 21*  CREATININE  --   < > 0.84  < > 0.87 0.92 0.78 0.83 0.74  CALCIUM  --   < > 8.0*  < > 8.3* 8.1* 8.3* 8.3* 8.3*  MG 1.8  --  2.3  --  2.2  --  1.9  --  1.9  PHOS 3.6  --  6.8*  --  4.7*  --  4.2  --  3.4  < > = values in this interval not displayed. Liver Function Tests:  Recent Labs Lab 07/27/15 0437 07/27/15 1735 07/28/15 0545 07/28/15 1610 07/29/15 0635  AST 41 119* 63* 66* 51*  ALT 82* 153* 125* 134* 108*  ALKPHOS 48 52 43 49 45  BILITOT 0.6 0.5 0.7 0.8 0.9  PROT 5.1* 5.4* 5.0* 5.5* 5.0*  ALBUMIN 2.8* 2.8* 2.6* 3.0* 2.7*   No results for input(s): LIPASE, AMYLASE in the last 168 hours. No results for input(s): AMMONIA in the last 168 hours. CBC:  Recent Labs Lab 07/25/15 0550 07/26/15 0500 07/27/15 0437 07/28/15 0545 07/29/15 0635  WBC 9.3 13.4* 15.1* 8.8 8.0  NEUTROABS 5.7 11.1* 12.7* 7.4 5.6  HGB 12.9* 12.9* 12.1* 11.8* 12.2*  HCT 38.6* 37.3* 35.3* 34.8* 35.6*  MCV 86.5 84.4 84.7 85.5 84.8  PLT 235 252 248 253 262   Cardiac Enzymes: No results for input(s): CKTOTAL, CKMB, CKMBINDEX, TROPONINI in the last 168 hours. BNP (last 3 results)  Recent Labs  07/20/15 1646  BNP 13.9    ProBNP (last 3 results) No results for input(s): PROBNP in the last 8760 hours.  CBG:  Recent Labs Lab 07/23/15 0908  GLUCAP 88    Recent Results (from the past 240 hour(s))  Surgical pcr screen     Status: None   Collection Time: 07/19/15 10:12 PM  Result Value Ref Range  Status   MRSA, PCR NEGATIVE NEGATIVE Final   Staphylococcus aureus NEGATIVE NEGATIVE Final    Comment:        The Xpert SA Assay (FDA approved for NASAL specimens in patients over 21 years of age), is one component of a comprehensive surveillance program.  Test performance has been validated by [facility] for patients greater than or equal to 1 year old. It is not intended to diagnose infection nor to guide or monitor treatment.   CSF culture     Status: None   Collection Time: 07/24/15 11:46 AM  Result Value Ref   Range Status   Specimen Description CSF  Final   Special Requests NONE  Final   Gram Stain   Final    WBC PRESENT, PREDOMINANTLY MONONUCLEAR NO ORGANISMS SEEN CYTOSPIN Gram Stain Report Called to,Read Back By and Verified With: BERGER,J. RN @1449 ON 9.13.16 BY MCCOY,N.    Culture   Final    NO GROWTH 3 DAYS Performed at [facility]    Report Status 07/27/2015 FINAL  Final     Studies: No results found.  Scheduled Meds: . [START ON 07/30/2015] acetaminophen  650 mg Oral Once  . allopurinol  150 mg Oral BID  . cyclophosphamide  750 mg/m2 (Treatment Plan Actual) Intravenous Once  . [START ON 07/30/2015] diphenhydrAMINE  50 mg Oral Once  . DOXOrubicin/vinCRIStine/etoposide CHEMO IV infusion for Inpatient CI   Intravenous Once  . enoxaparin (LOVENOX) injection  40 mg Subcutaneous Q24H  . pantoprazole  40 mg Oral BID AC  . polyethylene glycol  17 g Oral BID  . [START ON 07/30/2015] riTUXimab (RITUXAN) IV infusion  375 mg/m2 (Treatment Plan Actual) Intravenous Once  . senna-docusate  2 tablet Oral BID  . sodium bicarbonate/sodium chloride   Mouth Rinse QID  . sodium chloride  10-40 mL Intracatheter Q12H   Continuous Infusions: . sodium chloride 10 mL/hr at 07/15/15 1023  . sodium chloride 10 mL/hr at 07/25/15 1056  .  sodium bicarbonate  infusion 1000 mL 75 mL/hr at 07/29/15 0741     Time spent: 25 minutes    Regalado, Belkys A  [facility] Pager [phone] If 7PM-7AM, please contact night-coverage at [email or web address], password [email or web address] 07/29/2015, 2:15 PM  LOS: 15 days             

## 2015-07-30 ENCOUNTER — Other Ambulatory Visit: Payer: Self-pay | Admitting: Hematology

## 2015-07-30 DIAGNOSIS — C8370 Burkitt lymphoma, unspecified site: Secondary | ICD-10-CM

## 2015-07-30 LAB — COMPREHENSIVE METABOLIC PANEL
ALBUMIN: 2.8 g/dL — AB (ref 3.5–5.0)
ALK PHOS: 46 U/L (ref 38–126)
ALT: 157 U/L — ABNORMAL HIGH (ref 17–63)
ANION GAP: 8 (ref 5–15)
AST: 74 U/L — ABNORMAL HIGH (ref 15–41)
BILIRUBIN TOTAL: 1 mg/dL (ref 0.3–1.2)
BUN: 22 mg/dL — ABNORMAL HIGH (ref 6–20)
CALCIUM: 8.6 mg/dL — AB (ref 8.9–10.3)
CO2: 28 mmol/L (ref 22–32)
Chloride: 102 mmol/L (ref 101–111)
Creatinine, Ser: 0.73 mg/dL (ref 0.61–1.24)
GFR calc Af Amer: >60 mL/min (ref >60)
GFR calc non Af Amer: >60 mL/min (ref >60)
GLUCOSE: 94 mg/dL (ref 65–99)
Potassium: 3.6 mmol/L (ref 3.5–5.1)
Sodium: 138 mmol/L (ref 135–145)
TOTAL PROTEIN: 5.1 g/dL — AB (ref 6.5–8.1)

## 2015-07-30 LAB — CBC WITH DIFFERENTIAL/PLATELET
Basophils Absolute: 0 10*3/uL (ref 0.0–0.1)
Basophils Relative: 0 %
EOS ABS: 0 10*3/uL (ref 0.0–0.7)
EOS PCT: 0 %
HCT: 34.3 % — ABNORMAL LOW (ref 39.0–52.0)
Hemoglobin: 12 g/dL — ABNORMAL LOW (ref 13.0–17.0)
LYMPHS ABS: 1.8 10*3/uL (ref 0.7–4.0)
Lymphocytes Relative: 27 %
MCH: 29.3 pg (ref 26.0–34.0)
MCHC: 35 g/dL (ref 30.0–36.0)
MCV: 83.9 fL (ref 78.0–100.0)
MONO ABS: 0.2 10*3/uL (ref 0.1–1.0)
MONOS PCT: 3 %
Neutro Abs: 4.9 10*3/uL (ref 1.7–7.7)
Neutrophils Relative %: 70 %
PLATELETS: 258 10*3/uL (ref 150–400)
RBC: 4.09 MIL/uL — ABNORMAL LOW (ref 4.22–5.81)
RDW: 12.3 % (ref 11.5–15.5)
WBC: 6.9 10*3/uL (ref 4.0–10.5)

## 2015-07-30 LAB — LACTATE DEHYDROGENASE: LDH: 345 U/L — ABNORMAL HIGH (ref 98–192)

## 2015-07-30 LAB — URIC ACID: Uric Acid, Serum: 3.6 mg/dL — ABNORMAL LOW (ref 4.4–7.6)

## 2015-07-30 MED ORDER — PANTOPRAZOLE SODIUM 40 MG PO TBEC: 40.0000 mg | DELAYED_RELEASE_TABLET | Freq: Two times a day (BID) | ORAL | Status: DC

## 2015-07-30 MED ORDER — LACTULOSE 10 GM/15ML PO SOLN: 20.0000 g | Freq: Two times a day (BID) | ORAL | Status: DC | PRN

## 2015-07-30 MED ORDER — HYDROCODONE-ACETAMINOPHEN 5-325 MG PO TABS: 1.0000 | ORAL_TABLET | ORAL | Status: DC | PRN

## 2015-07-30 MED ORDER — POLYETHYLENE GLYCOL 3350 17 G PO PACK: 17.0000 g | PACK | Freq: Two times a day (BID) | ORAL | Status: DC

## 2015-07-30 MED ORDER — SODIUM BICARBONATE/SODIUM CHLORIDE MOUTHWASH: 1.0000 "application " | Freq: Four times a day (QID) | OROMUCOSAL | Status: DC

## 2015-07-30 MED ORDER — OXYCODONE HCL 5 MG PO CAPS: 5.0000 mg | ORAL_CAPSULE | Freq: Four times a day (QID) | ORAL | Status: DC | PRN

## 2015-07-30 MED ORDER — OXYCODONE HCL 5 MG PO CAPS: 5.0000 mg | ORAL_CAPSULE | ORAL | Status: DC | PRN

## 2015-07-30 MED ORDER — ALLOPURINOL 300 MG PO TABS: 150.0000 mg | ORAL_TABLET | Freq: Two times a day (BID) | ORAL | Status: DC

## 2015-07-30 NOTE — Progress Notes (Signed)
Pt states his chest feels tight and that we are "killing him with the IV fluids" and he wants the IVF rate turned down. He doesn't have any wheezing or crackles, just complains of tightness in his chest. He said he didn't feel like it was to the point that he needed lasix again, but he does want the fluids reduced or stopped. I explained the importance of getting IVF right now, to manage the effects of TLS and also protect his bladder after receiving cyclophosphamide. He still wants the rate reduced. Will draw lab work shortly and page MD for recommendations. Hortencia Conradi RN

## 2015-07-30 NOTE — Progress Notes (Signed)
rituximab dosage calculation verified with Wilkie Aye RN.

## 2015-07-30 NOTE — Progress Notes (Signed)
.   HEMATOLOGY/ONCOLOGY INPATIENT PROGRESS NOTE  Date of Service: 07/30/2015  Inpatient Attending: .Belkys A Regalado, MD   SUBJECTIVE  Patient was seen this afternoon. Completed his Rituxan uneventfully. No nausea or vomiting. LDH levels down to 300s. No headaches. Had a good bowel movement and notes of his abdomen feels much better. Keen to go home today.  OBJECTIVE:  PHYSICAL EXAMINATION: . Filed Vitals:   07/30/15 1116 07/30/15 1148 07/30/15 1220 07/30/15 1311  BP: 123/67 126/59 119/64 124/72  Pulse: 52 64 62 58  Temp: 98.6 F (37 C) 98.1 F (36.7 C) 98.6 F (37 C) 98.8 F (37.1 C)  TempSrc: Oral Oral Oral Oral  Resp: 16 16 16 16  Height:      Weight:      SpO2: 97% 97% 98% 97%   Filed Weights   07/27/15 0505 07/28/15 0539 07/30/15 0447  Weight: 194 lb 3.2 oz (88.089 kg) 196 lb (88.905 kg) 190 lb 14.7 oz (86.6 kg)   .Body mass index is 26.64 kg/(m^2).  GENERAL: SKIN:no acute rashes. EYES: normal, conjunctiva are pink and non-injected, sclera clear OROPHARYNX:no exudate, no erythema and lips, buccal mucosa, and tongue normal  NECK: supple, no JVD, thyroid normal size, non-tender, without nodularity LYMPH:  no palpable lymphadenopathy in the cervical, axillary or inguinal LUNGS: CTA bilaterally HEART: regular rate & rhythm,  no murmurs and trace lower extremity edema ABDOMEN: abdomen mildly distended (improved), BS normoactive, laparoscopic incisions clean and healing well. PSYCH: alert & oriented x 3 with fluent speech NEURO: no focal motor/sensory deficits  MEDICAL HISTORY:  Past Medical History  Diagnosis Date  . EBV infection 2013    Patient notes that he had an EBV infection in 2013 that left him with chronic fatigue. She also notes that he had significant lymphadenopathy and thyroiditis at the time. He has been managing his symptoms with an alternative medicine practitioner in [place] and with other alternative medicines.  . Marijuana use, episodic      SURGICAL HISTORY: Past Surgical History  Procedure Laterality Date  . Tonsillectomy    . Appendectomy    . Laparoscopy N/A 07/20/2015    Procedure: LAPAROSCOPY DIAGNOSTIC, MULTIPLE BIOPSIES, DRAINAGE OF ASCITES;  Surgeon: Haywood Ingram, MD;  Location: [facility];  Service: General;  Laterality: N/A;    SOCIAL HISTORY: Social History   Social History  . Marital Status: Married    Spouse Name: N/A  . Number of Children: N/A  . Years of Education: N/A   Occupational History  . Not on file.   Social History Main Topics  . Smoking status: Never Smoker   . Smokeless tobacco: Not on file  . Alcohol Use: No  . Drug Use: 1.00 per week    Special: Marijuana  . Sexual Activity: Not on file   Other Topics Concern  . 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   He has a fiance and has been in a steady relationship.      He has tried to maintain a very healthy lifestyle and cycles about 100 miles a week. He also uses a fair number of over-the-counter alternative medicines to stay healthy.    FAMILY HISTORY: Family History  Problem Relation Age of Onset  . Heart failure Father     ALLERGIES:  is allergic to fluoride preparations and sulfa antibiotics.  MEDICATIONS:  Scheduled Meds: . allopurinol  150 mg   Oral BID  . enoxaparin (LOVENOX) injection  40 mg Subcutaneous Q24H  . pantoprazole  40 mg Oral BID AC  . polyethylene glycol  17 g Oral BID  . senna-docusate  2 tablet Oral BID  . sodium bicarbonate/sodium chloride   Mouth Rinse QID  . sodium chloride  10-40 mL Intracatheter Q12H   Continuous Infusions: . sodium chloride 10 mL/hr at 07/15/15 1023  . sodium chloride 10 mL/hr at 07/25/15 1056  .  sodium bicarbonate  infusion 1000 mL 75 mL/hr at 07/29/15 2121   PRN Meds:.sodium chloride, albuterol, alteplase, alteplase, Cold Pack, diphenhydrAMINE, diphenhydrAMINE, EPINEPHrine,  EPINEPHrine, EPINEPHrine, EPINEPHrine, famotidine, fludeoxyglucose F - 18, gi cocktail, heparin lock flush, heparin lock flush, heparin lock flush, heparin lock flush, Hot Pack, HYDROcodone-acetaminophen, lactulose, LORazepam, magnesium citrate, methylPREDNISolone sodium succinate, morphine injection, ondansetron (ZOFRAN) IV, rasburicase (ELITEK) IV infusion, sodium chloride, sodium chloride, sodium chloride, sodium chloride, sodium chloride  REVIEW OF SYSTEMS:    10 Point review of Systems was done is negative except as noted above.   LABORATORY DATA:  I have reviewed the data as listed  . CBC Latest Ref Rng 07/30/2015 07/29/2015 07/28/2015  WBC 4.0 - 10.5 K/uL 6.9 8.0 8.8  Hemoglobin 13.0 - 17.0 g/dL 12.0(L) 12.2(L) 11.8(L)  Hematocrit 39.0 - 52.0 % 34.3(L) 35.6(L) 34.8(L)  Platelets 150 - 400 K/uL 258 262 253   . CBC    Component Value Date/Time   WBC 6.9 07/30/2015 0410   RBC 4.09* 07/30/2015 0410   RBC 5.39 07/18/2015 0920   HGB 12.0* 07/30/2015 0410   HCT 34.3* 07/30/2015 0410   PLT 258 07/30/2015 0410   MCV 83.9 07/30/2015 0410   MCH 29.3 07/30/2015 0410   MCHC 35.0 07/30/2015 0410   RDW 12.3 07/30/2015 0410   LYMPHSABS 1.8 07/30/2015 0410   MONOABS 0.2 07/30/2015 0410   EOSABS 0.0 07/30/2015 0410   BASOSABS 0.0 07/30/2015 0410      . CMP Latest Ref Rng 07/30/2015 07/29/2015 07/29/2015  Glucose 65 - 99 mg/dL 94 141(H) 89  BUN 6 - 20 mg/dL 22(H) 22(H) 21(H)  Creatinine 0.61 - 1.24 mg/dL 0.73 0.83 0.74  Sodium 135 - 145 mmol/L 138 135 138  Potassium 3.5 - 5.1 mmol/L 3.6 3.8 3.6  Chloride 101 - 111 mmol/L 102 102 105  CO2 22 - 32 mmol/L _0 Calcium 8.9 - 10.3 mg/dL 8.6(L) 8.3(L) 8.3(L)  Total Protein 6.5 - 8.1 g/dL 5.1(L) 5.8(L) 5.0(L)  Total Bilirubin 0.3 - 1.2 mg/dL 1.0 0.8 0.9  Alkaline Phos 38 - 126 U/L 46 58 45  AST 15 - 41 U/L 74(H) 136(H) 51(H)  ALT 17 - 63 U/L 157(H) 204(H) 108(H)   ASSESSMENT & PLAN:   Patient is a 39 yo male admitted with   1)  High grade Non Hodgkins B-cell lymphoma consistent with Burkitts lymphoma as per pathology report with spontaneous TLS. ? EBV related given h/o bothersome infection in 2013. PET/CT with significant intra-abdominal involvement, small intestinal involvement, some chest LNadenopathy as noted above. Bone Marrow Biopsy - no evidence of lymphoma CSF - no overt evidence of CNS involvement by lymphoma. cytology negative for lymphoma.  2) Post-operative hypoxic respiratory failure with CXR showing b/l infiltrates ?Fluid overload - resolved with light diuresis. 2) Spontaneous TLS - uric acid normalized with allopurinol. Some bump in LFts now improved. Restarted on lower dose of Allopurinol. Some increase in phosphorus consistent with tumor lysis. 3) AKI ?related to contrast/urate nephropathy less likely urinary obstruction. Creatinine  has now improved from 1.54 to 1.1 to 0.9 4) Elevated transaminases - ? Related to allopurinol vs lymphomatous involvement. 5) Elevated free T4 - ? Related to his supplements vs ?thyroiditis vs paraneoplastic process. 6) Asymptomatic bradycardia - physiologic to a great extent and with sedation.Plan Doubt acute anthracycline toxicity. conitnue to monitor. If hypotensive or symptomatic will need telemetry. Remains stable today. 7) Mild taste change/early mucositis Plan  -continue current dose of allopurinol. will likely discontinue on clinic follow-up on 08/03/2015 a tumor lysis stable. -Completed first cycle of EPOCH-R started 07/24/2014 to 07/30/2015 uneventfully. -Follow-up in infusion Center tomorrow for Neulasta  -IT Methotrexate for CNS prophylaxis Day 1 and Day 5 from cycle 2 or 3 (Started in cycle 3 in the NEJM study) -rpt PET/CT after 2 cycles. -discussed exercise recommendation and infection protection recommendations in details with patient. -Follow-up in clinic with Dr Irene Limbo on 08/03/2015 with repeat labs.  -Will need anti-nausea medications/pain meds/bowel  prophylaxis/NACL-NAHCO3 mouthwash/magic mouthwash on discharge.  Appreciate cares by hospitalist team. Kindly call if questions.   Sullivan Lone MD [place] AAHIVMS [facility] Hematology/Oncology Physician [facility]  (Office):       [phone] (Work cell):  [phone] (Fax):           [phone]  07/30/2015

## 2015-07-30 NOTE — Progress Notes (Signed)
Pts chest tightness feels better after taking ativan, he recognizes it may have been related to anxiety and not the IVF. He is agreeable to keep fluids at the prescribed rate for now and will let me know if he feels worse. Hortencia Conradi RN

## 2015-07-30 NOTE — Progress Notes (Signed)
Rituximab infusion increased to 100mg /hr. Patient tolerating infusion well, no s/s of infection. Will continue to monitor.

## 2015-07-30 NOTE — Discharge Summary (Signed)
Physician Discharge Summary  Donald [name] QJJ:[id number] DOB: 1976/11/06 DOA: 07/14/2015  PCP: Drema Pry, DO  Admit date: 07/14/2015 Discharge date: 07/30/2015  Time spent: 35 minutes  Recommendations for Outpatient Follow-up:  Needs CBC , Bmet to follow counts after chemo.  Follow up with Dr Irene Limbo for further chemotherapy./   Discharge Diagnoses:    Burkitt's lymphoma   Pleural effusion, right   Acute respiratory failure with hypoxemia   Spontaneous tumor lysis syndrome   Abdominal distension   AKI (acute kidney injury)   Ascites   ARF (acute renal failure)   Elevated LFTs   Lymphoma   Acute respiratory failure with hypoxia    CN (constipation)   Discharge Condition: stable  Diet recommendation: regular diet.   Filed Weights   07/27/15 0505 07/28/15 0539 07/30/15 0447  Weight: 88.089 kg (194 lb 3.2 oz) 88.905 kg (196 lb) 86.6 kg (190 lb 14.7 oz)    History of present illness:  Patient is a 39 year old male with no significant past medical history who was in his usual state of health until 2 weeks ago when he started having back pain especially when riding his bike. He got a CXR last week that suggested a possible infection so he was given antibiotics. But last night he could not sleep due to pain and tingling sensation in right chest. He was a little short of breath on exertion but had no chest pain. He denied fever/chills. He had profuse sweating over the last few nights. He had no weight loss or loss of appetite.   Hospital Course:  brief narrative 39 year old male with no medical history presented with 2 weeks of back pain while riding his bike. He had a chest x-ray done as outpatient which suggested a possible infection and was given antibiotic. However patient returned to the ED as he had right chest pain and was short of breath. In the ED was found to have a large right-sided pleural effusion and was admitted for further workup. Patient transferred to [facility] long  for excisional core biopsy. Patient was started on Chemotherapy.   Assessment/Plan: Burkitt B cell Lymphoma:  Pleural effusion, right/abdominal mass suggestive of B cell lymphoma: -CT scan of the chest with contrast: - significant effusion with compression of the right lung. -CT scan of the abdomen :- peritoneal carcinomatosis with no lymphadenopathy -Right Thoracocentesis suggests exudate. -oncology following. Started on decadron on 9/10 after laparoscopic excisional biopsy on 9/9. PET scan 9/12. -PICC line placed on 9/9 -[place] core biopsy of omental mass and diagnostic/therapeutic paracentesis,. tissue flow cytometry suggests proliferative B cell.  -plan to start inpatient treatment. -SP LP and BM biopsy 9-13. No malignant cell CSF/  -Started on chemo. Monitor for adverse reaction.  -received Bicarb Gtt.  -stable , home today after he finished chemotherapy/   Postop Acute Hypoxemic Respiratory Failure on 9/9 Patient extubated and placed on nonrebreather. Given 10 mg IV Lasix. Lactic acid mildly elevated. Chest x-ray shows extensive pulmonary edema. Discontinued IV fluids. Received 2 doses of IV lasix ( total 40 mg on 9/10). Currently maintaining sats on 2.5 L via nasal cannula. Continue incentive spirometry. Repeat chest x-ray showed improving pulmonary edema. Received one time dose of lasix 9-11--9-12.  Weight at 90 Kg. Hold on lasix doses. Follow clinically.  Monitor on chemo.  Bradycardia, asymptomatic.  Denies lightheaded, ro dyspnea.  EKG sinus bradycardia. HR has increased.   Acute kidney injury -possibly due to tumor lysis syndrome and IV contrast -Renal function back to  normal.  Constipation; Miralax, lactulose PRN.   Tumor lysis syndrome: -on allopurinol  -Renal function improving and uric acid normal.  -phosphorus has decreased. Continue with iv Fluids.   Hypokalemia Resolved.   GERD Added PPI twice a day and Maalox. Symptoms improved.  Elevated TSH  and free T4 No clear etiology.? Thyroiditis versus secondary hyper thyroidism. Follow thyroglobulin antibody and stimulating immunoglobulin.  Screening process: -non reactive HIV -CMV IGM and antibody neg -EBV results (DNA by PCR and VCA antibody) negative.  -ESR 6 -CRP 4.3 -hepatitis panel neg -AFP 4.5 -HCG tumor marker < 1   Procedures:  Thoracocentesis  Paracentesis  laproscopy abdominal biopsy  Consultations:  Oncology   Surgery  CCM   Discharge Exam: Filed Vitals:   07/30/15 1311  BP: 124/72  Pulse: 58  Temp: 98.8 F (37.1 C)  Resp: 16    General: NAD Cardiovascular: S 1, S 2 RRR Respiratory: CTA  Discharge Instructions   Discharge Instructions    Diet - low sodium heart healthy    Complete by:  As directed      Diet general    Complete by:  As directed      Increase activity slowly    Complete by:  As directed      Increase activity slowly    Complete by:  As directed           Current Discharge Medication List    START taking these medications   Details  allopurinol (ZYLOPRIM) 300 MG tablet Take 0.5 tablets (150 mg total) by mouth 2 (two) times daily. Qty: 30 tablet, Refills: 0   Associated Diagnoses: Spontaneous tumor lysis syndrome    lactulose (CHRONULAC) 10 GM/15ML solution Take 30 mLs (20 g total) by mouth 2 (two) times daily as needed for mild constipation. Qty: 240 mL, Refills: 0    oxycodone (OXY-IR) 5 MG capsule Take 1 capsule (5 mg total) by mouth every 6 (six) hours as needed. Qty: 30 capsule, Refills: 0    pantoprazole (PROTONIX) 40 MG tablet Take 1 tablet (40 mg total) by mouth 2 (two) times daily before a meal. Qty: 30 tablet, Refills: 0    polyethylene glycol (MIRALAX / GLYCOLAX) packet Take 17 g by mouth 2 (two) times daily. Qty: 14 each, Refills: 0    sodium bicarbonate/sodium chloride SOLN 1 application by Mouth Rinse route 4 (four) times daily. Qty: 100 mL, Refills: 0   Associated Diagnoses: Mucositis due to  antineoplastic therapy      CONTINUE these medications which have NOT CHANGED   Details  Multiple Vitamin (MULTIVITAMIN) tablet Take 1 tablet by mouth daily.      STOP taking these medications     gabapentin (NEURONTIN) 300 MG capsule      azithromycin (ZITHROMAX) 250 MG tablet      levofloxacin (LEVAQUIN) 500 MG tablet        Allergies  Allergen Reactions  . Fluoride Preparations Other (See Comments)    Patient prefers not to take fluoride based antibiotics like Levaquin  . Sulfa Antibiotics Other (See Comments)    Unknown, Patient was a child and does not remember the reaction   Follow-up Information    Call Adin Hector, MD.   Specialty:  General Surgery   Why:  and make an appointment in 2 weeks after discharge.   Contact information:   [place]  [address] [phone]       Follow up with Sullivan Lone, MD.  Specialties:  Hematology, Oncology   Contact information:   [place] [address] [phone]        The results of significant diagnostics from this hospitalization (including imaging, microbiology, ancillary and laboratory) are listed below for reference.    Significant Diagnostic Studies: Dg Chest 2 View  07/15/2015   CLINICAL DATA:  Followup right pleural effusion, status post right thoracentesis.  EXAM: CHEST  2 VIEW  COMPARISON:  Yesterday.  FINDINGS: Stable right upper companion rib shadows. No pneumothorax seen. Significantly decreased right pleural fluid. The interstitial markings remain mildly prominent. Normal sized heart. Interval mild left lower lobe linear density. Unremarkable bones.  IMPRESSION: 1. No pneumothorax following right thoracentesis. 2. Significantly less right pleural fluid. 3. Interval mild left lower lobe atelectasis. 4. Stable mild chronic interstitial lung disease.   Electronically Signed   By: Claudie Revering M.D.   On: 07/15/2015 17:21   Dg Chest 2 View  07/14/2015   CLINICAL DATA:   Right-sided chest pain for 2-3 days. Shortness of breath.  EXAM: CHEST  2 VIEW  COMPARISON:  07/12/2015  FINDINGS: Heart size is probably normal. Suspect lung opacity at the right base in addition to the elevated hemidiaphragm. Opacity at the right lung base may be related to subpulmonic effusion. Overall the appearance is stable since previous exam. There is a trace left pleural effusion. No pulmonary edema.  IMPRESSION: Persistent significant opacity at the right lung base. Consider right lateral decubitus view to determine component of layering pleural effusion.   Electronically Signed   By: Nolon Nations M.D.   On: 07/14/2015 16:56   Dg Chest 2 View  07/12/2015   CLINICAL DATA:  Anterior chest pain for 1 week with cough  EXAM: CHEST  2 VIEW  COMPARISON:  None.  FINDINGS: There is mild elevation the right hemidiaphragm. There is evidence suggesting consolidation in the medial right base. Lungs elsewhere clear. Heart size and pulmonary vascularity are normal. No adenopathy. No bone lesions.  IMPRESSION: Evidence of a degree of consolidation in the medial right base. There is elevation of the right hemidiaphragm. Lungs elsewhere clear. Cardiac silhouette within normal limits.  Followup PA and lateral chest radiographs recommended in 3-4 weeks following trial of antibiotic therapy to ensure resolution and exclude underlying malignancy.   Electronically Signed   By: Lowella Grip III M.D.   On: 07/12/2015 15:52   Ct Angio Chest Pe W/cm &/or Wo Cm  07/14/2015   CLINICAL DATA:  Right chest pain  EXAM: CT ANGIOGRAPHY CHEST WITH CONTRAST  TECHNIQUE: Multidetector CT imaging of the chest was performed using the standard protocol during bolus administration of intravenous contrast. Multiplanar CT image reconstructions and MIPs were obtained to evaluate the vascular anatomy.  CONTRAST:  143m OMNIPAQUE IOHEXOL 350 MG/ML SOLN  COMPARISON:  Chest x-ray 07/14/2015  FINDINGS: Negative for pulmonary embolism. Negative  for aortic dissection or aneurysm  Moderately large right pleural effusion with compressive atelectasis of the right lower lobe. Atelectasis also present in the right middle lobe. Partial collapse of the right upper lobe medially adjacent to the mediastinum. This extends into the right upper lobe laterally. Small left pleural effusion. No significant infiltrate on the left.  No adenopathy is present in the mediastinum.  No acute bony abnormality  In the upper abdomen, there is mild to moderate ascites. This could be due to liver disease. There is fluid around the liver and spleen. The spleen is not enlarged. Limited imaging of the pancreas  and kidneys are grossly normal. There is soft tissue infiltration in the omentum. Carcinoma not excluded.  Review of the MIP images confirms the above findings.  IMPRESSION: Negative for pulmonary embolism  Moderate right pleural effusion with compressive atelectasis in the right lower lobe. Additional atelectasis in the right middle lobe and right upper lobe. No definite mass or pneumonia however infection and tumor are in the differential. There is a small left effusion  Concerning findings in the abdomen. There is ascites around the liver and spleen. There is soft tissue density involving the omentum which can be seen with carcinoma. Further imaging of the abdomen with CT abdomen pelvis with contrast is suggested.   Electronically Signed   By: Franchot Gallo M.D.   On: 07/14/2015 18:28   Ct Abdomen Pelvis W Contrast  07/15/2015   CLINICAL DATA:  39 year old male with ascites and potential omental disease noted on prior chest CT. Follow-up abdominal CT scan.  EXAM: CT ABDOMEN AND PELVIS WITH CONTRAST  TECHNIQUE: Multidetector CT imaging of the abdomen and pelvis was performed using the standard protocol following bolus administration of intravenous contrast.  CONTRAST:  113m OMNIPAQUE IOHEXOL 300 MG/ML  SOLN  COMPARISON:  CT of the chest 07/14/2015. CT of the pelvis  03/03/2008.  FINDINGS: Lower chest: Small bilateral pleural effusions (left greater than right). Subsegmental atelectasis in the posterior aspect of the right lower lobe.  Hepatobiliary: No cystic or solid hepatic lesion identified. No intra or extrahepatic biliary ductal dilatation. High attenuation material filling the gallbladder, presumably vicarious excretion of contrast material from the recent chest CT.  Pancreas: No pancreatic mass. No pancreatic ductal dilatation. No pancreatic or peripancreatic fluid or inflammatory changes.  Spleen: Unremarkable.  Adrenals/Urinary Tract: Bilateral adrenal glands and bilateral kidneys are unremarkable in appearance. No hydroureteronephrosis. Urinary bladder is unremarkable in appearance.  Stomach/Bowel: In the mid small bowel (like mid to distal jejunum) there is a profoundly asymmetrically thickened loop of bowel best demonstrated on image 56 of series 2, where the wall measures up to 22 mm in thickness medially. Adjacent to this there are soft tissue masses extending into the root of the small bowel mesentery, largest of which measures 2.7 x 3.3 cm (image 47 of series 2), likely malignant lymph nodes. The appendix is not confidently identified. No pathologic dilatation of small bowel or colon. Stomach is grossly normal in appearance.  Vascular/Lymphatic: No significant atherosclerotic disease, aneurysm or dissection identified in the abdominal or pelvic vasculature. Circumaortic left renal vein (normal anatomical variant) incidentally noted. Probable mesenteric lymphadenopathy, as discussed above.  Reproductive: Prostate gland and seminal vesicles are unremarkable in appearance. Inguinal canals and visualized portions of the scrotum were grossly unremarkable in appearance. No lymphadenopathy noted along the gonadal drainage system.  Other: Large amount of abnormal enhancing soft tissue throughout the peritoneal cavity, most notable in the omentum, particularly in the  right upper quadrant where there is bulky omental caking. Small volume of presumably malignant ascites. No pneumoperitoneum. Some abnormal soft tissue is extending through the anterior abdominal wall into the periumbilical region (image 53 of series 2).  Musculoskeletal: There are no aggressive appearing lytic or blastic lesions noted in the visualized portions of the skeleton.  IMPRESSION: 1. Widespread intraperitoneal malignancy, as demonstrated by extensive peritoneal nodularity and enhancement, widespread omental caking, likely malignant ascites and extensive mesenteric lymphadenopathy. The exact source of malignancy is uncertain, however, given the focally thickened loop of small bowel (likely jejunal) in the left side of the central abdomen (image  72 of series 2), a primary small bowel malignancy is suspected. 2. Small bilateral pleural effusions (left greater than right). Right-sided pleural effusion significantly decreased following thoracentesis. Resolving subsegmental atelectasis in the right lower lobe. 3. Additional incidental findings, as above. These results were called by telephone at the time of interpretation on 07/15/2015 at 5:33 pm to Dr. Olevia Bowens, who verbally acknowledged these results.   Electronically Signed   By: Vinnie Langton M.D.   On: 07/15/2015 17:34   Nm Pet Image Initial (pi) Skull Base To Thigh  07/23/2015   CLINICAL DATA:  Initial treatment strategy for high-grade non-Hodgkin's lymphoma. B-cell lymphoma.  EXAM: NUCLEAR MEDICINE PET SKULL BASE TO THIGH  TECHNIQUE: 10.0 mCi F-18 FDG was injected intravenously. Full-ring PET imaging was performed from the skull base to thigh after the radiotracer. CT data was obtained and used for attenuation correction and anatomic localization.  FASTING BLOOD GLUCOSE:  Value: 88 mg/dl  COMPARISON:  CT abdomen 07/15/2015, CT thorax 07/14/2015  FINDINGS: NECK  Small hypermetabolic lymph nodes the just deep to the RIGHT clavicle measure 9 mm short axis  on image 54, series 4 with intense metabolic activity (SUV max 6.2).  CHEST  Small hypermetabolic internal mammary lymph nodes on the RIGHT (image 66 with SUV max equal 5.). Small hypermetabolic nodule posterior to the sternum in the anterior mediastinum on image 69 of fused data set.  Within the precordial space anterior to the liver there is a 4.5 cm hypermetabolic mass. Smaller hypermetabolic nodule anterior to the RIGHT ventricle.  ABDOMEN/PELVIS  There is a thin rim of intense metabolic activity associated entirety of the peritoneal surface consists with peritoneal metastasis / carcinomatosis. There is more focal activity within the LEFT abdomen associated with the small bowel wall thickening. This segmental thickening measures 17 mm in single wall thickness with intense metabolic activity (SUV max 14.6). Central mesenteric mass measures 3 cm with SUV max 13.2.  There are no hypermetabolic inguinal or iliac lymph nodes.  SKELETON  No focal hypermetabolic activity to suggest skeletal metastasis.  IMPRESSION: 1. Thin rim of hypermetabolic activity throughout the peritoneal space consistent with peritoneal carcinomatosis. 2. Focal intense metabolic activity associated with the proximal small bowel consistent with lymphoma involvement of bowel. 3. Hypermetabolic mesenteric nodule  /node consistent with lymphoma. 4. Several small hypermetabolic lymph nodes in the RIGHT supraclavicular and internal mammary nodal stations. 5. Nodal metastasis anterior to the liver in the precordial space. This may be the most assessable for biopsy. 6. Normal spleen and bone marrow.   Electronically Signed   By: Suzy Bouchard M.D.   On: 07/23/2015 11:35   US Biopsy  07/17/2015   CLINICAL DATA:  Extensive peritoneal nodularity and omental caking of uncertain etiology. Periumbilical masses.  EXAM: ULTRASOUND-GUIDED CORE PERIUMBILICAL BIOPSY  TECHNIQUE: An ultrasound guided biopsy was thoroughly discussed with the patient and  questions were answered. The benefits, risks, alternatives, and complications were also discussed. The patient understands and wishes to proceed with the procedure. A verbal as well as written consent was obtained.  Survey ultrasound of the periumbilical region was performed and an appropriate skin entry site was determined. Skin site was marked, prepped with chlorhexidine, and draped in usual sterile fashion, and infiltrated locally with 1% lidocaine.  Intravenous Fentanyl and Versed were administered as conscious sedation during continuous cardiorespiratory monitoring by the radiology RN, with a total moderate sedation time of less than 30 minutes.  Under real-time ultrasound guidance, a 17 gauge trocar needle was advanced to  the margin of the lesion. Once needle tip position was confirmed, coaxial 18-gauge core biopsy samples were obtained, submitted in saline to surgical pathology. The guide needle was removed. Postprocedure scans show no hemorrhage or other apparent complication.  COMPLICATIONS: COMPLICATIONS None immediate  FINDINGS: Nodular periumbilical subcutaneous masses were localized. Core biopsy samples were obtained without complication.  IMPRESSION: 1. Technically successful ultrasound guided core periumbilical nodule biopsy.   Electronically Signed   By: Lucrezia Europe M.D.   On: 07/17/2015 16:15   US Paracentesis  07/17/2015   CLINICAL DATA:  Omental disease and peritoneal nodules.  EXAM: ULTRASOUND GUIDED PARACENTESIS  TECHNIQUE: The procedure, risks (including but not limited to bleeding, infection, organ damage ), benefits, and alternatives were explained to the patient. Questions regarding the procedure were encouraged and answered. The patient understands and consents to the procedure. Survey ultrasound of the abdomen was performed and an appropriate skin entry site in the right lower abdomen was selected. Skin site was marked, prepped with Betadine, and draped in usual sterile fashion, and  infiltrated locally with 1% lidocaine. A 5 French multisidehole Yueh sheath needle was advanced into the peritoneal space until fluid could be aspirated. The sheath was advanced and the needle removed. 1.2 L of cloudy yellow ascites were aspirated. A sample sent to cytology for the requested studies.  COMPLICATIONS: COMPLICATIONS none  IMPRESSION: Technically successful ultrasound guided paracentesis, removing 1.2 L of ascites. Sample to cytology.   Electronically Signed   By: Lucrezia Europe M.D.   On: 07/17/2015 16:17   Ir Fluoro Guide Cv Line Right  07/27/2015   CLINICAL DATA:  New diagnosis of Burkitt's lymphoma. The patient requires a porta cath for chemotherapy.  EXAM: IMPLANTED PORT A CATH PLACEMENT WITH ULTRASOUND AND FLUOROSCOPIC GUIDANCE  ANESTHESIA/SEDATION: 6.0 Mg IV Versed; 150 mcg IV Fentanyl  Total Moderate Sedation Time:  30 minutes  Additional Medications: 2 g IV Ancef. As antibiotic prophylaxis, Ancef was ordered pre-procedure and administered intravenously within one hour of incision.  FLUOROSCOPY TIME:  24 seconds.  PROCEDURE: The procedure, risks, benefits, and alternatives were explained to the patient. Questions regarding the procedure were encouraged and answered. The patient understands and consents to the procedure. A time-out was performed prior to the procedure.  Ultrasound was used to confirm patency of the right internal jugular vein. The right neck and chest were prepped with chlorhexidine in a sterile fashion, and a sterile drape was applied covering the operative field. Maximum barrier sterile technique with sterile gowns and gloves were used for the procedure. Local anesthesia was provided with 1% lidocaine.  After creating a small venotomy incision, a 21 gauge needle was advanced into the right internal jugular vein under direct, real-time ultrasound guidance. Ultrasound image documentation was performed. After securing guidewire access, an 8 Fr dilator was placed. A J-wire was  kinked to measure appropriate catheter length.  A subcutaneous port pocket was then created along the upper chest wall utilizing sharp and blunt dissection. Portable cautery was utilized. The pocket was irrigated with sterile saline.  A single lumen power injectable port was chosen for placement. The 8 Fr catheter was tunneled from the port pocket site to the venotomy incision. The port was placed in the pocket. External catheter was trimmed to appropriate length based on guidewire measurement.  At the venotomy, an 8 Fr peel-away sheath was placed over a guidewire. The catheter was then placed through the sheath and the sheath removed. Final catheter positioning was confirmed and documented with a  fluoroscopic spot image. The port was accessed with a needle and aspirated and flushed with heparinized saline. The needle was left in place for immediate use.  The venotomy and port pocket incisions were closed with subcutaneous 3-0 Monocryl and subcuticular 4-0 Vicryl. Dermabond was applied to both incisions.  COMPLICATIONS: None  FINDINGS: After catheter placement, the tip lies at the cavoatrial junction. The catheter aspirates normally and is ready for immediate use.  IMPRESSION: Placement of single lumen port a cath via right internal jugular vein. The catheter tip lies at the cavoatrial junction. A power injectable port a cath was placed and is ready for immediate use.   Electronically Signed   By: Aletta Edouard M.D.   On: 07/27/2015 15:28   Ir US Guide Vasc Access Right  07/27/2015   CLINICAL DATA:  New diagnosis of Burkitt's lymphoma. The patient requires a porta cath for chemotherapy.  EXAM: IMPLANTED PORT A CATH PLACEMENT WITH ULTRASOUND AND FLUOROSCOPIC GUIDANCE  ANESTHESIA/SEDATION: 6.0 Mg IV Versed; 150 mcg IV Fentanyl  Total Moderate Sedation Time:  30 minutes  Additional Medications: 2 g IV Ancef. As antibiotic prophylaxis, Ancef was ordered pre-procedure and administered intravenously within one hour  of incision.  FLUOROSCOPY TIME:  24 seconds.  PROCEDURE: The procedure, risks, benefits, and alternatives were explained to the patient. Questions regarding the procedure were encouraged and answered. The patient understands and consents to the procedure. A time-out was performed prior to the procedure.  Ultrasound was used to confirm patency of the right internal jugular vein. The right neck and chest were prepped with chlorhexidine in a sterile fashion, and a sterile drape was applied covering the operative field. Maximum barrier sterile technique with sterile gowns and gloves were used for the procedure. Local anesthesia was provided with 1% lidocaine.  After creating a small venotomy incision, a 21 gauge needle was advanced into the right internal jugular vein under direct, real-time ultrasound guidance. Ultrasound image documentation was performed. After securing guidewire access, an 8 Fr dilator was placed. A J-wire was kinked to measure appropriate catheter length.  A subcutaneous port pocket was then created along the upper chest wall utilizing sharp and blunt dissection. Portable cautery was utilized. The pocket was irrigated with sterile saline.  A single lumen power injectable port was chosen for placement. The 8 Fr catheter was tunneled from the port pocket site to the venotomy incision. The port was placed in the pocket. External catheter was trimmed to appropriate length based on guidewire measurement.  At the venotomy, an 8 Fr peel-away sheath was placed over a guidewire. The catheter was then placed through the sheath and the sheath removed. Final catheter positioning was confirmed and documented with a fluoroscopic spot image. The port was accessed with a needle and aspirated and flushed with heparinized saline. The needle was left in place for immediate use.  The venotomy and port pocket incisions were closed with subcutaneous 3-0 Monocryl and subcuticular 4-0 Vicryl. Dermabond was applied to both  incisions.  COMPLICATIONS: None  FINDINGS: After catheter placement, the tip lies at the cavoatrial junction. The catheter aspirates normally and is ready for immediate use.  IMPRESSION: Placement of single lumen port a cath via right internal jugular vein. The catheter tip lies at the cavoatrial junction. A power injectable port a cath was placed and is ready for immediate use.   Electronically Signed   By: Aletta Edouard M.D.   On: 07/27/2015 15:28   Ct Biopsy  07/24/2015   CLINICAL DATA:  39 year old male with a new diagnosis of lymphoma.  EXAM: CT BIOPSY  Date: 07/24/2015  PROCEDURE: 1. CT guided bone marrow aspiration and core biopsy Interventional Radiologist:  Criselda Peaches, MD  ANESTHESIA/SEDATION: Moderate (conscious) sedation was used. 2.5 mg Versed, 100 mcg Fentanyl were administered intravenously. The patient's vital signs were monitored continuously by radiology nursing throughout the procedure.  Sedation Time: 9 minutes  FLUOROSCOPY TIME:  None  TECHNIQUE: Informed consent was obtained from the patient following explanation of the procedure, risks, benefits and alternatives. The patient understands, agrees and consents for the procedure. All questions were addressed. A time out was performed.  The patient was positioned prone and noncontrast localization CT was performed of the pelvis to demonstrate the iliac marrow spaces.  Maximal barrier sterile technique utilized including caps, mask, sterile gowns, sterile gloves, large sterile drape, hand hygiene, and betadine prep.  Under sterile conditions and local anesthesia, an 11 gauge coaxial bone biopsy needle was advanced into the right iliac marrow space. Needle position was confirmed with CT imaging. Initially, bone marrow aspiration was performed. Next, the 11 gauge outer cannula was utilized to obtain a right iliac bone marrow core biopsy. Needle was removed. Hemostasis was obtained with compression. The patient tolerated the procedure  well. Samples were prepared with the cytotechnologist. No immediate complications.  IMPRESSION: CT guided right iliac bone marrow aspiration and core biopsy. The On-Control drill was used.  Signed,  Criselda Peaches, MD  [facility]  [facility]   Electronically Signed   By: Jacqulynn Cadet M.D.   On: 07/24/2015 12:32   Dg Chest Port 1 View  07/22/2015   CLINICAL DATA:  Pulmonary edema, hypoxia  EXAM: PORTABLE CHEST - 1 VIEW  COMPARISON:  07/21/2015  FINDINGS: Heart size is normal. Right-sided PICC line in place with tip over the cavoatrial junction. Hazy perihilar rate greater than left basilar airspace opacities are slightly decreased. Trace pleural effusions.  IMPRESSION: Interval slight decrease in hazy perihilar airspace opacities compatible with improving edema.   Electronically Signed   By: Conchita Paris M.D.   On: 07/22/2015 08:10   Dg Chest Port 1 View  07/21/2015   CLINICAL DATA:  Acute respiratory failure with hypoxemia. Nonsmoker.  EXAM: PORTABLE CHEST - 1 VIEW  COMPARISON:  07/20/2015.  FINDINGS: Cardiomediastinal silhouette appears within normal limits for size. Diffuse pulmonary opacities persist but are slightly improved consistent with resolving edema. Decreased BILATERAL pleural effusions. PICC line tip SVC. No pneumothorax.  IMPRESSION: Improving pulmonary edema.   Electronically Signed   By: Staci Righter M.D.   On: 07/21/2015 09:38   Dg Chest Port 1 View  07/20/2015   CLINICAL DATA:  Shortness of Breath  EXAM: PORTABLE CHEST - 1 VIEW  COMPARISON:  July 15, 2015  FINDINGS: There is alveolar edema throughout both lungs. There are small bilateral effusions. Heart is upper normal in size. There is mild pulmonary venous hypertension. No adenopathy.  IMPRESSION: Extensive edema. Question a degree of congestive heart failure versus noncardiogenic edema. Note that cardiac silhouette is unchanged compared to recent prior study. No  adenopathy apparent.   Electronically Signed   By: Lowella Grip III M.D.   On: 07/20/2015 15:36   Dg Fluoro Guide Lumbar Puncture  07/24/2015   CLINICAL DATA:  Lymphoma  EXAM: DIAGNOSTIC LUMBAR PUNCTURE UNDER FLUOROSCOPIC GUIDANCE  FLUOROSCOPY TIME:  Radiation Exposure Index (as provided by the fluoroscopic device):  If the device does not provide the exposure index:  Fluoroscopy Time (in minutes and seconds):  13 seconds  Number of Acquired Images:  0  PROCEDURE: Informed consent was obtained from the patient prior to the procedure, including potential complications of headache, allergy, and pain. With the patient prone, the lower back was prepped with Betadine. 1% Lidocaine was used for local anesthesia. Lumbar puncture was performed at the L3-4 level using a 22 gauge needle with return of clear CSF with an opening pressure of 14 cm water. 12 ml of CSF were obtained for laboratory studies. The patient tolerated the procedure well and there were no apparent complications.  IMPRESSION: 1. Technically successful lumbar puncture. 12 cc of clear CSF were obtained for laboratory studies.   Electronically Signed   By: Kerby Moors M.D.   On: 07/24/2015 12:31    Microbiology: Recent Results (from the past 240 hour(s))  CSF culture     Status: None   Collection Time: 07/24/15 11:46 AM  Result Value Ref Range Status   Specimen Description CSF  Final   Special Requests NONE  Final   Gram Stain   Final    WBC PRESENT, PREDOMINANTLY MONONUCLEAR NO ORGANISMS SEEN CYTOSPIN Gram Stain Report Called to,Read Back By and Verified With: BERGER,J. RN _0  ON 9.13.16 BY MCCOY,N.    Culture   Final    NO GROWTH 3 DAYS Performed at [facility]    Report Status 07/27/2015 FINAL  Final     Labs: Basic Metabolic Panel:  Recent Labs Lab 07/25/15 0550  07/26/15 0500  07/27/15 0437  07/28/15 0545 07/28/15 1610 07/29/15 0635 07/29/15 1720 07/30/15 0410  NA  --   < > 138  < > 138  < > 138  136 138 135 138  K  --   < > 4.2  < > 3.9  < > 4.0 3.9 3.6 3.8 3.6  CL  --   < > 104  < > 107  < > 106 104 105 102 102  CO2  --   < > 25  < > 24  < > _1 GLUCOSE  --   < > 135*  < > 137*  < > 115* 123* 89 141* 94  BUN  --   < > 26*  < > 25*  < > 23* 22* 21* 22* 22*  CREATININE  --   < > 0.84  < > 0.87  < > 0.78 0.83 0.74 0.83 0.73  CALCIUM  --   < > 8.0*  < > 8.3*  < > 8.3* 8.3* 8.3* 8.3* 8.6*  MG 1.8  --  2.3  --  2.2  --  1.9  --  1.9  --   --   PHOS 3.6  --  6.8*  --  4.7*  --  4.2  --  3.4  --   --   < > = values in this interval not displayed. Liver Function Tests:  Recent Labs Lab 07/28/15 0545 07/28/15 1610 07/29/15 0635 07/29/15 1720 07/30/15 0410  AST 63* 66* 51* 136* 74*  ALT 125* 134* 108* 204* 157*  ALKPHOS 43 49 45 58 46  BILITOT 0.7 0.8 0.9 0.8 1.0  PROT 5.0* 5.5* 5.0* 5.8* 5.1*  ALBUMIN 2.6* 3.0* 2.7* 3.2* 2.8*   No results for input(s): LIPASE, AMYLASE in the last 168 hours. No results for input(s): AMMONIA in the last 168 hours. CBC:  Recent Labs Lab 07/26/15 0500 07/27/15 0437 07/28/15 0545 07/29/15 8469  07/30/15 0410  WBC 13.4* 15.1* 8.8 8.0 6.9  NEUTROABS 11.1* 12.7* 7.4 5.6 4.9  HGB 12.9* 12.1* 11.8* 12.2* 12.0*  HCT 37.3* 35.3* 34.8* 35.6* 34.3*  MCV 84.4 84.7 85.5 84.8 83.9  PLT 252 248 253 262 258   Cardiac Enzymes: No results for input(s): CKTOTAL, CKMB, CKMBINDEX, TROPONINI in the last 168 hours. BNP: BNP (last 3 results)  Recent Labs  07/20/15 1646  BNP 13.9    ProBNP (last 3 results) No results for input(s): PROBNP in the last 8760 hours.  CBG: No results for input(s): GLUCAP in the last 168 hours.     Signed:  Niel Hummer A  [facility] 07/30/2015, 1:41 PM

## 2015-07-30 NOTE — Progress Notes (Signed)
Rituximab infusion increased to 200mg /hr.  Patient tolerating procedure well, no s/s of reaction. Will continue to monitor.

## 2015-07-30 NOTE — Progress Notes (Signed)
Rituximab infusion initiated at 50mg /hr. Will continue to monitor.

## 2015-07-30 NOTE — Progress Notes (Signed)
Rituximab infusion increased to 350mg /hr.  Patient tolerating procedure well, no s/s of reaction. Will continue to monitor.

## 2015-07-30 NOTE — Progress Notes (Signed)
Patient discharged to home, all discharge medications and instructions reviewed and questions answered. Patient to be assisted to vehicle by wheelchair when ride arrives.  

## 2015-07-30 NOTE — Progress Notes (Signed)
Rituximab infusion complete, patient tolerated infusion well with no s/s of reaction.

## 2015-07-30 NOTE — Progress Notes (Signed)
Rituxamab infusion increased to 300mg /hr. Patient tolerating procedure well. No s/s of reaction. Will continue to monitor.

## 2015-07-30 NOTE — Progress Notes (Signed)
Rituximab infusion increased to 400mg /hr.  Patient tolerating procedure well, no s/s of reaction. Will continue to monitor.

## 2015-07-30 NOTE — Progress Notes (Signed)
Rituximab infusion increased to 150mg /hr. Patient tolerating procedure well, no s/s of reaction noted. Will continue to monitor.

## 2015-07-31 ENCOUNTER — Ambulatory Visit (HOSPITAL_BASED_OUTPATIENT_CLINIC_OR_DEPARTMENT_OTHER): Payer: BLUE CROSS/BLUE SHIELD

## 2015-07-31 ENCOUNTER — Ambulatory Visit: Payer: BLUE CROSS/BLUE SHIELD

## 2015-07-31 VITALS — BP 113/70 | HR 60 | Temp 98.2°F

## 2015-07-31 DIAGNOSIS — Z5189 Encounter for other specified aftercare: Secondary | ICD-10-CM

## 2015-07-31 DIAGNOSIS — C8370 Burkitt lymphoma, unspecified site: Secondary | ICD-10-CM | POA: Diagnosis not present

## 2015-07-31 LAB — CHROMOSOME ANALYSIS, BONE MARROW

## 2015-07-31 LAB — TISSUE HYBRIDIZATION (BONE MARROW)-NCBH

## 2015-07-31 MED ORDER — PEGFILGRASTIM INJECTION 6 MG/0.6ML ~~LOC~~
6.0000 mg | PREFILLED_SYRINGE | Freq: Once | SUBCUTANEOUS | Status: AC
  Administered 2015-07-31: 6 mg via SUBCUTANEOUS
  Filled 2015-07-31: qty 0.6

## 2015-07-31 NOTE — Patient Instructions (Signed)
Pegfilgrastim injection What is this medicine? PEGFILGRASTIM (peg fil GRA stim) is a long-acting granulocyte colony-stimulating factor that stimulates the growth of neutrophils, a type of white blood cell important in the body's fight against infection. It is used to reduce the incidence of fever and infection in patients with certain types of cancer who are receiving chemotherapy that affects the bone marrow. This medicine may be used for other purposes; ask your health care provider or pharmacist if you have questions. COMMON BRAND NAME(S): Neulasta What should I tell my health care provider before I take this medicine? They need to know if you have any of these conditions: -latex allergy -ongoing radiation therapy -sickle cell disease -skin reactions to acrylic adhesives (On-Body Injector only) -an unusual or allergic reaction to pegfilgrastim, filgrastim, other medicines, foods, dyes, or preservatives -pregnant or trying to get pregnant -breast-feeding How should I use this medicine? This medicine is for injection under the skin. If you get this medicine at home, you will be taught how to prepare and give the pre-filled syringe or how to use the On-body Injector. Refer to the patient Instructions for Use for detailed instructions. Use exactly as directed. Take your medicine at regular intervals. Do not take your medicine more often than directed. It is important that you put your used needles and syringes in a special sharps container. Do not put them in a trash can. If you do not have a sharps container, call your pharmacist or healthcare provider to get one. Talk to your pediatrician regarding the use of this medicine in children. Special care may be needed. Overdosage: If you think you have taken too much of this medicine contact a poison control center or emergency room at once. NOTE: This medicine is only for you. Do not share this medicine with others. What if I miss a dose? It is  important not to miss your dose. Call your doctor or health care professional if you miss your dose. If you miss a dose due to an On-body Injector failure or leakage, a new dose should be administered as soon as possible using a single prefilled syringe for manual use. What may interact with this medicine? Interactions have not been studied. Give your health care provider a list of all the medicines, herbs, non-prescription drugs, or dietary supplements you use. Also tell them if you smoke, drink alcohol, or use illegal drugs. Some items may interact with your medicine. This list may not describe all possible interactions. Give your health care provider a list of all the medicines, herbs, non-prescription drugs, or dietary supplements you use. Also tell them if you smoke, drink alcohol, or use illegal drugs. Some items may interact with your medicine. What should I watch for while using this medicine? You may need blood work done while you are taking this medicine. If you are going to need a MRI, CT scan, or other procedure, tell your doctor that you are using this medicine (On-Body Injector only). What side effects may I notice from receiving this medicine? Side effects that you should report to your doctor or health care professional as soon as possible: -allergic reactions like skin rash, itching or hives, swelling of the face, lips, or tongue -dizziness -fever -pain, redness, or irritation at site where injected -pinpoint red spots on the skin -shortness of breath or breathing problems -stomach or side pain, or pain at the shoulder -swelling -tiredness -trouble passing urine Side effects that usually do not require medical attention (report to your doctor   or health care professional if they continue or are bothersome): -bone pain -muscle pain This list may not describe all possible side effects. Call your doctor for medical advice about side effects. You may report side effects to FDA at  1-800-FDA-1088. Where should I keep my medicine? Keep out of the reach of children. Store pre-filled syringes in a refrigerator between 2 and 8 degrees C (36 and 46 degrees F). Do not freeze. Keep in carton to protect from light. Throw away this medicine if it is left out of the refrigerator for more than 48 hours. Throw away any unused medicine after the expiration date. NOTE: This sheet is a summary. It may not cover all possible information. If you have questions about this medicine, talk to your doctor, pharmacist, or health care provider.  2015, [employer]. (2014-01-26 16:14:05)  

## 2015-08-03 ENCOUNTER — Other Ambulatory Visit (HOSPITAL_BASED_OUTPATIENT_CLINIC_OR_DEPARTMENT_OTHER): Payer: BLUE CROSS/BLUE SHIELD

## 2015-08-03 ENCOUNTER — Ambulatory Visit (HOSPITAL_COMMUNITY)
Admission: RE | Admit: 2015-08-03 | Discharge: 2015-08-03 | Disposition: A | Discharge status: Home / Self Care | Payer: BLUE CROSS/BLUE SHIELD | Source: Ambulatory Visit | Attending: Hematology | Admitting: Hematology

## 2015-08-03 ENCOUNTER — Telehealth: Payer: Self-pay | Admitting: Hematology

## 2015-08-03 ENCOUNTER — Encounter: Payer: Self-pay | Admitting: Hematology

## 2015-08-03 ENCOUNTER — Ambulatory Visit (HOSPITAL_BASED_OUTPATIENT_CLINIC_OR_DEPARTMENT_OTHER): Payer: BLUE CROSS/BLUE SHIELD | Admitting: Hematology

## 2015-08-03 VITALS — BP 119/67 | HR 57 | Temp 98.7°F | Resp 20 | Ht 71.0 in | Wt 178.8 lb

## 2015-08-03 DIAGNOSIS — R7989 Other specified abnormal findings of blood chemistry: Secondary | ICD-10-CM

## 2015-08-03 DIAGNOSIS — R945 Abnormal results of liver function studies: Secondary | ICD-10-CM

## 2015-08-03 DIAGNOSIS — R946 Abnormal results of thyroid function studies: Secondary | ICD-10-CM | POA: Diagnosis not present

## 2015-08-03 DIAGNOSIS — C8370 Burkitt lymphoma, unspecified site: Secondary | ICD-10-CM

## 2015-08-03 DIAGNOSIS — R0602 Shortness of breath: Secondary | ICD-10-CM | POA: Diagnosis not present

## 2015-08-03 DIAGNOSIS — E883 Tumor lysis syndrome: Secondary | ICD-10-CM | POA: Diagnosis not present

## 2015-08-03 DIAGNOSIS — C8379 Burkitt lymphoma, extranodal and solid organ sites: Secondary | ICD-10-CM | POA: Diagnosis not present

## 2015-08-03 LAB — PHOSPHORUS: Phosphorus: 3 mg/dL (ref 2.5–4.5)

## 2015-08-03 LAB — COMPREHENSIVE METABOLIC PANEL (CC13)
ALBUMIN: 3.6 g/dL (ref 3.5–5.0)
ALK PHOS: 90 U/L (ref 40–150)
ALT: 114 U/L — ABNORMAL HIGH (ref 0–55)
ANION GAP: 7 meq/L (ref 3–11)
AST: 32 U/L (ref 5–34)
BUN: 17.2 mg/dL (ref 7.0–26.0)
CALCIUM: 9 mg/dL (ref 8.4–10.4)
CO2: 30 mEq/L — ABNORMAL HIGH (ref 22–29)
Chloride: 102 mEq/L (ref 98–109)
Creatinine: 0.8 mg/dL (ref 0.7–1.3)
Glucose: 86 mg/dl (ref 70–140)
POTASSIUM: 4.1 meq/L (ref 3.5–5.1)
Sodium: 139 mEq/L (ref 136–145)
Total Bilirubin: 0.97 mg/dL (ref 0.20–1.20)
Total Protein: 6.3 g/dL — ABNORMAL LOW (ref 6.4–8.3)

## 2015-08-03 LAB — CBC & DIFF AND RETIC
BASO%: 0.6 % (ref 0.0–2.0)
BASOS ABS: 0 10*3/uL (ref 0.0–0.1)
EOS%: 2.5 % (ref 0.0–7.0)
Eosinophils Absolute: 0.1 10*3/uL (ref 0.0–0.5)
HEMATOCRIT: 39.3 % (ref 38.4–49.9)
HEMOGLOBIN: 13.1 g/dL (ref 13.0–17.1)
Immature Retic Fract: 2.2 % — ABNORMAL LOW (ref 3.00–10.60)
LYMPH%: 25.5 % (ref 14.0–49.0)
MCH: 28.7 pg (ref 27.2–33.4)
MCHC: 33.3 g/dL (ref 32.0–36.0)
MCV: 86.2 fL (ref 79.3–98.0)
MONO#: 0 10*3/uL — ABNORMAL LOW (ref 0.1–0.9)
MONO%: 0.8 % (ref 0.0–14.0)
NEUT#: 2.6 10*3/uL (ref 1.5–6.5)
NEUT%: 70.6 % (ref 39.0–75.0)
NRBC: 0 % (ref 0–0)
Platelets: 168 10*3/uL (ref 140–400)
RBC: 4.56 10*6/uL (ref 4.20–5.82)
RDW: 12.8 % (ref 11.0–14.6)
Retic %: <0.38 % — ABNORMAL LOW (ref 0.5–1.6)
Retic Ct Abs: 10.94 10*3/uL — ABNORMAL LOW (ref 34.80–93.90)
WBC: 3.6 10*3/uL — ABNORMAL LOW (ref 4.0–10.3)
lymph#: 0.9 10*3/uL (ref 0.9–3.3)

## 2015-08-03 LAB — URIC ACID (CC13): Uric Acid, Serum: 3.5 mg/dl (ref 2.6–7.4)

## 2015-08-03 LAB — LACTATE DEHYDROGENASE (CC13): LDH: 303 U/L — AB (ref 125–245)

## 2015-08-03 MED ORDER — OXYCODONE HCL ER 10 MG PO T12A: 10.0000 mg | EXTENDED_RELEASE_TABLET | Freq: Two times a day (BID) | ORAL | Status: DC

## 2015-08-03 MED ORDER — ONDANSETRON HCL 8 MG PO TABS: 8.0000 mg | ORAL_TABLET | Freq: Three times a day (TID) | ORAL | Status: DC | PRN

## 2015-08-03 NOTE — Telephone Encounter (Signed)
No POF sent pt back to labs and gave AVS  Pt is to go to radiology for chest x-ray... KJ

## 2015-08-06 LAB — TISSUE HYBRIDIZATION TO NCBH

## 2015-08-07 ENCOUNTER — Encounter (HOSPITAL_COMMUNITY): Payer: Self-pay

## 2015-08-08 ENCOUNTER — Encounter (HOSPITAL_COMMUNITY): Payer: Self-pay

## 2015-08-09 ENCOUNTER — Other Ambulatory Visit: Payer: Self-pay | Admitting: *Deleted

## 2015-08-09 NOTE — Progress Notes (Signed)
Donald [name]    HEMATOLOGY/ONCOLOGY CLINIC NOTE  Date of Service: 08/03/2015  Patient Care Team: [name], DO as PCP - General (Internal Medicine)  CHIEF COMPLAINTS/PURPOSE OF CONSULTATION:  Posthospitalization follow-up for Burkitt's lymphoma  Diagnosis: Stage III Burkitt's lymphoma without CNS involvement  TREATMENT EPOCH-R + CNS prophylaxis as per NEJM 2013 Dunleavy et al. S/p 1 cycle of EPOCH-R  INTERVAL HISTORY  [name] is a wonderful 39 year old otherwise healthy gentleman was seen in the hospital for new diagnosis of high-risk Burkitt's lymphoma without CNS involvement. He presented with a large right-sided pleural effusion and abdominal adenopathy, omental nodules involvement. Lumbar puncture was negative. The bone marrow was negative.  He tolerated first cycle of EPOCh-R very well with no immediate acute toxicities.He is here for his 1 week posthospitalization follow-up for toxicity check. He notes some pleuritic chest pain on the right side with deep inspiration which is improving. Chest x-ray showed much improved pleural effusion. No fevers or chills. Abdominal distention improved. Blood counts stable. His allopurinol was discontinued. Had significant bone pains from his Neulasta. Given adequate pain medications. Counseled to maintain good bowel prophylaxis.   MEDICAL HISTORY:  Past Medical History  Diagnosis Date  . EBV infection 2013    Patient notes that he had an EBV infection in 2013 that left him with chronic fatigue. She also notes that he had significant lymphadenopathy and thyroiditis at the time. He has been managing his symptoms with an alternative medicine practitioner in [place] and with other alternative medicines.  . Marijuana use, episodic     SURGICAL HISTORY: Past Surgical History  Procedure Laterality Date  . Tonsillectomy    . Appendectomy    . Laparoscopy N/A 07/20/2015    Procedure: LAPAROSCOPY DIAGNOSTIC, MULTIPLE BIOPSIES, DRAINAGE OF  ASCITES;  Surgeon: Fanny Skates, MD;  Location: [facility];  Service: General;  Laterality: N/A;    SOCIAL HISTORY: Social History   Social History  . Marital Status: Married    Spouse Name: N/A  . Number of Children: N/A  . Years of Education: N/A   Occupational History  . Not on file.   Social History Main Topics  . Smoking status: Never Smoker   . Smokeless tobacco: Not on file  . Alcohol Use: No  . Drug Use: 1.00 per week    Special: Marijuana  . Sexual Activity: Not on file   Other Topics Concern  . 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   He has a fiance and has been in a steady relationship.      He has tried to maintain a very healthy lifestyle and cycles about 100 miles a week. He also uses a fair number of over-the-counter alternative medicines to stay healthy.    FAMILY HISTORY: Family History  Problem Relation Age of Onset  . Heart failure Father     ALLERGIES:  is allergic to fluoride preparations and sulfa antibiotics.  MEDICATIONS:  Current Outpatient Prescriptions  Medication Sig Dispense Refill  . allopurinol (ZYLOPRIM) 300 MG tablet Take 0.5 tablets (150 mg total) by mouth 2 (two) times daily. 30 tablet 0  . Multiple Vitamin (MULTIVITAMIN) tablet Take 1 tablet by mouth daily.    Donald [name] oxycodone (OXY-IR) 5 MG capsule Take 1 capsule (5 mg total) by mouth every 6 (six) hours as needed. 30 capsule 0  . sodium bicarbonate/sodium chloride SOLN 1 application by Mouth  Rinse route 4 (four) times daily. 100 mL 0  . ondansetron (ZOFRAN) 8 MG tablet Take 1 tablet (8 mg total) by mouth every 8 (eight) hours as needed for nausea or vomiting. 30 tablet 3  . OxyCODONE (OXYCONTIN) 10 mg T12A 12 hr tablet Take 1 tablet (10 mg total) by mouth every 12 (twelve) hours. 60 tablet 0   No current facility-administered medications for this visit.    REVIEW OF SYSTEMS:    10  Point review of Systems was done is negative except as noted above.  PHYSICAL EXAMINATION: ECOG PERFORMANCE STATUS: 1 - Symptomatic but completely ambulatory  . Filed Vitals:   08/03/15 1002  Height: 5\' 11"  (1.803 m)  Weight: 178 lb 12.8 oz (81.103 kg)   Filed Weights   08/03/15 1002  Weight: 178 lb 12.8 oz (81.103 kg)   .Body mass index is 24.95 kg/(m^2).  GENERAL:alert, in no acute distress and comfortable SKIN: skin color, texture, turgor are normal, no rashes or significant lesions EYES: normal, conjunctiva are pink and non-injected, sclera clear OROPHARYNX:no exudate, no erythema and lips, buccal mucosa, and tongue normal  NECK: supple, no JVD, thyroid normal size, non-tender, without nodularity LYMPH:  no palpable lymphadenopathy in the cervical, axillary or inguinal LUNGS: clear to auscultation with normal respiratory effort. Slightly decreased breath sounds right base. HEART: regular rate & rhythm,  no murmurs and no lower extremity edema ABDOMEN: abdomen soft, non-tender, normoactive bowel sounds  Musculoskeletal: no cyanosis of digits and no clubbing  PSYCH: alert & oriented x 3 with fluent speech NEURO: no focal motor/sensory deficits  LABORATORY DATA:  I have reviewed the data as listed  . CBC Latest Ref Rng 08/03/2015 07/30/2015 07/29/2015  WBC 4.0 - 10.3 10e3/uL 3.6(L) 6.9 8.0  Hemoglobin 13.0 - 17.1 g/dL 13.1 12.0(L) 12.2(L)  Hematocrit 38.4 - 49.9 % 39.3 34.3(L) 35.6(L)  Platelets 140 - 400 10e3/uL 168 258 262    . CMP Latest Ref Rng 08/03/2015 07/30/2015 07/29/2015  Glucose 70 - 140 mg/dl 86 94 141(H)  BUN 7.0 - 26.0 mg/dL 17.2 22(H) 22(H)  Creatinine 0.7 - 1.3 mg/dL 0.8 0.73 0.83  Sodium 136 - 145 mEq/L 139 138 135  Potassium 3.5 - 5.1 mEq/L 4.1 3.6 3.8  Chloride 101 - 111 mmol/L - 102 102  CO2 22 - 29 mEq/L 30(H) 28 24  Calcium 8.4 - 10.4 mg/dL 9.0 8.6(L) 8.3(L)  Total Protein 6.4 - 8.3 g/dL 6.3(L) 5.1(L) 5.8(L)  Total Bilirubin 0.20 - 1.20 mg/dL 0.97  1.0 0.8  Alkaline Phos 40 - 150 U/L 90 46 58  AST 5 - 34 U/L 32 74(H) 136(H)  ALT 0 - 55 U/L 114(H) 157(H) 204(H)     RADIOGRAPHIC STUDIES: I have personally reviewed the radiological images as listed and agreed with the findings in the report. Dg Chest 2 View  08/03/2015   CLINICAL DATA:  Shortness of Breath, history of Burkitt's lymphoma status post first cycle of chemotherapy  EXAM: CHEST - 2 VIEW  COMPARISON:  07/22/2015  FINDINGS: Cardiac shadow is within normal limits. A right central venous port is seen in satisfactory position. The lungs are well aerated bilaterally. No focal infiltrate or sizable effusion is seen. No acute bony abnormality is noted.  IMPRESSION: No active disease.   Electronically Signed   By: Inez Catalina M.D.   On: 08/03/2015 14:19   Dg Chest 2 View  07/15/2015   CLINICAL DATA:  Followup right pleural effusion, status post right thoracentesis.  EXAM: CHEST  2 VIEW  COMPARISON:  Yesterday.  FINDINGS: Stable right upper companion rib shadows. No pneumothorax seen. Significantly decreased right pleural fluid. The interstitial markings remain mildly prominent. Normal sized heart. Interval mild left lower lobe linear density. Unremarkable bones.  IMPRESSION: 1. No pneumothorax following right thoracentesis. 2. Significantly less right pleural fluid. 3. Interval mild left lower lobe atelectasis. 4. Stable mild chronic interstitial lung disease.   Electronically Signed   By: Claudie Revering M.D.   On: 07/15/2015 17:21   Dg Chest 2 View  07/14/2015   CLINICAL DATA:  Right-sided chest pain for 2-3 days. Shortness of breath.  EXAM: CHEST  2 VIEW  COMPARISON:  07/12/2015  FINDINGS: Heart size is probably normal. Suspect lung opacity at the right base in addition to the elevated hemidiaphragm. Opacity at the right lung base may be related to subpulmonic effusion. Overall the appearance is stable since previous exam. There is a trace left pleural effusion. No pulmonary edema.  IMPRESSION:  Persistent significant opacity at the right lung base. Consider right lateral decubitus view to determine component of layering pleural effusion.   Electronically Signed   By: Nolon Nations M.D.   On: 07/14/2015 16:56   Dg Chest 2 View  07/12/2015   CLINICAL DATA:  Anterior chest pain for 1 week with cough  EXAM: CHEST  2 VIEW  COMPARISON:  None.  FINDINGS: There is mild elevation the right hemidiaphragm. There is evidence suggesting consolidation in the medial right base. Lungs elsewhere clear. Heart size and pulmonary vascularity are normal. No adenopathy. No bone lesions.  IMPRESSION: Evidence of a degree of consolidation in the medial right base. There is elevation of the right hemidiaphragm. Lungs elsewhere clear. Cardiac silhouette within normal limits.  Followup PA and lateral chest radiographs recommended in 3-4 weeks following trial of antibiotic therapy to ensure resolution and exclude underlying malignancy.   Electronically Signed   By: Lowella Grip III M.D.   On: 07/12/2015 15:52   Ct Angio Chest Pe W/cm &/or Wo Cm  07/14/2015   CLINICAL DATA:  Right chest pain  EXAM: CT ANGIOGRAPHY CHEST WITH CONTRAST  TECHNIQUE: Multidetector CT imaging of the chest was performed using the standard protocol during bolus administration of intravenous contrast. Multiplanar CT image reconstructions and MIPs were obtained to evaluate the vascular anatomy.  CONTRAST:  125mL OMNIPAQUE IOHEXOL 350 MG/ML SOLN  COMPARISON:  Chest x-ray 07/14/2015  FINDINGS: Negative for pulmonary embolism. Negative for aortic dissection or aneurysm  Moderately large right pleural effusion with compressive atelectasis of the right lower lobe. Atelectasis also present in the right middle lobe. Partial collapse of the right upper lobe medially adjacent to the mediastinum. This extends into the right upper lobe laterally. Small left pleural effusion. No significant infiltrate on the left.  No adenopathy is present in the mediastinum.   No acute bony abnormality  In the upper abdomen, there is mild to moderate ascites. This could be due to liver disease. There is fluid around the liver and spleen. The spleen is not enlarged. Limited imaging of the pancreas and kidneys are grossly normal. There is soft tissue infiltration in the omentum. Carcinoma not excluded.  Review of the MIP images confirms the above findings.  IMPRESSION: Negative for pulmonary embolism  Moderate right pleural effusion with compressive atelectasis in the right lower lobe. Additional atelectasis in the right middle lobe and right upper lobe. No definite mass or pneumonia however infection and tumor are in the differential. There is a small  left effusion  Concerning findings in the abdomen. There is ascites around the liver and spleen. There is soft tissue density involving the omentum which can be seen with carcinoma. Further imaging of the abdomen with CT abdomen pelvis with contrast is suggested.   Electronically Signed   By: Franchot Gallo M.D.   On: 07/14/2015 18:28   Ct Abdomen Pelvis W Contrast  07/15/2015   CLINICAL DATA:  39 year old male with ascites and potential omental disease noted on prior chest CT. Follow-up abdominal CT scan.  EXAM: CT ABDOMEN AND PELVIS WITH CONTRAST  TECHNIQUE: Multidetector CT imaging of the abdomen and pelvis was performed using the standard protocol following bolus administration of intravenous contrast.  CONTRAST:  156mL OMNIPAQUE IOHEXOL 300 MG/ML  SOLN  COMPARISON:  CT of the chest 07/14/2015. CT of the pelvis 03/03/2008.  FINDINGS: Lower chest: Small bilateral pleural effusions (left greater than right). Subsegmental atelectasis in the posterior aspect of the right lower lobe.  Hepatobiliary: No cystic or solid hepatic lesion identified. No intra or extrahepatic biliary ductal dilatation. High attenuation material filling the gallbladder, presumably vicarious excretion of contrast material from the recent chest CT.  Pancreas: No  pancreatic mass. No pancreatic ductal dilatation. No pancreatic or peripancreatic fluid or inflammatory changes.  Spleen: Unremarkable.  Adrenals/Urinary Tract: Bilateral adrenal glands and bilateral kidneys are unremarkable in appearance. No hydroureteronephrosis. Urinary bladder is unremarkable in appearance.  Stomach/Bowel: In the mid small bowel (like mid to distal jejunum) there is a profoundly asymmetrically thickened loop of bowel best demonstrated on image 56 of series 2, where the wall measures up to 22 mm in thickness medially. Adjacent to this there are soft tissue masses extending into the root of the small bowel mesentery, largest of which measures 2.7 x 3.3 cm (image 47 of series 2), likely malignant lymph nodes. The appendix is not confidently identified. No pathologic dilatation of small bowel or colon. Stomach is grossly normal in appearance.  Vascular/Lymphatic: No significant atherosclerotic disease, aneurysm or dissection identified in the abdominal or pelvic vasculature. Circumaortic left renal vein (normal anatomical variant) incidentally noted. Probable mesenteric lymphadenopathy, as discussed above.  Reproductive: Prostate gland and seminal vesicles are unremarkable in appearance. Inguinal canals and visualized portions of the scrotum were grossly unremarkable in appearance. No lymphadenopathy noted along the gonadal drainage system.  Other: Large amount of abnormal enhancing soft tissue throughout the peritoneal cavity, most notable in the omentum, particularly in the right upper quadrant where there is bulky omental caking. Small volume of presumably malignant ascites. No pneumoperitoneum. Some abnormal soft tissue is extending through the anterior abdominal wall into the periumbilical region (image 53 of series 2).  Musculoskeletal: There are no aggressive appearing lytic or blastic lesions noted in the visualized portions of the skeleton.  IMPRESSION: 1. Widespread intraperitoneal  malignancy, as demonstrated by extensive peritoneal nodularity and enhancement, widespread omental caking, likely malignant ascites and extensive mesenteric lymphadenopathy. The exact source of malignancy is uncertain, however, given the focally thickened loop of small bowel (likely jejunal) in the left side of the central abdomen (image 56 of series 2), a primary small bowel malignancy is suspected. 2. Small bilateral pleural effusions (left greater than right). Right-sided pleural effusion significantly decreased following thoracentesis. Resolving subsegmental atelectasis in the right lower lobe. 3. Additional incidental findings, as above. These results were called by telephone at the time of interpretation on 07/15/2015 at 5:33 pm to Dr. Olevia Bowens, who verbally acknowledged these results.   Electronically Signed   By: Quillian Quince  Entrikin M.D.   On: 07/15/2015 17:34   Nm Pet Image Initial (pi) Skull Base To Thigh  07/23/2015   CLINICAL DATA:  Initial treatment strategy for high-grade non-Hodgkin's lymphoma. B-cell lymphoma.  EXAM: NUCLEAR MEDICINE PET SKULL BASE TO THIGH  TECHNIQUE: 10.0 mCi F-18 FDG was injected intravenously. Full-ring PET imaging was performed from the skull base to thigh after the radiotracer. CT data was obtained and used for attenuation correction and anatomic localization.  FASTING BLOOD GLUCOSE:  Value: 88 mg/dl  COMPARISON:  CT abdomen 07/15/2015, CT thorax 07/14/2015  FINDINGS: NECK  Small hypermetabolic lymph nodes the just deep to the RIGHT clavicle measure 9 mm short axis on image 54, series 4 with intense metabolic activity (SUV max 6.2).  CHEST  Small hypermetabolic internal mammary lymph nodes on the RIGHT (image 66 with SUV max equal 5.). Small hypermetabolic nodule posterior to the sternum in the anterior mediastinum on image 69 of fused data set.  Within the precordial space anterior to the liver there is a 4.5 cm hypermetabolic mass. Smaller hypermetabolic nodule anterior to the  RIGHT ventricle.  ABDOMEN/PELVIS  There is a thin rim of intense metabolic activity associated entirety of the peritoneal surface consists with peritoneal metastasis / carcinomatosis. There is more focal activity within the LEFT abdomen associated with the small bowel wall thickening. This segmental thickening measures 17 mm in single wall thickness with intense metabolic activity (SUV max 14.6). Central mesenteric mass measures 3 cm with SUV max 13.2.  There are no hypermetabolic inguinal or iliac lymph nodes.  SKELETON  No focal hypermetabolic activity to suggest skeletal metastasis.  IMPRESSION: 1. Thin rim of hypermetabolic activity throughout the peritoneal space consistent with peritoneal carcinomatosis. 2. Focal intense metabolic activity associated with the proximal small bowel consistent with lymphoma involvement of bowel. 3. Hypermetabolic mesenteric nodule  /node consistent with lymphoma. 4. Several small hypermetabolic lymph nodes in the RIGHT supraclavicular and internal mammary nodal stations. 5. Nodal metastasis anterior to the liver in the precordial space. This may be the most assessable for biopsy. 6. Normal spleen and bone marrow.   Electronically Signed   By: Suzy Bouchard M.D.   On: 07/23/2015 11:35   US Biopsy  07/17/2015   CLINICAL DATA:  Extensive peritoneal nodularity and omental caking of uncertain etiology. Periumbilical masses.  EXAM: ULTRASOUND-GUIDED CORE PERIUMBILICAL BIOPSY  TECHNIQUE: An ultrasound guided biopsy was thoroughly discussed with the patient and questions were answered. The benefits, risks, alternatives, and complications were also discussed. The patient understands and wishes to proceed with the procedure. A verbal as well as written consent was obtained.  Survey ultrasound of the periumbilical region was performed and an appropriate skin entry site was determined. Skin site was marked, prepped with chlorhexidine, and draped in usual sterile fashion, and infiltrated  locally with 1% lidocaine.  Intravenous Fentanyl and Versed were administered as conscious sedation during continuous cardiorespiratory monitoring by the radiology RN, with a total moderate sedation time of less than 30 minutes.  Under real-time ultrasound guidance, a 17 gauge trocar needle was advanced to the margin of the lesion. Once needle tip position was confirmed, coaxial 18-gauge core biopsy samples were obtained, submitted in saline to surgical pathology. The guide needle was removed. Postprocedure scans show no hemorrhage or other apparent complication.  COMPLICATIONS: COMPLICATIONS None immediate  FINDINGS: Nodular periumbilical subcutaneous masses were localized. Core biopsy samples were obtained without complication.  IMPRESSION: 1. Technically successful ultrasound guided core periumbilical nodule biopsy.   Electronically Signed  By: Lucrezia Europe M.D.   On: 07/17/2015 16:15   US Paracentesis  07/17/2015   CLINICAL DATA:  Omental disease and peritoneal nodules.  EXAM: ULTRASOUND GUIDED PARACENTESIS  TECHNIQUE: The procedure, risks (including but not limited to bleeding, infection, organ damage ), benefits, and alternatives were explained to the patient. Questions regarding the procedure were encouraged and answered. The patient understands and consents to the procedure. Survey ultrasound of the abdomen was performed and an appropriate skin entry site in the right lower abdomen was selected. Skin site was marked, prepped with Betadine, and draped in usual sterile fashion, and infiltrated locally with 1% lidocaine. A 5 French multisidehole Yueh sheath needle was advanced into the peritoneal space until fluid could be aspirated. The sheath was advanced and the needle removed. 1.2 L of cloudy yellow ascites were aspirated. A sample sent to cytology for the requested studies.  COMPLICATIONS: COMPLICATIONS none  IMPRESSION: Technically successful ultrasound guided paracentesis, removing 1.2 L of ascites.  Sample to cytology.   Electronically Signed   By: Lucrezia Europe M.D.   On: 07/17/2015 16:17   Ir Fluoro Guide Cv Line Right  07/27/2015   CLINICAL DATA:  New diagnosis of Burkitt's lymphoma. The patient requires a porta cath for chemotherapy.  EXAM: IMPLANTED PORT A CATH PLACEMENT WITH ULTRASOUND AND FLUOROSCOPIC GUIDANCE  ANESTHESIA/SEDATION: 6.0 Mg IV Versed; 150 mcg IV Fentanyl  Total Moderate Sedation Time:  30 minutes  Additional Medications: 2 g IV Ancef. As antibiotic prophylaxis, Ancef was ordered pre-procedure and administered intravenously within one hour of incision.  FLUOROSCOPY TIME:  24 seconds.  PROCEDURE: The procedure, risks, benefits, and alternatives were explained to the patient. Questions regarding the procedure were encouraged and answered. The patient understands and consents to the procedure. A time-out was performed prior to the procedure.  Ultrasound was used to confirm patency of the right internal jugular vein. The right neck and chest were prepped with chlorhexidine in a sterile fashion, and a sterile drape was applied covering the operative field. Maximum barrier sterile technique with sterile gowns and gloves were used for the procedure. Local anesthesia was provided with 1% lidocaine.  After creating a small venotomy incision, a 21 gauge needle was advanced into the right internal jugular vein under direct, real-time ultrasound guidance. Ultrasound image documentation was performed. After securing guidewire access, an 8 Fr dilator was placed. A J-wire was kinked to measure appropriate catheter length.  A subcutaneous port pocket was then created along the upper chest wall utilizing sharp and blunt dissection. Portable cautery was utilized. The pocket was irrigated with sterile saline.  A single lumen power injectable port was chosen for placement. The 8 Fr catheter was tunneled from the port pocket site to the venotomy incision. The port was placed in the pocket. External catheter was  trimmed to appropriate length based on guidewire measurement.  At the venotomy, an 8 Fr peel-away sheath was placed over a guidewire. The catheter was then placed through the sheath and the sheath removed. Final catheter positioning was confirmed and documented with a fluoroscopic spot image. The port was accessed with a needle and aspirated and flushed with heparinized saline. The needle was left in place for immediate use.  The venotomy and port pocket incisions were closed with subcutaneous 3-0 Monocryl and subcuticular 4-0 Vicryl. Dermabond was applied to both incisions.  COMPLICATIONS: None  FINDINGS: After catheter placement, the tip lies at the cavoatrial junction. The catheter aspirates normally and is ready for immediate use.  IMPRESSION: Placement of single lumen port a cath via right internal jugular vein. The catheter tip lies at the cavoatrial junction. A power injectable port a cath was placed and is ready for immediate use.   Electronically Signed   By: Aletta Edouard M.D.   On: 07/27/2015 15:28   Ir US Guide Vasc Access Right  07/27/2015   CLINICAL DATA:  New diagnosis of Burkitt's lymphoma. The patient requires a porta cath for chemotherapy.  EXAM: IMPLANTED PORT A CATH PLACEMENT WITH ULTRASOUND AND FLUOROSCOPIC GUIDANCE  ANESTHESIA/SEDATION: 6.0 Mg IV Versed; 150 mcg IV Fentanyl  Total Moderate Sedation Time:  30 minutes  Additional Medications: 2 g IV Ancef. As antibiotic prophylaxis, Ancef was ordered pre-procedure and administered intravenously within one hour of incision.  FLUOROSCOPY TIME:  24 seconds.  PROCEDURE: The procedure, risks, benefits, and alternatives were explained to the patient. Questions regarding the procedure were encouraged and answered. The patient understands and consents to the procedure. A time-out was performed prior to the procedure.  Ultrasound was used to confirm patency of the right internal jugular vein. The right neck and chest were prepped with chlorhexidine  in a sterile fashion, and a sterile drape was applied covering the operative field. Maximum barrier sterile technique with sterile gowns and gloves were used for the procedure. Local anesthesia was provided with 1% lidocaine.  After creating a small venotomy incision, a 21 gauge needle was advanced into the right internal jugular vein under direct, real-time ultrasound guidance. Ultrasound image documentation was performed. After securing guidewire access, an 8 Fr dilator was placed. A J-wire was kinked to measure appropriate catheter length.  A subcutaneous port pocket was then created along the upper chest wall utilizing sharp and blunt dissection. Portable cautery was utilized. The pocket was irrigated with sterile saline.  A single lumen power injectable port was chosen for placement. The 8 Fr catheter was tunneled from the port pocket site to the venotomy incision. The port was placed in the pocket. External catheter was trimmed to appropriate length based on guidewire measurement.  At the venotomy, an 8 Fr peel-away sheath was placed over a guidewire. The catheter was then placed through the sheath and the sheath removed. Final catheter positioning was confirmed and documented with a fluoroscopic spot image. The port was accessed with a needle and aspirated and flushed with heparinized saline. The needle was left in place for immediate use.  The venotomy and port pocket incisions were closed with subcutaneous 3-0 Monocryl and subcuticular 4-0 Vicryl. Dermabond was applied to both incisions.  COMPLICATIONS: None  FINDINGS: After catheter placement, the tip lies at the cavoatrial junction. The catheter aspirates normally and is ready for immediate use.  IMPRESSION: Placement of single lumen port a cath via right internal jugular vein. The catheter tip lies at the cavoatrial junction. A power injectable port a cath was placed and is ready for immediate use.   Electronically Signed   By: Aletta Edouard M.D.   On:  07/27/2015 15:28   Ct Biopsy  07/24/2015   CLINICAL DATA:  39 year old male with a new diagnosis of lymphoma.  EXAM: CT BIOPSY  Date: 07/24/2015  PROCEDURE: 1. CT guided bone marrow aspiration and core biopsy Interventional Radiologist:  Criselda Peaches, MD  ANESTHESIA/SEDATION: Moderate (conscious) sedation was used. 2.5 mg Versed, 100 mcg Fentanyl were administered intravenously. The patient's vital signs were monitored continuously by radiology nursing throughout the procedure.  Sedation Time: 9 minutes  FLUOROSCOPY TIME:  None  TECHNIQUE:  Informed consent was obtained from the patient following explanation of the procedure, risks, benefits and alternatives. The patient understands, agrees and consents for the procedure. All questions were addressed. A time out was performed.  The patient was positioned prone and noncontrast localization CT was performed of the pelvis to demonstrate the iliac marrow spaces.  Maximal barrier sterile technique utilized including caps, mask, sterile gowns, sterile gloves, large sterile drape, hand hygiene, and betadine prep.  Under sterile conditions and local anesthesia, an 11 gauge coaxial bone biopsy needle was advanced into the right iliac marrow space. Needle position was confirmed with CT imaging. Initially, bone marrow aspiration was performed. Next, the 11 gauge outer cannula was utilized to obtain a right iliac bone marrow core biopsy. Needle was removed. Hemostasis was obtained with compression. The patient tolerated the procedure well. Samples were prepared with the cytotechnologist. No immediate complications.  IMPRESSION: CT guided right iliac bone marrow aspiration and core biopsy. The On-Control drill was used.  Signed,  Criselda Peaches, MD  [facility]  [facility]   Electronically Signed   By: Jacqulynn Cadet M.D.   On: 07/24/2015 12:32   Dg Chest Port 1 View  07/22/2015   CLINICAL DATA:  Pulmonary  edema, hypoxia  EXAM: PORTABLE CHEST - 1 VIEW  COMPARISON:  07/21/2015  FINDINGS: Heart size is normal. Right-sided PICC line in place with tip over the cavoatrial junction. Hazy perihilar rate greater than left basilar airspace opacities are slightly decreased. Trace pleural effusions.  IMPRESSION: Interval slight decrease in hazy perihilar airspace opacities compatible with improving edema.   Electronically Signed   By: Conchita Paris M.D.   On: 07/22/2015 08:10   Dg Chest Port 1 View  07/21/2015   CLINICAL DATA:  Acute respiratory failure with hypoxemia. Nonsmoker.  EXAM: PORTABLE CHEST - 1 VIEW  COMPARISON:  07/20/2015.  FINDINGS: Cardiomediastinal silhouette appears within normal limits for size. Diffuse pulmonary opacities persist but are slightly improved consistent with resolving edema. Decreased BILATERAL pleural effusions. PICC line tip SVC. No pneumothorax.  IMPRESSION: Improving pulmonary edema.   Electronically Signed   By: Staci Righter M.D.   On: 07/21/2015 09:38   Dg Chest Port 1 View  07/20/2015   CLINICAL DATA:  Shortness of Breath  EXAM: PORTABLE CHEST - 1 VIEW  COMPARISON:  July 15, 2015  FINDINGS: There is alveolar edema throughout both lungs. There are small bilateral effusions. Heart is upper normal in size. There is mild pulmonary venous hypertension. No adenopathy.  IMPRESSION: Extensive edema. Question a degree of congestive heart failure versus noncardiogenic edema. Note that cardiac silhouette is unchanged compared to recent prior study. No adenopathy apparent.   Electronically Signed   By: Lowella Grip III M.D.   On: 07/20/2015 15:36   Dg Fluoro Guide Lumbar Puncture  07/24/2015   CLINICAL DATA:  Lymphoma  EXAM: DIAGNOSTIC LUMBAR PUNCTURE UNDER FLUOROSCOPIC GUIDANCE  FLUOROSCOPY TIME:  Radiation Exposure Index (as provided by the fluoroscopic device):  If the device does not provide the exposure index:  Fluoroscopy Time (in minutes and seconds):  13 seconds  Number of  Acquired Images:  0  PROCEDURE: Informed consent was obtained from the patient prior to the procedure, including potential complications of headache, allergy, and pain. With the patient prone, the lower back was prepped with Betadine. 1% Lidocaine was used for local anesthesia. Lumbar puncture was performed at the L3-4 level using a 22 gauge needle with return of clear CSF with  an opening pressure of 14 cm water. 12 ml of CSF were obtained for laboratory studies. The patient tolerated the procedure well and there were no apparent complications.  IMPRESSION: 1. Technically successful lumbar puncture. 12 cc of clear CSF were obtained for laboratory studies.   Electronically Signed   By: Kerby Moors M.D.   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 Patient has completed the first cycle of R -EPOCH as inpatient and tolerated it well. His LDH level is down from 1200s to 300. No evidence of ongoing tumor lysis on labs done today. Improving fatigue. Had bone pains with Neulasta shot as expected. No other prohibitive toxicities. Blood counts are stable today.  2) Post-operative hypoxic respiratory failure with CXR showing b/l infiltrates ?Fluid overload vs ARDS from tumor lysis syndrome triggered by anesthesia/tissue handing. TLS labs stable. Likely fluid overload. Resolved with some diuresis. X-ray today at follow-up shows no acute infiltrates or significant pleural effusions.  2) Spontaneous TLS - uric acid normalized with allopurinol. Some bump in LFts now improved.Tumor lysis syndrome is now resolved. Labs today are stable. We'll discontinue allopurinol.  3) AKI ?related to contrast/urate nephropathy less likely urinary obstruction. Resolved  4) Elevated  transaminases - ? Related to allopurinol. Resolving. Improving albumin levels.  5) Elevated free T4 - ? Related to his supplements vs ?thyroiditis vs central hyperthyroidism vs thyroid involvement by same neoplastic-cn process vs paraneoplastic process. 6) Dysguesia Plan -Patient counseled to continue focusing on his physical activity level and good oral intake . -Will plan to admit the patient on 08/15/2015 for a second cycle of EPOCH R  -Will plan to start CNS prophylaxis with intrathecal methotrexate from cycle 3 as per Christell Constant et al Alene Mires 2013. -Given prescription for pain medications for his abdominal pain that is keeping him up at night. -Bowel prophylaxis with senna S and MiraLAX. -Might use magnesium citrate if needed in addition. -Counseled to avoid suppositories -Again offered a referral to a university setting to consider clinical trials or other more aggressive treatment options. Patient is disinclined to pursue these. -We'll repeat PET/CT scan prior to the third cycle of treatment to assess response. -May discontinue allopurinol in the absence of further signs of tumor lysis. Hope to reduce cytopenias related to this and abnormal liver functions.   All of the patients questions were answered with apparent satisfaction. The patient knows to call the clinic with any problems, questions or concerns.  I spent 30 minutes counseling the patient face to face. The total time spent in the appointment was 40 minutes and more than 50% was on counseling and direct patient cares.    Sullivan Lone MD [place] AAHIVMS [facility]: [facility] Rhonda W Hematology/Oncology Physician [place]  (Office):       [phone] (Work cell):  [phone] (Fax):           [phone]

## 2015-08-10 ENCOUNTER — Other Ambulatory Visit: Payer: Self-pay | Admitting: Hematology

## 2015-08-13 LAB — FUNGUS CULTURE W SMEAR: Fungal Smear: NONE SEEN

## 2015-08-15 ENCOUNTER — Inpatient Hospital Stay (HOSPITAL_COMMUNITY)
Admission: AD | Admit: 2015-08-15 | Discharge: 2015-08-19 | DRG: 847 | Disposition: A | Discharge status: Home / Self Care | Payer: BLUE CROSS/BLUE SHIELD | Source: Ambulatory Visit | Attending: Hematology | Admitting: Hematology

## 2015-08-15 ENCOUNTER — Encounter (HOSPITAL_COMMUNITY): Payer: Self-pay

## 2015-08-15 DIAGNOSIS — Z881 Allergy status to other antibiotic agents status: Secondary | ICD-10-CM | POA: Diagnosis not present

## 2015-08-15 DIAGNOSIS — Z5111 Encounter for antineoplastic chemotherapy: Principal | ICD-10-CM

## 2015-08-15 DIAGNOSIS — Z882 Allergy status to sulfonamides status: Secondary | ICD-10-CM

## 2015-08-15 DIAGNOSIS — Z8249 Family history of ischemic heart disease and other diseases of the circulatory system: Secondary | ICD-10-CM | POA: Diagnosis not present

## 2015-08-15 DIAGNOSIS — C8370 Burkitt lymphoma, unspecified site: Secondary | ICD-10-CM

## 2015-08-15 DIAGNOSIS — C8378 Burkitt lymphoma, lymph nodes of multiple sites: Secondary | ICD-10-CM

## 2015-08-15 HISTORY — DX: Burkitt lymphoma, unspecified site: C83.70

## 2015-08-15 HISTORY — DX: Adverse effect of unspecified anesthetic, initial encounter: T41.45XA

## 2015-08-15 HISTORY — DX: Other complications of anesthesia, initial encounter: T88.59XA

## 2015-08-15 LAB — COMPREHENSIVE METABOLIC PANEL
ALBUMIN: 3.5 g/dL (ref 3.5–5.0)
ALK PHOS: 77 U/L (ref 38–126)
ALT: 48 U/L (ref 17–63)
ANION GAP: 3 — AB (ref 5–15)
AST: 25 U/L (ref 15–41)
BILIRUBIN TOTAL: 0.4 mg/dL (ref 0.3–1.2)
BUN: 11 mg/dL (ref 6–20)
CALCIUM: 8.5 mg/dL — AB (ref 8.9–10.3)
CO2: 28 mmol/L (ref 22–32)
Chloride: 107 mmol/L (ref 101–111)
Creatinine, Ser: 0.81 mg/dL (ref 0.61–1.24)
GLUCOSE: 92 mg/dL (ref 65–99)
POTASSIUM: 4 mmol/L (ref 3.5–5.1)
Sodium: 138 mmol/L (ref 135–145)
TOTAL PROTEIN: 5.6 g/dL — AB (ref 6.5–8.1)

## 2015-08-15 LAB — TSH: TSH: 5.411 u[IU]/mL — AB (ref 0.350–4.500)

## 2015-08-15 LAB — CBC WITH DIFFERENTIAL/PLATELET
BASOS PCT: 0 %
Basophils Absolute: 0.1 10*3/uL (ref 0.0–0.1)
EOS ABS: 0 10*3/uL (ref 0.0–0.7)
EOS PCT: 0 %
HCT: 34 % — ABNORMAL LOW (ref 39.0–52.0)
HEMOGLOBIN: 11.2 g/dL — AB (ref 13.0–17.0)
LYMPHS ABS: 2.1 10*3/uL (ref 0.7–4.0)
Lymphocytes Relative: 13 %
MCH: 28.9 pg (ref 26.0–34.0)
MCHC: 32.9 g/dL (ref 30.0–36.0)
MCV: 87.9 fL (ref 78.0–100.0)
Monocytes Absolute: 1 10*3/uL (ref 0.1–1.0)
Monocytes Relative: 6 %
NEUTROS PCT: 81 %
Neutro Abs: 13 10*3/uL — ABNORMAL HIGH (ref 1.7–7.7)
PLATELETS: 251 10*3/uL (ref 150–400)
RBC: 3.87 MIL/uL — AB (ref 4.22–5.81)
RDW: 13.3 % (ref 11.5–15.5)
WBC: 16.2 10*3/uL — AB (ref 4.0–10.5)

## 2015-08-15 LAB — RETICULOCYTES
RBC.: 3.87 MIL/uL — ABNORMAL LOW (ref 4.22–5.81)
RETIC COUNT ABSOLUTE: 38.7 10*3/uL (ref 19.0–186.0)
RETIC CT PCT: 1 % (ref 0.4–3.1)

## 2015-08-15 LAB — PHOSPHORUS: PHOSPHORUS: 3.8 mg/dL (ref 2.5–4.6)

## 2015-08-15 LAB — LACTATE DEHYDROGENASE: LDH: 188 U/L (ref 98–192)

## 2015-08-15 LAB — URIC ACID: Uric Acid, Serum: 5.3 mg/dL (ref 4.4–7.6)

## 2015-08-15 LAB — MAGNESIUM: MAGNESIUM: 1.9 mg/dL (ref 1.7–2.4)

## 2015-08-15 MED ORDER — HEPARIN SOD (PORK) LOCK FLUSH 100 UNIT/ML IV SOLN: 250.0000 [IU] | Freq: Once | INTRAVENOUS | Status: DC | PRN

## 2015-08-15 MED ORDER — SODIUM CHLORIDE 0.9 % IV SOLN
8.0000 mg | Freq: Once | INTRAVENOUS | Status: AC
  Administered 2015-08-15: 8 mg via INTRAVENOUS
  Filled 2015-08-15: qty 4

## 2015-08-15 MED ORDER — ACETAMINOPHEN 325 MG PO TABS: 650.0000 mg | ORAL_TABLET | ORAL | Status: DC | PRN

## 2015-08-15 MED ORDER — OXYCODONE HCL 5 MG PO TABS: 5.0000 mg | ORAL_TABLET | Freq: Four times a day (QID) | ORAL | Status: DC | PRN

## 2015-08-15 MED ORDER — SENNOSIDES-DOCUSATE SODIUM 8.6-50 MG PO TABS
2.0000 | ORAL_TABLET | Freq: Two times a day (BID) | ORAL | Status: DC
  Filled 2015-08-15 (×2): qty 2

## 2015-08-15 MED ORDER — PREDNISONE 50 MG PO TABS
120.0000 mg | ORAL_TABLET | Freq: Every day | ORAL | Status: AC
  Administered 2015-08-15 – 2015-08-19 (×5): 120 mg via ORAL
  Filled 2015-08-15: qty 2
  Filled 2015-08-15 (×2): qty 1
  Filled 2015-08-15 (×2): qty 2
  Filled 2015-08-15: qty 1
  Filled 2015-08-15 (×2): qty 2
  Filled 2015-08-15 (×2): qty 1

## 2015-08-15 MED ORDER — ALUM & MAG HYDROXIDE-SIMETH 200-200-20 MG/5ML PO SUSP: 60.0000 mL | ORAL | Status: DC | PRN

## 2015-08-15 MED ORDER — SODIUM CHLORIDE 0.9 % IJ SOLN: 10.0000 mL | INTRAMUSCULAR | Status: DC | PRN

## 2015-08-15 MED ORDER — SODIUM CHLORIDE 0.9 % IJ SOLN: 3.0000 mL | INTRAMUSCULAR | Status: DC | PRN

## 2015-08-15 MED ORDER — SODIUM CHLORIDE 0.9 % IV SOLN
INTRAVENOUS | Status: DC
  Administered 2015-08-15 – 2015-08-17 (×2): via INTRAVENOUS

## 2015-08-15 MED ORDER — HEPARIN SOD (PORK) LOCK FLUSH 100 UNIT/ML IV SOLN
500.0000 [IU] | Freq: Once | INTRAVENOUS | Status: DC | PRN
  Filled 2015-08-15: qty 5

## 2015-08-15 MED ORDER — HOT PACK MISC ONCOLOGY: 1.0000 | Freq: Once | Status: DC | PRN

## 2015-08-15 MED ORDER — COLD PACK MISC ONCOLOGY: 1.0000 | Freq: Once | Status: DC | PRN

## 2015-08-15 MED ORDER — SODIUM BICARBONATE/SODIUM CHLORIDE MOUTHWASH
1.0000 "application " | Freq: Four times a day (QID) | OROMUCOSAL | Status: DC
  Administered 2015-08-15 – 2015-08-19 (×16): 1 via OROMUCOSAL
  Filled 2015-08-15: qty 1000

## 2015-08-15 MED ORDER — VINCRISTINE SULFATE CHEMO INJECTION 1 MG/ML
Freq: Once | INTRAVENOUS | Status: AC
  Administered 2015-08-15: 15:00:00 via INTRAVENOUS
  Filled 2015-08-15: qty 11

## 2015-08-15 MED ORDER — ALTEPLASE 2 MG IJ SOLR: 2.0000 mg | Freq: Once | INTRAMUSCULAR | Status: DC | PRN

## 2015-08-15 NOTE — Progress Notes (Signed)
Patient's BSA and chemo dosages and dilutions verified with 2nd RN Reyne Dumas for Adriamycin, Vepesid and Oncovin Drip.

## 2015-08-15 NOTE — H&P (Signed)
[name]    HEMATOLOGY/ONCOLOGY H&P NOTE  Date of Service: 08/15/2015  Patient Care Team: Doe-Hyun Donald Searles, DO as PCP - General (Internal Medicine)  CHIEF COMPLAINTS/PURPOSE OF CONSULTATION:  Burkitt's lymphoma admitted for cycle 2 R-EPOCH  HISTORY OF PRESENTING ILLNESS:  Donald [name] is a wonderful 39 y.o. 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 He presented with a large right-sided pleural effusion and abdominal adenopathy, omental nodules involvement. Lumbar puncture was negative. The bone marrow was negative.  He tolerated first cycle of EPOCH-R very well with no immediate acute toxicities. He had some expected body aches from his Neulasta shot.  No significant neutropenia or other hematologic toxicities with this first cycle.  Chest x-ray after the first cycle on followup noted resolution of pleural effusion.  Had some issues with constipation requiring scheduled laxatives.  Overall doing much better. Presents today for his scheduled admission to start the second cycle of EPOCH-R. Labs today looks stable.  He has no other acute concerns at this time.  He notes that he is considering using high-dose IV vitamin C as one of the alternative medicine treatments a few weeks after his chemotherapy.  We discussed that there isn't a clear understanding of how the center axis chemotherapy are affects his lymphoma.  Patient appreciates this understanding and will be thinking about whether he would want to do this.  He has done this in the past and tolerated it well with no acute concerns.  His fiance is at bedside and so is his mother.  MEDICAL HISTORY:  Past Medical History  Diagnosis Date  . EBV infection 2013    Patient notes that he had an EBV infection in 2013 that left him with chronic fatigue. She also notes that he had significant lymphadenopathy and thyroiditis at the time. He has been managing his symptoms with an alternative medicine  practitioner in [place] and with other alternative medicines.  . Marijuana use, episodic   . Complication of anesthesia   . Burkitt lymphoma ([place]) 08/15/2015    SURGICAL HISTORY: Past Surgical History  Procedure Laterality Date  . Tonsillectomy    . Appendectomy    . Laparoscopy N/A 07/20/2015    Procedure: LAPAROSCOPY DIAGNOSTIC, MULTIPLE BIOPSIES, DRAINAGE OF ASCITES;  Surgeon: Fanny Skates, MD;  Location: [facility];  Service: General;  Laterality: N/A;    SOCIAL HISTORY: Social History   Social History  . Marital Status: Married    Spouse Name: N/A  . Number of Children: N/A  . Years of Education: N/A   Occupational History  . Not on file.   Social History Main Topics  . Smoking status: Never Smoker   . Smokeless tobacco: Never Used  . Alcohol Use: No  . Drug Use: 1.00 per week    Special: Marijuana  . Sexual Activity: Yes   Other Topics Concern  . 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   He has a fiance and has been in a steady relationship.      He has tried to maintain a very healthy lifestyle and cycles about 100 miles a week. He also uses a fair number of over-the-counter alternative medicines to stay healthy.    FAMILY HISTORY: Family History  Problem Relation Age of Onset  . Heart failure Father     ALLERGIES:  is allergic to levaquin and sulfa antibiotics.  MEDICATIONS:  Current Facility-Administered Medications  Medication Dose Route Frequency Provider Last Rate Last Dose  . 0.9 %  sodium chloride infusion   Intravenous Continuous Brunetta Genera, MD 20 mL/hr at 08/15/15 1257    . acetaminophen (TYLENOL) tablet 650 mg  650 mg Oral Q4H PRN Brunetta Genera, MD      . alteplase (CATHFLO ACTIVASE) injection 2 mg  2 mg Intracatheter Once PRN Brunetta Genera, MD      . alum & mag hydroxide-simeth (MAALOX/MYLANTA) 200-200-20 MG/5ML suspension  60 mL  60 mL Oral Q4H PRN Zienna Ahlin Juleen China, MD      . Cold Pack 1 packet  1 packet Topical Once PRN Brunetta Genera, MD      . DOXOrubicin (ADRIAMYCIN) 22 mg, etoposide (VEPESID) 106 mg, vinCRIStine (ONCOVIN) 0.9 mg in sodium chloride 0.9 % 500 mL chemo infusion   Intravenous Once Brunetta Genera, MD 24 mL/hr at 08/15/15 1455    . heparin lock flush 100 unit/mL  500 Units Intracatheter Once PRN Brunetta Genera, MD      . heparin lock flush 100 unit/mL  250 Units Intracatheter Once PRN Brunetta Genera, MD      . Hot Pack 1 packet  1 packet Topical Once PRN Brunetta Genera, MD      . oxyCODONE (Oxy IR/ROXICODONE) immediate release tablet 5-10 mg  5-10 mg Oral Q6H PRN Brunetta Genera, MD      . predniSONE (DELTASONE) tablet 120 mg  120 mg Oral QAC breakfast Brunetta Genera, MD   120 mg at 08/15/15 1444  . senna-docusate (Senokot-S) tablet 2 tablet  2 tablet Oral BID Brunetta Genera, MD      . sodium bicarbonate/sodium chloride mouthwash [id number]  1 application Mouth Rinse QID Brunetta Genera, MD   1 application at 67/61/95 1447  . sodium chloride 0.9 % injection 10 mL  10 mL Intracatheter PRN Brunetta Genera, MD      . sodium chloride 0.9 % injection 3 mL  3 mL Intravenous PRN Suzan Slick Juleen China, MD        REVIEW OF SYSTEMS:    10 Point review of Systems was done is negative except as noted above.  PHYSICAL EXAMINATION: ECOG PERFORMANCE STATUS: 1 - Symptomatic but completely ambulatory  .@VITALS2 @ Filed Weights   08/15/15 0958  Weight: 181 lb 11.2 oz (82.419 kg)   .Body mass index is 25.35 kg/(m^2).  GENERAL:alert, in no acute distress and comfortable SKIN: skin color, texture, turgor are normal, no rashes or significant lesions EYES: normal, conjunctiva are pink and non-injected, sclera clear OROPHARYNX:no exudate, no erythema and lips, buccal mucosa, and tongue normal  NECK: supple, no JVD, thyroid normal size, non-tender, without  nodularity LYMPH:  no palpable lymphadenopathy in the cervical, axillary or inguinal LUNGS: clear to auscultation with normal respiratory effort HEART: regular rate & rhythm,  no murmurs and no lower extremity edema ABDOMEN: abdomen soft, minimal generalized tenderness to palpation, no peritoneal signs, no guarding or rigidity or rebound, normoactive bowel sounds  Musculoskeletal: no cyanosis of digits and no clubbing  PSYCH: alert & oriented x 3 with fluent speech NEURO: no focal motor/sensory deficits  LABORATORY DATA:  I have reviewed the data as listed  . CBC Latest Ref Rng 08/15/2015 08/03/2015 07/30/2015  WBC 4.0 - 10.5 K/uL 16.2(H) 3.6(L) 6.9  Hemoglobin 13.0 - 17.0 g/dL 11.2(L) 13.1 12.0(L)  Hematocrit 39.0 - 52.0 % 34.0(L) 39.3 34.3(L)  Platelets 150 -  400 K/uL 251 168 258    . CMP Latest Ref Rng 08/15/2015 08/03/2015 07/30/2015  Glucose 65 - 99 mg/dL 92 86 94  BUN 6 - 20 mg/dL 11 17.2 22(H)  Creatinine 0.61 - 1.24 mg/dL 0.81 0.8 0.73  Sodium 135 - 145 mmol/L 138 139 138  Potassium 3.5 - 5.1 mmol/L 4.0 4.1 3.6  Chloride 101 - 111 mmol/L 107 - 102  CO2 22 - 32 mmol/L 28 30(H) 28  Calcium 8.9 - 10.3 mg/dL 8.5(L) 9.0 8.6(L)  Total Protein 6.5 - 8.1 g/dL 5.6(L) 6.3(L) 5.1(L)  Total Bilirubin 0.3 - 1.2 mg/dL 0.4 0.97 1.0  Alkaline Phos 38 - 126 U/L 77 90 46  AST 15 - 41 U/L 25 32 74(H)  ALT 17 - 63 U/L 48 114(H) 157(H)   Component     Latest Ref Rng 08/15/2015  Magnesium     1.7 - 2.4 mg/dL 1.9  Phosphorus     2.5 - 4.6 mg/dL 3.8  TSH     0.350 - 4.500 uIU/mL 5.411 (H)  LDH     98 - 192 U/L 188  Uric Acid, Serum     4.4 - 7.6 mg/dL 5.3    RADIOGRAPHIC STUDIES: I have personally reviewed the radiological images as listed and agreed with the findings in the report. Dg Chest 2 View  08/03/2015   CLINICAL DATA:  Shortness of Breath, history of Burkitt's lymphoma status post first cycle of chemotherapy  EXAM: CHEST - 2 VIEW  COMPARISON:  07/22/2015  FINDINGS: Cardiac  shadow is within normal limits. A right central venous port is seen in satisfactory position. The lungs are well aerated bilaterally. No focal infiltrate or sizable effusion is seen. No acute bony abnormality is noted.  IMPRESSION: No active disease.   Electronically Signed   By: Inez Catalina M.D.   On: 08/03/2015 14:19   Nm Pet Image Initial (pi) Skull Base To Thigh  07/23/2015   CLINICAL DATA:  Initial treatment strategy for high-grade non-Hodgkin's lymphoma. B-cell lymphoma.  EXAM: NUCLEAR MEDICINE PET SKULL BASE TO THIGH  TECHNIQUE: 10.0 mCi F-18 FDG was injected intravenously. Full-ring PET imaging was performed from the skull base to thigh after the radiotracer. CT data was obtained and used for attenuation correction and anatomic localization.  FASTING BLOOD GLUCOSE:  Value: 88 mg/dl  COMPARISON:  CT abdomen 07/15/2015, CT thorax 07/14/2015  FINDINGS: NECK  Small hypermetabolic lymph nodes the just deep to the RIGHT clavicle measure 9 mm short axis on image 54, series 4 with intense metabolic activity (SUV max 6.2).  CHEST  Small hypermetabolic internal mammary lymph nodes on the RIGHT (image 66 with SUV max equal 5.). Small hypermetabolic nodule posterior to the sternum in the anterior mediastinum on image 69 of fused data set.  Within the precordial space anterior to the liver there is a 4.5 cm hypermetabolic mass. Smaller hypermetabolic nodule anterior to the RIGHT ventricle.  ABDOMEN/PELVIS  There is a thin rim of intense metabolic activity associated entirety of the peritoneal surface consists with peritoneal metastasis / carcinomatosis. There is more focal activity within the LEFT abdomen associated with the small bowel wall thickening. This segmental thickening measures 17 mm in single wall thickness with intense metabolic activity (SUV max 14.6). Central mesenteric mass measures 3 cm with SUV max 13.2.  There are no hypermetabolic inguinal or iliac lymph nodes.  SKELETON  No focal hypermetabolic  activity to suggest skeletal metastasis.  IMPRESSION: 1. Thin rim of hypermetabolic activity throughout  the peritoneal space consistent with peritoneal carcinomatosis. 2. Focal intense metabolic activity associated with the proximal small bowel consistent with lymphoma involvement of bowel. 3. Hypermetabolic mesenteric nodule  /node consistent with lymphoma. 4. Several small hypermetabolic lymph nodes in the RIGHT supraclavicular and internal mammary nodal stations. 5. Nodal metastasis anterior to the liver in the precordial space. This may be the most assessable for biopsy. 6. Normal spleen and bone marrow.   Electronically Signed   By: Suzy Bouchard M.D.   On: 07/23/2015 11:35   US Biopsy  07/17/2015   CLINICAL DATA:  Extensive peritoneal nodularity and omental caking of uncertain etiology. Periumbilical masses.  EXAM: ULTRASOUND-GUIDED CORE PERIUMBILICAL BIOPSY  TECHNIQUE: An ultrasound guided biopsy was thoroughly discussed with the patient and questions were answered. The benefits, risks, alternatives, and complications were also discussed. The patient understands and wishes to proceed with the procedure. A verbal as well as written consent was obtained.  Survey ultrasound of the periumbilical region was performed and an appropriate skin entry site was determined. Skin site was marked, prepped with chlorhexidine, and draped in usual sterile fashion, and infiltrated locally with 1% lidocaine.  Intravenous Fentanyl and Versed were administered as conscious sedation during continuous cardiorespiratory monitoring by the radiology RN, with a total moderate sedation time of less than 30 minutes.  Under real-time ultrasound guidance, a 17 gauge trocar needle was advanced to the margin of the lesion. Once needle tip position was confirmed, coaxial 18-gauge core biopsy samples were obtained, submitted in saline to surgical pathology. The guide needle was removed. Postprocedure scans show no hemorrhage or other  apparent complication.  COMPLICATIONS: COMPLICATIONS None immediate  FINDINGS: Nodular periumbilical subcutaneous masses were localized. Core biopsy samples were obtained without complication.  IMPRESSION: 1. Technically successful ultrasound guided core periumbilical nodule biopsy.   Electronically Signed   By: Lucrezia Europe M.D.   On: 07/17/2015 16:15   US Paracentesis  07/17/2015   CLINICAL DATA:  Omental disease and peritoneal nodules.  EXAM: ULTRASOUND GUIDED PARACENTESIS  TECHNIQUE: The procedure, risks (including but not limited to bleeding, infection, organ damage ), benefits, and alternatives were explained to the patient. Questions regarding the procedure were encouraged and answered. The patient understands and consents to the procedure. Survey ultrasound of the abdomen was performed and an appropriate skin entry site in the right lower abdomen was selected. Skin site was marked, prepped with Betadine, and draped in usual sterile fashion, and infiltrated locally with 1% lidocaine. A 5 French multisidehole Yueh sheath needle was advanced into the peritoneal space until fluid could be aspirated. The sheath was advanced and the needle removed. 1.2 L of cloudy yellow ascites were aspirated. A sample sent to cytology for the requested studies.  COMPLICATIONS: COMPLICATIONS none  IMPRESSION: Technically successful ultrasound guided paracentesis, removing 1.2 L of ascites. Sample to cytology.   Electronically Signed   By: Lucrezia Europe M.D.   On: 07/17/2015 16:17   Ir Fluoro Guide Cv Line Right  07/27/2015   CLINICAL DATA:  New diagnosis of Burkitt's lymphoma. The patient requires a porta cath for chemotherapy.  EXAM: IMPLANTED PORT A CATH PLACEMENT WITH ULTRASOUND AND FLUOROSCOPIC GUIDANCE  ANESTHESIA/SEDATION: 6.0 Mg IV Versed; 150 mcg IV Fentanyl  Total Moderate Sedation Time:  30 minutes  Additional Medications: 2 g IV Ancef. As antibiotic prophylaxis, Ancef was ordered pre-procedure and administered  intravenously within one hour of incision.  FLUOROSCOPY TIME:  24 seconds.  PROCEDURE: The procedure, risks, benefits, and alternatives were explained to  the patient. Questions regarding the procedure were encouraged and answered. The patient understands and consents to the procedure. A time-out was performed prior to the procedure.  Ultrasound was used to confirm patency of the right internal jugular vein. The right neck and chest were prepped with chlorhexidine in a sterile fashion, and a sterile drape was applied covering the operative field. Maximum barrier sterile technique with sterile gowns and gloves were used for the procedure. Local anesthesia was provided with 1% lidocaine.  After creating a small venotomy incision, a 21 gauge needle was advanced into the right internal jugular vein under direct, real-time ultrasound guidance. Ultrasound image documentation was performed. After securing guidewire access, an 8 Fr dilator was placed. A J-wire was kinked to measure appropriate catheter length.  A subcutaneous port pocket was then created along the upper chest wall utilizing sharp and blunt dissection. Portable cautery was utilized. The pocket was irrigated with sterile saline.  A single lumen power injectable port was chosen for placement. The 8 Fr catheter was tunneled from the port pocket site to the venotomy incision. The port was placed in the pocket. External catheter was trimmed to appropriate length based on guidewire measurement.  At the venotomy, an 8 Fr peel-away sheath was placed over a guidewire. The catheter was then placed through the sheath and the sheath removed. Final catheter positioning was confirmed and documented with a fluoroscopic spot image. The port was accessed with a needle and aspirated and flushed with heparinized saline. The needle was left in place for immediate use.  The venotomy and port pocket incisions were closed with subcutaneous 3-0 Monocryl and subcuticular 4-0 Vicryl.  Dermabond was applied to both incisions.  COMPLICATIONS: None  FINDINGS: After catheter placement, the tip lies at the cavoatrial junction. The catheter aspirates normally and is ready for immediate use.  IMPRESSION: Placement of single lumen port a cath via right internal jugular vein. The catheter tip lies at the cavoatrial junction. A power injectable port a cath was placed and is ready for immediate use.   Electronically Signed   By: Aletta Edouard M.D.   On: 07/27/2015 15:28   Ir US Guide Vasc Access Right  07/27/2015   CLINICAL DATA:  New diagnosis of Burkitt's lymphoma. The patient requires a porta cath for chemotherapy.  EXAM: IMPLANTED PORT A CATH PLACEMENT WITH ULTRASOUND AND FLUOROSCOPIC GUIDANCE  ANESTHESIA/SEDATION: 6.0 Mg IV Versed; 150 mcg IV Fentanyl  Total Moderate Sedation Time:  30 minutes  Additional Medications: 2 g IV Ancef. As antibiotic prophylaxis, Ancef was ordered pre-procedure and administered intravenously within one hour of incision.  FLUOROSCOPY TIME:  24 seconds.  PROCEDURE: The procedure, risks, benefits, and alternatives were explained to the patient. Questions regarding the procedure were encouraged and answered. The patient understands and consents to the procedure. A time-out was performed prior to the procedure.  Ultrasound was used to confirm patency of the right internal jugular vein. The right neck and chest were prepped with chlorhexidine in a sterile fashion, and a sterile drape was applied covering the operative field. Maximum barrier sterile technique with sterile gowns and gloves were used for the procedure. Local anesthesia was provided with 1% lidocaine.  After creating a small venotomy incision, a 21 gauge needle was advanced into the right internal jugular vein under direct, real-time ultrasound guidance. Ultrasound image documentation was performed. After securing guidewire access, an 8 Fr dilator was placed. A J-wire was kinked to measure appropriate catheter  length.  A subcutaneous port pocket  was then created along the upper chest wall utilizing sharp and blunt dissection. Portable cautery was utilized. The pocket was irrigated with sterile saline.  A single lumen power injectable port was chosen for placement. The 8 Fr catheter was tunneled from the port pocket site to the venotomy incision. The port was placed in the pocket. External catheter was trimmed to appropriate length based on guidewire measurement.  At the venotomy, an 8 Fr peel-away sheath was placed over a guidewire. The catheter was then placed through the sheath and the sheath removed. Final catheter positioning was confirmed and documented with a fluoroscopic spot image. The port was accessed with a needle and aspirated and flushed with heparinized saline. The needle was left in place for immediate use.  The venotomy and port pocket incisions were closed with subcutaneous 3-0 Monocryl and subcuticular 4-0 Vicryl. Dermabond was applied to both incisions.  COMPLICATIONS: None  FINDINGS: After catheter placement, the tip lies at the cavoatrial junction. The catheter aspirates normally and is ready for immediate use.  IMPRESSION: Placement of single lumen port a cath via right internal jugular vein. The catheter tip lies at the cavoatrial junction. A power injectable port a cath was placed and is ready for immediate use.   Electronically Signed   By: Aletta Edouard M.D.   On: 07/27/2015 15:28   Ct Biopsy  07/24/2015   CLINICAL DATA:  39 year old male with a new diagnosis of lymphoma.  EXAM: CT BIOPSY  Date: 07/24/2015  PROCEDURE: 1. CT guided bone marrow aspiration and core biopsy Interventional Radiologist:  Criselda Peaches, MD  ANESTHESIA/SEDATION: Moderate (conscious) sedation was used. 2.5 mg Versed, 100 mcg Fentanyl were administered intravenously. The patient's vital signs were monitored continuously by radiology nursing throughout the procedure.  Sedation Time: 9 minutes  FLUOROSCOPY TIME:   None  TECHNIQUE: Informed consent was obtained from the patient following explanation of the procedure, risks, benefits and alternatives. The patient understands, agrees and consents for the procedure. All questions were addressed. A time out was performed.  The patient was positioned prone and noncontrast localization CT was performed of the pelvis to demonstrate the iliac marrow spaces.  Maximal barrier sterile technique utilized including caps, mask, sterile gowns, sterile gloves, large sterile drape, hand hygiene, and betadine prep.  Under sterile conditions and local anesthesia, an 11 gauge coaxial bone biopsy needle was advanced into the right iliac marrow space. Needle position was confirmed with CT imaging. Initially, bone marrow aspiration was performed. Next, the 11 gauge outer cannula was utilized to obtain a right iliac bone marrow core biopsy. Needle was removed. Hemostasis was obtained with compression. The patient tolerated the procedure well. Samples were prepared with the cytotechnologist. No immediate complications.  IMPRESSION: CT guided right iliac bone marrow aspiration and core biopsy. The On-Control drill was used.  Signed,  Criselda Peaches, MD  [facility]  [facility]   Electronically Signed   By: Jacqulynn Cadet M.D.   On: 07/24/2015 12:32   Dg Chest Port 1 View  07/22/2015   CLINICAL DATA:  Pulmonary edema, hypoxia  EXAM: PORTABLE CHEST - 1 VIEW  COMPARISON:  07/21/2015  FINDINGS: Heart size is normal. Right-sided PICC line in place with tip over the cavoatrial junction. Hazy perihilar rate greater than left basilar airspace opacities are slightly decreased. Trace pleural effusions.  IMPRESSION: Interval slight decrease in hazy perihilar airspace opacities compatible with improving edema.   Electronically Signed   By: Roena Malady.D.  On: 07/22/2015 08:10   Dg Chest Port 1 View  07/21/2015   CLINICAL DATA:  Acute respiratory  failure with hypoxemia. Nonsmoker.  EXAM: PORTABLE CHEST - 1 VIEW  COMPARISON:  07/20/2015.  FINDINGS: Cardiomediastinal silhouette appears within normal limits for size. Diffuse pulmonary opacities persist but are slightly improved consistent with resolving edema. Decreased BILATERAL pleural effusions. PICC line tip SVC. No pneumothorax.  IMPRESSION: Improving pulmonary edema.   Electronically Signed   By: Staci Righter M.D.   On: 07/21/2015 09:38   Dg Chest Port 1 View  07/20/2015   CLINICAL DATA:  Shortness of Breath  EXAM: PORTABLE CHEST - 1 VIEW  COMPARISON:  July 15, 2015  FINDINGS: There is alveolar edema throughout both lungs. There are small bilateral effusions. Heart is upper normal in size. There is mild pulmonary venous hypertension. No adenopathy.  IMPRESSION: Extensive edema. Question a degree of congestive heart failure versus noncardiogenic edema. Note that cardiac silhouette is unchanged compared to recent prior study. No adenopathy apparent.   Electronically Signed   By: Lowella Grip III M.D.   On: 07/20/2015 15:36   Dg Fluoro Guide Lumbar Puncture  07/24/2015   CLINICAL DATA:  Lymphoma  EXAM: DIAGNOSTIC LUMBAR PUNCTURE UNDER FLUOROSCOPIC GUIDANCE  FLUOROSCOPY TIME:  Radiation Exposure Index (as provided by the fluoroscopic device):  If the device does not provide the exposure index:  Fluoroscopy Time (in minutes and seconds):  13 seconds  Number of Acquired Images:  0  PROCEDURE: Informed consent was obtained from the patient prior to the procedure, including potential complications of headache, allergy, and pain. With the patient prone, the lower back was prepped with Betadine. 1% Lidocaine was used for local anesthesia. Lumbar puncture was performed at the L3-4 level using a 22 gauge needle with return of clear CSF with an opening pressure of 14 cm water. 12 ml of CSF were obtained for laboratory studies. The patient tolerated the procedure well and there were no apparent  complications.  IMPRESSION: 1. Technically successful lumbar puncture. 12 cc of clear CSF were obtained for laboratory studies.   Electronically Signed   By: Kerby Moors M.D.   On: 07/24/2015 12:31    ASSESSMENT & PLAN:   1) High risk Burkitt's lymphoma stage III with no CNS involvement. Cytogenetics showed Myc Break apart event associated with chromosomal variants of Burkitt's lymphoma t(2;8) and t(8;22). Patient has completed the first cycle of R -EPOCH as inpatient and tolerated it well. His LDH level is down from 1200s to 188. No evidence of ongoing tumor lysis on labs done today. Improved fatigue. Had bone pains with Neulasta shot as expected. No other prohibitive toxicities. Blood counts are stable today.  2)h/o previous Spontaneous TLS -current tumor lysis labs negative  3) History of AKI ?related to contrast/urate nephropathy less likely urinary obstruction. Resolved current renal function normal. 4) Elevated transaminases - ? Related to allopurinol. resolved 5) Elevated free T4 - ? Related to his supplements vs ?thyroiditis vs central hyperthyroidism vs thyroid involvement by same neoplastic-cn process vs paraneoplastic process.TSH borderline elevated. -We'll check free T4 tomorrow 6) Dysguesia Plan -lab stable and patient clinically doing well and appropriate to start second cycle of EPOCH R today -Will plan to start CNS prophylaxis with intrathecal methotrexate from cycle 3 as per Christell Constant et al NEJM 2013 on D1 and D5 as per their protocol. -We'll repeat PET/CT scan prior to the third cycle of treatment to assess response. -we'll hold off on allopurinol at this time. -  Daily CBC, CMP, uric acid. -Check a free T4 tomorrow morning. -we'll continue to follow daily. -Anticipated discharge in 5 days if no acute concerns. -would plan to see him 1 week after completion of second cycle of EPOCH-R. -Patient refuses DVT prophylaxis with Lovenox.  Would have been ambulate and use  SCDs when in bed. -Encourage good oral hydration. -Regular diet. -Provided supportive counseling.  All of the patients questions were answered with apparent satisfaction. The patient knows to call the clinic with any problems, questions or concerns. I spent 40 minutes counseling the patient face to face. The total time spent in the appointment was 60 minutes and more than 50% was on counseling and direct patient cares.    Sullivan Lone MD [place] AAHIVMS [facility] Hematology/Oncology Physician [facility]  (Office):       [phone] (Work cell):  [phone] (Fax):           [phone]  08/15/2015 4:24 PM

## 2015-08-16 LAB — COMPREHENSIVE METABOLIC PANEL
ALT: 43 U/L (ref 17–63)
AST: 26 U/L (ref 15–41)
Albumin: 3.4 g/dL — ABNORMAL LOW (ref 3.5–5.0)
Alkaline Phosphatase: 73 U/L (ref 38–126)
Anion gap: 6 (ref 5–15)
BILIRUBIN TOTAL: 0.6 mg/dL (ref 0.3–1.2)
BUN: 17 mg/dL (ref 6–20)
CHLORIDE: 108 mmol/L (ref 101–111)
CO2: 26 mmol/L (ref 22–32)
Calcium: 8.7 mg/dL — ABNORMAL LOW (ref 8.9–10.3)
Creatinine, Ser: 0.82 mg/dL (ref 0.61–1.24)
Glucose, Bld: 121 mg/dL — ABNORMAL HIGH (ref 65–99)
POTASSIUM: 4.1 mmol/L (ref 3.5–5.1)
Sodium: 140 mmol/L (ref 135–145)
TOTAL PROTEIN: 5.6 g/dL — AB (ref 6.5–8.1)

## 2015-08-16 LAB — CBC
HEMATOCRIT: 33.3 % — AB (ref 39.0–52.0)
Hemoglobin: 11.1 g/dL — ABNORMAL LOW (ref 13.0–17.0)
MCH: 29.2 pg (ref 26.0–34.0)
MCHC: 33.3 g/dL (ref 30.0–36.0)
MCV: 87.6 fL (ref 78.0–100.0)
PLATELETS: 306 10*3/uL (ref 150–400)
RBC: 3.8 MIL/uL — AB (ref 4.22–5.81)
RDW: 13 % (ref 11.5–15.5)
WBC: 22.7 10*3/uL — AB (ref 4.0–10.5)

## 2015-08-16 LAB — URIC ACID: URIC ACID, SERUM: 4.7 mg/dL (ref 4.4–7.6)

## 2015-08-16 LAB — T4, FREE: Free T4: 0.83 ng/dL (ref 0.61–1.12)

## 2015-08-16 MED ORDER — VINCRISTINE SULFATE CHEMO INJECTION 1 MG/ML
Freq: Once | INTRAVENOUS | Status: AC
  Administered 2015-08-16: 14:00:00 via INTRAVENOUS
  Filled 2015-08-16: qty 11

## 2015-08-16 MED ORDER — ONDANSETRON HCL 40 MG/20ML IJ SOLN
8.0000 mg | Freq: Once | INTRAMUSCULAR | Status: AC
  Administered 2015-08-16: 8 mg via INTRAVENOUS
  Filled 2015-08-16: qty 4

## 2015-08-16 MED ORDER — SENNOSIDES-DOCUSATE SODIUM 8.6-50 MG PO TABS: 2.0000 | ORAL_TABLET | Freq: Every evening | ORAL | Status: DC | PRN

## 2015-08-16 MED ORDER — POLYETHYLENE GLYCOL 3350 17 G PO PACK
17.0000 g | PACK | Freq: Every day | ORAL | Status: DC
  Administered 2015-08-15 – 2015-08-17 (×3): 17 g via ORAL
  Filled 2015-08-16 (×4): qty 1

## 2015-08-16 NOTE — Progress Notes (Cosign Needed)
Chemo calculations for adriamycin/ etoposide/ oncovin, done with Drue Dun RN.

## 2015-08-17 LAB — COMPREHENSIVE METABOLIC PANEL
ALK PHOS: 67 U/L (ref 38–126)
ALT: 41 U/L (ref 17–63)
ANION GAP: 6 (ref 5–15)
AST: 24 U/L (ref 15–41)
Albumin: 3.3 g/dL — ABNORMAL LOW (ref 3.5–5.0)
BILIRUBIN TOTAL: 0.6 mg/dL (ref 0.3–1.2)
BUN: 17 mg/dL (ref 6–20)
CALCIUM: 8.5 mg/dL — AB (ref 8.9–10.3)
CO2: 27 mmol/L (ref 22–32)
Chloride: 110 mmol/L (ref 101–111)
Creatinine, Ser: 0.89 mg/dL (ref 0.61–1.24)
GLUCOSE: 110 mg/dL — AB (ref 65–99)
Potassium: 3.8 mmol/L (ref 3.5–5.1)
Sodium: 143 mmol/L (ref 135–145)
TOTAL PROTEIN: 5.6 g/dL — AB (ref 6.5–8.1)

## 2015-08-17 LAB — CBC
HCT: 31.9 % — ABNORMAL LOW (ref 39.0–52.0)
Hemoglobin: 10.7 g/dL — ABNORMAL LOW (ref 13.0–17.0)
MCH: 29.2 pg (ref 26.0–34.0)
MCHC: 33.5 g/dL (ref 30.0–36.0)
MCV: 87.2 fL (ref 78.0–100.0)
Platelets: 307 10*3/uL (ref 150–400)
RBC: 3.66 MIL/uL — AB (ref 4.22–5.81)
RDW: 13.1 % (ref 11.5–15.5)
WBC: 17.8 10*3/uL — AB (ref 4.0–10.5)

## 2015-08-17 LAB — URIC ACID: URIC ACID, SERUM: 4.2 mg/dL — AB (ref 4.4–7.6)

## 2015-08-17 MED ORDER — SODIUM CHLORIDE 0.9 % IV SOLN
8.0000 mg | Freq: Once | INTRAVENOUS | Status: AC
Start: 2015-08-17 — End: 2015-08-17
  Administered 2015-08-17: 8 mg via INTRAVENOUS
  Filled 2015-08-17: qty 4

## 2015-08-17 MED ORDER — SODIUM CHLORIDE 0.9 % IV SOLN
8.0000 mg | Freq: Three times a day (TID) | INTRAVENOUS | Status: DC | PRN
  Filled 2015-08-17: qty 4

## 2015-08-17 MED ORDER — VINCRISTINE SULFATE CHEMO INJECTION 1 MG/ML
Freq: Once | INTRAVENOUS | Status: AC
  Administered 2015-08-17: 12:00:00 via INTRAVENOUS
  Filled 2015-08-17: qty 11

## 2015-08-17 NOTE — Progress Notes (Signed)
IV pump beeping. Patient did ring call bell but when entered room patient had channel door open and flicking on IV line. Told patient not to open door just to call for assistance. He said "Yeah". Fixed IV line and placed back into pump.

## 2015-08-17 NOTE — Progress Notes (Signed)
[name]   HEMATOLOGY/ONCOLOGY INPATIENT PROGRESS NOTE  Date of Service: 08/16/2015  Inpatient Attending: .Brunetta Genera, MD  SUBJECTIVE Patient was seen 10/6 AM. Notes no issues with the chemotherapy. In good spirits. Slept well overnight. Fiance at bedside.  OBJECTIVE:  NAD  PHYSICAL EXAMINATION: . Filed Vitals:   08/16/15 0441 08/16/15 2035 08/16/15 2108 08/17/15 0500  BP: 116/59 135/76  108/56  Pulse: 56 84 64 56  Temp: 98.3 F (36.8 C) 98.1 F (36.7 C)  98 F (36.7 C)  TempSrc: Oral Oral  Oral  Resp: 18 20  16   Height:      Weight: 178 lb 12.7 oz (81.1 kg)   178 lb 14.4 oz (81.149 kg)  SpO2: 98% 99%  99%   Filed Weights   08/15/15 0958 08/16/15 0441 08/17/15 0500  Weight: 181 lb 11.2 oz (82.419 kg) 178 lb 12.7 oz (81.1 kg) 178 lb 14.4 oz (81.149 kg)   .Body mass index is 24.96 kg/(m^2).   GENERAL:alert, in no acute distress and comfortable SKIN: skin color, texture, turgor are normal, no rashes or significant lesions EYES: normal, conjunctiva are pink and non-injected, sclera clear OROPHARYNX:no exudate, no erythema and lips, buccal mucosa, and tongue normal  NECK: supple, no JVD, thyroid normal size, non-tender, without nodularity LYMPH: no palpable lymphadenopathy in the cervical, axillary or inguinal LUNGS: clear to auscultation with normal respiratory effort HEART: regular rate & rhythm, no murmurs and no lower extremity edema ABDOMEN: abdomen soft, minimal generalized tenderness to palpation, no peritoneal signs, no guarding or rigidity or rebound, normoactive bowel sounds  Musculoskeletal: no cyanosis of digits and no clubbing  PSYCH: alert & oriented x 3 with fluent speech NEURO: no focal motor/sensory deficits  MEDICAL HISTORY:  Past Medical History  Diagnosis Date  . EBV infection 2013    Patient notes that he had an EBV infection in 2013 that left him with chronic fatigue. She also notes that he had significant lymphadenopathy and thyroiditis  at the time. He has been managing his symptoms with an alternative medicine practitioner in [place] and with other alternative medicines.  . Marijuana use, episodic   . Complication of anesthesia   . Burkitt lymphoma ([place]) 08/15/2015    SURGICAL HISTORY: Past Surgical History  Procedure Laterality Date  . Tonsillectomy    . Appendectomy    . Laparoscopy N/A 07/20/2015    Procedure: LAPAROSCOPY DIAGNOSTIC, MULTIPLE BIOPSIES, DRAINAGE OF ASCITES;  Surgeon: Fanny Skates, MD;  Location: [facility];  Service: General;  Laterality: N/A;    SOCIAL HISTORY: Social History   Social History  . Marital Status: Married    Spouse Name: N/A  . Number of Children: N/A  . Years of Education: N/A   Occupational History  . Not on file.   Social History Main Topics  . Smoking status: Never Smoker   . Smokeless tobacco: Never Used  . Alcohol Use: No  . Drug Use: 1.00 per week    Special: Marijuana  . Sexual Activity: Yes   Other Topics Concern  . 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   He has a fiance and has been in a steady relationship.      He has tried to maintain a very healthy lifestyle and cycles about 100 miles a week. He also uses a fair number of over-the-counter alternative medicines to stay healthy.    FAMILY HISTORY: Family  History  Problem Relation Age of Onset  . Heart failure Father     ALLERGIES:  is allergic to levaquin and sulfa antibiotics.  MEDICATIONS:  Scheduled Meds: . DOXOrubicin/vinCRIStine/etoposide CHEMO IV infusion for Inpatient CI   Intravenous Once  . polyethylene glycol  17 g Oral Daily  . predniSONE  120 mg Oral QAC breakfast  . sodium bicarbonate/sodium chloride  1 application Mouth Rinse QID   Continuous Infusions: . sodium chloride 20 mL/hr at 08/16/15 2108   PRN Meds:.acetaminophen, alteplase, alum & mag hydroxide-simeth, Cold Pack,  heparin lock flush, heparin lock flush, Hot Pack, oxyCODONE, senna-docusate, sodium chloride, sodium chloride  REVIEW OF SYSTEMS:    10 Point review of Systems was done is negative except as noted above.   LABORATORY DATA:  I have reviewed the data as listed  . CBC Latest Ref Rng 08/17/2015 08/16/2015 08/15/2015  WBC 4.0 - 10.5 K/uL 17.8(H) 22.7(H) 16.2(H)  Hemoglobin 13.0 - 17.0 g/dL 10.7(L) 11.1(L) 11.2(L)  Hematocrit 39.0 - 52.0 % 31.9(L) 33.3(L) 34.0(L)  Platelets 150 - 400 K/uL 307 306 251    . CMP Latest Ref Rng 08/17/2015 08/16/2015 08/15/2015  Glucose 65 - 99 mg/dL 110(H) 121(H) 92  BUN 6 - 20 mg/dL 17 17 11   Creatinine 0.61 - 1.24 mg/dL 0.89 0.82 0.81  Sodium 135 - 145 mmol/L 143 140 138  Potassium 3.5 - 5.1 mmol/L 3.8 4.1 4.0  Chloride 101 - 111 mmol/L 110 108 107  CO2 22 - 32 mmol/L 27 26 28   Calcium 8.9 - 10.3 mg/dL 8.5(L) 8.7(L) 8.5(L)  Total Protein 6.5 - 8.1 g/dL 5.6(L) 5.6(L) 5.6(L)  Total Bilirubin 0.3 - 1.2 mg/dL 0.6 0.6 0.4  Alkaline Phos 38 - 126 U/L 67 73 77  AST 15 - 41 U/L 24 26 25   ALT 17 - 63 U/L 41 43 48     RADIOGRAPHIC STUDIES: I have personally reviewed the radiological images as listed and agreed with the findings in the report. Dg Chest 2 View  08/03/2015   CLINICAL DATA:  Shortness of Breath, history of Burkitt's lymphoma status post first cycle of chemotherapy  EXAM: CHEST - 2 VIEW  COMPARISON:  07/22/2015  FINDINGS: Cardiac shadow is within normal limits. A right central venous port is seen in satisfactory position. The lungs are well aerated bilaterally. No focal infiltrate or sizable effusion is seen. No acute bony abnormality is noted.  IMPRESSION: No active disease.   Electronically Signed   By: Inez Catalina M.D.   On: 08/03/2015 14:19   Nm Pet Image Initial (pi) Skull Base To Thigh  07/23/2015   CLINICAL DATA:  Initial treatment strategy for high-grade non-Hodgkin's lymphoma. B-cell lymphoma.  EXAM: NUCLEAR MEDICINE PET SKULL BASE TO THIGH   TECHNIQUE: 10.0 mCi F-18 FDG was injected intravenously. Full-ring PET imaging was performed from the skull base to thigh after the radiotracer. CT data was obtained and used for attenuation correction and anatomic localization.  FASTING BLOOD GLUCOSE:  Value: 88 mg/dl  COMPARISON:  CT abdomen 07/15/2015, CT thorax 07/14/2015  FINDINGS: NECK  Small hypermetabolic lymph nodes the just deep to the RIGHT clavicle measure 9 mm short axis on image 54, series 4 with intense metabolic activity (SUV max 6.2).  CHEST  Small hypermetabolic internal mammary lymph nodes on the RIGHT (image 66 with SUV max equal 5.). Small hypermetabolic nodule posterior to the sternum in the anterior mediastinum on image 69 of fused data set.  Within the precordial space anterior to the liver  there is a 4.5 cm hypermetabolic mass. Smaller hypermetabolic nodule anterior to the RIGHT ventricle.  ABDOMEN/PELVIS  There is a thin rim of intense metabolic activity associated entirety of the peritoneal surface consists with peritoneal metastasis / carcinomatosis. There is more focal activity within the LEFT abdomen associated with the small bowel wall thickening. This segmental thickening measures 17 mm in single wall thickness with intense metabolic activity (SUV max 14.6). Central mesenteric mass measures 3 cm with SUV max 13.2.  There are no hypermetabolic inguinal or iliac lymph nodes.  SKELETON  No focal hypermetabolic activity to suggest skeletal metastasis.  IMPRESSION: 1. Thin rim of hypermetabolic activity throughout the peritoneal space consistent with peritoneal carcinomatosis. 2. Focal intense metabolic activity associated with the proximal small bowel consistent with lymphoma involvement of bowel. 3. Hypermetabolic mesenteric nodule  /node consistent with lymphoma. 4. Several small hypermetabolic lymph nodes in the RIGHT supraclavicular and internal mammary nodal stations. 5. Nodal metastasis anterior to the liver in the precordial space.  This may be the most assessable for biopsy. 6. Normal spleen and bone marrow.   Electronically Signed   By: Suzy Bouchard M.D.   On: 07/23/2015 11:35   Ir Fluoro Guide Cv Line Right  07/27/2015   CLINICAL DATA:  New diagnosis of Burkitt's lymphoma. The patient requires a porta cath for chemotherapy.  EXAM: IMPLANTED PORT A CATH PLACEMENT WITH ULTRASOUND AND FLUOROSCOPIC GUIDANCE  ANESTHESIA/SEDATION: 6.0 Mg IV Versed; 150 mcg IV Fentanyl  Total Moderate Sedation Time:  30 minutes  Additional Medications: 2 g IV Ancef. As antibiotic prophylaxis, Ancef was ordered pre-procedure and administered intravenously within one hour of incision.  FLUOROSCOPY TIME:  24 seconds.  PROCEDURE: The procedure, risks, benefits, and alternatives were explained to the patient. Questions regarding the procedure were encouraged and answered. The patient understands and consents to the procedure. A time-out was performed prior to the procedure.  Ultrasound was used to confirm patency of the right internal jugular vein. The right neck and chest were prepped with chlorhexidine in a sterile fashion, and a sterile drape was applied covering the operative field. Maximum barrier sterile technique with sterile gowns and gloves were used for the procedure. Local anesthesia was provided with 1% lidocaine.  After creating a small venotomy incision, a 21 gauge needle was advanced into the right internal jugular vein under direct, real-time ultrasound guidance. Ultrasound image documentation was performed. After securing guidewire access, an 8 Fr dilator was placed. A J-wire was kinked to measure appropriate catheter length.  A subcutaneous port pocket was then created along the upper chest wall utilizing sharp and blunt dissection. Portable cautery was utilized. The pocket was irrigated with sterile saline.  A single lumen power injectable port was chosen for placement. The 8 Fr catheter was tunneled from the port pocket site to the venotomy  incision. The port was placed in the pocket. External catheter was trimmed to appropriate length based on guidewire measurement.  At the venotomy, an 8 Fr peel-away sheath was placed over a guidewire. The catheter was then placed through the sheath and the sheath removed. Final catheter positioning was confirmed and documented with a fluoroscopic spot image. The port was accessed with a needle and aspirated and flushed with heparinized saline. The needle was left in place for immediate use.  The venotomy and port pocket incisions were closed with subcutaneous 3-0 Monocryl and subcuticular 4-0 Vicryl. Dermabond was applied to both incisions.  COMPLICATIONS: None  FINDINGS: After catheter placement, the tip lies at  the cavoatrial junction. The catheter aspirates normally and is ready for immediate use.  IMPRESSION: Placement of single lumen port a cath via right internal jugular vein. The catheter tip lies at the cavoatrial junction. A power injectable port a cath was placed and is ready for immediate use.   Electronically Signed   By: Aletta Edouard M.D.   On: 07/27/2015 15:28   Ir US Guide Vasc Access Right  07/27/2015   CLINICAL DATA:  New diagnosis of Burkitt's lymphoma. The patient requires a porta cath for chemotherapy.  EXAM: IMPLANTED PORT A CATH PLACEMENT WITH ULTRASOUND AND FLUOROSCOPIC GUIDANCE  ANESTHESIA/SEDATION: 6.0 Mg IV Versed; 150 mcg IV Fentanyl  Total Moderate Sedation Time:  30 minutes  Additional Medications: 2 g IV Ancef. As antibiotic prophylaxis, Ancef was ordered pre-procedure and administered intravenously within one hour of incision.  FLUOROSCOPY TIME:  24 seconds.  PROCEDURE: The procedure, risks, benefits, and alternatives were explained to the patient. Questions regarding the procedure were encouraged and answered. The patient understands and consents to the procedure. A time-out was performed prior to the procedure.  Ultrasound was used to confirm patency of the right internal  jugular vein. The right neck and chest were prepped with chlorhexidine in a sterile fashion, and a sterile drape was applied covering the operative field. Maximum barrier sterile technique with sterile gowns and gloves were used for the procedure. Local anesthesia was provided with 1% lidocaine.  After creating a small venotomy incision, a 21 gauge needle was advanced into the right internal jugular vein under direct, real-time ultrasound guidance. Ultrasound image documentation was performed. After securing guidewire access, an 8 Fr dilator was placed. A J-wire was kinked to measure appropriate catheter length.  A subcutaneous port pocket was then created along the upper chest wall utilizing sharp and blunt dissection. Portable cautery was utilized. The pocket was irrigated with sterile saline.  A single lumen power injectable port was chosen for placement. The 8 Fr catheter was tunneled from the port pocket site to the venotomy incision. The port was placed in the pocket. External catheter was trimmed to appropriate length based on guidewire measurement.  At the venotomy, an 8 Fr peel-away sheath was placed over a guidewire. The catheter was then placed through the sheath and the sheath removed. Final catheter positioning was confirmed and documented with a fluoroscopic spot image. The port was accessed with a needle and aspirated and flushed with heparinized saline. The needle was left in place for immediate use.  The venotomy and port pocket incisions were closed with subcutaneous 3-0 Monocryl and subcuticular 4-0 Vicryl. Dermabond was applied to both incisions.  COMPLICATIONS: None  FINDINGS: After catheter placement, the tip lies at the cavoatrial junction. The catheter aspirates normally and is ready for immediate use.  IMPRESSION: Placement of single lumen port a cath via right internal jugular vein. The catheter tip lies at the cavoatrial junction. A power injectable port a cath was placed and is ready for  immediate use.   Electronically Signed   By: Aletta Edouard M.D.   On: 07/27/2015 15:28   Ct Biopsy  07/24/2015   CLINICAL DATA:  39 year old male with a new diagnosis of lymphoma.  EXAM: CT BIOPSY  Date: 07/24/2015  PROCEDURE: 1. CT guided bone marrow aspiration and core biopsy Interventional Radiologist:  Criselda Peaches, MD  ANESTHESIA/SEDATION: Moderate (conscious) sedation was used. 2.5 mg Versed, 100 mcg Fentanyl were administered intravenously. The patient's vital signs were monitored continuously by radiology nursing throughout  the procedure.  Sedation Time: 9 minutes  FLUOROSCOPY TIME:  None  TECHNIQUE: Informed consent was obtained from the patient following explanation of the procedure, risks, benefits and alternatives. The patient understands, agrees and consents for the procedure. All questions were addressed. A time out was performed.  The patient was positioned prone and noncontrast localization CT was performed of the pelvis to demonstrate the iliac marrow spaces.  Maximal barrier sterile technique utilized including caps, mask, sterile gowns, sterile gloves, large sterile drape, hand hygiene, and betadine prep.  Under sterile conditions and local anesthesia, an 11 gauge coaxial bone biopsy needle was advanced into the right iliac marrow space. Needle position was confirmed with CT imaging. Initially, bone marrow aspiration was performed. Next, the 11 gauge outer cannula was utilized to obtain a right iliac bone marrow core biopsy. Needle was removed. Hemostasis was obtained with compression. The patient tolerated the procedure well. Samples were prepared with the cytotechnologist. No immediate complications.  IMPRESSION: CT guided right iliac bone marrow aspiration and core biopsy. The On-Control drill was used.  Signed,  Criselda Peaches, MD  [facility]  [facility]   Electronically Signed   By: Jacqulynn Cadet M.D.   On: 07/24/2015 12:32    Dg Chest Port 1 View  07/22/2015   CLINICAL DATA:  Pulmonary edema, hypoxia  EXAM: PORTABLE CHEST - 1 VIEW  COMPARISON:  07/21/2015  FINDINGS: Heart size is normal. Right-sided PICC line in place with tip over the cavoatrial junction. Hazy perihilar rate greater than left basilar airspace opacities are slightly decreased. Trace pleural effusions.  IMPRESSION: Interval slight decrease in hazy perihilar airspace opacities compatible with improving edema.   Electronically Signed   By: Conchita Paris M.D.   On: 07/22/2015 08:10   Dg Chest Port 1 View  07/21/2015   CLINICAL DATA:  Acute respiratory failure with hypoxemia. Nonsmoker.  EXAM: PORTABLE CHEST - 1 VIEW  COMPARISON:  07/20/2015.  FINDINGS: Cardiomediastinal silhouette appears within normal limits for size. Diffuse pulmonary opacities persist but are slightly improved consistent with resolving edema. Decreased BILATERAL pleural effusions. PICC line tip SVC. No pneumothorax.  IMPRESSION: Improving pulmonary edema.   Electronically Signed   By: Staci Righter M.D.   On: 07/21/2015 09:38   Dg Chest Port 1 View  07/20/2015   CLINICAL DATA:  Shortness of Breath  EXAM: PORTABLE CHEST - 1 VIEW  COMPARISON:  July 15, 2015  FINDINGS: There is alveolar edema throughout both lungs. There are small bilateral effusions. Heart is upper normal in size. There is mild pulmonary venous hypertension. No adenopathy.  IMPRESSION: Extensive edema. Question a degree of congestive heart failure versus noncardiogenic edema. Note that cardiac silhouette is unchanged compared to recent prior study. No adenopathy apparent.   Electronically Signed   By: Lowella Grip III M.D.   On: 07/20/2015 15:36   Dg Fluoro Guide Lumbar Puncture  07/24/2015   CLINICAL DATA:  Lymphoma  EXAM: DIAGNOSTIC LUMBAR PUNCTURE UNDER FLUOROSCOPIC GUIDANCE  FLUOROSCOPY TIME:  Radiation Exposure Index (as provided by the fluoroscopic device):  If the device does not provide the exposure index:   Fluoroscopy Time (in minutes and seconds):  13 seconds  Number of Acquired Images:  0  PROCEDURE: Informed consent was obtained from the patient prior to the procedure, including potential complications of headache, allergy, and pain. With the patient prone, the lower back was prepped with Betadine. 1% Lidocaine was used for local anesthesia. Lumbar puncture was performed at  the L3-4 level using a 22 gauge needle with return of clear CSF with an opening pressure of 14 cm water. 12 ml of CSF were obtained for laboratory studies. The patient tolerated the procedure well and there were no apparent complications.  IMPRESSION: 1. Technically successful lumbar puncture. 12 cc of clear CSF were obtained for laboratory studies.   Electronically Signed   By: Kerby Moors M.D.   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 Cytogenetics showed Myc Break apart event associated with chromosomal variants of Burkitt's lymphoma t(2;8) and t(8;22). Patient has completed the first cycle of R -EPOCH as inpatient and tolerated it well. His LDH level is down from 1200s to 188. No evidence of ongoing tumor lysis on labs done today. Improved fatigue. Had bone pains with Neulasta shot as expected. No other prohibitive toxicities.  2)h/o previous Spontaneous TLS -current tumor lysis labs negative  3) History of AKI ?related to contrast/urate nephropathy less likely urinary obstruction. Resolved current renal function normal. 4) Elevated transaminases - ? Related to allopurinol. resolved 5) Elevated free T4 - ? Related to his supplements vs ?thyroiditis vs central hyperthyroidism vs thyroid involvement by same neoplastic-cn process vs paraneoplastic process.TSH borderline elevated. Free T4 has normalized. 6) Dysguesia- improved. Eating well. Has gained back some weight. Plan -no overt new toxicities from chemotherapy at this time. (D2 of 5 EPOCH-R  today) -continue to monitor closely. -daily cbc, cmp, uric acid. -encouraged patient to ambulate as much as possible since he does not want lovenox for DVT prophylaxis -SCD while in bed -incentive spirometry -continued to provide supportive counseling.  Sullivan Lone MD [place] AAHIVMS [facility] Hematology/Oncology Physician [facility]  (Office): [phone] (Work cell): [phone] (Fax): [phone]

## 2015-08-17 NOTE — Progress Notes (Signed)
Manual calculation of BSA, dosing for doxorubicin, etoposide, and vincristine completed.  Secondary verification by Aldean Baker RN

## 2015-08-17 NOTE — Plan of Care (Signed)
Problem: Phase II Progression Outcomes Goal: Progress activity as tolerated unless otherwise ordered Outcome: Progressing Patient reports ambulating in hallway today

## 2015-08-17 NOTE — Progress Notes (Signed)
Heard IV pump beeping then the beeping stopped. Went to patient's room and fiancee at pump with door of channel open on chemo line. Had air bubbles in line. Told her I would fix it. Told her if pump beeped not to open door on channel and try to fix it. Told her to call the desk for a RN to come assist. OK to hit silence button but nothing else. Celesta Gentile said "OK".

## 2015-08-17 NOTE — Progress Notes (Signed)
Donald [name]   HEMATOLOGY/ONCOLOGY INPATIENT PROGRESS NOTE  Date of Service: 08/17/2015  Inpatient Attending: .Brunetta Genera, MD  SUBJECTIVE  Patient was seen this afternoon. In good spirits. Notes that he slept well for a second night straight which has been good. No nausea. No other acute new symptoms. Eating well. Has had good bowel movements after having been constipated after the first cycle. He notes that his belly feels better.  OBJECTIVE:  NAD  PHYSICAL EXAMINATION:  Filed Vitals:   08/16/15 0441 08/16/15 2035 08/16/15 2108 08/17/15 0500  BP: 116/59 135/76  108/56  Pulse: 56 84 64 56  Temp: 98.3 F (36.8 C) 98.1 F (36.7 C)  98 F (36.7 C)  TempSrc: Oral Oral  Oral  Resp: 18 20  16   Height:      Weight: 178 lb 12.7 oz (81.1 kg)   178 lb 14.4 oz (81.149 kg)  SpO2: 98% 99%  99%   Filed Weights   08/15/15 0958 08/16/15 0441 08/17/15 0500  Weight: 181 lb 11.2 oz (82.419 kg) 178 lb 12.7 oz (81.1 kg) 178 lb 14.4 oz (81.149 kg)   .Body mass index is 24.96 kg/(m^2).  GENERAL:alert, in no acute distress and comfortable SKIN: skin color, texture, turgor are normal, no rashes or significant lesions EYES: normal, conjunctiva are pink and non-injected, sclera clear OROPHARYNX:no exudate, no erythema and lips, buccal mucosa, and tongue normal  NECK: supple, no JVD, thyroid normal size, non-tender, without nodularity LYMPH: no palpable lymphadenopathy in the cervical, axillary or inguinal LUNGS: clear to auscultation with normal respiratory effort HEART: regular rate & rhythm, no murmurs and no lower extremity edema ABDOMEN: abdomen soft, minimal generalized tenderness to palpation, no peritoneal signs, no guarding or rigidity or rebound, normoactive bowel sounds  Musculoskeletal: no cyanosis of digits and no clubbing  PSYCH: alert & oriented x 3 with fluent speech NEURO: no focal motor/sensory deficits  MEDICAL HISTORY:  Past Medical History  Diagnosis Date  . EBV  infection 2013    Patient notes that he had an EBV infection in 2013 that left him with chronic fatigue. She also notes that he had significant lymphadenopathy and thyroiditis at the time. He has been managing his symptoms with an alternative medicine practitioner in [place] and with other alternative medicines.  . Marijuana use, episodic   . Complication of anesthesia   . Burkitt lymphoma ([place]) 08/15/2015    SURGICAL HISTORY: Past Surgical History  Procedure Laterality Date  . Tonsillectomy    . Appendectomy    . Laparoscopy N/A 07/20/2015    Procedure: LAPAROSCOPY DIAGNOSTIC, MULTIPLE BIOPSIES, DRAINAGE OF ASCITES;  Surgeon: Fanny Skates, MD;  Location: [facility];  Service: General;  Laterality: N/A;    SOCIAL HISTORY: Social History   Social History  . Marital Status: Married    Spouse Name: N/A  . Number of Children: N/A  . Years of Education: N/A   Occupational History  . Not on file.   Social History Main Topics  . Smoking status: Never Smoker   . Smokeless tobacco: Never Used  . Alcohol Use: No  . Drug Use: 1.00 per week    Special: Marijuana  . Sexual Activity: Yes   Other Topics Concern  . 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   He has a fiance and has been in a steady relationship.      He has tried to  maintain a very healthy lifestyle and cycles about 100 miles a week. He also uses a fair number of over-the-counter alternative medicines to stay healthy.    FAMILY HISTORY: Family History  Problem Relation Age of Onset  . Heart failure Father     ALLERGIES:  is allergic to levaquin and sulfa antibiotics.  MEDICATIONS:  Scheduled Meds: . DOXOrubicin/vinCRIStine/etoposide CHEMO IV infusion for Inpatient CI   Intravenous Once  . polyethylene glycol  17 g Oral Daily  . predniSONE  120 mg Oral QAC breakfast  . sodium bicarbonate/sodium chloride  1  application Mouth Rinse QID   Continuous Infusions: . sodium chloride 20 mL/hr at 08/16/15 2108   PRN Meds:.acetaminophen, alteplase, alum & mag hydroxide-simeth, Cold Pack, heparin lock flush, heparin lock flush, Hot Pack, oxyCODONE, senna-docusate, sodium chloride, sodium chloride  REVIEW OF SYSTEMS:    10 Point review of Systems was done is negative except as noted above.   LABORATORY DATA:  I have reviewed the data as listed  . CBC Latest Ref Rng 08/17/2015 08/16/2015 08/15/2015  WBC 4.0 - 10.5 K/uL 17.8(H) 22.7(H) 16.2(H)  Hemoglobin 13.0 - 17.0 g/dL 10.7(L) 11.1(L) 11.2(L)  Hematocrit 39.0 - 52.0 % 31.9(L) 33.3(L) 34.0(L)  Platelets 150 - 400 K/uL 307 306 251    . CMP Latest Ref Rng 08/17/2015 08/16/2015 08/15/2015  Glucose 65 - 99 mg/dL 110(H) 121(H) 92  BUN 6 - 20 mg/dL 17 17 11   Creatinine 0.61 - 1.24 mg/dL 0.89 0.82 0.81  Sodium 135 - 145 mmol/L 143 140 138  Potassium 3.5 - 5.1 mmol/L 3.8 4.1 4.0  Chloride 101 - 111 mmol/L 110 108 107  CO2 22 - 32 mmol/L 27 26 28   Calcium 8.9 - 10.3 mg/dL 8.5(L) 8.7(L) 8.5(L)  Total Protein 6.5 - 8.1 g/dL 5.6(L) 5.6(L) 5.6(L)  Total Bilirubin 0.3 - 1.2 mg/dL 0.6 0.6 0.4  Alkaline Phos 38 - 126 U/L 67 73 77  AST 15 - 41 U/L 24 26 25   ALT 17 - 63 U/L 41 43 48     RADIOGRAPHIC STUDIES: I have personally reviewed the radiological images as listed and agreed with the findings in the report. Dg Chest 2 View  08/03/2015   CLINICAL DATA:  Shortness of Breath, history of Burkitt's lymphoma status post first cycle of chemotherapy  EXAM: CHEST - 2 VIEW  COMPARISON:  07/22/2015  FINDINGS: Cardiac shadow is within normal limits. A right central venous port is seen in satisfactory position. The lungs are well aerated bilaterally. No focal infiltrate or sizable effusion is seen. No acute bony abnormality is noted.  IMPRESSION: No active disease.   Electronically Signed   By: Inez Catalina M.D.   On: 08/03/2015 14:19   Nm Pet Image Initial (pi) Skull  Base To Thigh  07/23/2015   CLINICAL DATA:  Initial treatment strategy for high-grade non-Hodgkin's lymphoma. B-cell lymphoma.  EXAM: NUCLEAR MEDICINE PET SKULL BASE TO THIGH  TECHNIQUE: 10.0 mCi F-18 FDG was injected intravenously. Full-ring PET imaging was performed from the skull base to thigh after the radiotracer. CT data was obtained and used for attenuation correction and anatomic localization.  FASTING BLOOD GLUCOSE:  Value: 88 mg/dl  COMPARISON:  CT abdomen 07/15/2015, CT thorax 07/14/2015  FINDINGS: NECK  Small hypermetabolic lymph nodes the just deep to the RIGHT clavicle measure 9 mm short axis on image 54, series 4 with intense metabolic activity (SUV max 6.2).  CHEST  Small hypermetabolic internal mammary lymph nodes on the RIGHT (image 66 with  SUV max equal 5.). Small hypermetabolic nodule posterior to the sternum in the anterior mediastinum on image 69 of fused data set.  Within the precordial space anterior to the liver there is a 4.5 cm hypermetabolic mass. Smaller hypermetabolic nodule anterior to the RIGHT ventricle.  ABDOMEN/PELVIS  There is a thin rim of intense metabolic activity associated entirety of the peritoneal surface consists with peritoneal metastasis / carcinomatosis. There is more focal activity within the LEFT abdomen associated with the small bowel wall thickening. This segmental thickening measures 17 mm in single wall thickness with intense metabolic activity (SUV max 14.6). Central mesenteric mass measures 3 cm with SUV max 13.2.  There are no hypermetabolic inguinal or iliac lymph nodes.  SKELETON  No focal hypermetabolic activity to suggest skeletal metastasis.  IMPRESSION: 1. Thin rim of hypermetabolic activity throughout the peritoneal space consistent with peritoneal carcinomatosis. 2. Focal intense metabolic activity associated with the proximal small bowel consistent with lymphoma involvement of bowel. 3. Hypermetabolic mesenteric nodule  /node consistent with lymphoma.  4. Several small hypermetabolic lymph nodes in the RIGHT supraclavicular and internal mammary nodal stations. 5. Nodal metastasis anterior to the liver in the precordial space. This may be the most assessable for biopsy. 6. Normal spleen and bone marrow.   Electronically Signed   By: Suzy Bouchard M.D.   On: 07/23/2015 11:35   Ir Fluoro Guide Cv Line Right  07/27/2015   CLINICAL DATA:  New diagnosis of Burkitt's lymphoma. The patient requires a porta cath for chemotherapy.  EXAM: IMPLANTED PORT A CATH PLACEMENT WITH ULTRASOUND AND FLUOROSCOPIC GUIDANCE  ANESTHESIA/SEDATION: 6.0 Mg IV Versed; 150 mcg IV Fentanyl  Total Moderate Sedation Time:  30 minutes  Additional Medications: 2 g IV Ancef. As antibiotic prophylaxis, Ancef was ordered pre-procedure and administered intravenously within one hour of incision.  FLUOROSCOPY TIME:  24 seconds.  PROCEDURE: The procedure, risks, benefits, and alternatives were explained to the patient. Questions regarding the procedure were encouraged and answered. The patient understands and consents to the procedure. A time-out was performed prior to the procedure.  Ultrasound was used to confirm patency of the right internal jugular vein. The right neck and chest were prepped with chlorhexidine in a sterile fashion, and a sterile drape was applied covering the operative field. Maximum barrier sterile technique with sterile gowns and gloves were used for the procedure. Local anesthesia was provided with 1% lidocaine.  After creating a small venotomy incision, a 21 gauge needle was advanced into the right internal jugular vein under direct, real-time ultrasound guidance. Ultrasound image documentation was performed. After securing guidewire access, an 8 Fr dilator was placed. A J-wire was kinked to measure appropriate catheter length.  A subcutaneous port pocket was then created along the upper chest wall utilizing sharp and blunt dissection. Portable cautery was utilized. The  pocket was irrigated with sterile saline.  A single lumen power injectable port was chosen for placement. The 8 Fr catheter was tunneled from the port pocket site to the venotomy incision. The port was placed in the pocket. External catheter was trimmed to appropriate length based on guidewire measurement.  At the venotomy, an 8 Fr peel-away sheath was placed over a guidewire. The catheter was then placed through the sheath and the sheath removed. Final catheter positioning was confirmed and documented with a fluoroscopic spot image. The port was accessed with a needle and aspirated and flushed with heparinized saline. The needle was left in place for immediate use.  The venotomy and  port pocket incisions were closed with subcutaneous 3-0 Monocryl and subcuticular 4-0 Vicryl. Dermabond was applied to both incisions.  COMPLICATIONS: None  FINDINGS: After catheter placement, the tip lies at the cavoatrial junction. The catheter aspirates normally and is ready for immediate use.  IMPRESSION: Placement of single lumen port a cath via right internal jugular vein. The catheter tip lies at the cavoatrial junction. A power injectable port a cath was placed and is ready for immediate use.   Electronically Signed   By: Aletta Edouard M.D.   On: 07/27/2015 15:28   Ir US Guide Vasc Access Right  07/27/2015   CLINICAL DATA:  New diagnosis of Burkitt's lymphoma. The patient requires a porta cath for chemotherapy.  EXAM: IMPLANTED PORT A CATH PLACEMENT WITH ULTRASOUND AND FLUOROSCOPIC GUIDANCE  ANESTHESIA/SEDATION: 6.0 Mg IV Versed; 150 mcg IV Fentanyl  Total Moderate Sedation Time:  30 minutes  Additional Medications: 2 g IV Ancef. As antibiotic prophylaxis, Ancef was ordered pre-procedure and administered intravenously within one hour of incision.  FLUOROSCOPY TIME:  24 seconds.  PROCEDURE: The procedure, risks, benefits, and alternatives were explained to the patient. Questions regarding the procedure were encouraged and  answered. The patient understands and consents to the procedure. A time-out was performed prior to the procedure.  Ultrasound was used to confirm patency of the right internal jugular vein. The right neck and chest were prepped with chlorhexidine in a sterile fashion, and a sterile drape was applied covering the operative field. Maximum barrier sterile technique with sterile gowns and gloves were used for the procedure. Local anesthesia was provided with 1% lidocaine.  After creating a small venotomy incision, a 21 gauge needle was advanced into the right internal jugular vein under direct, real-time ultrasound guidance. Ultrasound image documentation was performed. After securing guidewire access, an 8 Fr dilator was placed. A J-wire was kinked to measure appropriate catheter length.  A subcutaneous port pocket was then created along the upper chest wall utilizing sharp and blunt dissection. Portable cautery was utilized. The pocket was irrigated with sterile saline.  A single lumen power injectable port was chosen for placement. The 8 Fr catheter was tunneled from the port pocket site to the venotomy incision. The port was placed in the pocket. External catheter was trimmed to appropriate length based on guidewire measurement.  At the venotomy, an 8 Fr peel-away sheath was placed over a guidewire. The catheter was then placed through the sheath and the sheath removed. Final catheter positioning was confirmed and documented with a fluoroscopic spot image. The port was accessed with a needle and aspirated and flushed with heparinized saline. The needle was left in place for immediate use.  The venotomy and port pocket incisions were closed with subcutaneous 3-0 Monocryl and subcuticular 4-0 Vicryl. Dermabond was applied to both incisions.  COMPLICATIONS: None  FINDINGS: After catheter placement, the tip lies at the cavoatrial junction. The catheter aspirates normally and is ready for immediate use.  IMPRESSION:  Placement of single lumen port a cath via right internal jugular vein. The catheter tip lies at the cavoatrial junction. A power injectable port a cath was placed and is ready for immediate use.   Electronically Signed   By: Aletta Edouard M.D.   On: 07/27/2015 15:28   Ct Biopsy  07/24/2015   CLINICAL DATA:  39 year old male with a new diagnosis of lymphoma.  EXAM: CT BIOPSY  Date: 07/24/2015  PROCEDURE: 1. CT guided bone marrow aspiration and core biopsy Interventional Radiologist:  Criselda Peaches, MD  ANESTHESIA/SEDATION: Moderate (conscious) sedation was used. 2.5 mg Versed, 100 mcg Fentanyl were administered intravenously. The patient's vital signs were monitored continuously by radiology nursing throughout the procedure.  Sedation Time: 9 minutes  FLUOROSCOPY TIME:  None  TECHNIQUE: Informed consent was obtained from the patient following explanation of the procedure, risks, benefits and alternatives. The patient understands, agrees and consents for the procedure. All questions were addressed. A time out was performed.  The patient was positioned prone and noncontrast localization CT was performed of the pelvis to demonstrate the iliac marrow spaces.  Maximal barrier sterile technique utilized including caps, mask, sterile gowns, sterile gloves, large sterile drape, hand hygiene, and betadine prep.  Under sterile conditions and local anesthesia, an 11 gauge coaxial bone biopsy needle was advanced into the right iliac marrow space. Needle position was confirmed with CT imaging. Initially, bone marrow aspiration was performed. Next, the 11 gauge outer cannula was utilized to obtain a right iliac bone marrow core biopsy. Needle was removed. Hemostasis was obtained with compression. The patient tolerated the procedure well. Samples were prepared with the cytotechnologist. No immediate complications.  IMPRESSION: CT guided right iliac bone marrow aspiration and core biopsy. The On-Control drill was used.   Signed,  Criselda Peaches, MD  [facility]  [facility]   Electronically Signed   By: Jacqulynn Cadet M.D.   On: 07/24/2015 12:32   Dg Chest Port 1 View  07/22/2015   CLINICAL DATA:  Pulmonary edema, hypoxia  EXAM: PORTABLE CHEST - 1 VIEW  COMPARISON:  07/21/2015  FINDINGS: Heart size is normal. Right-sided PICC line in place with tip over the cavoatrial junction. Hazy perihilar rate greater than left basilar airspace opacities are slightly decreased. Trace pleural effusions.  IMPRESSION: Interval slight decrease in hazy perihilar airspace opacities compatible with improving edema.   Electronically Signed   By: Conchita Paris M.D.   On: 07/22/2015 08:10   Dg Chest Port 1 View  07/21/2015   CLINICAL DATA:  Acute respiratory failure with hypoxemia. Nonsmoker.  EXAM: PORTABLE CHEST - 1 VIEW  COMPARISON:  07/20/2015.  FINDINGS: Cardiomediastinal silhouette appears within normal limits for size. Diffuse pulmonary opacities persist but are slightly improved consistent with resolving edema. Decreased BILATERAL pleural effusions. PICC line tip SVC. No pneumothorax.  IMPRESSION: Improving pulmonary edema.   Electronically Signed   By: Staci Righter M.D.   On: 07/21/2015 09:38   Dg Chest Port 1 View  07/20/2015   CLINICAL DATA:  Shortness of Breath  EXAM: PORTABLE CHEST - 1 VIEW  COMPARISON:  July 15, 2015  FINDINGS: There is alveolar edema throughout both lungs. There are small bilateral effusions. Heart is upper normal in size. There is mild pulmonary venous hypertension. No adenopathy.  IMPRESSION: Extensive edema. Question a degree of congestive heart failure versus noncardiogenic edema. Note that cardiac silhouette is unchanged compared to recent prior study. No adenopathy apparent.   Electronically Signed   By: Lowella Grip III M.D.   On: 07/20/2015 15:36   Dg Fluoro Guide Lumbar Puncture  07/24/2015   CLINICAL DATA:  Lymphoma  EXAM: DIAGNOSTIC  LUMBAR PUNCTURE UNDER FLUOROSCOPIC GUIDANCE  FLUOROSCOPY TIME:  Radiation Exposure Index (as provided by the fluoroscopic device):  If the device does not provide the exposure index:  Fluoroscopy Time (in minutes and seconds):  13 seconds  Number of Acquired Images:  0  PROCEDURE: Informed consent was obtained from the patient prior to the procedure,  including potential complications of headache, allergy, and pain. With the patient prone, the lower back was prepped with Betadine. 1% Lidocaine was used for local anesthesia. Lumbar puncture was performed at the L3-4 level using a 22 gauge needle with return of clear CSF with an opening pressure of 14 cm water. 12 ml of CSF were obtained for laboratory studies. The patient tolerated the procedure well and there were no apparent complications.  IMPRESSION: 1. Technically successful lumbar puncture. 12 cc of clear CSF were obtained for laboratory studies.   Electronically Signed   By: Kerby Moors M.D.   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 Cytogenetics showed Myc Break apart event associated with chromosomal variants of Burkitt's lymphoma t(2;8) and t(8;22). Patient has completed the first cycle of R -EPOCH as inpatient and tolerated it well. His LDH level is down from 1200s to 188. No evidence of ongoing tumor lysis on labs done today. Improved fatigue. Had bone pains with Neulasta shot as expected. No other prohibitive toxicities.  2)h/o previous Spontaneous TLS -current tumor lysis labs negative.  3) History of AKI ?related to contrast/urate nephropathy less likely urinary obstruction. Resolved current renal function normal. 4) Elevated transaminases - ? Related to allopurinol. resolved 5) Elevated free T4 - ? Related to his supplements vs ?thyroiditis vs central hyperthyroidism vs thyroid involvement by same neoplastic-cn process vs paraneoplastic process.TSH borderline  elevated. Free T4 has normalized. 6) Dysguesia- improved. Eating well. Has gained back some weight. Plan -no overt new toxicities from chemotherapy at this time. (D3 of 5 EPOCH-R today). Blood counts stable. -continue to monitor closely. -daily cbc, cmp, uric acid. -encouraged patient to ambulate as much as possible since he does not want lovenox for DVT prophylaxis. -SCD while in bed -incentive spirometry -Patient notes that he is considering alternative medication options after the treatment with initial chemotherapy. He mentioned considering "insulin potentiation therapy". He was counseled that there isn't clear data that this is an effective or approved treatment plan. He said he just thinking about it. -Will plan to get a PET CT scan as outpatient prior to cycle 3.   Sullivan Lone MD [place] AAHIVMS [facility] Hematology/Oncology Physician [facility]  (Office): [phone] (Work cell): [phone] (Fax): [phone]

## 2015-08-17 NOTE — Progress Notes (Signed)
Entered room with Aldean Baker RN to given report and IV line with chemo had been turned off by patient. He reports that it was beeping and he had called the desk for this RN to come. I had not received the message. Told patient that he can not be turning off the pump.

## 2015-08-17 NOTE — Plan of Care (Signed)
Problem: Phase I Progression Outcomes Goal: Absence of IV chemotherapy infiltration Outcome: Progressing Patient tolerating chemo and PAC site WNL.

## 2015-08-17 NOTE — Progress Notes (Signed)
Patient was observed manipulating his pump and tubing of chemo through the night.  Patient was educated that the nurses should be the only ones operating the pump as to ensure his safety.  Explained that receiving his chemo too fast could be detrimental to him.  Reiterated calling nursing to assist when the pump was beeping.  Jena Gauss, RN present during conversation. Patient verbalized understanding. Pump was locked to ensure greater safety.

## 2015-08-17 NOTE — Care Management Note (Signed)
Case Management Note  Patient Details  Name: Donald [name] MRN: [id number] Date of Birth: 12/31/1975  Subjective/Objective:            39 yo admitted with Burkitt lymphoma for chemo        Action/Plan: From home with family  Expected Discharge Date:   (unknown)               Expected Discharge Plan:  Home/Self Care  In-House Referral:     Discharge planning Services  CM Consult  Post Acute Care Choice:    Choice offered to:     DME Arranged:    DME Agency:     HH Arranged:    HH Agency:     Status of Service:  In process, will continue to follow  Medicare Important Message Given:    Date Medicare IM Given:    Medicare IM give by:    Date Additional Medicare IM Given:    Additional Medicare Important Message give by:     If discussed at [place] of Stay Meetings, dates discussed:    Additional Comments: Chart reviewed and CM following for DC needs. Lynnell Catalan, RN 08/17/2015, 12:52 PM

## 2015-08-18 ENCOUNTER — Other Ambulatory Visit: Payer: Self-pay | Admitting: Hematology

## 2015-08-18 DIAGNOSIS — C8378 Burkitt lymphoma, lymph nodes of multiple sites: Secondary | ICD-10-CM

## 2015-08-18 LAB — CBC
HCT: 31.2 % — ABNORMAL LOW (ref 39.0–52.0)
HEMOGLOBIN: 10.6 g/dL — AB (ref 13.0–17.0)
MCH: 29.5 pg (ref 26.0–34.0)
MCHC: 34 g/dL (ref 30.0–36.0)
MCV: 86.9 fL (ref 78.0–100.0)
PLATELETS: 274 10*3/uL (ref 150–400)
RBC: 3.59 MIL/uL — AB (ref 4.22–5.81)
RDW: 13 % (ref 11.5–15.5)
WBC: 9.6 10*3/uL (ref 4.0–10.5)

## 2015-08-18 LAB — COMPREHENSIVE METABOLIC PANEL
ALK PHOS: 58 U/L (ref 38–126)
ALT: 36 U/L (ref 17–63)
ANION GAP: 6 (ref 5–15)
AST: 22 U/L (ref 15–41)
Albumin: 3.1 g/dL — ABNORMAL LOW (ref 3.5–5.0)
BILIRUBIN TOTAL: 0.7 mg/dL (ref 0.3–1.2)
BUN: 18 mg/dL (ref 6–20)
CALCIUM: 8.4 mg/dL — AB (ref 8.9–10.3)
CO2: 27 mmol/L (ref 22–32)
Chloride: 108 mmol/L (ref 101–111)
Creatinine, Ser: 0.68 mg/dL (ref 0.61–1.24)
GFR calc non Af Amer: >60 mL/min (ref >60)
GLUCOSE: 90 mg/dL (ref 65–99)
Potassium: 3.6 mmol/L (ref 3.5–5.1)
Sodium: 141 mmol/L (ref 135–145)
TOTAL PROTEIN: 5.3 g/dL — AB (ref 6.5–8.1)

## 2015-08-18 LAB — URIC ACID: URIC ACID, SERUM: 4.4 mg/dL (ref 4.4–7.6)

## 2015-08-18 MED ORDER — ONDANSETRON HCL 40 MG/20ML IJ SOLN
8.0000 mg | Freq: Once | INTRAMUSCULAR | Status: AC
Start: 2015-08-18 — End: 2015-08-18
  Administered 2015-08-18: 8 mg via INTRAVENOUS
  Filled 2015-08-18: qty 4

## 2015-08-18 MED ORDER — VINCRISTINE SULFATE CHEMO INJECTION 1 MG/ML
Freq: Once | INTRAVENOUS | Status: AC
  Administered 2015-08-18: 10:00:00 via INTRAVENOUS
  Filled 2015-08-18: qty 11

## 2015-08-18 NOTE — Progress Notes (Signed)
[name]   HEMATOLOGY/ONCOLOGY INPATIENT PROGRESS NOTE  Date of Service: 08/18/2015  Inpatient Attending: .Brunetta Genera, MD  SUBJECTIVE  Patient notes no acute new symptoms. He has about grade 1 fatigue. No chest pain. No shortness of breath. Abdominal pain better. Good bowel movements. Good oral intake. No other acute new concerns. Given a printout about the lack of data and limitations about insulin potentiation therapy which he was considering as an alternative. Given him a blanket from leukemia and [place].  OBJECTIVE:  NAD  PHYSICAL EXAMINATION:  Filed Vitals:   08/16/15 2108 08/17/15 0500 08/17/15 2220 08/18/15 0641  BP:  108/56 112/50 109/61  Pulse: 64 56 100 41  Temp:  98 F (36.7 C) 98.7 F (37.1 C) 98.2 F (36.8 C)  TempSrc:  Oral Oral Oral  Resp:  16 18 16   Height:      Weight:  178 lb 14.4 oz (81.149 kg)  175 lb 7.8 oz (79.6 kg)  SpO2:  99% 97% 100%   Filed Weights   08/16/15 0441 08/17/15 0500 08/18/15 0641  Weight: 178 lb 12.7 oz (81.1 kg) 178 lb 14.4 oz (81.149 kg) 175 lb 7.8 oz (79.6 kg)   .Body mass index is 24.49 kg/(m^2).  GENERAL:alert, in no acute distress and comfortable SKIN: skin color, texture, turgor are normal, no rashes or significant lesions EYES: normal, conjunctiva are pink and non-injected, sclera clear OROPHARYNX:no exudate, no erythema and lips, buccal mucosa, and tongue normal  NECK: supple, no JVD, thyroid normal size, non-tender, without nodularity LYMPH: no palpable lymphadenopathy in the cervical, axillary or inguinal LUNGS: clear to auscultation with normal respiratory effort HEART: regular rate & rhythm, no murmurs and no lower extremity edema ABDOMEN: abdomen soft, minimal generalized tenderness to palpation, no peritoneal signs, no guarding or rigidity or rebound, normoactive bowel sounds  Musculoskeletal: no cyanosis of digits and no clubbing  PSYCH: alert & oriented x 3 with fluent speech NEURO: no focal  motor/sensory deficits  MEDICAL HISTORY:  Past Medical History  Diagnosis Date  . EBV infection 2013    Patient notes that he had an EBV infection in 2013 that left him with chronic fatigue. She also notes that he had significant lymphadenopathy and thyroiditis at the time. He has been managing his symptoms with an alternative medicine practitioner in [place] and with other alternative medicines.  . Marijuana use, episodic   . Complication of anesthesia   . Burkitt lymphoma ([place]) 08/15/2015    SURGICAL HISTORY: Past Surgical History  Procedure Laterality Date  . Tonsillectomy    . Appendectomy    . Laparoscopy N/A 07/20/2015    Procedure: LAPAROSCOPY DIAGNOSTIC, MULTIPLE BIOPSIES, DRAINAGE OF ASCITES;  Surgeon: Fanny Skates, MD;  Location: [facility];  Service: General;  Laterality: N/A;    SOCIAL HISTORY: Social History   Social History  . Marital Status: Married    Spouse Name: N/A  . Number of Children: N/A  . Years of Education: N/A   Occupational History  . Not on file.   Social History Main Topics  . Smoking status: Never Smoker   . Smokeless tobacco: Never Used  . Alcohol Use: No  . Drug Use: 1.00 per week    Special: Marijuana  . Sexual Activity: Yes   Other Topics Concern  . 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   He has a fiance and has been in  a steady relationship.      He has tried to maintain a very healthy lifestyle and cycles about 100 miles a week. He also uses a fair number of over-the-counter alternative medicines to stay healthy.    FAMILY HISTORY: Family History  Problem Relation Age of Onset  . Heart failure Father     ALLERGIES:  is allergic to levaquin and sulfa antibiotics.  MEDICATIONS:  Scheduled Meds: . DOXOrubicin/vinCRIStine/etoposide CHEMO IV infusion for Inpatient CI   Intravenous Once  . polyethylene glycol  17 g Oral Daily   . predniSONE  120 mg Oral QAC breakfast  . sodium bicarbonate/sodium chloride  1 application Mouth Rinse QID   Continuous Infusions: . sodium chloride 20 mL/hr at 08/17/15 1658   PRN Meds:.acetaminophen, alteplase, alum & mag hydroxide-simeth, Cold Pack, heparin lock flush, heparin lock flush, Hot Pack, ondansetron (ZOFRAN) IV, oxyCODONE, senna-docusate, sodium chloride, sodium chloride  REVIEW OF SYSTEMS:    10 Point review of Systems was done is negative except as noted above.   LABORATORY DATA:  I have reviewed the data as listed  . CBC Latest Ref Rng 08/18/2015 08/17/2015 08/16/2015  WBC 4.0 - 10.5 K/uL 9.6 17.8(H) 22.7(H)  Hemoglobin 13.0 - 17.0 g/dL 10.6(L) 10.7(L) 11.1(L)  Hematocrit 39.0 - 52.0 % 31.2(L) 31.9(L) 33.3(L)  Platelets 150 - 400 K/uL 274 307 306    . CMP Latest Ref Rng 08/18/2015 08/17/2015 08/16/2015  Glucose 65 - 99 mg/dL 90 110(H) 121(H)  BUN 6 - 20 mg/dL 18 17 17   Creatinine 0.61 - 1.24 mg/dL 0.68 0.89 0.82  Sodium 135 - 145 mmol/L 141 143 140  Potassium 3.5 - 5.1 mmol/L 3.6 3.8 4.1  Chloride 101 - 111 mmol/L 108 110 108  CO2 22 - 32 mmol/L 27 27 26   Calcium 8.9 - 10.3 mg/dL 8.4(L) 8.5(L) 8.7(L)  Total Protein 6.5 - 8.1 g/dL 5.3(L) 5.6(L) 5.6(L)  Total Bilirubin 0.3 - 1.2 mg/dL 0.7 0.6 0.6  Alkaline Phos 38 - 126 U/L 58 67 73  AST 15 - 41 U/L 22 24 26   ALT 17 - 63 U/L 36 41 43     RADIOGRAPHIC STUDIES: I have personally reviewed the radiological images as listed and agreed with the findings in the report. Dg Chest 2 View  08/03/2015   CLINICAL DATA:  Shortness of Breath, history of Burkitt's lymphoma status post first cycle of chemotherapy  EXAM: CHEST - 2 VIEW  COMPARISON:  07/22/2015  FINDINGS: Cardiac shadow is within normal limits. A right central venous port is seen in satisfactory position. The lungs are well aerated bilaterally. No focal infiltrate or sizable effusion is seen. No acute bony abnormality is noted.  IMPRESSION: No active disease.    Electronically Signed   By: Inez Catalina M.D.   On: 08/03/2015 14:19   Nm Pet Image Initial (pi) Skull Base To Thigh  07/23/2015   CLINICAL DATA:  Initial treatment strategy for high-grade non-Hodgkin's lymphoma. B-cell lymphoma.  EXAM: NUCLEAR MEDICINE PET SKULL BASE TO THIGH  TECHNIQUE: 10.0 mCi F-18 FDG was injected intravenously. Full-ring PET imaging was performed from the skull base to thigh after the radiotracer. CT data was obtained and used for attenuation correction and anatomic localization.  FASTING BLOOD GLUCOSE:  Value: 88 mg/dl  COMPARISON:  CT abdomen 07/15/2015, CT thorax 07/14/2015  FINDINGS: NECK  Small hypermetabolic lymph nodes the just deep to the RIGHT clavicle measure 9 mm short axis on image 54, series 4 with intense metabolic activity (SUV max 6.2).  CHEST  Small hypermetabolic internal mammary lymph nodes on the RIGHT (image 66 with SUV max equal 5.). Small hypermetabolic nodule posterior to the sternum in the anterior mediastinum on image 69 of fused data set.  Within the precordial space anterior to the liver there is a 4.5 cm hypermetabolic mass. Smaller hypermetabolic nodule anterior to the RIGHT ventricle.  ABDOMEN/PELVIS  There is a thin rim of intense metabolic activity associated entirety of the peritoneal surface consists with peritoneal metastasis / carcinomatosis. There is more focal activity within the LEFT abdomen associated with the small bowel wall thickening. This segmental thickening measures 17 mm in single wall thickness with intense metabolic activity (SUV max 14.6). Central mesenteric mass measures 3 cm with SUV max 13.2.  There are no hypermetabolic inguinal or iliac lymph nodes.  SKELETON  No focal hypermetabolic activity to suggest skeletal metastasis.  IMPRESSION: 1. Thin rim of hypermetabolic activity throughout the peritoneal space consistent with peritoneal carcinomatosis. 2. Focal intense metabolic activity associated with the proximal small bowel consistent  with lymphoma involvement of bowel. 3. Hypermetabolic mesenteric nodule  /node consistent with lymphoma. 4. Several small hypermetabolic lymph nodes in the RIGHT supraclavicular and internal mammary nodal stations. 5. Nodal metastasis anterior to the liver in the precordial space. This may be the most assessable for biopsy. 6. Normal spleen and bone marrow.   Electronically Signed   By: Suzy Bouchard M.D.   On: 07/23/2015 11:35   Ir Fluoro Guide Cv Line Right  07/27/2015   CLINICAL DATA:  New diagnosis of Burkitt's lymphoma. The patient requires a porta cath for chemotherapy.  EXAM: IMPLANTED PORT A CATH PLACEMENT WITH ULTRASOUND AND FLUOROSCOPIC GUIDANCE  ANESTHESIA/SEDATION: 6.0 Mg IV Versed; 150 mcg IV Fentanyl  Total Moderate Sedation Time:  30 minutes  Additional Medications: 2 g IV Ancef. As antibiotic prophylaxis, Ancef was ordered pre-procedure and administered intravenously within one hour of incision.  FLUOROSCOPY TIME:  24 seconds.  PROCEDURE: The procedure, risks, benefits, and alternatives were explained to the patient. Questions regarding the procedure were encouraged and answered. The patient understands and consents to the procedure. A time-out was performed prior to the procedure.  Ultrasound was used to confirm patency of the right internal jugular vein. The right neck and chest were prepped with chlorhexidine in a sterile fashion, and a sterile drape was applied covering the operative field. Maximum barrier sterile technique with sterile gowns and gloves were used for the procedure. Local anesthesia was provided with 1% lidocaine.  After creating a small venotomy incision, a 21 gauge needle was advanced into the right internal jugular vein under direct, real-time ultrasound guidance. Ultrasound image documentation was performed. After securing guidewire access, an 8 Fr dilator was placed. A J-wire was kinked to measure appropriate catheter length.  A subcutaneous port pocket was then created  along the upper chest wall utilizing sharp and blunt dissection. Portable cautery was utilized. The pocket was irrigated with sterile saline.  A single lumen power injectable port was chosen for placement. The 8 Fr catheter was tunneled from the port pocket site to the venotomy incision. The port was placed in the pocket. External catheter was trimmed to appropriate length based on guidewire measurement.  At the venotomy, an 8 Fr peel-away sheath was placed over a guidewire. The catheter was then placed through the sheath and the sheath removed. Final catheter positioning was confirmed and documented with a fluoroscopic spot image. The port was accessed with a needle and aspirated and flushed with heparinized  saline. The needle was left in place for immediate use.  The venotomy and port pocket incisions were closed with subcutaneous 3-0 Monocryl and subcuticular 4-0 Vicryl. Dermabond was applied to both incisions.  COMPLICATIONS: None  FINDINGS: After catheter placement, the tip lies at the cavoatrial junction. The catheter aspirates normally and is ready for immediate use.  IMPRESSION: Placement of single lumen port a cath via right internal jugular vein. The catheter tip lies at the cavoatrial junction. A power injectable port a cath was placed and is ready for immediate use.   Electronically Signed   By: Aletta Edouard M.D.   On: 07/27/2015 15:28   Ir US Guide Vasc Access Right  07/27/2015   CLINICAL DATA:  New diagnosis of Burkitt's lymphoma. The patient requires a porta cath for chemotherapy.  EXAM: IMPLANTED PORT A CATH PLACEMENT WITH ULTRASOUND AND FLUOROSCOPIC GUIDANCE  ANESTHESIA/SEDATION: 6.0 Mg IV Versed; 150 mcg IV Fentanyl  Total Moderate Sedation Time:  30 minutes  Additional Medications: 2 g IV Ancef. As antibiotic prophylaxis, Ancef was ordered pre-procedure and administered intravenously within one hour of incision.  FLUOROSCOPY TIME:  24 seconds.  PROCEDURE: The procedure, risks, benefits, and  alternatives were explained to the patient. Questions regarding the procedure were encouraged and answered. The patient understands and consents to the procedure. A time-out was performed prior to the procedure.  Ultrasound was used to confirm patency of the right internal jugular vein. The right neck and chest were prepped with chlorhexidine in a sterile fashion, and a sterile drape was applied covering the operative field. Maximum barrier sterile technique with sterile gowns and gloves were used for the procedure. Local anesthesia was provided with 1% lidocaine.  After creating a small venotomy incision, a 21 gauge needle was advanced into the right internal jugular vein under direct, real-time ultrasound guidance. Ultrasound image documentation was performed. After securing guidewire access, an 8 Fr dilator was placed. A J-wire was kinked to measure appropriate catheter length.  A subcutaneous port pocket was then created along the upper chest wall utilizing sharp and blunt dissection. Portable cautery was utilized. The pocket was irrigated with sterile saline.  A single lumen power injectable port was chosen for placement. The 8 Fr catheter was tunneled from the port pocket site to the venotomy incision. The port was placed in the pocket. External catheter was trimmed to appropriate length based on guidewire measurement.  At the venotomy, an 8 Fr peel-away sheath was placed over a guidewire. The catheter was then placed through the sheath and the sheath removed. Final catheter positioning was confirmed and documented with a fluoroscopic spot image. The port was accessed with a needle and aspirated and flushed with heparinized saline. The needle was left in place for immediate use.  The venotomy and port pocket incisions were closed with subcutaneous 3-0 Monocryl and subcuticular 4-0 Vicryl. Dermabond was applied to both incisions.  COMPLICATIONS: None  FINDINGS: After catheter placement, the tip lies at the  cavoatrial junction. The catheter aspirates normally and is ready for immediate use.  IMPRESSION: Placement of single lumen port a cath via right internal jugular vein. The catheter tip lies at the cavoatrial junction. A power injectable port a cath was placed and is ready for immediate use.   Electronically Signed   By: Aletta Edouard M.D.   On: 07/27/2015 15:28   Ct Biopsy  07/24/2015   CLINICAL DATA:  39 year old male with a new diagnosis of lymphoma.  EXAM: CT BIOPSY  Date: 07/24/2015  PROCEDURE: 1. CT guided bone marrow aspiration and core biopsy Interventional Radiologist:  Criselda Peaches, MD  ANESTHESIA/SEDATION: Moderate (conscious) sedation was used. 2.5 mg Versed, 100 mcg Fentanyl were administered intravenously. The patient's vital signs were monitored continuously by radiology nursing throughout the procedure.  Sedation Time: 9 minutes  FLUOROSCOPY TIME:  None  TECHNIQUE: Informed consent was obtained from the patient following explanation of the procedure, risks, benefits and alternatives. The patient understands, agrees and consents for the procedure. All questions were addressed. A time out was performed.  The patient was positioned prone and noncontrast localization CT was performed of the pelvis to demonstrate the iliac marrow spaces.  Maximal barrier sterile technique utilized including caps, mask, sterile gowns, sterile gloves, large sterile drape, hand hygiene, and betadine prep.  Under sterile conditions and local anesthesia, an 11 gauge coaxial bone biopsy needle was advanced into the right iliac marrow space. Needle position was confirmed with CT imaging. Initially, bone marrow aspiration was performed. Next, the 11 gauge outer cannula was utilized to obtain a right iliac bone marrow core biopsy. Needle was removed. Hemostasis was obtained with compression. The patient tolerated the procedure well. Samples were prepared with the cytotechnologist. No immediate complications.   IMPRESSION: CT guided right iliac bone marrow aspiration and core biopsy. The On-Control drill was used.  Signed,  Criselda Peaches, MD  [facility]  [facility]   Electronically Signed   By: Jacqulynn Cadet M.D.   On: 07/24/2015 12:32   Dg Chest Port 1 View  07/22/2015   CLINICAL DATA:  Pulmonary edema, hypoxia  EXAM: PORTABLE CHEST - 1 VIEW  COMPARISON:  07/21/2015  FINDINGS: Heart size is normal. Right-sided PICC line in place with tip over the cavoatrial junction. Hazy perihilar rate greater than left basilar airspace opacities are slightly decreased. Trace pleural effusions.  IMPRESSION: Interval slight decrease in hazy perihilar airspace opacities compatible with improving edema.   Electronically Signed   By: Conchita Paris M.D.   On: 07/22/2015 08:10   Dg Chest Port 1 View  07/21/2015   CLINICAL DATA:  Acute respiratory failure with hypoxemia. Nonsmoker.  EXAM: PORTABLE CHEST - 1 VIEW  COMPARISON:  07/20/2015.  FINDINGS: Cardiomediastinal silhouette appears within normal limits for size. Diffuse pulmonary opacities persist but are slightly improved consistent with resolving edema. Decreased BILATERAL pleural effusions. PICC line tip SVC. No pneumothorax.  IMPRESSION: Improving pulmonary edema.   Electronically Signed   By: Staci Righter M.D.   On: 07/21/2015 09:38   Dg Chest Port 1 View  07/20/2015   CLINICAL DATA:  Shortness of Breath  EXAM: PORTABLE CHEST - 1 VIEW  COMPARISON:  July 15, 2015  FINDINGS: There is alveolar edema throughout both lungs. There are small bilateral effusions. Heart is upper normal in size. There is mild pulmonary venous hypertension. No adenopathy.  IMPRESSION: Extensive edema. Question a degree of congestive heart failure versus noncardiogenic edema. Note that cardiac silhouette is unchanged compared to recent prior study. No adenopathy apparent.   Electronically Signed   By: Lowella Grip III M.D.   On:  07/20/2015 15:36   Dg Fluoro Guide Lumbar Puncture  07/24/2015   CLINICAL DATA:  Lymphoma  EXAM: DIAGNOSTIC LUMBAR PUNCTURE UNDER FLUOROSCOPIC GUIDANCE  FLUOROSCOPY TIME:  Radiation Exposure Index (as provided by the fluoroscopic device):  If the device does not provide the exposure index:  Fluoroscopy Time (in minutes and seconds):  13 seconds  Number of Acquired Images:  0  PROCEDURE: Informed consent was obtained from the patient prior to the procedure, including potential complications of headache, allergy, and pain. With the patient prone, the lower back was prepped with Betadine. 1% Lidocaine was used for local anesthesia. Lumbar puncture was performed at the L3-4 level using a 22 gauge needle with return of clear CSF with an opening pressure of 14 cm water. 12 ml of CSF were obtained for laboratory studies. The patient tolerated the procedure well and there were no apparent complications.  IMPRESSION: 1. Technically successful lumbar puncture. 12 cc of clear CSF were obtained for laboratory studies.   Electronically Signed   By: Kerby Moors M.D.   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 Cytogenetics showed Myc Break apart event associated with chromosomal variants of Burkitt's lymphoma t(2;8) and t(8;22). Patient has completed the first cycle of R -EPOCH as inpatient and tolerated it well. His LDH level is down from 1200s to 188. No evidence of ongoing tumor lysis on labs done today. Improved fatigue. Had bone pains with Neulasta shot as expected. No other prohibitive toxicities.  2)h/o previous Spontaneous TLS -current tumor lysis labs negative.  3) History of AKI ?related to contrast/urate nephropathy less likely urinary obstruction. Resolved current renal function normal. 4) Elevated transaminases - ? Related to allopurinol. resolved 5) Elevated free T4 - ? Related to his supplements vs ?thyroiditis vs central  hyperthyroidism vs thyroid involvement by same neoplastic-cn process vs paraneoplastic process.TSH borderline elevated. Free T4 has normalized. 6) Dysguesia- improved. Eating well. Has gained back some weight. Plan -no overt new toxicities from chemotherapy at this time. (D4 of 5 EPOCH-R today). Blood counts stable. -He will likely be discharged Monday morning  -Plan for outpatient Neulasta shot on 08/21/2015  -continue to monitor closely. -daily cbc, cmp, uric acid. -encouraged patient to ambulate as much as possible since he does not want lovenox for DVT prophylaxis. -SCD while in bed -incentive spirometry -Will plan to get a PET CT scan as outpatient prior to cycle 3. On 08/30/2015 -Outpatient weekly CBC, CMP -Outpatient clinic follow-up on 08/31/2015. -We'll set up for third cycle of EPOCH-R as inpatient from 09/05/2015. We will need to set of intrathecal methotrexate on day 1 and day 5 of cycle.    Sullivan Lone MD [place] AAHIVMS [facility] Hematology/Oncology Physician [facility]  (Office): [phone] (Work cell): [phone] (Fax): [phone]

## 2015-08-18 NOTE — Progress Notes (Signed)
Entered room pt chemo bag empty and pumped shut off. Pt stated, "my bag is completed."  Pt asked when did the bag complete? Pt stated, "about 20 minutes ago," Pt informed to notify nurse in future when chemo bag is completed.   Bag taken down and discarded. IVF running.

## 2015-08-19 LAB — COMPREHENSIVE METABOLIC PANEL
ALK PHOS: 57 U/L (ref 38–126)
ALT: 42 U/L (ref 17–63)
ANION GAP: 6 (ref 5–15)
AST: 25 U/L (ref 15–41)
Albumin: 3.2 g/dL — ABNORMAL LOW (ref 3.5–5.0)
BILIRUBIN TOTAL: 0.8 mg/dL (ref 0.3–1.2)
BUN: 21 mg/dL — ABNORMAL HIGH (ref 6–20)
CALCIUM: 8.6 mg/dL — AB (ref 8.9–10.3)
CO2: 28 mmol/L (ref 22–32)
Chloride: 107 mmol/L (ref 101–111)
Creatinine, Ser: 0.61 mg/dL (ref 0.61–1.24)
GFR calc non Af Amer: >60 mL/min (ref >60)
Glucose, Bld: 102 mg/dL — ABNORMAL HIGH (ref 65–99)
POTASSIUM: 3.4 mmol/L — AB (ref 3.5–5.1)
SODIUM: 141 mmol/L (ref 135–145)
TOTAL PROTEIN: 5.3 g/dL — AB (ref 6.5–8.1)

## 2015-08-19 LAB — CBC
HEMATOCRIT: 30.8 % — AB (ref 39.0–52.0)
HEMOGLOBIN: 10.7 g/dL — AB (ref 13.0–17.0)
MCH: 29.8 pg (ref 26.0–34.0)
MCHC: 34.7 g/dL (ref 30.0–36.0)
MCV: 85.8 fL (ref 78.0–100.0)
Platelets: 310 10*3/uL (ref 150–400)
RBC: 3.59 MIL/uL — AB (ref 4.22–5.81)
RDW: 12.6 % (ref 11.5–15.5)
WBC: 7 10*3/uL (ref 4.0–10.5)

## 2015-08-19 LAB — URIC ACID: URIC ACID, SERUM: 3.5 mg/dL — AB (ref 4.4–7.6)

## 2015-08-19 MED ORDER — DIPHENHYDRAMINE HCL 50 MG PO CAPS
50.0000 mg | ORAL_CAPSULE | Freq: Once | ORAL | Status: AC
  Administered 2015-08-19: 50 mg via ORAL
  Filled 2015-08-19: qty 1

## 2015-08-19 MED ORDER — ALBUTEROL SULFATE (2.5 MG/3ML) 0.083% IN NEBU: 2.5000 mg | INHALATION_SOLUTION | Freq: Once | RESPIRATORY_TRACT | Status: DC | PRN

## 2015-08-19 MED ORDER — SODIUM CHLORIDE 0.9 % IJ SOLN: 10.0000 mL | INTRAMUSCULAR | Status: DC | PRN

## 2015-08-19 MED ORDER — SODIUM CHLORIDE 0.9 % IV SOLN: Freq: Once | INTRAVENOUS | Status: DC

## 2015-08-19 MED ORDER — EPINEPHRINE HCL 1 MG/ML IJ SOLN: 0.5000 mg | Freq: Once | INTRAMUSCULAR | Status: DC | PRN

## 2015-08-19 MED ORDER — SENNOSIDES-DOCUSATE SODIUM 8.6-50 MG PO TABS: 2.0000 | ORAL_TABLET | Freq: Every evening | ORAL | Status: DC | PRN

## 2015-08-19 MED ORDER — EPINEPHRINE HCL 0.1 MG/ML IJ SOSY: 0.2500 mg | PREFILLED_SYRINGE | Freq: Once | INTRAMUSCULAR | Status: DC | PRN

## 2015-08-19 MED ORDER — HEPARIN SOD (PORK) LOCK FLUSH 100 UNIT/ML IV SOLN: 250.0000 [IU] | Freq: Once | INTRAVENOUS | Status: DC | PRN

## 2015-08-19 MED ORDER — ACETAMINOPHEN 325 MG PO TABS
650.0000 mg | ORAL_TABLET | Freq: Once | ORAL | Status: AC
  Administered 2015-08-19: 650 mg via ORAL
  Filled 2015-08-19: qty 2

## 2015-08-19 MED ORDER — SODIUM CHLORIDE 0.9 % IJ SOLN: 3.0000 mL | INTRAMUSCULAR | Status: DC | PRN

## 2015-08-19 MED ORDER — SODIUM CHLORIDE 0.9 % IV SOLN
750.0000 mg/m2 | Freq: Once | INTRAVENOUS | Status: AC
  Administered 2015-08-19: 1600 mg via INTRAVENOUS
  Filled 2015-08-19: qty 80

## 2015-08-19 MED ORDER — SODIUM CHLORIDE 0.9 % IV SOLN: Freq: Once | INTRAVENOUS | Status: DC | PRN

## 2015-08-19 MED ORDER — SODIUM CHLORIDE 0.9 % IV SOLN
375.0000 mg/m2 | Freq: Once | INTRAVENOUS | Status: AC
  Administered 2015-08-19: 800 mg via INTRAVENOUS
  Filled 2015-08-19: qty 50

## 2015-08-19 MED ORDER — ALTEPLASE 2 MG IJ SOLR: 2.0000 mg | Freq: Once | INTRAMUSCULAR | Status: DC | PRN

## 2015-08-19 MED ORDER — POLYETHYLENE GLYCOL 3350 17 G PO PACK: 17.0000 g | PACK | Freq: Every day | ORAL | Status: DC

## 2015-08-19 MED ORDER — FAMOTIDINE IN NACL 20-0.9 MG/50ML-% IV SOLN: 20.0000 mg | Freq: Once | INTRAVENOUS | Status: DC | PRN

## 2015-08-19 MED ORDER — HEPARIN SOD (PORK) LOCK FLUSH 100 UNIT/ML IV SOLN: 500.0000 [IU] | Freq: Once | INTRAVENOUS | Status: DC | PRN

## 2015-08-19 MED ORDER — ONDANSETRON HCL 40 MG/20ML IJ SOLN
8.0000 mg | Freq: Once | INTRAMUSCULAR | Status: AC
Start: 2015-08-19 — End: 2015-08-19
  Administered 2015-08-19: 8 mg via INTRAVENOUS
  Filled 2015-08-19: qty 4

## 2015-08-19 MED ORDER — METHYLPREDNISOLONE SODIUM SUCC 125 MG IJ SOLR
125.0000 mg | Freq: Once | INTRAMUSCULAR | Status: DC | PRN
Start: 2015-08-19 — End: 2015-08-19

## 2015-08-19 MED ORDER — DIPHENHYDRAMINE HCL 50 MG/ML IJ SOLN: 50.0000 mg | Freq: Once | INTRAMUSCULAR | Status: DC | PRN

## 2015-08-19 MED ORDER — DIPHENHYDRAMINE HCL 50 MG/ML IJ SOLN: 25.0000 mg | Freq: Once | INTRAMUSCULAR | Status: DC | PRN

## 2015-08-19 NOTE — Discharge Summary (Addendum)
[place]  [phone] [phone]    Physician Discharge Summary     Patient ID: Donald [name] MRN: [id number] DOB/AGE: [birth year] 39 y.o.  Admit date: 08/15/2015 Discharge date: 08/19/2015  Primary Care Physician:  Drema Pry, DO   Discharge Diagnoses:    Present on Admission:  . Burkitt lymphoma ([place])  Discharge Medications:    Medication List    STOP taking these medications        allopurinol 300 MG tablet  Commonly known as:  ZYLOPRIM     HYDROcodone-acetaminophen 7.5-325 MG tablet  Commonly known as:  NORCO     OVER THE COUNTER MEDICATION     VITAMIN C PO      TAKE these medications        lactulose 10 GM/15ML solution  Commonly known as:  CHRONULAC  Take 30 mLs by mouth 2 (two) times daily as needed. constipation     multivitamin tablet  Take 1 tablet by mouth daily.     ondansetron 8 MG tablet  Commonly known as:  ZOFRAN  Take 1 tablet (8 mg total) by mouth every 8 (eight) hours as needed for nausea or vomiting.     oxycodone 5 MG capsule  Commonly known as:  OXY-IR  Take 1 capsule (5 mg total) by mouth every 6 (six) hours as needed.     OxyCODONE 10 mg T12a 12 hr tablet  Commonly known as:  OXYCONTIN  Take 1 tablet (10 mg total) by mouth every 12 (twelve) hours.     polyethylene glycol packet  Commonly known as:  MIRALAX / GLYCOLAX  Take 17 g by mouth daily.     senna-docusate 8.6-50 MG tablet  Commonly known as:  Senokot-S  Take 2 tablets by mouth at bedtime as needed for mild constipation or moderate constipation.     sodium bicarbonate/sodium chloride Soln  1 application by Mouth Rinse route 4 (four) times daily.         Disposition and Follow-up:   #1 follow-up for Neulasta shot on 08/21/2015 of the [place] #2 PET/CT scan on 08/30/2015 as outpatient #3 follow-up with Dr. Irene Limbo in clinic on 08/31/2015 for evaluation and lab monitoring as well as determination of additional  treatment plans. #4 if appropriate PET/CT response  he would need to be admitted for [facility] cycle 3 on 09/05/15  Significant Diagnostic Studies:  Dg Chest 2 View  08/03/2015   CLINICAL DATA:  Shortness of Breath, history of Burkitt's lymphoma status post first cycle of chemotherapy  EXAM: CHEST - 2 VIEW  COMPARISON:  07/22/2015  FINDINGS: Cardiac shadow is within normal limits. A right central venous port is seen in satisfactory position. The lungs are well aerated bilaterally. No focal infiltrate or sizable effusion is seen. No acute bony abnormality is noted.  IMPRESSION: No active disease.   Electronically Signed   By: Inez Catalina M.D.   On: 08/03/2015 14:19   Nm Pet Image Initial (pi) Skull Base To Thigh  07/23/2015   CLINICAL DATA:  Initial treatment strategy for high-grade non-Hodgkin's lymphoma. B-cell lymphoma.  EXAM: NUCLEAR MEDICINE PET SKULL BASE TO THIGH  TECHNIQUE: 10.0 mCi F-18 FDG was injected intravenously. Full-ring PET imaging was performed from the skull base to thigh after the radiotracer. CT data was obtained and used for attenuation correction and anatomic localization.  FASTING BLOOD GLUCOSE:  Value: 88 mg/dl  COMPARISON:  CT abdomen 07/15/2015, CT thorax 07/14/2015  FINDINGS: NECK  Small  hypermetabolic lymph nodes the just deep to the RIGHT clavicle measure 9 mm short axis on image 54, series 4 with intense metabolic activity (SUV max 6.2).  CHEST  Small hypermetabolic internal mammary lymph nodes on the RIGHT (image 66 with SUV max equal 5.). Small hypermetabolic nodule posterior to the sternum in the anterior mediastinum on image 69 of fused data set.  Within the precordial space anterior to the liver there is a 4.5 cm hypermetabolic mass. Smaller hypermetabolic nodule anterior to the RIGHT ventricle.  ABDOMEN/PELVIS  There is a thin rim of intense metabolic activity associated entirety of the peritoneal surface consists with peritoneal metastasis / carcinomatosis. There is more  focal activity within the LEFT abdomen associated with the small bowel wall thickening. This segmental thickening measures 17 mm in single wall thickness with intense metabolic activity (SUV max 14.6). Central mesenteric mass measures 3 cm with SUV max 13.2.  There are no hypermetabolic inguinal or iliac lymph nodes.  SKELETON  No focal hypermetabolic activity to suggest skeletal metastasis.  IMPRESSION: 1. Thin rim of hypermetabolic activity throughout the peritoneal space consistent with peritoneal carcinomatosis. 2. Focal intense metabolic activity associated with the proximal small bowel consistent with lymphoma involvement of bowel. 3. Hypermetabolic mesenteric nodule  /node consistent with lymphoma. 4. Several small hypermetabolic lymph nodes in the RIGHT supraclavicular and internal mammary nodal stations. 5. Nodal metastasis anterior to the liver in the precordial space. This may be the most assessable for biopsy. 6. Normal spleen and bone marrow.   Electronically Signed   By: Suzy Bouchard M.D.   On: 07/23/2015 11:35   Ir Fluoro Guide Cv Line Right  07/27/2015   CLINICAL DATA:  New diagnosis of Burkitt's lymphoma. The patient requires a porta cath for chemotherapy.  EXAM: IMPLANTED PORT A CATH PLACEMENT WITH ULTRASOUND AND FLUOROSCOPIC GUIDANCE  ANESTHESIA/SEDATION: 6.0 Mg IV Versed; 150 mcg IV Fentanyl  Total Moderate Sedation Time:  30 minutes  Additional Medications: 2 g IV Ancef. As antibiotic prophylaxis, Ancef was ordered pre-procedure and administered intravenously within one hour of incision.  FLUOROSCOPY TIME:  24 seconds.  PROCEDURE: The procedure, risks, benefits, and alternatives were explained to the patient. Questions regarding the procedure were encouraged and answered. The patient understands and consents to the procedure. A time-out was performed prior to the procedure.  Ultrasound was used to confirm patency of the right internal jugular vein. The right neck and chest were prepped  with chlorhexidine in a sterile fashion, and a sterile drape was applied covering the operative field. Maximum barrier sterile technique with sterile gowns and gloves were used for the procedure. Local anesthesia was provided with 1% lidocaine.  After creating a small venotomy incision, a 21 gauge needle was advanced into the right internal jugular vein under direct, real-time ultrasound guidance. Ultrasound image documentation was performed. After securing guidewire access, an 8 Fr dilator was placed. A J-wire was kinked to measure appropriate catheter length.  A subcutaneous port pocket was then created along the upper chest wall utilizing sharp and blunt dissection. Portable cautery was utilized. The pocket was irrigated with sterile saline.  A single lumen power injectable port was chosen for placement. The 8 Fr catheter was tunneled from the port pocket site to the venotomy incision. The port was placed in the pocket. External catheter was trimmed to appropriate length based on guidewire measurement.  At the venotomy, an 8 Fr peel-away sheath was placed over a guidewire. The catheter was then placed through the sheath and  the sheath removed. Final catheter positioning was confirmed and documented with a fluoroscopic spot image. The port was accessed with a needle and aspirated and flushed with heparinized saline. The needle was left in place for immediate use.  The venotomy and port pocket incisions were closed with subcutaneous 3-0 Monocryl and subcuticular 4-0 Vicryl. Dermabond was applied to both incisions.  COMPLICATIONS: None  FINDINGS: After catheter placement, the tip lies at the cavoatrial junction. The catheter aspirates normally and is ready for immediate use.  IMPRESSION: Placement of single lumen port a cath via right internal jugular vein. The catheter tip lies at the cavoatrial junction. A power injectable port a cath was placed and is ready for immediate use.   Electronically Signed   By: Aletta Edouard M.D.   On: 07/27/2015 15:28   Ir US Guide Vasc Access Right  07/27/2015   CLINICAL DATA:  New diagnosis of Burkitt's lymphoma. The patient requires a porta cath for chemotherapy.  EXAM: IMPLANTED PORT A CATH PLACEMENT WITH ULTRASOUND AND FLUOROSCOPIC GUIDANCE  ANESTHESIA/SEDATION: 6.0 Mg IV Versed; 150 mcg IV Fentanyl  Total Moderate Sedation Time:  30 minutes  Additional Medications: 2 g IV Ancef. As antibiotic prophylaxis, Ancef was ordered pre-procedure and administered intravenously within one hour of incision.  FLUOROSCOPY TIME:  24 seconds.  PROCEDURE: The procedure, risks, benefits, and alternatives were explained to the patient. Questions regarding the procedure were encouraged and answered. The patient understands and consents to the procedure. A time-out was performed prior to the procedure.  Ultrasound was used to confirm patency of the right internal jugular vein. The right neck and chest were prepped with chlorhexidine in a sterile fashion, and a sterile drape was applied covering the operative field. Maximum barrier sterile technique with sterile gowns and gloves were used for the procedure. Local anesthesia was provided with 1% lidocaine.  After creating a small venotomy incision, a 21 gauge needle was advanced into the right internal jugular vein under direct, real-time ultrasound guidance. Ultrasound image documentation was performed. After securing guidewire access, an 8 Fr dilator was placed. A J-wire was kinked to measure appropriate catheter length.  A subcutaneous port pocket was then created along the upper chest wall utilizing sharp and blunt dissection. Portable cautery was utilized. The pocket was irrigated with sterile saline.  A single lumen power injectable port was chosen for placement. The 8 Fr catheter was tunneled from the port pocket site to the venotomy incision. The port was placed in the pocket. External catheter was trimmed to appropriate length based on guidewire  measurement.  At the venotomy, an 8 Fr peel-away sheath was placed over a guidewire. The catheter was then placed through the sheath and the sheath removed. Final catheter positioning was confirmed and documented with a fluoroscopic spot image. The port was accessed with a needle and aspirated and flushed with heparinized saline. The needle was left in place for immediate use.  The venotomy and port pocket incisions were closed with subcutaneous 3-0 Monocryl and subcuticular 4-0 Vicryl. Dermabond was applied to both incisions.  COMPLICATIONS: None  FINDINGS: After catheter placement, the tip lies at the cavoatrial junction. The catheter aspirates normally and is ready for immediate use.  IMPRESSION: Placement of single lumen port a cath via right internal jugular vein. The catheter tip lies at the cavoatrial junction. A power injectable port a cath was placed and is ready for immediate use.   Electronically Signed   By: Aletta Edouard M.D.   On:  07/27/2015 15:28   Ct Biopsy  07/24/2015   CLINICAL DATA:  39 year old male with a new diagnosis of lymphoma.  EXAM: CT BIOPSY  Date: 07/24/2015  PROCEDURE: 1. CT guided bone marrow aspiration and core biopsy Interventional Radiologist:  Criselda Peaches, MD  ANESTHESIA/SEDATION: Moderate (conscious) sedation was used. 2.5 mg Versed, 100 mcg Fentanyl were administered intravenously. The patient's vital signs were monitored continuously by radiology nursing throughout the procedure.  Sedation Time: 9 minutes  FLUOROSCOPY TIME:  None  TECHNIQUE: Informed consent was obtained from the patient following explanation of the procedure, risks, benefits and alternatives. The patient understands, agrees and consents for the procedure. All questions were addressed. A time out was performed.  The patient was positioned prone and noncontrast localization CT was performed of the pelvis to demonstrate the iliac marrow spaces.  Maximal barrier sterile technique utilized including  caps, mask, sterile gowns, sterile gloves, large sterile drape, hand hygiene, and betadine prep.  Under sterile conditions and local anesthesia, an 11 gauge coaxial bone biopsy needle was advanced into the right iliac marrow space. Needle position was confirmed with CT imaging. Initially, bone marrow aspiration was performed. Next, the 11 gauge outer cannula was utilized to obtain a right iliac bone marrow core biopsy. Needle was removed. Hemostasis was obtained with compression. The patient tolerated the procedure well. Samples were prepared with the cytotechnologist. No immediate complications.  IMPRESSION: CT guided right iliac bone marrow aspiration and core biopsy. The On-Control drill was used.  Signed,  Criselda Peaches, MD  [facility]  [facility]   Electronically Signed   By: Jacqulynn Cadet M.D.   On: 07/24/2015 12:32   Dg Chest Port 1 View  07/22/2015   CLINICAL DATA:  Pulmonary edema, hypoxia  EXAM: PORTABLE CHEST - 1 VIEW  COMPARISON:  07/21/2015  FINDINGS: Heart size is normal. Right-sided PICC line in place with tip over the cavoatrial junction. Hazy perihilar rate greater than left basilar airspace opacities are slightly decreased. Trace pleural effusions.  IMPRESSION: Interval slight decrease in hazy perihilar airspace opacities compatible with improving edema.   Electronically Signed   By: Conchita Paris M.D.   On: 07/22/2015 08:10   Dg Chest Port 1 View  07/21/2015   CLINICAL DATA:  Acute respiratory failure with hypoxemia. Nonsmoker.  EXAM: PORTABLE CHEST - 1 VIEW  COMPARISON:  07/20/2015.  FINDINGS: Cardiomediastinal silhouette appears within normal limits for size. Diffuse pulmonary opacities persist but are slightly improved consistent with resolving edema. Decreased BILATERAL pleural effusions. PICC line tip SVC. No pneumothorax.  IMPRESSION: Improving pulmonary edema.   Electronically Signed   By: Staci Righter M.D.   On: 07/21/2015  09:38   Dg Chest Port 1 View  07/20/2015   CLINICAL DATA:  Shortness of Breath  EXAM: PORTABLE CHEST - 1 VIEW  COMPARISON:  July 15, 2015  FINDINGS: There is alveolar edema throughout both lungs. There are small bilateral effusions. Heart is upper normal in size. There is mild pulmonary venous hypertension. No adenopathy.  IMPRESSION: Extensive edema. Question a degree of congestive heart failure versus noncardiogenic edema. Note that cardiac silhouette is unchanged compared to recent prior study. No adenopathy apparent.   Electronically Signed   By: Lowella Grip III M.D.   On: 07/20/2015 15:36   Dg Fluoro Guide Lumbar Puncture  07/24/2015   CLINICAL DATA:  Lymphoma  EXAM: DIAGNOSTIC LUMBAR PUNCTURE UNDER FLUOROSCOPIC GUIDANCE  FLUOROSCOPY TIME:  Radiation Exposure Index (as provided by the  fluoroscopic device):  If the device does not provide the exposure index:  Fluoroscopy Time (in minutes and seconds):  13 seconds  Number of Acquired Images:  0  PROCEDURE: Informed consent was obtained from the patient prior to the procedure, including potential complications of headache, allergy, and pain. With the patient prone, the lower back was prepped with Betadine. 1% Lidocaine was used for local anesthesia. Lumbar puncture was performed at the L3-4 level using a 22 gauge needle with return of clear CSF with an opening pressure of 14 cm water. 12 ml of CSF were obtained for laboratory studies. The patient tolerated the procedure well and there were no apparent complications.  IMPRESSION: 1. Technically successful lumbar puncture. 12 cc of clear CSF were obtained for laboratory studies.   Electronically Signed   By: Kerby Moors M.D.   On: 07/24/2015 12:31    Discharge Laboratory Values: . CBC Latest Ref Rng 08/19/2015 08/18/2015 08/17/2015  WBC 4.0 - 10.5 K/uL 7.0 9.6 17.8(H)  Hemoglobin 13.0 - 17.0 g/dL 10.7(L) 10.6(L) 10.7(L)  Hematocrit 39.0 - 52.0 % 30.8(L) 31.2(L) 31.9(L)  Platelets 150 - 400  K/uL 310 274 307   . CMP Latest Ref Rng 08/19/2015 08/18/2015 08/17/2015  Glucose 65 - 99 mg/dL 102(H) 90 110(H)  BUN 6 - 20 mg/dL 21(H) 18 17  Creatinine 0.61 - 1.24 mg/dL 0.61 0.68 0.89  Sodium 135 - 145 mmol/L 141 141 143  Potassium 3.5 - 5.1 mmol/L 3.4(L) 3.6 3.8  Chloride 101 - 111 mmol/L 107 108 110  CO2 22 - 32 mmol/L 28 27 27   Calcium 8.9 - 10.3 mg/dL 8.6(L) 8.4(L) 8.5(L)  Total Protein 6.5 - 8.1 g/dL 5.3(L) 5.3(L) 5.6(L)  Total Bilirubin 0.3 - 1.2 mg/dL 0.8 0.7 0.6  Alkaline Phos 38 - 126 U/L 57 58 67  AST 15 - 41 U/L 25 22 24   ALT 17 - 63 U/L 42 36 41    . Lab Results  Component Value Date   LDH 188 08/15/2015    Brief H and P: For complete details please refer to admission H and P, but in brief, patient is a pleasant 39 year old otherwise healthy gentleman who was diagnosed with high-risk work lymphoma without CNS involvement recently when he presented with right sided malignant pleural effusion and significant abdominal involvement. He was treated with his first cycle of EPOCH-R with good response and resolution of his right-sided pleural effusion as well as decreased abdominal distention and normalization of his LDH from a level of 1200 down to 188. He was admitted for his second cycle of EPOCH-R  which he has tolerated well without any acute concerns. No evidence of tumor lysis syndrome. Patient is doing well and should be able to go home today after completing his Rituxan infusion.he was counseled to send sure good oral intake and lots of oral fluids. He is to come back to the [facility] for his Neulasta shot on 08/21/2015 . We will reassess his disease status with a PET/CT scan on 08/30/2015 with a clinic visit on 08/31/2015 . He has been given clear instructions to call the clinic with any acute concerns or if he develops any fevers .   Physical Exam at Discharge: BP 116/61 mmHg  Pulse 50  Temp(Src) 98.3 F (36.8 C) (Oral)  Resp 16  Ht 5\' 11"  (1.803 m)  Wt 179 lb  6.4 oz (81.375 kg)  BMI 25.03 kg/m2  SpO2 99%  GENERAL:alert, in no acute distress and comfortable SKIN: skin color,  texture, turgor are normal, no rashes or significant lesions EYES: normal, conjunctiva are pink and non-injected, sclera clear OROPHARYNX:no exudate, no erythema and lips, buccal mucosa, and tongue normal  NECK: supple, no JVD, thyroid normal size, non-tender, without nodularity LYMPH: no palpable lymphadenopathy in the cervical, axillary or inguinal LUNGS: clear to auscultation with normal respiratory effort HEART: regular rate & rhythm, no murmurs and no lower extremity edema ABDOMEN: abdomen soft, minimal generalized tenderness to palpation, no peritoneal signs, no guarding or rigidity or rebound, normoactive bowel sounds  Musculoskeletal: no cyanosis of digits and no clubbing  PSYCH: alert & oriented x 3 with fluent speech NEURO: no focal motor/sensory deficits    Hospital Course:  Active Problems:   Burkitt lymphoma ([place])   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

## 2015-08-19 NOTE — Progress Notes (Signed)
Rituximab and Cytoxan dosage calculation checked with Clotilde Dieter RN.

## 2015-08-19 NOTE — Progress Notes (Signed)
Chemotherapy dosage calculation checked with Clotilde Dieter, RN

## 2015-08-19 NOTE — Progress Notes (Signed)
Patient discharged home, all discharge medications and instructions reviewed and questions answered.  Patient declined wheelchair assistance to vehicle, will ambulate.

## 2015-08-21 ENCOUNTER — Ambulatory Visit (HOSPITAL_BASED_OUTPATIENT_CLINIC_OR_DEPARTMENT_OTHER): Payer: BLUE CROSS/BLUE SHIELD

## 2015-08-21 VITALS — BP 116/74 | HR 73 | Temp 98.4°F | Resp 16

## 2015-08-21 DIAGNOSIS — Z5189 Encounter for other specified aftercare: Secondary | ICD-10-CM | POA: Diagnosis not present

## 2015-08-21 DIAGNOSIS — C8379 Burkitt lymphoma, extranodal and solid organ sites: Secondary | ICD-10-CM | POA: Diagnosis not present

## 2015-08-21 DIAGNOSIS — C8370 Burkitt lymphoma, unspecified site: Secondary | ICD-10-CM

## 2015-08-21 MED ORDER — PEGFILGRASTIM INJECTION 6 MG/0.6ML ~~LOC~~
6.0000 mg | PREFILLED_SYRINGE | Freq: Once | SUBCUTANEOUS | Status: AC
  Administered 2015-08-21: 6 mg via SUBCUTANEOUS
  Filled 2015-08-21: qty 0.6

## 2015-08-21 NOTE — Patient Instructions (Signed)
Pegfilgrastim injection What is this medicine? PEGFILGRASTIM (PEG fil gra stim) is a long-acting granulocyte colony-stimulating factor that stimulates the growth of neutrophils, a type of white blood cell important in the body's fight against infection. It is used to reduce the incidence of fever and infection in patients with certain types of cancer who are receiving chemotherapy that affects the bone marrow, and to increase survival after being exposed to high doses of radiation. This medicine may be used for other purposes; ask your health care provider or pharmacist if you have questions. What should I tell my health care provider before I take this medicine? They need to know if you have any of these conditions: -kidney disease -latex allergy -ongoing radiation therapy -sickle cell disease -skin reactions to acrylic adhesives (On-Body Injector only) -an unusual or allergic reaction to pegfilgrastim, filgrastim, other medicines, foods, dyes, or preservatives -pregnant or trying to get pregnant -breast-feeding How should I use this medicine? This medicine is for injection under the skin. If you get this medicine at home, you will be taught how to prepare and give the pre-filled syringe or how to use the On-body Injector. Refer to the patient Instructions for Use for detailed instructions. Use exactly as directed. Take your medicine at regular intervals. Do not take your medicine more often than directed. It is important that you put your used needles and syringes in a special sharps container. Do not put them in a trash can. If you do not have a sharps container, call your pharmacist or healthcare provider to get one. Talk to your pediatrician regarding the use of this medicine in children. While this drug may be prescribed for selected conditions, precautions do apply. Overdosage: If you think you have taken too much of this medicine contact a poison control center or emergency room at  once. NOTE: This medicine is only for you. Do not share this medicine with others. What if I miss a dose? It is important not to miss your dose. Call your doctor or health care professional if you miss your dose. If you miss a dose due to an On-body Injector failure or leakage, a new dose should be administered as soon as possible using a single prefilled syringe for manual use. What may interact with this medicine? Interactions have not been studied. Give your health care provider a list of all the medicines, herbs, non-prescription drugs, or dietary supplements you use. Also tell them if you smoke, drink alcohol, or use illegal drugs. Some items may interact with your medicine. This list may not describe all possible interactions. Give your health care provider a list of all the medicines, herbs, non-prescription drugs, or dietary supplements you use. Also tell them if you smoke, drink alcohol, or use illegal drugs. Some items may interact with your medicine. What should I watch for while using this medicine? You may need blood work done while you are taking this medicine. If you are going to need a MRI, CT scan, or other procedure, tell your doctor that you are using this medicine (On-Body Injector only). What side effects may I notice from receiving this medicine? Side effects that you should report to your doctor or health care professional as soon as possible: -allergic reactions like skin rash, itching or hives, swelling of the face, lips, or tongue -dizziness -fever -pain, redness, or irritation at site where injected -pinpoint red spots on the skin -red or dark-brown urine -shortness of breath or breathing problems -stomach or side pain, or pain   at the shoulder -swelling -tiredness -trouble passing urine or change in the amount of urine Side effects that usually do not require medical attention (report to your doctor or health care professional if they continue or are  bothersome): -bone pain -muscle pain This list may not describe all possible side effects. Call your doctor for medical advice about side effects. You may report side effects to FDA at 1-800-FDA-1088. Where should I keep my medicine? Keep out of the reach of children. Store pre-filled syringes in a refrigerator between 2 and 8 degrees C (36 and 46 degrees F). Do not freeze. Keep in carton to protect from light. Throw away this medicine if it is left out of the refrigerator for more than 48 hours. Throw away any unused medicine after the expiration date. NOTE: This sheet is a summary. It may not cover all possible information. If you have questions about this medicine, talk to your doctor, pharmacist, or health care provider.    2016, [employer]. (2014-11-16 14:30:14)  

## 2015-08-22 ENCOUNTER — Ambulatory Visit (HOSPITAL_COMMUNITY)
Admission: RE | Admit: 2015-08-22 | Discharge: 2015-08-22 | Disposition: A | Discharge status: Home / Self Care | Payer: BLUE CROSS/BLUE SHIELD | Source: Ambulatory Visit | Attending: Hematology | Admitting: Hematology

## 2015-08-22 DIAGNOSIS — Z9221 Personal history of antineoplastic chemotherapy: Secondary | ICD-10-CM | POA: Insufficient documentation

## 2015-08-22 DIAGNOSIS — C8378 Burkitt lymphoma, lymph nodes of multiple sites: Secondary | ICD-10-CM | POA: Diagnosis not present

## 2015-08-22 LAB — GLUCOSE, CAPILLARY: GLUCOSE-CAPILLARY: 94 mg/dL (ref 65–99)

## 2015-08-22 MED ORDER — FLUDEOXYGLUCOSE F - 18 (FDG) INJECTION
8.8000 | Freq: Once | INTRAVENOUS | Status: DC | PRN
  Administered 2015-08-22: 8.8 via INTRAVENOUS
  Filled 2015-08-22: qty 8.8

## 2015-08-25 ENCOUNTER — Other Ambulatory Visit: Payer: Self-pay | Admitting: Hematology

## 2015-08-25 DIAGNOSIS — C8370 Burkitt lymphoma, unspecified site: Secondary | ICD-10-CM

## 2015-08-25 DIAGNOSIS — C8378 Burkitt lymphoma, lymph nodes of multiple sites: Secondary | ICD-10-CM

## 2015-08-26 ENCOUNTER — Emergency Department (HOSPITAL_COMMUNITY)
Admission: EM | Admit: 2015-08-26 | Discharge: 2015-08-26 | Disposition: A | Discharge status: Home / Self Care | Payer: BLUE CROSS/BLUE SHIELD | Attending: Emergency Medicine | Admitting: Emergency Medicine

## 2015-08-26 ENCOUNTER — Encounter (HOSPITAL_COMMUNITY): Payer: Self-pay | Admitting: Emergency Medicine

## 2015-08-26 ENCOUNTER — Emergency Department (HOSPITAL_COMMUNITY): Payer: BLUE CROSS/BLUE SHIELD

## 2015-08-26 DIAGNOSIS — K5909 Other constipation: Secondary | ICD-10-CM | POA: Diagnosis not present

## 2015-08-26 DIAGNOSIS — T40605A Adverse effect of unspecified narcotics, initial encounter: Secondary | ICD-10-CM | POA: Diagnosis not present

## 2015-08-26 DIAGNOSIS — R140 Abdominal distension (gaseous): Secondary | ICD-10-CM | POA: Insufficient documentation

## 2015-08-26 DIAGNOSIS — R112 Nausea with vomiting, unspecified: Secondary | ICD-10-CM | POA: Diagnosis not present

## 2015-08-26 DIAGNOSIS — Z9049 Acquired absence of other specified parts of digestive tract: Secondary | ICD-10-CM | POA: Insufficient documentation

## 2015-08-26 DIAGNOSIS — Z8572 Personal history of non-Hodgkin lymphomas: Secondary | ICD-10-CM | POA: Insufficient documentation

## 2015-08-26 DIAGNOSIS — Z79899 Other long term (current) drug therapy: Secondary | ICD-10-CM | POA: Diagnosis not present

## 2015-08-26 DIAGNOSIS — Z8619 Personal history of other infectious and parasitic diseases: Secondary | ICD-10-CM | POA: Insufficient documentation

## 2015-08-26 DIAGNOSIS — R1084 Generalized abdominal pain: Secondary | ICD-10-CM | POA: Diagnosis present

## 2015-08-26 LAB — CBC WITH DIFFERENTIAL/PLATELET
BASOS ABS: 0 10*3/uL (ref 0.0–0.1)
Basophils Relative: 2 %
EOS ABS: 0 10*3/uL (ref 0.0–0.7)
Eosinophils Relative: 1 %
HEMATOCRIT: 29.1 % — AB (ref 39.0–52.0)
HEMOGLOBIN: 9.9 g/dL — AB (ref 13.0–17.0)
LYMPHS PCT: 29 %
Lymphs Abs: 0.6 10*3/uL — ABNORMAL LOW (ref 0.7–4.0)
MCH: 28.7 pg (ref 26.0–34.0)
MCHC: 34 g/dL (ref 30.0–36.0)
MCV: 84.3 fL (ref 78.0–100.0)
MONOS PCT: 27 %
Monocytes Absolute: 0.6 10*3/uL (ref 0.1–1.0)
NEUTROS PCT: 41 %
Neutro Abs: 1 10*3/uL — ABNORMAL LOW (ref 1.7–7.7)
Platelets: 155 10*3/uL (ref 150–400)
RBC: 3.45 MIL/uL — ABNORMAL LOW (ref 4.22–5.81)
RDW: 12.7 % (ref 11.5–15.5)
WBC: 2.2 10*3/uL — ABNORMAL LOW (ref 4.0–10.5)

## 2015-08-26 LAB — COMPREHENSIVE METABOLIC PANEL
ALBUMIN: 3.9 g/dL (ref 3.5–5.0)
ALT: 42 U/L (ref 17–63)
ANION GAP: 6 (ref 5–15)
AST: 19 U/L (ref 15–41)
Alkaline Phosphatase: 59 U/L (ref 38–126)
BUN: 13 mg/dL (ref 6–20)
CHLORIDE: 107 mmol/L (ref 101–111)
CO2: 26 mmol/L (ref 22–32)
Calcium: 8.6 mg/dL — ABNORMAL LOW (ref 8.9–10.3)
Creatinine, Ser: 0.65 mg/dL (ref 0.61–1.24)
GFR calc Af Amer: >60 mL/min (ref >60)
GFR calc non Af Amer: >60 mL/min (ref >60)
GLUCOSE: 99 mg/dL (ref 65–99)
POTASSIUM: 3.7 mmol/L (ref 3.5–5.1)
SODIUM: 139 mmol/L (ref 135–145)
TOTAL PROTEIN: 6.2 g/dL — AB (ref 6.5–8.1)
Total Bilirubin: 0.7 mg/dL (ref 0.3–1.2)

## 2015-08-26 LAB — LIPASE, BLOOD: Lipase: 19 U/L — ABNORMAL LOW (ref 22–51)

## 2015-08-26 MED ORDER — SODIUM CHLORIDE 0.9 % IV BOLUS (SEPSIS)
1000.0000 mL | Freq: Once | INTRAVENOUS | Status: AC
  Administered 2015-08-26: 1000 mL via INTRAVENOUS

## 2015-08-26 MED ORDER — HEPARIN SOD (PORK) LOCK FLUSH 100 UNIT/ML IV SOLN
500.0000 [IU] | Freq: Once | INTRAVENOUS | Status: AC
  Administered 2015-08-26: 500 [IU]
  Filled 2015-08-26: qty 5

## 2015-08-26 MED ORDER — ONDANSETRON HCL 4 MG/2ML IJ SOLN: 4.0000 mg | Freq: Once | INTRAMUSCULAR | Status: DC

## 2015-08-26 MED ORDER — ONDANSETRON HCL 4 MG/2ML IJ SOLN
4.0000 mg | Freq: Once | INTRAMUSCULAR | Status: AC
  Administered 2015-08-26: 4 mg via INTRAVENOUS
  Filled 2015-08-26: qty 2

## 2015-08-26 MED ORDER — IOHEXOL 300 MG/ML  SOLN
100.0000 mL | Freq: Once | INTRAMUSCULAR | Status: AC | PRN
  Administered 2015-08-26: 100 mL via INTRAVENOUS

## 2015-08-26 MED ORDER — PEG 3350-KCL-NABCB-NACL-NASULF 236 G PO SOLR: 4000.0000 mL | Freq: Once | ORAL | Status: DC

## 2015-08-26 MED ORDER — HYDROMORPHONE HCL 1 MG/ML IJ SOLN
1.0000 mg | Freq: Once | INTRAMUSCULAR | Status: AC
  Administered 2015-08-26: 1 mg via INTRAVENOUS
  Filled 2015-08-26: qty 1

## 2015-08-26 MED ORDER — SODIUM CHLORIDE 0.9 % IV SOLN: INTRAVENOUS | Status: DC

## 2015-08-26 NOTE — ED Notes (Signed)
Pt states that he has Burkitt's lymphoma.  Pt states last chemo was 3 days ago.  Last BM was 3 days ago.  States that he has been vomiting and having diffuse abd pain x 3 hrs.  Pt is writhing in pain.

## 2015-08-26 NOTE — Discharge Instructions (Signed)

## 2015-08-26 NOTE — ED Provider Notes (Addendum)
CSN: [id number]     Arrival date & time 08/26/15  1351 History   First MD Initiated Contact with Patient 08/26/15 1508     Chief Complaint  Patient presents with  . Cancer  . Abdominal Pain     (Consider location/radiation/quality/duration/timing/severity/associated sxs/prior Treatment) HPI Comments: Pt with h/o constipation and last bm was 3 days ago, pt using laxatives-- Constipation is due to opiate use for his b-cell lymphoma-- Denies h/o bowel obstruction-- Emesis her was undigested food, no blood noted Denies bloody stools  Patient is a 39 y.o. male presenting with abdominal pain. The history is provided by the patient.  Abdominal Pain Pain location:  Generalized Pain quality: aching and bloating   Pain radiates to:  Does not radiate Pain severity:  Severe Onset quality:  Sudden Duration:  4 hours Timing:  Constant Progression:  Worsening Chronicity:  New Relieved by:  Nothing Worsened by:  Nothing tried Ineffective treatments:  None tried Associated symptoms: nausea and vomiting   Associated symptoms: no diarrhea and no fever     Past Medical History  Diagnosis Date  . EBV infection 2013    Patient notes that he had an EBV infection in 2013 that left him with chronic fatigue. She also notes that he had significant lymphadenopathy and thyroiditis at the time. He has been managing his symptoms with an alternative medicine practitioner in [place] and with other alternative medicines.  . Marijuana use, episodic   . Complication of anesthesia   . Burkitt lymphoma ([place]) 08/15/2015   Past Surgical History  Procedure Laterality Date  . Tonsillectomy    . Appendectomy    . Laparoscopy N/A 07/20/2015    Procedure: LAPAROSCOPY DIAGNOSTIC, MULTIPLE BIOPSIES, DRAINAGE OF ASCITES;  Surgeon: Fanny Skates, MD;  Location: [facility];  Service: General;  Laterality: N/A;   Family History  Problem Relation Age of Onset  . Heart failure Father    Social History  Substance  Use Topics  . Smoking status: Never Smoker   . Smokeless tobacco: Never Used  . Alcohol Use: No    Review of Systems  Constitutional: Negative for fever.  Gastrointestinal: Positive for nausea, vomiting and abdominal pain. Negative for diarrhea.  All other systems reviewed and are negative.     Allergies  Levaquin and Sulfa antibiotics  Home Medications   Prior to Admission medications   Medication Sig Start Date End Date Taking? Authorizing Provider  lactulose (CHRONULAC) 10 GM/15ML solution Take 30 mLs by mouth 2 (two) times daily as needed. constipation 08/07/15  Yes Historical Provider, MD  Multiple Vitamin (MULTIVITAMIN) tablet Take 1 tablet by mouth daily.   Yes Historical Provider, MD  ondansetron (ZOFRAN) 8 MG tablet Take 1 tablet (8 mg total) by mouth every 8 (eight) hours as needed for nausea or vomiting. 08/03/15  Yes Brunetta Genera, MD  oxycodone (OXY-IR) 5 MG capsule Take 1 capsule (5 mg total) by mouth every 6 (six) hours as needed. Patient taking differently: Take 5 mg by mouth every 6 (six) hours as needed for pain.  07/30/15  Yes Belkys A Regalado, MD  OxyCODONE (OXYCONTIN) 10 mg T12A 12 hr tablet Take 1 tablet (10 mg total) by mouth every 12 (twelve) hours. 08/03/15  Yes Brunetta Genera, MD  polyethylene glycol (MIRALAX / GLYCOLAX) packet Take 17 g by mouth daily. 08/19/15  Yes Brunetta Genera, MD  sodium bicarbonate/sodium chloride SOLN 1 application by Mouth Rinse route 4 (four) times daily. 07/30/15  Yes [place],  MD  senna-docusate (SENOKOT-S) 8.6-50 MG tablet Take 2 tablets by mouth at bedtime as needed for mild constipation or moderate constipation. Patient not taking: Reported on 08/26/2015 08/19/15   Brunetta Genera, MD   BP 113/79 mmHg  Pulse 87  Temp(Src) 98.8 F (37.1 C) (Oral)  Resp 18  SpO2 100% Physical Exam  Constitutional: He is oriented to person, place, and time. He appears well-developed and well-nourished.  Non-toxic  appearance. No distress.  HENT:  Head: Normocephalic and atraumatic.  Eyes: Conjunctivae, EOM and lids are normal. Pupils are equal, round, and reactive to light.  Neck: Normal range of motion. Neck supple. No tracheal deviation present. No thyroid mass present.  Cardiovascular: Normal rate, regular rhythm and normal heart sounds.  Exam reveals no gallop.   No murmur heard. Pulmonary/Chest: Effort normal and breath sounds normal. No stridor. No respiratory distress. He has no decreased breath sounds. He has no wheezes. He has no rhonchi. He has no rales.  Abdominal: Soft. Normal appearance and bowel sounds are normal. He exhibits distension. There is generalized tenderness. There is no rigidity, no rebound, no guarding and no CVA tenderness.  Musculoskeletal: Normal range of motion. He exhibits no edema or tenderness.  Neurological: He is alert and oriented to person, place, and time. He has normal strength. No cranial nerve deficit or sensory deficit. GCS eye subscore is 4. GCS verbal subscore is 5. GCS motor subscore is 6.  Skin: Skin is warm and dry. No abrasion and no rash noted.  Psychiatric: He has a normal mood and affect. His speech is normal and behavior is normal.  Nursing note and vitals reviewed.   ED Course  Procedures (including critical care time) Labs Review Labs Reviewed  URINE CULTURE  CBC WITH DIFFERENTIAL/PLATELET  COMPREHENSIVE METABOLIC PANEL  LIPASE, BLOOD    Imaging Review No results found. I have personally reviewed and evaluated these images and lab results as part of my medical decision-making.   EKG Interpretation None      MDM   Final diagnoses:  None    Patient given IV fluids and pain meds here. Abdominal CT results discussed with constipation. Will be given a prescription for GoLYTELY and return precautions    Lacretia Leigh, MD 08/26/15 1731  Lacretia Leigh, MD 08/26/15 [phone]

## 2015-08-26 NOTE — ED Notes (Signed)
MD at bedside. 

## 2015-08-27 LAB — URINE CULTURE: CULTURE: NO GROWTH

## 2015-08-27 LAB — PATHOLOGIST SMEAR REVIEW

## 2015-08-29 LAB — AFB CULTURE WITH SMEAR (NOT AT ARMC): Acid Fast Smear: NONE SEEN

## 2015-08-31 ENCOUNTER — Telehealth: Payer: Self-pay | Admitting: Hematology

## 2015-08-31 ENCOUNTER — Ambulatory Visit (HOSPITAL_BASED_OUTPATIENT_CLINIC_OR_DEPARTMENT_OTHER): Payer: BLUE CROSS/BLUE SHIELD | Admitting: Hematology

## 2015-08-31 ENCOUNTER — Encounter: Payer: Self-pay | Admitting: Hematology

## 2015-08-31 ENCOUNTER — Other Ambulatory Visit (HOSPITAL_BASED_OUTPATIENT_CLINIC_OR_DEPARTMENT_OTHER): Payer: BLUE CROSS/BLUE SHIELD

## 2015-08-31 VITALS — BP 123/67 | HR 51 | Temp 98.3°F | Resp 20 | Ht 71.0 in | Wt 176.8 lb

## 2015-08-31 DIAGNOSIS — C8378 Burkitt lymphoma, lymph nodes of multiple sites: Secondary | ICD-10-CM

## 2015-08-31 DIAGNOSIS — E883 Tumor lysis syndrome: Secondary | ICD-10-CM

## 2015-08-31 DIAGNOSIS — R634 Abnormal weight loss: Secondary | ICD-10-CM | POA: Diagnosis not present

## 2015-08-31 DIAGNOSIS — C8370 Burkitt lymphoma, unspecified site: Secondary | ICD-10-CM

## 2015-08-31 LAB — CBC & DIFF AND RETIC
BASO%: 0.5 % (ref 0.0–2.0)
Basophils Absolute: 0.1 10*3/uL (ref 0.0–0.1)
EOS%: 0.3 % (ref 0.0–7.0)
Eosinophils Absolute: 0 10*3/uL (ref 0.0–0.5)
HCT: 31.4 % — ABNORMAL LOW (ref 38.4–49.9)
HGB: 10.4 g/dL — ABNORMAL LOW (ref 13.0–17.1)
Immature Retic Fract: 20.2 % — ABNORMAL HIGH (ref 3.00–10.60)
LYMPH%: 11 % — AB (ref 14.0–49.0)
MCH: 29.3 pg (ref 27.2–33.4)
MCHC: 33.1 g/dL (ref 32.0–36.0)
MCV: 88.5 fL (ref 79.3–98.0)
MONO#: 1.2 10*3/uL — ABNORMAL HIGH (ref 0.1–0.9)
MONO%: 7.7 % (ref 0.0–14.0)
NEUT%: 80.5 % — ABNORMAL HIGH (ref 39.0–75.0)
NEUTROS ABS: 12.5 10*3/uL — AB (ref 1.5–6.5)
Platelets: 159 10*3/uL (ref 140–400)
RBC: 3.55 10*6/uL — AB (ref 4.20–5.82)
RDW: 14.4 % (ref 11.0–14.6)
RETIC %: 4.06 % — AB (ref 0.80–1.80)
Retic Ct Abs: 144.13 10*3/uL — ABNORMAL HIGH (ref 34.80–93.90)
WBC: 15.5 10*3/uL — AB (ref 4.0–10.3)
lymph#: 1.7 10*3/uL (ref 0.9–3.3)

## 2015-08-31 LAB — MAGNESIUM (CC13): Magnesium: 2.2 mg/dl (ref 1.5–2.5)

## 2015-08-31 LAB — COMPREHENSIVE METABOLIC PANEL (CC13)
ALBUMIN: 3.6 g/dL (ref 3.5–5.0)
ALK PHOS: 79 U/L (ref 40–150)
ALT: 44 U/L (ref 0–55)
AST: 31 U/L (ref 5–34)
Anion Gap: 7 mEq/L (ref 3–11)
BUN: 8.4 mg/dL (ref 7.0–26.0)
CHLORIDE: 109 meq/L (ref 98–109)
CO2: 28 mEq/L (ref 22–29)
Calcium: 9.1 mg/dL (ref 8.4–10.4)
Creatinine: 0.8 mg/dL (ref 0.7–1.3)
GLUCOSE: 80 mg/dL (ref 70–140)
POTASSIUM: 4.1 meq/L (ref 3.5–5.1)
SODIUM: 143 meq/L (ref 136–145)
Total Bilirubin: <0.3 mg/dL (ref 0.20–1.20)
Total Protein: 5.8 g/dL — ABNORMAL LOW (ref 6.4–8.3)

## 2015-08-31 LAB — TSH CHCC: TSH: 3.069 m(IU)/L (ref 0.320–4.118)

## 2015-08-31 LAB — T4, FREE: Free T4: 0.89 ng/dL (ref 0.80–1.80)

## 2015-08-31 LAB — URIC ACID (CC13): URIC ACID, SERUM: 7.2 mg/dL (ref 2.6–7.4)

## 2015-08-31 LAB — CHCC SMEAR

## 2015-08-31 LAB — PHOSPHORUS: PHOSPHORUS: 4 mg/dL (ref 2.5–4.5)

## 2015-08-31 LAB — LACTATE DEHYDROGENASE (CC13): LDH: 314 U/L — AB (ref 125–245)

## 2015-08-31 MED ORDER — OXYCODONE HCL 5 MG PO CAPS: 5.0000 mg | ORAL_CAPSULE | Freq: Four times a day (QID) | ORAL | Status: DC | PRN

## 2015-08-31 MED ORDER — SENNOSIDES-DOCUSATE SODIUM 8.6-50 MG PO TABS: 2.0000 | ORAL_TABLET | Freq: Two times a day (BID) | ORAL | Status: DC

## 2015-08-31 NOTE — Telephone Encounter (Signed)
Gave and printed appt sched and avs for NOV °

## 2015-08-31 NOTE — Progress Notes (Signed)
[name]    HEMATOLOGY/ONCOLOGY CLINIC NOTE  Date of Service: 08/03/2015  Patient Care Team: Donald [name], DO as PCP - General (Internal Medicine)  CHIEF COMPLAINTS/PURPOSE OF CONSULTATION:  Posthospitalization follow-up for Burkitt's lymphoma  Diagnosis: Stage III Burkitt's lymphoma without CNS involvement  TREATMENT EPOCH-R + CNS prophylaxis as per NEJM 2013 Dunleavy et al. S/p 2 cycles of EPOCH-R  INTERVAL HISTORY  [name] is is here for her scheduled clinic follow-up prior to his next cycle of inpatient EPOCH-R chemotherapy. He notes that he is feeling good. Had 1 ER visit for constipation which is now resolved. Has been in ongoing issue. I reinforced that he needs to take his senna S 2 tablets twice a day to stay on top of the constipation in addition to MiraLAX. He is scheduled to be admitted on 09/05/2015 for his inpatient EPOCH-R and shall be getting day 1 and day 5 intrathecal methotrexate plus hydrocortisone for CNS prophylaxis. No fevers or chills. No shortness of breath. Abdominal pain is resolved. Notes that he didn't have as much bone pain with the Neulasta shot this time around. His fiance Donald [name] was with him at the clinic visit. No other acute new symptoms. Given refills for his oxycodone and Senna S.    MEDICAL HISTORY:  Past Medical History  Diagnosis Date  . EBV infection 2013    Patient notes that he had an EBV infection in 2013 that left him with chronic fatigue. She also notes that he had significant lymphadenopathy and thyroiditis at the time. He has been managing his symptoms with an alternative medicine practitioner in [place] and with other alternative medicines.  . Marijuana use, episodic   . Complication of anesthesia   . Burkitt lymphoma ([place]) 08/15/2015    SURGICAL HISTORY: Past Surgical History  Procedure Laterality Date  . Tonsillectomy    . Appendectomy    . Laparoscopy N/A 07/20/2015    Procedure: LAPAROSCOPY DIAGNOSTIC, MULTIPLE  BIOPSIES, DRAINAGE OF ASCITES;  Surgeon: Donald Skates, MD;  Location: [facility];  Service: General;  Laterality: N/A;    SOCIAL HISTORY: Social History   Social History  . Marital Status: Married    Spouse Name: N/A  . Number of Children: N/A  . Years of Education: N/A   Occupational History  . Not on file.   Social History Main Topics  . Smoking status: Never Smoker   . Smokeless tobacco: Never Used  . Alcohol Use: No  . Drug Use: 1.00 per week    Special: Marijuana  . Sexual Activity: Yes   Other Topics Concern  . 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   He has a fiance and has been in a steady relationship.      He has tried to maintain a very healthy lifestyle and cycles about 100 miles a week. He also uses a fair number of over-the-counter alternative medicines to stay healthy.    FAMILY HISTORY: Family History  Problem Relation Age of Onset  . Heart failure Father     ALLERGIES:  is allergic to levaquin and sulfa antibiotics.  MEDICATIONS:  Current Outpatient Prescriptions  Medication Sig Dispense Refill  . lactulose (CHRONULAC) 10 GM/15ML solution Take 30 mLs by mouth 2 (two) times daily as needed. constipation  0  . Multiple Vitamin (MULTIVITAMIN) tablet Take 1 tablet by mouth daily.    . ondansetron (ZOFRAN) 8 MG  tablet Take 1 tablet (8 mg total) by mouth every 8 (eight) hours as needed for nausea or vomiting. 30 tablet 3  . oxycodone (OXY-IR) 5 MG capsule Take 1 capsule (5 mg total) by mouth every 6 (six) hours as needed. 30 capsule 0  . OxyCODONE (OXYCONTIN) 10 mg T12A 12 hr tablet Take 1 tablet (10 mg total) by mouth every 12 (twelve) hours. 60 tablet 0  . polyethylene glycol (MIRALAX / GLYCOLAX) packet Take 17 g by mouth daily. 30 each 1  . senna-docusate (SENOKOT-S) 8.6-50 MG tablet Take 2 tablets by mouth 2 (two) times daily. 120 tablet 1  . sodium  bicarbonate/sodium chloride SOLN 1 application by Mouth Rinse route 4 (four) times daily. 100 mL 0   No current facility-administered medications for this visit.    REVIEW OF SYSTEMS:    10 Point review of Systems was done is negative except as noted above.  PHYSICAL EXAMINATION: ECOG PERFORMANCE STATUS: 1 - Symptomatic but completely ambulatory  . Filed Vitals:   08/31/15 0924  Height: 5\' 11"  (1.803 m)  Weight: 176 lb 12.8 oz (80.196 kg)   Filed Weights   08/31/15 0924  Weight: 176 lb 12.8 oz (80.196 kg)   .Body mass index is 24.67 kg/(m^2).  GENERAL:alert, in no acute distress and comfortable SKIN: skin color, texture, turgor are normal, no rashes or significant lesions EYES: normal, conjunctiva are pink and non-injected, sclera clear OROPHARYNX:no exudate, no erythema and lips, buccal mucosa, and tongue normal  NECK: supple, no JVD, thyroid normal size, non-tender, without nodularity LYMPH:  no palpable lymphadenopathy in the cervical, axillary or inguinal LUNGS: clear to auscultation with normal respiratory effort. Slightly decreased breath sounds right base. HEART: regular rate & rhythm,  no murmurs and no lower extremity edema ABDOMEN: abdomen soft, non-tender, normoactive bowel sounds  Musculoskeletal: no cyanosis of digits and no clubbing  PSYCH: alert & oriented x 3 with fluent speech NEURO: no focal motor/sensory deficits  LABORATORY DATA:  I have reviewed the data as listed  . CBC Latest Ref Rng 08/31/2015 08/26/2015 08/19/2015  WBC 4.0 - 10.3 10e3/uL 15.5(H) 2.2(L) 7.0  Hemoglobin 13.0 - 17.1 g/dL 10.4(L) 9.9(L) 10.7(L)  Hematocrit 38.4 - 49.9 % 31.4(L) 29.1(L) 30.8(L)  Platelets 140 - 400 10e3/uL 159 155 310    . CMP Latest Ref Rng 08/31/2015 08/26/2015 08/19/2015  Glucose 70 - 140 mg/dl 80 99 102(H)  BUN 7.0 - 26.0 mg/dL 8.4 13 21(H)  Creatinine 0.7 - 1.3 mg/dL 0.8 0.65 0.61  Sodium 136 - 145 mEq/L 143 139 141  Potassium 3.5 - 5.1 mEq/L 4.1 3.7 3.4(L)   Chloride 101 - 111 mmol/L - 107 107  CO2 22 - 29 mEq/L 28 26 28   Calcium 8.4 - 10.4 mg/dL 9.1 8.6(L) 8.6(L)  Total Protein 6.4 - 8.3 g/dL 5.8(L) 6.2(L) 5.3(L)  Total Bilirubin 0.20 - 1.20 mg/dL <0.30 0.7 0.8  Alkaline Phos 40 - 150 U/L 79 59 57  AST 5 - 34 U/L 31 19 25   ALT 0 - 55 U/L 44 42 42   . LDH 188  RADIOGRAPHIC STUDIES: I have personally reviewed the radiological images as listed and agreed with the findings in the report. Dg Chest 2 View  08/03/2015  CLINICAL DATA:  Shortness of Breath, history of Burkitt's lymphoma status post first cycle of chemotherapy EXAM: CHEST - 2 VIEW COMPARISON:  07/22/2015 FINDINGS: Cardiac shadow is within normal limits. A right central venous port is seen in satisfactory position. The lungs are  well aerated bilaterally. No focal infiltrate or sizable effusion is seen. No acute bony abnormality is noted. IMPRESSION: No active disease. Electronically Signed   By: Inez Catalina M.D.   On: 08/03/2015 14:19   Ct Abdomen Pelvis W Contrast  08/26/2015  CLINICAL DATA:  Vomiting, diffuse abdominal pain, chemotherapy for lymphoma 3 days ago EXAM: CT ABDOMEN AND PELVIS WITH CONTRAST TECHNIQUE: Multidetector CT imaging of the abdomen and pelvis was performed using the standard protocol following bolus administration of intravenous contrast. CONTRAST:  143mL OMNIPAQUE IOHEXOL 300 MG/ML  SOLN COMPARISON:  08/22/2015 FINDINGS: Lung bases are unremarkable. Sagittal images of the spine are unremarkable. No calcified gallstones are noted within gallbladder. Liver, spleen, pancreas and adrenal glands are unremarkable. No aortic aneurysm. Kidneys are symmetrical in size and enhancement. No hydronephrosis or hydroureter. Delayed renal images shows bilateral renal symmetrical excretion. Bilateral visualized proximal ureter is unremarkable. Mild dilated small bowel loops distal abdomen with some air-fluid level. There is no transition point in caliber of small bowel. Findings  suspicious for mild ileus or enteritis. Abundant stool noted in right colon transverse colon. Moderate stool noted in descending colon. Small amount of free fluid noted in right paracolic gutter. No colitis or diverticulitis. No free abdominal air. Prostate gland and seminal vesicles are unremarkable. Nonspecific mild thickening of urinary bladder wall. There is no inguinal adenopathy. No destructive bony lesions are noted within pelvis. The patient is status post appendectomy. IMPRESSION: 1. Mild distended small bowel loops in lower abdomen with some air-fluid levels without transition point in caliber. Findings suspicious for ileus or enteritis. 2. Abundant stool noted in right colon and transverse colon. Moderate stool noted in descending colon. Small amount of free fluid in right paracolic gutter. 3. No hydronephrosis or hydroureter. Bilateral renal symmetrical excretion. Electronically Signed   By: Lahoma Crocker M.D.   On: 08/26/2015 17:09   Nm Pet Image Restag (ps) Skull Base To Thigh  08/22/2015  CLINICAL DATA:  Subsequent treatment strategy for Burkitt's lymphoma status post 2 cycles chemotherapy. EXAM: NUCLEAR MEDICINE PET SKULL BASE TO THIGH TECHNIQUE: 8.8 mCi F-18 FDG was injected intravenously. Full-ring PET imaging was performed from the skull base to thigh after the radiotracer. CT data was obtained and used for attenuation correction and anatomic localization. FASTING BLOOD GLUCOSE:  Value: 94 mg/dl COMPARISON:  07/23/2015 PET-CT. FINDINGS: NECK No hypermetabolic lymph nodes in the neck. Previously described right retroclavicular lymphadenopathy has resolved, with no residual hypermetabolism and no residual adenopathy on the CT. CHEST No hypermetabolic axillary, mediastinal or hilar nodes. Previously described right internal mammary and anterior mediastinal lymphadenopathy has resolved, with no residual hypermetabolism and no residual adenopathy on the CT. Right internal jugular MediPort terminates  at the cavoatrial junction. No acute consolidative airspace disease or significant pulmonary nodules. ABDOMEN/PELVIS The previously described diffuse peritoneal thickening and hypermetabolism has completely resolved. The previously described hypermetabolic wall thickening in the proximal small bowel loop in the left abdomen has completely resolved. There is complete metabolic resolution of the previously described central mesenteric adenopathy, with a residual 1.1 cm central mesenteric node (series 4/image 133), significantly decreased from 3.0 cm. There is mild homogeneous hypermetabolism throughout the normal size spleen (max splenic SUV 5.9, previously 4.0), with no splenic mass, most in keeping with reactive splenic uptake given the reactive marrow uptake throughout the skeleton. No abnormal hypermetabolic activity within the liver, pancreas or adrenal glands. No hypermetabolic lymph nodes in the abdomen or pelvis. Nonobstructing 2 mm stone in the right lower kidney.  No hydronephrosis. Stable mild prostatomegaly with nonspecific internal prostatic calcification. A small focus of hypermetabolism in the central prostate is favored to represent hypermetabolic urine within the prostatic urethra. SKELETON There is new homogeneous hypermetabolism throughout the visualized skeleton, most prominent in the thoracolumbar spine vertebral bodies, in keeping with benign reactive marrow uptake. No focal hypermetabolic activity to suggest skeletal metastasis. IMPRESSION: 1. Complete metabolic response. No residual hypermetabolic lymphoma. 2. Benign reactive splenic and diffuse marrow uptake in the setting of ongoing chemotherapy. Spleen is normal size with no apparent splenic masses. 3. Nonobstructing 2 mm right lower renal stone. Electronically Signed   By: Ilona Sorrel M.D.   On: 08/22/2015 13:11   ASSESSMENT & PLAN:   Patient is a 39 yo male admitted with   1) High risk Burkitt's lymphoma stage III with no CNS  involvement. 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 Patient has completed the first cycle of R -EPOCH as inpatient and tolerated it well. His LDH level is down from 1200s to 300 to 188 No evidence of ongoing tumor lysis on labs done today. PET/CT 66/44/0347 Complete metabolic response. No residual hypermetabolic lymphoma.  2) Post-operative hypoxic respiratory failure with CXR showing b/l infiltrates ?Fluid overload vs ARDS from tumor lysis syndrome triggered by anesthesia/tissue handing. TLS labs stable. Likely fluid overload. Resolved with some diuresis. X-ray today at follow-up shows no acute infiltrates or significant pleural effusions.  2) s/p Spontaneous TLS -Labs today are stable. off allopurinol after the first cycle.  3) AKI ?related to contrast/urate nephropathy less likely urinary obstruction. Resolved  4) Elevated transaminases - ? Related to allopurinol. Resolving. Improving albumin levels.  5) Elevated free T4 - ? Related to his supplements vs ?thyroiditis vs central hyperthyroidism vs thyroid involvement by same neoplastic-cn process vs paraneoplastic process. Now normalized. 6) Dysguesia better. Plan -Patient counseled to continue focusing on his physical activity level and good oral intake . He has been eating well and has maintained/gain some weight. -Will plan to admit the patient on 09/05/2015 for a third cycle cycle of EPOCH R  -Will plan to start CNS prophylaxis with intrathecal methotrexate from cycle 3 as per [facility] et al NEJM 2013 on day 1 and day 5. This has been ordered. -Given issues with constipation aggressive Bowel prophylaxis with senna S and MiraLAX. -Might use magnesium citrate if needed in addition. -Given refill of oxycodone for pain management. -Since I will be out of town on 09/05/2015 I will ask one of my other colleagues to monitor the patient while admitted. Patient was  made aware of this. All the chemotherapy orders have been placed and signed. -Neulasta day 7 as outpatient. - Follow up in clinic with me about one week after discharge with repeat CBC, CMP, LDH, uric acid.  All of the patients questions were answered with apparent satisfaction. The patient knows to call the clinic with any problems, questions or concerns.  I spent 25 minutes counseling the patient face to face. The total time spent in the appointment was 40 minutes and more than 50% was on counseling and direct patient cares.    Sullivan Lone MD [place] AAHIVMS [facility] Hematology/Oncology Physician [place]  (Office):       [phone] (Work cell):  [phone] (Fax):           [phone]

## 2015-09-05 ENCOUNTER — Encounter (HOSPITAL_COMMUNITY): Payer: Self-pay | Admitting: *Deleted

## 2015-09-05 ENCOUNTER — Inpatient Hospital Stay (HOSPITAL_COMMUNITY)
Admission: AD | Admit: 2015-09-05 | Discharge: 2015-09-12 | DRG: 847 | Disposition: A | Discharge status: Home / Self Care | Payer: BLUE CROSS/BLUE SHIELD | Source: Ambulatory Visit | Attending: Hematology | Admitting: Hematology

## 2015-09-05 DIAGNOSIS — K56609 Unspecified intestinal obstruction, unspecified as to partial versus complete obstruction: Secondary | ICD-10-CM

## 2015-09-05 DIAGNOSIS — Z881 Allergy status to other antibiotic agents status: Secondary | ICD-10-CM

## 2015-09-05 DIAGNOSIS — Z5111 Encounter for antineoplastic chemotherapy: Principal | ICD-10-CM

## 2015-09-05 DIAGNOSIS — C8370 Burkitt lymphoma, unspecified site: Secondary | ICD-10-CM

## 2015-09-05 DIAGNOSIS — R110 Nausea: Secondary | ICD-10-CM

## 2015-09-05 DIAGNOSIS — Z882 Allergy status to sulfonamides status: Secondary | ICD-10-CM | POA: Diagnosis not present

## 2015-09-05 DIAGNOSIS — C8590 Non-Hodgkin lymphoma, unspecified, unspecified site: Secondary | ICD-10-CM | POA: Diagnosis present

## 2015-09-05 DIAGNOSIS — G971 Other reaction to spinal and lumbar puncture: Secondary | ICD-10-CM

## 2015-09-05 DIAGNOSIS — IMO0001 Reserved for inherently not codable concepts without codable children: Secondary | ICD-10-CM

## 2015-09-05 DIAGNOSIS — R519 Headache, unspecified: Secondary | ICD-10-CM

## 2015-09-05 DIAGNOSIS — R51 Headache: Secondary | ICD-10-CM | POA: Diagnosis not present

## 2015-09-05 DIAGNOSIS — Z8249 Family history of ischemic heart disease and other diseases of the circulatory system: Secondary | ICD-10-CM | POA: Diagnosis not present

## 2015-09-05 DIAGNOSIS — T451X5A Adverse effect of antineoplastic and immunosuppressive drugs, initial encounter: Secondary | ICD-10-CM | POA: Diagnosis not present

## 2015-09-05 DIAGNOSIS — Y844 Aspiration of fluid as the cause of abnormal reaction of the patient, or of later complication, without mention of misadventure at the time of the procedure: Secondary | ICD-10-CM | POA: Diagnosis not present

## 2015-09-05 DIAGNOSIS — G960 Cerebrospinal fluid leak: Secondary | ICD-10-CM | POA: Diagnosis not present

## 2015-09-05 DIAGNOSIS — K5900 Constipation, unspecified: Secondary | ICD-10-CM | POA: Diagnosis present

## 2015-09-05 DIAGNOSIS — D6481 Anemia due to antineoplastic chemotherapy: Secondary | ICD-10-CM

## 2015-09-05 DIAGNOSIS — T40605A Adverse effect of unspecified narcotics, initial encounter: Secondary | ICD-10-CM | POA: Diagnosis not present

## 2015-09-05 DIAGNOSIS — R1110 Vomiting, unspecified: Secondary | ICD-10-CM | POA: Insufficient documentation

## 2015-09-05 DIAGNOSIS — K5909 Other constipation: Secondary | ICD-10-CM | POA: Diagnosis not present

## 2015-09-05 DIAGNOSIS — R7989 Other specified abnormal findings of blood chemistry: Secondary | ICD-10-CM | POA: Diagnosis not present

## 2015-09-05 DIAGNOSIS — R740 Nonspecific elevation of levels of transaminase and lactic acid dehydrogenase [LDH]: Secondary | ICD-10-CM | POA: Diagnosis not present

## 2015-09-05 DIAGNOSIS — G970 Cerebrospinal fluid leak from spinal puncture: Secondary | ICD-10-CM

## 2015-09-05 DIAGNOSIS — R112 Nausea with vomiting, unspecified: Secondary | ICD-10-CM

## 2015-09-05 DIAGNOSIS — D649 Anemia, unspecified: Secondary | ICD-10-CM | POA: Diagnosis not present

## 2015-09-05 DIAGNOSIS — R140 Abdominal distension (gaseous): Secondary | ICD-10-CM

## 2015-09-05 DIAGNOSIS — C8378 Burkitt lymphoma, lymph nodes of multiple sites: Secondary | ICD-10-CM

## 2015-09-05 DIAGNOSIS — Z7901 Long term (current) use of anticoagulants: Secondary | ICD-10-CM | POA: Diagnosis not present

## 2015-09-05 LAB — RETICULOCYTES
RBC.: 3.52 MIL/uL — AB (ref 4.22–5.81)
RETIC COUNT ABSOLUTE: 95 10*3/uL (ref 19.0–186.0)
Retic Ct Pct: 2.7 % (ref 0.4–3.1)

## 2015-09-05 LAB — CBC WITH DIFFERENTIAL/PLATELET
BASOS ABS: 0.1 10*3/uL (ref 0.0–0.1)
BASOS PCT: 1 %
Eosinophils Absolute: 0 10*3/uL (ref 0.0–0.7)
Eosinophils Relative: 0 %
HEMATOCRIT: 31.6 % — AB (ref 39.0–52.0)
HEMOGLOBIN: 10.4 g/dL — AB (ref 13.0–17.0)
Lymphocytes Relative: 16 %
Lymphs Abs: 1.5 10*3/uL (ref 0.7–4.0)
MCH: 29.5 pg (ref 26.0–34.0)
MCHC: 32.9 g/dL (ref 30.0–36.0)
MCV: 89.8 fL (ref 78.0–100.0)
Monocytes Absolute: 0.8 10*3/uL (ref 0.1–1.0)
Monocytes Relative: 9 %
NEUTROS ABS: 7.2 10*3/uL (ref 1.7–7.7)
NEUTROS PCT: 74 %
Platelets: 275 10*3/uL (ref 150–400)
RBC: 3.52 MIL/uL — AB (ref 4.22–5.81)
RDW: 15.3 % (ref 11.5–15.5)
WBC: 9.6 10*3/uL (ref 4.0–10.5)

## 2015-09-05 LAB — URIC ACID: Uric Acid, Serum: 4.5 mg/dL (ref 4.4–7.6)

## 2015-09-05 LAB — PROTIME-INR
INR: 1.09 (ref 0.00–1.49)
PROTHROMBIN TIME: 14.2 s (ref 11.6–15.2)

## 2015-09-05 LAB — COMPREHENSIVE METABOLIC PANEL
ALK PHOS: 63 U/L (ref 38–126)
ALT: 28 U/L (ref 17–63)
ANION GAP: 7 (ref 5–15)
AST: 23 U/L (ref 15–41)
Albumin: 3.6 g/dL (ref 3.5–5.0)
BILIRUBIN TOTAL: 0.3 mg/dL (ref 0.3–1.2)
BUN: 12 mg/dL (ref 6–20)
CALCIUM: 8.6 mg/dL — AB (ref 8.9–10.3)
CO2: 30 mmol/L (ref 22–32)
Chloride: 105 mmol/L (ref 101–111)
Creatinine, Ser: 0.89 mg/dL (ref 0.61–1.24)
GFR calc non Af Amer: >60 mL/min (ref >60)
Glucose, Bld: 107 mg/dL — ABNORMAL HIGH (ref 65–99)
Potassium: 3.7 mmol/L (ref 3.5–5.1)
Sodium: 142 mmol/L (ref 135–145)
TOTAL PROTEIN: 5.8 g/dL — AB (ref 6.5–8.1)

## 2015-09-05 LAB — LACTATE DEHYDROGENASE: LDH: 160 U/L (ref 98–192)

## 2015-09-05 LAB — APTT: aPTT: 32 seconds (ref 24–37)

## 2015-09-05 MED ORDER — DOCUSATE SODIUM 100 MG PO CAPS
100.0000 mg | ORAL_CAPSULE | Freq: Two times a day (BID) | ORAL | Status: DC
  Administered 2015-09-05 – 2015-09-08 (×7): 100 mg via ORAL
  Filled 2015-09-05 (×8): qty 1

## 2015-09-05 MED ORDER — ONDANSETRON HCL 4 MG/2ML IJ SOLN
4.0000 mg | Freq: Three times a day (TID) | INTRAMUSCULAR | Status: DC | PRN
  Administered 2015-09-09 – 2015-09-11 (×4): 4 mg via INTRAVENOUS
  Filled 2015-09-05 (×5): qty 2

## 2015-09-05 MED ORDER — HYDROCODONE-ACETAMINOPHEN 5-325 MG PO TABS: 1.0000 | ORAL_TABLET | ORAL | Status: DC | PRN

## 2015-09-05 MED ORDER — SENNOSIDES-DOCUSATE SODIUM 8.6-50 MG PO TABS: 1.0000 | ORAL_TABLET | Freq: Every evening | ORAL | Status: DC | PRN

## 2015-09-05 MED ORDER — SENNOSIDES-DOCUSATE SODIUM 8.6-50 MG PO TABS: 2.0000 | ORAL_TABLET | Freq: Every evening | ORAL | Status: DC | PRN

## 2015-09-05 MED ORDER — ONDANSETRON HCL 4 MG PO TABS: 4.0000 mg | ORAL_TABLET | Freq: Three times a day (TID) | ORAL | Status: DC | PRN

## 2015-09-05 MED ORDER — ACETAMINOPHEN 325 MG PO TABS: 650.0000 mg | ORAL_TABLET | ORAL | Status: DC | PRN

## 2015-09-05 MED ORDER — POLYETHYLENE GLYCOL 3350 17 G PO PACK
17.0000 g | PACK | Freq: Every day | ORAL | Status: DC
  Administered 2015-09-06 – 2015-09-08 (×3): 17 g via ORAL
  Filled 2015-09-05 (×4): qty 1

## 2015-09-05 MED ORDER — SODIUM CHLORIDE 0.9 % IV SOLN
8.0000 mg | Freq: Three times a day (TID) | INTRAVENOUS | Status: DC | PRN
  Filled 2015-09-05: qty 4

## 2015-09-05 MED ORDER — ENOXAPARIN SODIUM 30 MG/0.3ML ~~LOC~~ SOLN
30.0000 mg | SUBCUTANEOUS | Status: DC
  Filled 2015-09-05 (×2): qty 0.3

## 2015-09-05 MED ORDER — ONDANSETRON 4 MG PO TBDP
4.0000 mg | ORAL_TABLET | Freq: Three times a day (TID) | ORAL | Status: DC | PRN
  Administered 2015-09-08: 8 mg via ORAL
  Filled 2015-09-05: qty 1
  Filled 2015-09-05: qty 2

## 2015-09-05 NOTE — H&P (Signed)
[facility] CONSULT NOTE  Patient Care Team: [name], DO as PCP - General (Internal Medicine)  HISTORY AND PHYSICAL:   CC: Admission for cycle 3 EPOCH-R  HISTORY OF PRESENTING ILLNESS:  Donald [name] 39 y.o. male is here for cycle 3 EPOCH-R chemo for Burkitts lymphoma. He had 2 cycles of chemo and had a PET-CT which showed an excellent response to treatment. He had constipation as well as fatigue as side effects of prior cycles of chemo. He will be getting CNS prophylaxis starting from this cycle.   I reviewed her records extensively and collaborated the history with the patient.  MEDICAL HISTORY:  Past Medical History  Diagnosis Date  . EBV infection 2013    Patient notes that he had an EBV infection in 2013 that left him with chronic fatigue. She also notes that he had significant lymphadenopathy and thyroiditis at the time. He has been managing his symptoms with an alternative medicine practitioner in [place] and with other alternative medicines.  . Marijuana use, episodic   . Complication of anesthesia   . Burkitt lymphoma ([place]) 08/15/2015    SURGICAL HISTORY: Past Surgical History  Procedure Laterality Date  . Tonsillectomy    . Appendectomy    . Laparoscopy N/A 07/20/2015    Procedure: LAPAROSCOPY DIAGNOSTIC, MULTIPLE BIOPSIES, DRAINAGE OF ASCITES;  Surgeon: Fanny Skates, MD;  Location: [facility];  Service: General;  Laterality: N/A;    SOCIAL HISTORY: Social History   Social History  . Marital Status: Married    Spouse Name: N/A  . Number of Children: N/A  . Years of Education: N/A   Occupational History  . Not on file.   Social History Main Topics  . Smoking status: Never Smoker   . Smokeless tobacco: Never Used  . Alcohol Use: No  . Drug Use: 1.00 per week    Special: Marijuana  . Sexual Activity: Yes   Other Topics Concern  . 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   He has a fiance and has been in a steady relationship.      He has tried to maintain a very healthy lifestyle and cycles about 100 miles a week. He also uses a fair number of over-the-counter alternative medicines to stay healthy.    FAMILY HISTORY: Family History  Problem Relation Age of Onset  . Heart failure Father     ALLERGIES:  is allergic to levaquin and sulfa antibiotics.  MEDICATIONS:  Current Facility-Administered Medications  Medication Dose Route Frequency Provider Last Rate Last Dose  . acetaminophen (TYLENOL) tablet 650 mg  650 mg Oral Q4H PRN Nicholas Lose, MD      . docusate sodium (COLACE) capsule 100 mg  100 mg Oral BID Nicholas Lose, MD      . enoxaparin (LOVENOX) injection 30 mg  30 mg Subcutaneous Q24H Nicholas Lose, MD      . HYDROcodone-acetaminophen (NORCO/VICODIN) 5-325 MG per tablet 1-2 tablet  1-2 tablet Oral Q4H PRN Nicholas Lose, MD      . ondansetron (ZOFRAN) tablet 4-8 mg  4-8 mg Oral Q8H PRN Nicholas Lose, MD       Or  . ondansetron (ZOFRAN-ODT) disintegrating tablet 4-8 mg  4-8 mg Oral Q8H PRN Nicholas Lose, MD       Or  . ondansetron (ZOFRAN) injection 4 mg  4 mg Intravenous Q8H PRN Nicholas Lose,  MD       Or  . ondansetron (ZOFRAN) 8 mg in sodium chloride 0.9 % 50 mL IVPB  8 mg Intravenous Q8H PRN Nicholas Lose, MD      . Derrill Memo ON 09/06/2015] polyethylene glycol (MIRALAX / GLYCOLAX) packet 17 g  17 g Oral Daily Nicholas Lose, MD      . senna-docusate (Senokot-S) tablet 2 tablet  2 tablet Oral QHS PRN Nicholas Lose, MD        REVIEW OF SYSTEMS:   Constitutional: Denies fevers, chills or abnormal night sweats Eyes: Denies blurriness of vision, double vision or watery eyes Ears, nose, mouth, throat, and face: Denies mucositis or sore throat Respiratory: Denies cough, dyspnea or wheezes Cardiovascular: Denies palpitation, chest discomfort or lower extremity swelling Gastrointestinal:  Denies nausea, heartburn or change in  bowel habits Skin: Denies abnormal skin rashes Lymphatics: Denies new lymphadenopathy or easy bruising Neurological:Denies numbness, tingling or new weaknesses Behavioral/Psych: Mood is stable, no new changes  Alopecia All other systems were reviewed with the patient and are negative.  PHYSICAL EXAMINATION: ECOG PERFORMANCE STATUS: 0  Filed Vitals:   09/05/15 1140  BP: 106/58  Pulse: 55  Temp: 98.2 F (36.8 C)  Resp: 20   Filed Weights   09/05/15 1140  Weight: 180 lb 8.9 oz (81.9 kg)    GENERAL:alert, no distress and comfortable SKIN: skin color, texture, turgor are normal, no rashes or significant lesions EYES: normal, conjunctiva are pink and non-injected, sclera clear OROPHARYNX:no exudate, no erythema and lips, buccal mucosa, and tongue normal  NECK: supple, thyroid normal size, non-tender, without nodularity LYMPH:  no palpable lymphadenopathy in the cervical, axillary or inguinal LUNGS: clear to auscultation and percussion with normal breathing effort HEART: regular rate & rhythm and no murmurs and no lower extremity edema ABDOMEN:abdomen soft, non-tender and normal bowel sounds Musculoskeletal:no cyanosis of digits and no clubbing  PSYCH: alert & oriented x 3 with fluent speech NEURO: no focal motor/sensory deficits  LABORATORY DATA:  I have reviewed the data as listed Lab Results  Component Value Date   WBC 9.6 09/05/2015   HGB 10.4* 09/05/2015   HCT 31.6* 09/05/2015   MCV 89.8 09/05/2015   PLT 275 09/05/2015   Lab Results  Component Value Date   NA 142 09/05/2015   K 3.7 09/05/2015   CL 105 09/05/2015   CO2 30 09/05/2015    RADIOGRAPHIC STUDIES: I have personally reviewed the radiological reports and agreed with the findings in the report.  ASSESSMENT AND PLAN:  1. Burkitts Lymphoma: to start cycle 3 EPOCH-R chemo. He will get IV hydration today and will start chemo tomorrow. He will also get IT methotrexate starting tomorrow 2. His Uric acid was  recently slightly elevated. I will get uric acid with tomorrows labs 3. elevated LDH: being watched 4. Anemia Normocytic related to chemo DVT prophylaxis Patient will be in the hospital for 6 days.    Rulon Eisenmenger, MD 9:08 PM

## 2015-09-06 ENCOUNTER — Ambulatory Visit (HOSPITAL_COMMUNITY)
Admission: AD | Admit: 2015-09-06 | Discharge: 2015-09-06 | Disposition: A | Discharge status: Home / Self Care | Payer: BLUE CROSS/BLUE SHIELD | Source: Ambulatory Visit | Attending: Hematology | Admitting: Hematology

## 2015-09-06 ENCOUNTER — Encounter: Payer: Self-pay | Admitting: *Deleted

## 2015-09-06 LAB — COMPREHENSIVE METABOLIC PANEL
ALBUMIN: 3.5 g/dL (ref 3.5–5.0)
ALK PHOS: 50 U/L (ref 38–126)
ALT: 25 U/L (ref 17–63)
ANION GAP: 5 (ref 5–15)
AST: 20 U/L (ref 15–41)
BILIRUBIN TOTAL: 0.2 mg/dL — AB (ref 0.3–1.2)
BUN: 15 mg/dL (ref 6–20)
CALCIUM: 8.5 mg/dL — AB (ref 8.9–10.3)
CO2: 27 mmol/L (ref 22–32)
CREATININE: 0.73 mg/dL (ref 0.61–1.24)
Chloride: 106 mmol/L (ref 101–111)
GFR calc non Af Amer: >60 mL/min (ref >60)
GLUCOSE: 91 mg/dL (ref 65–99)
Potassium: 4 mmol/L (ref 3.5–5.1)
SODIUM: 138 mmol/L (ref 135–145)
TOTAL PROTEIN: 5.5 g/dL — AB (ref 6.5–8.1)

## 2015-09-06 LAB — CSF CELL COUNT WITH DIFFERENTIAL
RBC COUNT CSF: 2 /mm3 — AB
Tube #: 4
WBC CSF: 1 /mm3 (ref 0–5)

## 2015-09-06 LAB — URIC ACID: Uric Acid, Serum: 4 mg/dL — ABNORMAL LOW (ref 4.4–7.6)

## 2015-09-06 LAB — CBC WITH DIFFERENTIAL/PLATELET
Basophils Absolute: 0.1 10*3/uL (ref 0.0–0.1)
Basophils Relative: 1 %
Eosinophils Absolute: 0 10*3/uL (ref 0.0–0.7)
Eosinophils Relative: 0 %
HEMATOCRIT: 31.3 % — AB (ref 39.0–52.0)
HEMOGLOBIN: 10 g/dL — AB (ref 13.0–17.0)
LYMPHS ABS: 1.5 10*3/uL (ref 0.7–4.0)
Lymphocytes Relative: 22 %
MCH: 28.7 pg (ref 26.0–34.0)
MCHC: 31.9 g/dL (ref 30.0–36.0)
MCV: 89.7 fL (ref 78.0–100.0)
MONOS PCT: 12 %
Monocytes Absolute: 0.8 10*3/uL (ref 0.1–1.0)
NEUTROS ABS: 4.4 10*3/uL (ref 1.7–7.7)
NEUTROS PCT: 65 %
Platelets: 239 10*3/uL (ref 150–400)
RBC: 3.49 MIL/uL — AB (ref 4.22–5.81)
RDW: 15.1 % (ref 11.5–15.5)
WBC: 6.7 10*3/uL (ref 4.0–10.5)

## 2015-09-06 LAB — GRAM STAIN

## 2015-09-06 LAB — GLUCOSE, CSF: GLUCOSE CSF: 58 mg/dL (ref 40–70)

## 2015-09-06 LAB — LACTATE DEHYDROGENASE: LDH: 117 U/L (ref 98–192)

## 2015-09-06 LAB — PROTEIN, CSF: Total  Protein, CSF: 23 mg/dL (ref 15–45)

## 2015-09-06 MED ORDER — VINCRISTINE SULFATE CHEMO INJECTION 1 MG/ML
Freq: Once | INTRAVENOUS | Status: AC
  Administered 2015-09-06: 11:00:00 via INTRAVENOUS
  Filled 2015-09-06: qty 11

## 2015-09-06 MED ORDER — HEPARIN SOD (PORK) LOCK FLUSH 100 UNIT/ML IV SOLN: 500.0000 [IU] | Freq: Once | INTRAVENOUS | Status: DC | PRN

## 2015-09-06 MED ORDER — COLD PACK MISC ONCOLOGY
1.0000 | Freq: Once | Status: DC | PRN
  Filled 2015-09-06: qty 1

## 2015-09-06 MED ORDER — HOT PACK MISC ONCOLOGY
1.0000 | Freq: Once | Status: DC | PRN
  Filled 2015-09-06: qty 1

## 2015-09-06 MED ORDER — METHOTREXATE SODIUM CHEMO INJECTION (PF) 50 MG/2ML
Freq: Once | INTRAMUSCULAR | Status: AC
  Administered 2015-09-06: 14:00:00 via INTRATHECAL
  Filled 2015-09-06: qty 0.48

## 2015-09-06 MED ORDER — PREDNISONE 50 MG PO TABS
120.0000 mg | ORAL_TABLET | Freq: Every day | ORAL | Status: AC
  Administered 2015-09-06 – 2015-09-08 (×3): 120 mg via ORAL
  Filled 2015-09-06 (×2): qty 1
  Filled 2015-09-06 (×2): qty 2
  Filled 2015-09-06: qty 1
  Filled 2015-09-06: qty 2
  Filled 2015-09-06: qty 1
  Filled 2015-09-06 (×2): qty 2
  Filled 2015-09-06: qty 1

## 2015-09-06 MED ORDER — ALTEPLASE 2 MG IJ SOLR
2.0000 mg | Freq: Once | INTRAMUSCULAR | Status: DC | PRN
  Filled 2015-09-06: qty 2

## 2015-09-06 MED ORDER — SODIUM CHLORIDE 0.9 % IJ SOLN: 3.0000 mL | INTRAMUSCULAR | Status: DC | PRN

## 2015-09-06 MED ORDER — SODIUM CHLORIDE 0.9 % IJ SOLN: 10.0000 mL | INTRAMUSCULAR | Status: DC | PRN

## 2015-09-06 MED ORDER — SODIUM CHLORIDE 0.9 % IV SOLN
8.0000 mg | Freq: Once | INTRAVENOUS | Status: AC
  Administered 2015-09-06: 8 mg via INTRAVENOUS
  Filled 2015-09-06: qty 4

## 2015-09-06 MED ORDER — SODIUM CHLORIDE 0.9 % IV SOLN
INTRAVENOUS | Status: DC
  Administered 2015-09-06: 10:00:00 via INTRAVENOUS

## 2015-09-06 MED ORDER — HEPARIN SOD (PORK) LOCK FLUSH 100 UNIT/ML IV SOLN: 250.0000 [IU] | Freq: Once | INTRAVENOUS | Status: DC | PRN

## 2015-09-06 NOTE — Progress Notes (Signed)
HEMATOLOGY-ONCOLOGY PROGRESS NOTE  SUBJECTIVE: Day 1 chemotherapy, patient is anxious about his lumbar puncture later today.   OBJECTIVE: PHYSICAL EXAMINATION: ECOG PERFORMANCE STATUS: 0 - Asymptomatic  Filed Vitals:   09/06/15 1314  BP: 103/60  Pulse: 58  Temp: 98.3 F (36.8 C)  Resp:    Filed Weights   09/05/15 1140  Weight: 180 lb 8.9 oz (81.9 kg)    GENERAL:alert, no distress and comfortable SKIN: skin color, texture, turgor are normal, no rashes or significant lesions EYES: normal, Conjunctiva are pink and non-injected, sclera clear OROPHARYNX:no exudate, no erythema and lips, buccal mucosa, and tongue normal  NECK: supple, thyroid normal size, non-tender, without nodularity LYMPH:  no palpable lymphadenopathy in the cervical, axillary or inguinal LUNGS: clear to auscultation and percussion with normal breathing effort HEART: regular rate & rhythm and no murmurs and no lower extremity edema ABDOMEN:abdomen soft, non-tender and normal bowel sounds Musculoskeletal:no cyanosis of digits and no clubbing  NEURO: alert & oriented x 3 with fluent speech, no focal motor/sensory deficits  LABORATORY DATA:  I have reviewed the data as listed CMP Latest Ref Rng 09/06/2015 09/05/2015 08/31/2015  Glucose 65 - 99 mg/dL 91 107(H) 80  BUN 6 - 20 mg/dL 15 12 8.4  Creatinine 0.61 - 1.24 mg/dL 0.73 0.89 0.8  Sodium 135 - 145 mmol/L 138 142 143  Potassium 3.5 - 5.1 mmol/L 4.0 3.7 4.1  Chloride 101 - 111 mmol/L 106 105 -  CO2 22 - 32 mmol/L 27 30 28   Calcium 8.9 - 10.3 mg/dL 8.5(L) 8.6(L) 9.1  Total Protein 6.5 - 8.1 g/dL 5.5(L) 5.8(L) 5.8(L)  Total Bilirubin 0.3 - 1.2 mg/dL 0.2(L) 0.3 <0.30  Alkaline Phos 38 - 126 U/L 50 63 79  AST 15 - 41 U/L 20 23 31   ALT 17 - 63 U/L 25 28 44    Lab Results  Component Value Date   WBC 6.7 09/06/2015   HGB 10.0* 09/06/2015   HCT 31.3* 09/06/2015   MCV 89.7 09/06/2015   PLT 239 09/06/2015   NEUTROABS 4.4 09/06/2015    ASSESSMENT AND  PLAN: 1. Burkitt's lymphoma: Today's cycle 3 day 1 EPOCH-R So far he appears to be tolerating chemotherapy fairly well denies any nausea or vomiting. LP with intrathecal methotrexate will be done later today Monitoring closely for toxicities Uric acid has come down to 4 LDH has come down to 117 2. Anemia due to chemotherapy Daily lab monitoring

## 2015-09-06 NOTE — Progress Notes (Unsigned)
Darden Dates, Dr. Irene Limbo nurse.  [place] called CSF gram stain report.  WBC noted, mostly mononuclear. No organisms seen. 4:15 pm 09-06-2015 SDD

## 2015-09-06 NOTE — Progress Notes (Signed)
Manual calculation of BSA and dosing for doxorubicin, etoposide, and vincristine completed.  Secondary verification by Reyne Dumas, RN

## 2015-09-06 NOTE — Progress Notes (Signed)
Patient had  The intrathecal methotrexate in IR , patient was instructed to lay flat till 5 pm. Chemotherapy infusing well via right portacath.NO pain or swelling in site, with very good blood return.

## 2015-09-07 DIAGNOSIS — D649 Anemia, unspecified: Secondary | ICD-10-CM

## 2015-09-07 LAB — COMPREHENSIVE METABOLIC PANEL
ALT: 22 U/L (ref 17–63)
AST: 21 U/L (ref 15–41)
Albumin: 3.3 g/dL — ABNORMAL LOW (ref 3.5–5.0)
Alkaline Phosphatase: 53 U/L (ref 38–126)
Anion gap: 6 (ref 5–15)
BILIRUBIN TOTAL: 0.4 mg/dL (ref 0.3–1.2)
BUN: 19 mg/dL (ref 6–20)
CALCIUM: 8.5 mg/dL — AB (ref 8.9–10.3)
CHLORIDE: 109 mmol/L (ref 101–111)
CO2: 26 mmol/L (ref 22–32)
CREATININE: 0.73 mg/dL (ref 0.61–1.24)
Glucose, Bld: 145 mg/dL — ABNORMAL HIGH (ref 65–99)
Potassium: 3.6 mmol/L (ref 3.5–5.1)
Sodium: 141 mmol/L (ref 135–145)
TOTAL PROTEIN: 5.4 g/dL — AB (ref 6.5–8.1)

## 2015-09-07 LAB — CBC WITH DIFFERENTIAL/PLATELET
BASOS ABS: 0 10*3/uL (ref 0.0–0.1)
Basophils Relative: 0 %
EOS PCT: 0 %
Eosinophils Absolute: 0 10*3/uL (ref 0.0–0.7)
HEMATOCRIT: 28.7 % — AB (ref 39.0–52.0)
Hemoglobin: 9.5 g/dL — ABNORMAL LOW (ref 13.0–17.0)
LYMPHS ABS: 0.7 10*3/uL (ref 0.7–4.0)
LYMPHS PCT: 6 %
MCH: 29.5 pg (ref 26.0–34.0)
MCHC: 33.1 g/dL (ref 30.0–36.0)
MCV: 89.1 fL (ref 78.0–100.0)
MONO ABS: 0.8 10*3/uL (ref 0.1–1.0)
Monocytes Relative: 6 %
NEUTROS PCT: 88 %
Neutro Abs: 11 10*3/uL — ABNORMAL HIGH (ref 1.7–7.7)
PLATELETS: 271 10*3/uL (ref 150–400)
RBC: 3.22 MIL/uL — AB (ref 4.22–5.81)
RDW: 14.8 % (ref 11.5–15.5)
WBC: 12.6 10*3/uL — ABNORMAL HIGH (ref 4.0–10.5)

## 2015-09-07 LAB — GLUCOSE, CAPILLARY: GLUCOSE-CAPILLARY: 86 mg/dL (ref 65–99)

## 2015-09-07 MED ORDER — VINCRISTINE SULFATE CHEMO INJECTION 1 MG/ML
Freq: Once | INTRAVENOUS | Status: AC
  Administered 2015-09-07: 10:00:00 via INTRAVENOUS
  Filled 2015-09-07: qty 11

## 2015-09-07 MED ORDER — ONDANSETRON HCL 40 MG/20ML IJ SOLN
8.0000 mg | Freq: Once | INTRAMUSCULAR | Status: AC
  Administered 2015-09-07: 8 mg via INTRAVENOUS
  Filled 2015-09-07: qty 4

## 2015-09-07 NOTE — Progress Notes (Signed)
Manual calculation of BSA and dosing for doxorubicin, etoposide, and vincristine completed. Secondary verification by Lottie Dawson, RN

## 2015-09-07 NOTE — Progress Notes (Signed)
HEMATOLOGY-ONCOLOGY PROGRESS NOTE  SUBJECTIVE: Day 2 EPOCH-R; Denies nausea. Tolerated IT MTX very well.  OBJECTIVE: PHYSICAL EXAMINATION: ECOG PERFORMANCE STATUS: 1 - Symptomatic but completely ambulatory  Filed Vitals:   09/07/15 0627  BP: 114/46  Pulse: 51  Temp: 98.1 F (36.7 C)  Resp:    Filed Weights   09/05/15 1140 09/07/15 0627  Weight: 180 lb 8.9 oz (81.9 kg) 175 lb 14.8 oz (79.8 kg)    GENERAL:alert, no distress and comfortable SKIN: skin color, texture, turgor are normal, no rashes or significant lesions EYES: normal, Conjunctiva are pink and non-injected, sclera clear OROPHARYNX:no exudate, no erythema and lips, buccal mucosa, and tongue normal  NECK: supple, thyroid normal size, non-tender, without nodularity LYMPH:  no palpable lymphadenopathy in the cervical, axillary or inguinal LUNGS: clear to auscultation and percussion with normal breathing effort HEART: regular rate & rhythm and no murmurs and no lower extremity edema ABDOMEN:abdomen soft, non-tender and normal bowel sounds Musculoskeletal:no cyanosis of digits and no clubbing  NEURO: alert & oriented x 3 with fluent speech, no focal motor/sensory deficits  LABORATORY DATA:  I have reviewed the data as listed CMP Latest Ref Rng 09/07/2015 09/06/2015 09/05/2015  Glucose 65 - 99 mg/dL 145(H) 91 107(H)  BUN 6 - 20 mg/dL 19 15 12   Creatinine 0.61 - 1.24 mg/dL 0.73 0.73 0.89  Sodium 135 - 145 mmol/L 141 138 142  Potassium 3.5 - 5.1 mmol/L 3.6 4.0 3.7  Chloride 101 - 111 mmol/L 109 106 105  CO2 22 - 32 mmol/L 26 27 30   Calcium 8.9 - 10.3 mg/dL 8.5(L) 8.5(L) 8.6(L)  Total Protein 6.5 - 8.1 g/dL 5.4(L) 5.5(L) 5.8(L)  Total Bilirubin 0.3 - 1.2 mg/dL 0.4 0.2(L) 0.3  Alkaline Phos 38 - 126 U/L 53 50 63  AST 15 - 41 U/L 21 20 23   ALT 17 - 63 U/L 22 25 28     Lab Results  Component Value Date   WBC 12.6* 09/07/2015   HGB 9.5* 09/07/2015   HCT 28.7* 09/07/2015   MCV 89.1 09/07/2015   PLT 271 09/07/2015   NEUTROABS 11.0* 09/07/2015    ASSESSMENT AND PLAN: 1. Burkitt's lymphoma: Today's cycle 3 day 2 EPOCH-R:  Tolerating chemo well Denies any nausea  2. Anemia: Being monitored 3. IT MTX went well yday (although they had a slight difficulty getting the needle in) Monitoring for chemo toxicities

## 2015-09-08 DIAGNOSIS — K5900 Constipation, unspecified: Secondary | ICD-10-CM

## 2015-09-08 DIAGNOSIS — Z5111 Encounter for antineoplastic chemotherapy: Principal | ICD-10-CM

## 2015-09-08 DIAGNOSIS — R110 Nausea: Secondary | ICD-10-CM

## 2015-09-08 DIAGNOSIS — C8378 Burkitt lymphoma, lymph nodes of multiple sites: Secondary | ICD-10-CM

## 2015-09-08 LAB — COMPREHENSIVE METABOLIC PANEL
ALT: 26 U/L (ref 17–63)
AST: 23 U/L (ref 15–41)
Albumin: 3.4 g/dL — ABNORMAL LOW (ref 3.5–5.0)
Alkaline Phosphatase: 46 U/L (ref 38–126)
Anion gap: 4 — ABNORMAL LOW (ref 5–15)
BUN: 17 mg/dL (ref 6–20)
CHLORIDE: 112 mmol/L — AB (ref 101–111)
CO2: 27 mmol/L (ref 22–32)
CREATININE: 0.79 mg/dL (ref 0.61–1.24)
Calcium: 8.4 mg/dL — ABNORMAL LOW (ref 8.9–10.3)
Glucose, Bld: 94 mg/dL (ref 65–99)
POTASSIUM: 3.6 mmol/L (ref 3.5–5.1)
SODIUM: 143 mmol/L (ref 135–145)
Total Bilirubin: 0.5 mg/dL (ref 0.3–1.2)
Total Protein: 5.5 g/dL — ABNORMAL LOW (ref 6.5–8.1)

## 2015-09-08 LAB — CBC WITH DIFFERENTIAL/PLATELET
BASOS ABS: 0 10*3/uL (ref 0.0–0.1)
Basophils Relative: 0 %
EOS ABS: 0 10*3/uL (ref 0.0–0.7)
EOS PCT: 0 %
HCT: 28.9 % — ABNORMAL LOW (ref 39.0–52.0)
Hemoglobin: 9.7 g/dL — ABNORMAL LOW (ref 13.0–17.0)
Lymphocytes Relative: 14 %
Lymphs Abs: 1.5 10*3/uL (ref 0.7–4.0)
MCH: 29.6 pg (ref 26.0–34.0)
MCHC: 33.6 g/dL (ref 30.0–36.0)
MCV: 88.1 fL (ref 78.0–100.0)
MONO ABS: 0.9 10*3/uL (ref 0.1–1.0)
Monocytes Relative: 8 %
Neutro Abs: 8.6 10*3/uL — ABNORMAL HIGH (ref 1.7–7.7)
Neutrophils Relative %: 78 %
PLATELETS: 257 10*3/uL (ref 150–400)
RBC: 3.28 MIL/uL — AB (ref 4.22–5.81)
RDW: 15.1 % (ref 11.5–15.5)
WBC: 11 10*3/uL — AB (ref 4.0–10.5)

## 2015-09-08 LAB — GLUCOSE, CAPILLARY: GLUCOSE-CAPILLARY: 85 mg/dL (ref 65–99)

## 2015-09-08 MED ORDER — SODIUM CHLORIDE 0.9 % IV SOLN
8.0000 mg | Freq: Once | INTRAVENOUS | Status: AC
  Administered 2015-09-08: 8 mg via INTRAVENOUS
  Filled 2015-09-08: qty 4

## 2015-09-08 MED ORDER — VINCRISTINE SULFATE CHEMO INJECTION 1 MG/ML
Freq: Once | INTRAVENOUS | Status: AC
  Administered 2015-09-08: 12:00:00 via INTRAVENOUS
  Filled 2015-09-08: qty 11

## 2015-09-08 NOTE — Progress Notes (Signed)
Chemotherapy dosage and calculations checked and reviewed with Reyne Dumas, RN.

## 2015-09-08 NOTE — Progress Notes (Signed)
HEMATOLOGY-ONCOLOGY PROGRESS NOTE  SUBJECTIVE: Complains of nausea since yesterday but hasn't asked for nausea meds.  OBJECTIVE: PHYSICAL EXAMINATION: ECOG PERFORMANCE STATUS: 1 - Symptomatic but completely ambulatory  Filed Vitals:   09/08/15 0523  BP: 115/56  Pulse: 58  Temp: 97.8 F (36.6 C)  Resp: 14   Filed Weights   09/05/15 1140 09/07/15 0627 09/08/15 0523  Weight: 180 lb 8.9 oz (81.9 kg) 175 lb 14.8 oz (79.8 kg) 176 lb 1.6 oz (79.878 kg)    GENERAL:alert, no distress and comfortable SKIN: skin color, texture, turgor are normal, no rashes or significant lesions OROPHARYNX:no exudate, no erythema and lips, buccal mucosa, and tongue normal  NECK: supple, thyroid normal size, non-tender, without nodularity LUNGS: clear to auscultation and percussion with normal breathing effort HEART: regular rate & rhythm and no murmurs and no lower extremity edema ABDOMEN:abdomen soft, non-tender and normal bowel sounds NEURO: alert & oriented x 3 with fluent speech, no focal motor/sensory deficits  LABORATORY DATA:  I have reviewed the data as listed CMP Latest Ref Rng 09/08/2015 09/07/2015 09/06/2015  Glucose 65 - 99 mg/dL 94 145(H) 91  BUN 6 - 20 mg/dL 17 19 15   Creatinine 0.61 - 1.24 mg/dL 0.79 0.73 0.73  Sodium 135 - 145 mmol/L 143 141 138  Potassium 3.5 - 5.1 mmol/L 3.6 3.6 4.0  Chloride 101 - 111 mmol/L 112(H) 109 106  CO2 22 - 32 mmol/L 27 26 27   Calcium 8.9 - 10.3 mg/dL 8.4(L) 8.5(L) 8.5(L)  Total Protein 6.5 - 8.1 g/dL 5.5(L) 5.4(L) 5.5(L)  Total Bilirubin 0.3 - 1.2 mg/dL 0.5 0.4 0.2(L)  Alkaline Phos 38 - 126 U/L 46 53 50  AST 15 - 41 U/L 23 21 20   ALT 17 - 63 U/L 26 22 25     Lab Results  Component Value Date   WBC 11.0* 09/08/2015   HGB 9.7* 09/08/2015   HCT 28.9* 09/08/2015   MCV 88.1 09/08/2015   PLT 257 09/08/2015   NEUTROABS 8.6* 09/08/2015    ASSESSMENT AND PLAN: 1. Burkitt's lymphoma: Today's cycle 3 day 3 EPOCH-R:  Tolerating chemo well 2. Nausea:  Instructed the patient to request for nausea meds. He has not taken it because he is worried about constipation.  3. Constipation: He will get Miralax today 4. Anemia: Being monitored  Monitoring for toxicities.

## 2015-09-09 LAB — COMPREHENSIVE METABOLIC PANEL
ALBUMIN: 2.3 g/dL — AB (ref 3.5–5.0)
ALBUMIN: 3.4 g/dL — AB (ref 3.5–5.0)
ALK PHOS: 41 U/L (ref 38–126)
ALT: 22 U/L (ref 17–63)
ALT: 32 U/L (ref 17–63)
ANION GAP: 3 — AB (ref 5–15)
AST: 15 U/L (ref 15–41)
AST: 21 U/L (ref 15–41)
Alkaline Phosphatase: 29 U/L — ABNORMAL LOW (ref 38–126)
Anion gap: 6 (ref 5–15)
BUN: 15 mg/dL (ref 6–20)
BUN: 19 mg/dL (ref 6–20)
CALCIUM: 8.6 mg/dL — AB (ref 8.9–10.3)
CHLORIDE: 121 mmol/L — AB (ref 101–111)
CHLORIDE: 105 mmol/L (ref 101–111)
CO2: 19 mmol/L — AB (ref 22–32)
CO2: 27 mmol/L (ref 22–32)
CREATININE: 0.39 mg/dL — AB (ref 0.61–1.24)
CREATININE: 0.66 mg/dL (ref 0.61–1.24)
Calcium: 6 mg/dL — CL (ref 8.9–10.3)
GFR calc Af Amer: >60 mL/min (ref >60)
GFR calc non Af Amer: >60 mL/min (ref >60)
GFR calc non Af Amer: >60 mL/min (ref >60)
GLUCOSE: 87 mg/dL (ref 65–99)
Glucose, Bld: 69 mg/dL (ref 65–99)
POTASSIUM: 2.3 mmol/L — AB (ref 3.5–5.1)
Potassium: 3.2 mmol/L — ABNORMAL LOW (ref 3.5–5.1)
SODIUM: 143 mmol/L (ref 135–145)
SODIUM: 138 mmol/L (ref 135–145)
Total Bilirubin: 0.4 mg/dL (ref 0.3–1.2)
Total Bilirubin: 0.5 mg/dL (ref 0.3–1.2)
Total Protein: 3.6 g/dL — ABNORMAL LOW (ref 6.5–8.1)
Total Protein: 5.5 g/dL — ABNORMAL LOW (ref 6.5–8.1)

## 2015-09-09 LAB — CBC WITH DIFFERENTIAL/PLATELET
BASOS PCT: 0 %
Basophils Absolute: 0 10*3/uL (ref 0.0–0.1)
Basophils Absolute: 0 10*3/uL (ref 0.0–0.1)
Basophils Relative: 0 %
EOS ABS: 0 10*3/uL (ref 0.0–0.7)
EOS ABS: 0 10*3/uL (ref 0.0–0.7)
Eosinophils Relative: 0 %
Eosinophils Relative: 0 %
HCT: 21.1 % — ABNORMAL LOW (ref 39.0–52.0)
HEMATOCRIT: 29.4 % — AB (ref 39.0–52.0)
HEMOGLOBIN: 7.4 g/dL — AB (ref 13.0–17.0)
HEMOGLOBIN: 9.8 g/dL — AB (ref 13.0–17.0)
LYMPHS ABS: 1.3 10*3/uL (ref 0.7–4.0)
Lymphocytes Relative: 10 %
Lymphocytes Relative: 15 %
Lymphs Abs: 0.6 10*3/uL — ABNORMAL LOW (ref 0.7–4.0)
MCH: 30.3 pg (ref 26.0–34.0)
MCH: 28.9 pg (ref 26.0–34.0)
MCHC: 35.1 g/dL (ref 30.0–36.0)
MCHC: 33.3 g/dL (ref 30.0–36.0)
MCV: 86.5 fL (ref 78.0–100.0)
MCV: 86.7 fL (ref 78.0–100.0)
MONO ABS: 0.3 10*3/uL (ref 0.1–1.0)
MONO ABS: 0.4 10*3/uL (ref 0.1–1.0)
MONOS PCT: 4 %
MONOS PCT: 4 %
NEUTROS PCT: 86 %
NEUTROS PCT: 81 %
Neutro Abs: 5.6 10*3/uL (ref 1.7–7.7)
Neutro Abs: 7.3 10*3/uL (ref 1.7–7.7)
PLATELETS: 186 10*3/uL (ref 150–400)
Platelets: 264 10*3/uL (ref 150–400)
RBC: 2.44 MIL/uL — ABNORMAL LOW (ref 4.22–5.81)
RBC: 3.39 MIL/uL — ABNORMAL LOW (ref 4.22–5.81)
RDW: 14.7 % (ref 11.5–15.5)
RDW: 14.9 % (ref 11.5–15.5)
WBC: 6.5 10*3/uL (ref 4.0–10.5)
WBC: 9 10*3/uL (ref 4.0–10.5)

## 2015-09-09 LAB — GLUCOSE, CAPILLARY: GLUCOSE-CAPILLARY: 84 mg/dL (ref 65–99)

## 2015-09-09 MED ORDER — SODIUM CHLORIDE 0.9 % IV SOLN
8.0000 mg | Freq: Once | INTRAVENOUS | Status: AC
  Administered 2015-09-09: 8 mg via INTRAVENOUS
  Filled 2015-09-09: qty 4

## 2015-09-09 MED ORDER — VINCRISTINE SULFATE CHEMO INJECTION 1 MG/ML
Freq: Once | INTRAVENOUS | Status: AC
  Administered 2015-09-09: 16:00:00 via INTRAVENOUS
  Filled 2015-09-09: qty 11

## 2015-09-09 NOTE — Progress Notes (Signed)
HEMATOLOGY-ONCOLOGY PROGRESS NOTE  SUBJECTIVE:Nausea constantly, No Bowel movements, Feels tired.  OBJECTIVE: PHYSICAL EXAMINATION: ECOG PERFORMANCE STATUS: 2 - Symptomatic, <50% confined to bed  Filed Vitals:   09/09/15 0550  BP: 110/63  Pulse: 63  Temp: 97.9 F (36.6 C)  Resp: 16   Filed Weights   09/07/15 0627 09/08/15 0523 09/09/15 0550  Weight: 175 lb 14.8 oz (79.8 kg) 176 lb 1.6 oz (79.878 kg) 175 lb 14.4 oz (79.788 kg)    GENERAL:alert, no distress and comfortable LUNGS: clear to auscultation and percussion with normal breathing effort HEART: regular rate & rhythm and no murmurs and no lower extremity edema ABDOMEN:abdomen soft, non-tender and normal bowel sounds Musculoskeletal:no cyanosis of digits and no clubbing  NEURO: alert & oriented x 3 with fluent speech, no focal motor/sensory deficits  LABORATORY DATA:  I have reviewed the data as listed CMP Latest Ref Rng 09/09/2015 09/08/2015 09/07/2015  Glucose 65 - 99 mg/dL 69 94 145(H)  BUN 6 - 20 mg/dL 15 17 19   Creatinine 0.61 - 1.24 mg/dL 0.39(L) 0.79 0.73  Sodium 135 - 145 mmol/L 143 143 141  Potassium 3.5 - 5.1 mmol/L 2.3(LL) 3.6 3.6  Chloride 101 - 111 mmol/L 121(H) 112(H) 109  CO2 22 - 32 mmol/L 19(L) 27 26  Calcium 8.9 - 10.3 mg/dL 6.0(LL) 8.4(L) 8.5(L)  Total Protein 6.5 - 8.1 g/dL 3.6(L) 5.5(L) 5.4(L)  Total Bilirubin 0.3 - 1.2 mg/dL 0.4 0.5 0.4  Alkaline Phos 38 - 126 U/L 29(L) 46 53  AST 15 - 41 U/L 15 23 21   ALT 17 - 63 U/L 22 26 22     Lab Results  Component Value Date   WBC 6.5 09/09/2015   HGB 7.4* 09/09/2015   HCT 21.1* 09/09/2015   MCV 86.5 09/09/2015   PLT 186 09/09/2015   NEUTROABS 5.6 09/09/2015    ASSESSMENT AND PLAN: 1. Burkitt's lymphoma: Today's cycle 3 day 4 EPOCH-R:  Tolerating chemo well (last day for Adriamycin, etoposide and Vincristine. Tomorrow he gets cytoxan and Rituxan. 2. Nausea: rel to chemo. Not been eating much. Advised on taking nausea meds 3. Constipation:  Inspite of Miralax. He is unable to drink it. Will prescribe dulcolax pills. 4. Anemia: Being monitored  I reviewed the labs and they appear to be in error. So we will re draw them all. Monitoring for toxicities. Dr.Kale will see him from tomorrow.

## 2015-09-09 NOTE — Progress Notes (Signed)
Chemotherapy dosage and calculations checked and reviewed with Diana Torres RN. 

## 2015-09-09 NOTE — Progress Notes (Signed)
Pt has drastic  critical potassium of 2.3 which look incorrect  Compared to yesterdays labs, and calcium of 6.0. Rn wanted to repeat lab but pt wants to sleep. Will get oredr from MD and repeat lab.

## 2015-09-10 ENCOUNTER — Inpatient Hospital Stay (HOSPITAL_COMMUNITY): Payer: BLUE CROSS/BLUE SHIELD

## 2015-09-10 DIAGNOSIS — K5909 Other constipation: Secondary | ICD-10-CM

## 2015-09-10 DIAGNOSIS — G970 Cerebrospinal fluid leak from spinal puncture: Secondary | ICD-10-CM

## 2015-09-10 DIAGNOSIS — R51 Headache: Secondary | ICD-10-CM

## 2015-09-10 LAB — CBC WITH DIFFERENTIAL/PLATELET
BASOS PCT: 0 %
Basophils Absolute: 0 10*3/uL (ref 0.0–0.1)
EOS ABS: 0 10*3/uL (ref 0.0–0.7)
EOS PCT: 0 %
HCT: 30.1 % — ABNORMAL LOW (ref 39.0–52.0)
HEMOGLOBIN: 10.1 g/dL — AB (ref 13.0–17.0)
Lymphocytes Relative: 33 %
Lymphs Abs: 1.6 10*3/uL (ref 0.7–4.0)
MCH: 28.9 pg (ref 26.0–34.0)
MCHC: 33.6 g/dL (ref 30.0–36.0)
MCV: 86 fL (ref 78.0–100.0)
Monocytes Absolute: 0 10*3/uL — ABNORMAL LOW (ref 0.1–1.0)
Monocytes Relative: 1 %
NEUTROS PCT: 66 %
Neutro Abs: 3 10*3/uL (ref 1.7–7.7)
PLATELETS: 242 10*3/uL (ref 150–400)
RBC: 3.5 MIL/uL — AB (ref 4.22–5.81)
RDW: 14.6 % (ref 11.5–15.5)
WBC: 4.7 10*3/uL (ref 4.0–10.5)

## 2015-09-10 LAB — COMPREHENSIVE METABOLIC PANEL
ALBUMIN: 3.4 g/dL — AB (ref 3.5–5.0)
ALK PHOS: 41 U/L (ref 38–126)
ALT: 43 U/L (ref 17–63)
AST: 30 U/L (ref 15–41)
Anion gap: 5 (ref 5–15)
BUN: 17 mg/dL (ref 6–20)
CALCIUM: 8.7 mg/dL — AB (ref 8.9–10.3)
CHLORIDE: 106 mmol/L (ref 101–111)
CO2: 29 mmol/L (ref 22–32)
CREATININE: 0.7 mg/dL (ref 0.61–1.24)
GFR calc non Af Amer: >60 mL/min (ref >60)
GLUCOSE: 87 mg/dL (ref 65–99)
Potassium: 3.5 mmol/L (ref 3.5–5.1)
SODIUM: 140 mmol/L (ref 135–145)
Total Bilirubin: 0.9 mg/dL (ref 0.3–1.2)
Total Protein: 5.5 g/dL — ABNORMAL LOW (ref 6.5–8.1)

## 2015-09-10 LAB — LACTATE DEHYDROGENASE: LDH: 155 U/L (ref 98–192)

## 2015-09-10 MED ORDER — PANTOPRAZOLE SODIUM 40 MG IV SOLR
40.0000 mg | Freq: Two times a day (BID) | INTRAVENOUS | Status: DC
  Administered 2015-09-10 – 2015-09-12 (×4): 40 mg via INTRAVENOUS
  Filled 2015-09-10 (×3): qty 40

## 2015-09-10 MED ORDER — SODIUM CHLORIDE 0.9 % IV SOLN: 150.0000 mg | Freq: Once | INTRAVENOUS | Status: DC

## 2015-09-10 MED ORDER — PROMETHAZINE HCL 25 MG/ML IJ SOLN
12.5000 mg | Freq: Three times a day (TID) | INTRAMUSCULAR | Status: AC | PRN
  Administered 2015-09-10: 25 mg via INTRAVENOUS
  Filled 2015-09-10: qty 1

## 2015-09-10 MED ORDER — SODIUM CHLORIDE 0.9 % IV SOLN
Freq: Once | INTRAVENOUS | Status: AC
  Administered 2015-09-10: 11:00:00 via INTRAVENOUS
  Filled 2015-09-10: qty 5

## 2015-09-10 MED ORDER — DEXAMETHASONE SODIUM PHOSPHATE 10 MG/ML IJ SOLN
8.0000 mg | Freq: Three times a day (TID) | INTRAMUSCULAR | Status: DC
  Administered 2015-09-10 – 2015-09-12 (×6): 8 mg via INTRAVENOUS
  Filled 2015-09-10 (×6): qty 1

## 2015-09-10 MED ORDER — HYDROMORPHONE HCL 1 MG/ML IJ SOLN
0.5000 mg | INTRAMUSCULAR | Status: DC | PRN
  Administered 2015-09-10: 1 mg via INTRAVENOUS
  Administered 2015-09-11: 0.5 mg via INTRAVENOUS
  Administered 2015-09-11: 1 mg via INTRAVENOUS
  Filled 2015-09-10 (×3): qty 1

## 2015-09-10 MED ORDER — SENNOSIDES-DOCUSATE SODIUM 8.6-50 MG PO TABS
2.0000 | ORAL_TABLET | Freq: Two times a day (BID) | ORAL | Status: DC
  Administered 2015-09-10 – 2015-09-12 (×3): 2 via ORAL
  Filled 2015-09-10 (×3): qty 2

## 2015-09-10 MED ORDER — LORAZEPAM 2 MG/ML IJ SOLN: 0.5000 mg | INTRAMUSCULAR | Status: DC | PRN

## 2015-09-10 MED ORDER — SODIUM CHLORIDE 0.9 % IV SOLN
INTRAVENOUS | Status: AC
Start: 2015-09-10 — End: 2015-09-10
  Administered 2015-09-10: 15:00:00 via INTRAVENOUS

## 2015-09-10 NOTE — Progress Notes (Signed)
Spoke with patients nurse who relayed the information that patient was unable to take PO Zofran and he was having nausea and vomiting.  I spoke with the patient and he stated that he received Zofran IV yesterday with some relief.  4mg  IV Zofran given with NS flush before and after, chemo infused paused for administration.  Patient also stated that he has a headache that started on Friday.  Currently there are no orders for IV pain meds.  Spoke with Dr. Grier Mitts nurse Mickel Baas and relayed information regarding headache, nausea and vomiting since Friday, intrathecal chemo given on Thursday.  She spoke with Dr. Irene Limbo and Emend and Decadron infusion was ordered.  Consulted with pharmacy and these meds are not compatible with his chemo infusion so it was held for the 30 minute infusion of Emend/Decadron.

## 2015-09-11 ENCOUNTER — Inpatient Hospital Stay (HOSPITAL_COMMUNITY): Payer: BLUE CROSS/BLUE SHIELD

## 2015-09-11 ENCOUNTER — Telehealth: Payer: Self-pay | Admitting: Hematology

## 2015-09-11 ENCOUNTER — Other Ambulatory Visit: Payer: Self-pay | Admitting: *Deleted

## 2015-09-11 ENCOUNTER — Ambulatory Visit: Payer: BLUE CROSS/BLUE SHIELD

## 2015-09-11 DIAGNOSIS — R945 Abnormal results of liver function studies: Secondary | ICD-10-CM

## 2015-09-11 LAB — GLUCOSE, CAPILLARY: Glucose-Capillary: 149 mg/dL — ABNORMAL HIGH (ref 65–99)

## 2015-09-11 LAB — CBC WITH DIFFERENTIAL/PLATELET
BASOS ABS: 0 10*3/uL (ref 0.0–0.1)
BASOS PCT: 0 %
EOS PCT: 0 %
Eosinophils Absolute: 0 10*3/uL (ref 0.0–0.7)
HCT: 31 % — ABNORMAL LOW (ref 39.0–52.0)
Hemoglobin: 11 g/dL — ABNORMAL LOW (ref 13.0–17.0)
LYMPHS PCT: 7 %
Lymphs Abs: 0.2 10*3/uL — ABNORMAL LOW (ref 0.7–4.0)
MCH: 29.7 pg (ref 26.0–34.0)
MCHC: 35.5 g/dL (ref 30.0–36.0)
MCV: 83.8 fL (ref 78.0–100.0)
MONO ABS: 0 10*3/uL — AB (ref 0.1–1.0)
Monocytes Relative: 0 %
Neutro Abs: 2.8 10*3/uL (ref 1.7–7.7)
Neutrophils Relative %: 93 %
PLATELETS: 323 10*3/uL (ref 150–400)
RBC: 3.7 MIL/uL — ABNORMAL LOW (ref 4.22–5.81)
RDW: 14.1 % (ref 11.5–15.5)
WBC: 3 10*3/uL — ABNORMAL LOW (ref 4.0–10.5)

## 2015-09-11 LAB — COMPREHENSIVE METABOLIC PANEL
ALT: 64 U/L — ABNORMAL HIGH (ref 17–63)
AST: 42 U/L — ABNORMAL HIGH (ref 15–41)
Albumin: 3.9 g/dL (ref 3.5–5.0)
Alkaline Phosphatase: 45 U/L (ref 38–126)
Anion gap: 10 (ref 5–15)
BILIRUBIN TOTAL: 2.4 mg/dL — AB (ref 0.3–1.2)
BUN: 22 mg/dL — ABNORMAL HIGH (ref 6–20)
CALCIUM: 9.4 mg/dL (ref 8.9–10.3)
CO2: 26 mmol/L (ref 22–32)
Chloride: 101 mmol/L (ref 101–111)
Creatinine, Ser: 0.64 mg/dL (ref 0.61–1.24)
GFR calc Af Amer: >60 mL/min (ref >60)
GFR calc non Af Amer: >60 mL/min (ref >60)
GLUCOSE: 136 mg/dL — AB (ref 65–99)
Potassium: 3.9 mmol/L (ref 3.5–5.1)
Sodium: 137 mmol/L (ref 135–145)
TOTAL PROTEIN: 6.1 g/dL — AB (ref 6.5–8.1)

## 2015-09-11 MED ORDER — MAGNESIUM CITRATE PO SOLN: 1.0000 | Freq: Once | ORAL | Status: DC

## 2015-09-11 MED ORDER — HEPARIN SOD (PORK) LOCK FLUSH 100 UNIT/ML IV SOLN
500.0000 [IU] | Freq: Once | INTRAVENOUS | Status: DC | PRN
  Filled 2015-09-11: qty 5

## 2015-09-11 MED ORDER — SODIUM CHLORIDE 0.9 % IV SOLN: Freq: Once | INTRAVENOUS | Status: DC | PRN

## 2015-09-11 MED ORDER — IOHEXOL 180 MG/ML  SOLN: 2.0000 mL | Freq: Once | INTRAMUSCULAR | Status: DC | PRN

## 2015-09-11 MED ORDER — DIPHENHYDRAMINE HCL 50 MG/ML IJ SOLN
25.0000 mg | Freq: Once | INTRAMUSCULAR | Status: DC | PRN
Start: 2015-09-11 — End: 2015-09-12

## 2015-09-11 MED ORDER — SODIUM CHLORIDE 0.9 % IV SOLN
INTRAVENOUS | Status: AC
  Administered 2015-09-11: 11:00:00 via INTRAVENOUS

## 2015-09-11 MED ORDER — EPINEPHRINE HCL 0.1 MG/ML IJ SOSY: 0.2500 mg | PREFILLED_SYRINGE | Freq: Once | INTRAMUSCULAR | Status: DC | PRN

## 2015-09-11 MED ORDER — DIPHENHYDRAMINE HCL 50 MG/ML IJ SOLN
50.0000 mg | Freq: Once | INTRAMUSCULAR | Status: DC | PRN
Start: 2015-09-11 — End: 2015-09-12

## 2015-09-11 MED ORDER — SODIUM CHLORIDE 0.9 % IV SOLN
Freq: Once | INTRAVENOUS | Status: AC
  Administered 2015-09-11: 13:00:00 via INTRAVENOUS

## 2015-09-11 MED ORDER — METHYLPREDNISOLONE SODIUM SUCC 125 MG IJ SOLR: 125.0000 mg | Freq: Once | INTRAMUSCULAR | Status: DC | PRN

## 2015-09-11 MED ORDER — ALTEPLASE 2 MG IJ SOLR: 2.0000 mg | Freq: Once | INTRAMUSCULAR | Status: DC | PRN

## 2015-09-11 MED ORDER — EPINEPHRINE HCL 1 MG/ML IJ SOLN
0.5000 mg | Freq: Once | INTRAMUSCULAR | Status: DC | PRN
Start: 2015-09-11 — End: 2015-09-12

## 2015-09-11 MED ORDER — ALBUTEROL SULFATE (2.5 MG/3ML) 0.083% IN NEBU: 2.5000 mg | INHALATION_SOLUTION | Freq: Once | RESPIRATORY_TRACT | Status: DC | PRN

## 2015-09-11 MED ORDER — LIDOCAINE HCL (PF) 1 % IJ SOLN
INTRAMUSCULAR | Status: AC
  Administered 2015-09-11: 11:00:00
  Filled 2015-09-11: qty 30

## 2015-09-11 MED ORDER — HEPARIN SOD (PORK) LOCK FLUSH 100 UNIT/ML IV SOLN: 250.0000 [IU] | Freq: Once | INTRAVENOUS | Status: DC | PRN

## 2015-09-11 MED ORDER — FAMOTIDINE IN NACL 20-0.9 MG/50ML-% IV SOLN
20.0000 mg | Freq: Once | INTRAVENOUS | Status: DC | PRN
  Filled 2015-09-11: qty 50

## 2015-09-11 MED ORDER — SODIUM CHLORIDE 0.9 % IV SOLN
750.0000 mg/m2 | Freq: Once | INTRAVENOUS | Status: AC
  Administered 2015-09-11: 1600 mg via INTRAVENOUS
  Filled 2015-09-11: qty 80

## 2015-09-11 MED ORDER — SODIUM CHLORIDE 0.9 % IJ SOLN: 10.0000 mL | INTRAMUSCULAR | Status: DC | PRN

## 2015-09-11 MED ORDER — RITUXIMAB CHEMO INJECTION 500 MG/50ML
375.0000 mg/m2 | Freq: Once | INTRAVENOUS | Status: AC
  Administered 2015-09-11: 800 mg via INTRAVENOUS
  Filled 2015-09-11: qty 50

## 2015-09-11 MED ORDER — SODIUM CHLORIDE 0.9 % IJ SOLN: 3.0000 mL | INTRAMUSCULAR | Status: DC | PRN

## 2015-09-11 MED ORDER — SODIUM CHLORIDE 0.9 % IV SOLN
Freq: Once | INTRAVENOUS | Status: AC
  Administered 2015-09-11: 13:00:00 via INTRAVENOUS
  Filled 2015-09-11: qty 4

## 2015-09-11 MED ORDER — ACETAMINOPHEN 325 MG PO TABS
650.0000 mg | ORAL_TABLET | Freq: Once | ORAL | Status: AC
  Administered 2015-09-11: 650 mg via ORAL
  Filled 2015-09-11: qty 2

## 2015-09-11 MED ORDER — DIPHENHYDRAMINE HCL 50 MG PO CAPS
50.0000 mg | ORAL_CAPSULE | Freq: Once | ORAL | Status: AC
  Administered 2015-09-11: 50 mg via ORAL
  Filled 2015-09-11: qty 1

## 2015-09-11 NOTE — Telephone Encounter (Signed)
Spoke with patient re appointment for 11/3. Other appointments remain the same.

## 2015-09-11 NOTE — Progress Notes (Signed)
BSA, DOSAGES and DILUTIONS for Cylcophosphamide/Rituxan verified with 2nd RN Aldean Baker.

## 2015-09-11 NOTE — Procedures (Signed)
Lumbar epidurogram and bloodpatch R L5-S1 under fluoro No complication No blood loss. See complete dictation in [facility].

## 2015-09-11 NOTE — Care Management Note (Signed)
Case Management Note  Patient Details  Name: Donald [name] MRN: [id number] Date of Birth: 11/24/1975  Subjective/Objective:       39 yo admitted with Lymphoma for Chemotherapy             Action/Plan: From home with wife, and plans to return there.  Expected Discharge Date:                  Expected Discharge Plan:  Home/Self Care  In-House Referral:     Discharge planning Services  CM Consult  Post Acute Care Choice:    Choice offered to:     DME Arranged:    DME Agency:     HH Arranged:    HH Agency:     Status of Service:  In process, will continue to follow  Medicare Important Message Given:    Date Medicare IM Given:    Medicare IM give by:    Date Additional Medicare IM Given:    Additional Medicare Important Message give by:     If discussed at [place] of Stay Meetings, dates discussed:    Additional Comments:  Lynnell Catalan, RN 09/11/2015, 3:13 PM

## 2015-09-11 NOTE — Progress Notes (Signed)
HEMATOLOGY/ONCOLOGY INPATIENT PROGRESS NOTE  Date of Service: .09/11/2015  Inpatient Attending: .Brunetta Genera, MD   SUBJECTIVE  Patient was seen this morning. Notes that his nausea is much improved and he was able to read some breakfast this morning. Did have a small bowel movement this morning. Still with persistent orthostatic headaches with sitting up much better with lying down. Discuss with patient that this could be from a CSF leak and discussed the option of a possible epidural blood patch. I called interventional radiology and discussed with Dr. Vernard Gambles who notes that that he will come and talk to the patient about this procedure. Patient is okay with proceeding with day 5 Cytoxan and rituximab today. Continue other supportive cares. No other acute new symptoms.  OBJECTIVE:  PHYSICAL EXAMINATION: ..BP 110/61 mmHg  Pulse 65  Temp(Src) 98.2 F (36.8 C) (Oral)  Resp 16  Ht 5\' 11"  (1.803 m)  Wt 168 lb (76.204 kg)  BMI 23.44 kg/m2  SpO2 98% .Body mass index is 23.44 kg/(m^2).  GENERAL:alert, in no acute distress and comfortable SKIN: skin color, texture, turgor are normal, no rashes or significant lesions EYES: normal, conjunctiva are pink and non-injected, sclera clear OROPHARYNX:no exudate, no erythema and lips, buccal mucosa, and tongue normal  NECK: supple, no JVD, thyroid normal size, non-tender, without nodularity LYMPH:  no palpable lymphadenopathy in the cervical, axillary or inguinal LUNGS: clear to auscultation with normal respiratory effort HEART: regular rate & rhythm,  no murmurs and no lower extremity edema ABDOMEN: abdomen soft, non-tender, normoactive bowel sounds  Musculoskeletal: no cyanosis of digits and no clubbing  PSYCH: alert & oriented x 3 with fluent speech NEURO: no focal motor/sensory deficits  MEDICAL HISTORY:  Past Medical History  Diagnosis Date  . EBV infection 2013    Patient notes that he had an EBV infection in 2013 that left  him with chronic fatigue. She also notes that he had significant lymphadenopathy and thyroiditis at the time. He has been managing his symptoms with an alternative medicine practitioner in [place] and with other alternative medicines.  . Marijuana use, episodic   . Complication of anesthesia   . Burkitt lymphoma ([place]) 08/15/2015    SURGICAL HISTORY: Past Surgical History  Procedure Laterality Date  . Tonsillectomy    . Appendectomy    . Laparoscopy N/A 07/20/2015    Procedure: LAPAROSCOPY DIAGNOSTIC, MULTIPLE BIOPSIES, DRAINAGE OF ASCITES;  Surgeon: Fanny Skates, MD;  Location: [facility];  Service: General;  Laterality: N/A;    SOCIAL HISTORY: Social History   Social History  . Marital Status: Married    Spouse Name: N/A  . Number of Children: N/A  . Years of Education: N/A   Occupational History  . Not on file.   Social History Main Topics  . Smoking status: Never Smoker   . Smokeless tobacco: Never Used  . Alcohol Use: No  . Drug Use: 1.00 per week    Special: Marijuana  . Sexual Activity: Yes   Other Topics Concern  . 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   He has a fiance and has been in a steady relationship.      He has tried to maintain a very healthy lifestyle and cycles about 100 miles a week. He also uses a fair number of over-the-counter alternative medicines to stay healthy.    FAMILY HISTORY: Family History  Problem  Relation Age of Onset  . Heart failure Father     ALLERGIES:  is allergic to levaquin and sulfa antibiotics.  MEDICATIONS:  . . sodium chloride   Intravenous Once  . acetaminophen  650 mg Oral Once  . cyclophosphamide  750 mg/m2 (Treatment Plan Actual) Intravenous Once  . dexamethasone  8 mg Intravenous 3 times per day  . diphenhydrAMINE  50 mg Oral Once  . ondansetron (ZOFRAN) IVPB [facility] +/- dexamethasone   Intravenous Once  .  pantoprazole (PROTONIX) IV  40 mg Intravenous Q12H  . riTUXimab (RITUXAN) IV infusion  375 mg/m2 (Treatment Plan Actual) Intravenous Once  . senna-docusate  2 tablet Oral BID    .sodium chloride, acetaminophen, albuterol, alteplase, alteplase, Cold Pack, diphenhydrAMINE, diphenhydrAMINE, EPINEPHrine, EPINEPHrine, EPINEPHrine, EPINEPHrine, famotidine, heparin lock flush, heparin lock flush, heparin lock flush, heparin lock flush, Hot Pack, HYDROmorphone (DILAUDID) injection, LORazepam, methylPREDNISolone sodium succinate, ondansetron **OR** ondansetron **OR** ondansetron (ZOFRAN) IV **OR** ondansetron (ZOFRAN) IV, sodium chloride, sodium chloride, sodium chloride, sodium chloride  REVIEW OF SYSTEMS:    10 Point review of Systems was done is negative except as noted above.   LABORATORY DATA:  I have reviewed the data as listed  . CBC Latest Ref Rng 09/11/2015 09/10/2015 09/09/2015  WBC 4.0 - 10.5 K/uL 3.0(L) 4.7 9.0  Hemoglobin 13.0 - 17.0 g/dL 11.0(L) 10.1(L) 9.8(L)  Hematocrit 39.0 - 52.0 % 31.0(L) 30.1(L) 29.4(L)  Platelets 150 - 400 K/uL 323 242 264    . CMP Latest Ref Rng 09/11/2015 09/10/2015 09/09/2015  Glucose 65 - 99 mg/dL 136(H) 87 87  BUN 6 - 20 mg/dL 22(H) 17 19  Creatinine 0.61 - 1.24 mg/dL 0.64 0.70 0.66  Sodium 135 - 145 mmol/L 137 140 138  Potassium 3.5 - 5.1 mmol/L 3.9 3.5 3.2(L)  Chloride 101 - 111 mmol/L 101 106 105  CO2 22 - 32 mmol/L 26 29 27   Calcium 8.9 - 10.3 mg/dL 9.4 8.7(L) 8.6(L)  Total Protein 6.5 - 8.1 g/dL 6.1(L) 5.5(L) 5.5(L)  Total Bilirubin 0.3 - 1.2 mg/dL 2.4(H) 0.9 0.5  Alkaline Phos 38 - 126 U/L 45 41 41  AST 15 - 41 U/L 42(H) 30 21  ALT 17 - 63 U/L 64(H) 43 32     RADIOGRAPHIC STUDIES: I have personally reviewed the radiological images as listed and agreed with the findings in the report. 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  Cytogenetics showed Myc Break apart event associated with chromosomal variants of Burkitt's lymphoma t(2;8) and t(8;22). Patient has completed the cycle 1 and 2 of R -EPOCH as inpatient and tolerated it well. PET/CT scan after 2 cycles showed complete radiographic remission. LDH levels have normalized too. Patient is receiving his third cycle of R-EPOCH in the hospital at this time. Has completed 4 of 5 days. Received intrathecal methotrexate day 1 for CNS prophylaxis. D5 cyclophosphamide and Rituxan was held yesterday due to persistent nausea and headaches. D5 intrathecal methotrexate has been held for the cycle.  Plan -Will hold D5 intrathecal methotrexate this cycle - proceed with D5 cyclophosphamide and Rituxan today   #2 slight bump in LFTs including bilirubin. Likely from chemotherapy. We'll monitor closely.  #3 persistent headaches likely from mild CSF leak. Less likely from chemical arachnoiditis from his intrathecal methotrexate -Continue IV dexamethasone every 8 hours as ordered. -I discussed with Dr. Vernard Gambles from IR for possible epidural blood patch  tomorrow--- they will evaluate him for an epidural blood patch. -Will give another liter of IV normal saline.  #3 persistent nausea. 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 Much improved with IV dexamethasone. Received Emend and promethazine yesterday. Was also partly because he did not take his oral prednisone as ordered on the last few days. Plan Symptoms much improved . -We'll continue IV dexamethasone . -We'll premedicate with IV Zofran and dexamethasone for his cyclophosphamide today .  #4 Constipation -likely from vincristine and opiates. Patient passing gas. He had a small bowel movement this morning. -2-3 tab senna S 2 times a day -magnesium citrate once today I'll continue to follow daily .    I spent 35 minutes counseling the patient face to face. The total time spent in the  appointment was 40 minutes and more than 50% was on counseling and direct patient cares.    Sullivan Lone MD [place] AAHIVMS [facility] Hematology/Oncology Physician [facility]  (Office):       [phone] (Work cell):  [phone] (Fax):           [phone]

## 2015-09-11 NOTE — Progress Notes (Signed)
HEMATOLOGY/ONCOLOGY INPATIENT PROGRESS NOTE  Date of Service: 09/10/2015  Inpatient Attending: .Brunetta Genera, MD   SUBJECTIVE  Patient was seen around noon. Notes significant headaches which appear to be worse with sitting up. No focal neurological deficits. Also notes significant uncontrollable nausea and food aversion and has not eaten much in the last few days. Significant constipation as well. Does not want any additional chemotherapy at this time to low symptoms are better controlled which is reasonable. We adjusted a bunch of his meds for pain, nausea and constipation and plan to do his day 5 cycle cyclophosphamide plus Rituxan tomorrow if he is more comfortable. No fevers or chills. He was given IV dexamethasone, IV fluids, Phenergan and Dilaudid for symptom control. I visited him again in the evening and he was noted to be much more comfortable with good control of his headaches and nausea. We discussed completely bedrest with a plan to consider an epidural blood patch if his headaches were persistent.  OBJECTIVE:  PHYSICAL EXAMINATION: . Filed Vitals:   09/09/15 2132 09/10/15 1445 09/10/15 2328 09/11/15 0541  BP: 115/82 117/53 105/51 110/61  Pulse: 97 52 52 65  Temp:  98.5 F (36.9 C) 97.8 F (36.6 C) 98.2 F (36.8 C)  TempSrc: Oral Oral Oral Oral  Resp: 16 16 16 16   Height:      Weight:    168 lb (76.204 kg)  SpO2: 97% 99% 98% 98%   Filed Weights   09/08/15 0523 09/09/15 0550 09/11/15 0541  Weight: 176 lb 1.6 oz (79.878 kg) 175 lb 14.4 oz (79.788 kg) 168 lb (76.204 kg)   .Body mass index is 23.44 kg/(m^2).  GENERAL:alert, in no acute distress and comfortable SKIN: skin color, texture, turgor are normal, no rashes or significant lesions EYES: normal, conjunctiva are pink and non-injected, sclera clear OROPHARYNX:no exudate, no erythema and lips, buccal mucosa, and tongue normal  NECK: supple, no JVD, thyroid normal size, non-tender, without  nodularity LYMPH:  no palpable lymphadenopathy in the cervical, axillary or inguinal LUNGS: clear to auscultation with normal respiratory effort HEART: regular rate & rhythm,  no murmurs and no lower extremity edema ABDOMEN: abdomen soft, non-tender, normoactive bowel sounds  Musculoskeletal: no cyanosis of digits and no clubbing  PSYCH: alert & oriented x 3 with fluent speech NEURO: no focal motor/sensory deficits  MEDICAL HISTORY:  Past Medical History  Diagnosis Date  . EBV infection 2013    Patient notes that he had an EBV infection in 2013 that left him with chronic fatigue. She also notes that he had significant lymphadenopathy and thyroiditis at the time. He has been managing his symptoms with an alternative medicine practitioner in [place] and with other alternative medicines.  . Marijuana use, episodic   . Complication of anesthesia   . Burkitt lymphoma ([place]) 08/15/2015    SURGICAL HISTORY: Past Surgical History  Procedure Laterality Date  . Tonsillectomy    . Appendectomy    . Laparoscopy N/A 07/20/2015    Procedure: LAPAROSCOPY DIAGNOSTIC, MULTIPLE BIOPSIES, DRAINAGE OF ASCITES;  Surgeon: Fanny Skates, MD;  Location: [facility];  Service: General;  Laterality: N/A;    SOCIAL HISTORY: Social History   Social History  . Marital Status: Married    Spouse Name: N/A  . Number of Children: N/A  . Years of Education: N/A   Occupational History  . Not on file.   Social History Main Topics  . Smoking status: Never Smoker   . Smokeless tobacco: Never Used  .  Alcohol Use: No  . Drug Use: 1.00 per week    Special: Marijuana  . Sexual Activity: Yes   Other Topics Concern  . 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   He has a fiance and has been in a steady relationship.      He has tried to maintain a very healthy lifestyle and cycles about 100 miles a week. He also  uses a fair number of over-the-counter alternative medicines to stay healthy.    FAMILY HISTORY: Family History  Problem Relation Age of Onset  . Heart failure Father     ALLERGIES:  is allergic to levaquin and sulfa antibiotics.  MEDICATIONS:  Scheduled Meds: . sodium chloride   Intravenous Once  . acetaminophen  650 mg Oral Once  . cyclophosphamide  750 mg/m2 (Treatment Plan Actual) Intravenous Once  . dexamethasone  8 mg Intravenous 3 times per day  . diphenhydrAMINE  50 mg Oral Once  . ondansetron (ZOFRAN) IVPB [facility] +/- dexamethasone   Intravenous Once  . pantoprazole (PROTONIX) IV  40 mg Intravenous Q12H  . riTUXimab (RITUXAN) IV infusion  375 mg/m2 (Treatment Plan Actual) Intravenous Once  . senna-docusate  2 tablet Oral BID   Continuous Infusions: . sodium chloride 20 mL/hr at 09/06/15 0953   PRN Meds:.sodium chloride, acetaminophen, albuterol, alteplase, alteplase, Cold Pack, diphenhydrAMINE, diphenhydrAMINE, EPINEPHrine, EPINEPHrine, EPINEPHrine, EPINEPHrine, famotidine, heparin lock flush, heparin lock flush, heparin lock flush, heparin lock flush, Hot Pack, HYDROmorphone (DILAUDID) injection, LORazepam, methylPREDNISolone sodium succinate, ondansetron **OR** ondansetron **OR** ondansetron (ZOFRAN) IV **OR** ondansetron (ZOFRAN) IV, sodium chloride, sodium chloride, sodium chloride, sodium chloride  REVIEW OF SYSTEMS:    10 Point review of Systems was done is negative except as noted above.   LABORATORY DATA:  I have reviewed the data as listed  . CBC Latest Ref Rng 09/11/2015 09/10/2015 09/09/2015  WBC 4.0 - 10.5 K/uL 3.0(L) 4.7 9.0  Hemoglobin 13.0 - 17.0 g/dL 11.0(L) 10.1(L) 9.8(L)  Hematocrit 39.0 - 52.0 % 31.0(L) 30.1(L) 29.4(L)  Platelets 150 - 400 K/uL 323 242 264    . CMP Latest Ref Rng 09/11/2015 09/10/2015 09/09/2015  Glucose 65 - 99 mg/dL 136(H) 87 87  BUN 6 - 20 mg/dL 22(H) 17 19  Creatinine 0.61 - 1.24 mg/dL 0.64 0.70 0.66  Sodium 135 - 145 mmol/L  137 140 138  Potassium 3.5 - 5.1 mmol/L 3.9 3.5 3.2(L)  Chloride 101 - 111 mmol/L 101 106 105  CO2 22 - 32 mmol/L 26 29 27   Calcium 8.9 - 10.3 mg/dL 9.4 8.7(L) 8.6(L)  Total Protein 6.5 - 8.1 g/dL 6.1(L) 5.5(L) 5.5(L)  Total Bilirubin 0.3 - 1.2 mg/dL 2.4(H) 0.9 0.5  Alkaline Phos 38 - 126 U/L 45 41 41  AST 15 - 41 U/L 42(H) 30 21  ALT 17 - 63 U/L 64(H) 43 32     RADIOGRAPHIC STUDIES: I have personally reviewed the radiological images as listed and agreed with the findings in the report. Ct Abdomen Pelvis W Contrast  08/26/2015  CLINICAL DATA:  Vomiting, diffuse abdominal pain, chemotherapy for lymphoma 3 days ago EXAM: CT ABDOMEN AND PELVIS WITH CONTRAST TECHNIQUE: Multidetector CT imaging of the abdomen and pelvis was performed using the standard protocol following bolus administration of intravenous contrast. CONTRAST:  144mL OMNIPAQUE IOHEXOL 300 MG/ML  SOLN COMPARISON:  08/22/2015 FINDINGS: Lung bases are unremarkable. Sagittal images of the spine are unremarkable.  No calcified gallstones are noted within gallbladder. Liver, spleen, pancreas and adrenal glands are unremarkable. No aortic aneurysm. Kidneys are symmetrical in size and enhancement. No hydronephrosis or hydroureter. Delayed renal images shows bilateral renal symmetrical excretion. Bilateral visualized proximal ureter is unremarkable. Mild dilated small bowel loops distal abdomen with some air-fluid level. There is no transition point in caliber of small bowel. Findings suspicious for mild ileus or enteritis. Abundant stool noted in right colon transverse colon. Moderate stool noted in descending colon. Small amount of free fluid noted in right paracolic gutter. No colitis or diverticulitis. No free abdominal air. Prostate gland and seminal vesicles are unremarkable. Nonspecific mild thickening of urinary bladder wall. There is no inguinal adenopathy. No destructive bony lesions are noted within pelvis. The patient is status post  appendectomy. IMPRESSION: 1. Mild distended small bowel loops in lower abdomen with some air-fluid levels without transition point in caliber. Findings suspicious for ileus or enteritis. 2. Abundant stool noted in right colon and transverse colon. Moderate stool noted in descending colon. Small amount of free fluid in right paracolic gutter. 3. No hydronephrosis or hydroureter. Bilateral renal symmetrical excretion. Electronically Signed   By: Lahoma Crocker M.D.   On: 08/26/2015 17:09   Nm Pet Image Restag (ps) Skull Base To Thigh  08/22/2015  CLINICAL DATA:  Subsequent treatment strategy for Burkitt's lymphoma status post 2 cycles chemotherapy. EXAM: NUCLEAR MEDICINE PET SKULL BASE TO THIGH TECHNIQUE: 8.8 mCi F-18 FDG was injected intravenously. Full-ring PET imaging was performed from the skull base to thigh after the radiotracer. CT data was obtained and used for attenuation correction and anatomic localization. FASTING BLOOD GLUCOSE:  Value: 94 mg/dl COMPARISON:  07/23/2015 PET-CT. FINDINGS: NECK No hypermetabolic lymph nodes in the neck. Previously described right retroclavicular lymphadenopathy has resolved, with no residual hypermetabolism and no residual adenopathy on the CT. CHEST No hypermetabolic axillary, mediastinal or hilar nodes. Previously described right internal mammary and anterior mediastinal lymphadenopathy has resolved, with no residual hypermetabolism and no residual adenopathy on the CT. Right internal jugular MediPort terminates at the cavoatrial junction. No acute consolidative airspace disease or significant pulmonary nodules. ABDOMEN/PELVIS The previously described diffuse peritoneal thickening and hypermetabolism has completely resolved. The previously described hypermetabolic wall thickening in the proximal small bowel loop in the left abdomen has completely resolved. There is complete metabolic resolution of the previously described central mesenteric adenopathy, with a residual 1.1  cm central mesenteric node (series 4/image 133), significantly decreased from 3.0 cm. There is mild homogeneous hypermetabolism throughout the normal size spleen (max splenic SUV 5.9, previously 4.0), with no splenic mass, most in keeping with reactive splenic uptake given the reactive marrow uptake throughout the skeleton. No abnormal hypermetabolic activity within the liver, pancreas or adrenal glands. No hypermetabolic lymph nodes in the abdomen or pelvis. Nonobstructing 2 mm stone in the right lower kidney. No hydronephrosis. Stable mild prostatomegaly with nonspecific internal prostatic calcification. A small focus of hypermetabolism in the central prostate is favored to represent hypermetabolic urine within the prostatic urethra. SKELETON There is new homogeneous hypermetabolism throughout the visualized skeleton, most prominent in the thoracolumbar spine vertebral bodies, in keeping with benign reactive marrow uptake. No focal hypermetabolic activity to suggest skeletal metastasis. IMPRESSION: 1. Complete metabolic response. No residual hypermetabolic lymphoma. 2. Benign reactive splenic and diffuse marrow uptake in the setting of ongoing chemotherapy. Spleen is normal size with no apparent splenic masses. 3. Nonobstructing 2 mm right lower renal stone. Electronically Signed   By: Ilona Sorrel  M.D.   On: 08/22/2015 13:11   Dg Abd Portable 2v  09/10/2015  CLINICAL DATA:  Nausea and vomiting today, history of lymphoma on chemotherapy, evaluate for bowel obstruction. EXAM: PORTABLE ABDOMEN - 2 VIEW COMPARISON:  Abdominal pelvic CT scan of August 26, 2015 FINDINGS: There is increased stool burden throughout the colon. There is a small amount of gas within minimally distended small bowel loops. There are no free extraluminal gas collections. There are no abnormal soft tissue calcifications. The bony structures exhibit no acute abnormalities. IMPRESSION: Increase colonic stool burden suggests clinical  constipation. Mild distention of mid and distal small bowel loops is likely secondary to the stool burden in the colon the rather than to intrinsic small bowel obstructive processes. There is no evidence of perforation. Electronically Signed   By: David  Martinique M.D.   On: 09/10/2015 15:10   Dg Fluoro Guide Lumbar Puncture  09/06/2015  CLINICAL DATA:  Burkitt's lymphoma.  Intrathecal chemotherapy EXAM: FLUOROSCOPICALLY GUIDED LUMBAR PUNCTURE FOR INTRATHECAL CHEMOTHERAPY TECHNIQUE: Informed consent was obtained from the patient prior to the procedure, including potential complications of headache, allergy, and pain. A 'time out' was performed. With the patient prone, the lower back was prepped with Betadine. 1% Lidocaine was used for local anesthesia. Lumbar puncture was initially begun at the L3-4 level, but patient experienced unusual pain with paraspinal numbing and needle placement such that this level was abandoned and the L5-S1 level with shorter skin to canal distance was selected. After numbing, LP then performed at the L5-S1 using a 20 gauge needle with return of clear CSF. 11 cc were collected for requested labs. Pharmacy prepared methotrexate and steroid was slowly injected into the subarachnoid space. The patient tolerated the procedure well without apparent complication. FLUOROSCOPY TIME:  1 minutes 4 seconds IMPRESSION: Intrathecal injection of chemotherapy without complication. Electronically Signed   By: Monte Fantasia M.D.   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 Cytogenetics showed Myc Break apart event associated with chromosomal variants of Burkitt's lymphoma t(2;8) and t(8;22). Patient has completed the cycle 1 and 2 of R -EPOCH as inpatient and tolerated it well. PET/CT scan after 2 cycles showed complete radiographic remission. LDH levels have normalized too. Patient is receiving his  third cycle of R-EPOCH in the hospital at this time. Has completed 4 of 5 days. Received intrathecal methotrexate day 1 for CNS prophylaxis. D5 cyclophosphamide and Rituxan was held yesterday due to persistent nausea and headaches. D5 intrathecal methotrexate has been held for the cycle.  Plan -Will hold D5 intrathecal methotrexate this cycle -Will proceed with D5 cyclophosphamide and Rituxan tomorrow if symptoms well controlled.  #2 persistent headaches likely from mild CSF leak. Less likely from chemical arachnoiditis from his intrathecal methotrexate -Continue IV dexamethasone every 8 hours as ordered. Burnis Medin consult IR for possible epidural blood patch tomorrow if persistent orthostatic headaches despite IV fluids and complete bed rest.  #3 persistent nausea. 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 Much improved with IV dexamethasone. Received Emend and promethazine yesterday. Was also partly because he did not take his oral prednisone as ordered on the last few days. Plan -Anti-emetics ordered.  #4 Constipation -likely from vincristine and opiates. Patient passing gas. -2-3 tab senna S 2 times a day -magnesium citrate once tomorrow if no bowel moveme abdominal x-ray shows large amount of stools. No overt obstruction or  perforation.   I'll continue to follow daily . Provided significant supportive counseling the patient .  I spent 40 minutes counseling the patient face to face. The total time spent in the appointment was 45 minutes and more than 50% was on counseling and direct patient cares.    Sullivan Lone MD [place] AAHIVMS [facility] Hematology/Oncology Physician [facility]  (Office):       [phone] (Work cell):  [phone] (Fax):           [phone]

## 2015-09-11 NOTE — Progress Notes (Deleted)
[name]   HEMATOLOGY/ONCOLOGY INPATIENT PROGRESS NOTE  Date of Service: 09/10/2015  Inpatient Attending: .Brunetta Genera, MD   SUBJECTIVE  Patient was seen around noon. Notes significant headaches which appear to be worse with sitting up. No focal neurological deficits. Also notes significant uncontrollable nausea and food aversion and has not eaten much in the last few days. Significant constipation as well. Does not want any additional chemotherapy at this time to low symptoms are better controlled which is reasonable. We adjusted a bunch of his meds for pain, nausea and constipation and plan to do his day 5 cycle cyclophosphamide plus Rituxan tomorrow if he is more comfortable. No fevers or chills. He was given IV dexamethasone, IV fluids, Phenergan and Dilaudid for symptom control. I visited him again in the evening and he was noted to be much more comfortable with good control of his headaches and nausea. We discussed completely bedrest with a plan to consider an epidural blood patch if his headaches were persistent.  OBJECTIVE:  PHYSICAL EXAMINATION: . Filed Vitals:   09/09/15 2132 09/10/15 1445 09/10/15 2328 09/11/15 0541  BP: 115/82 117/53 105/51 110/61  Pulse: 97 52 52 65  Temp:  98.5 F (36.9 C) 97.8 F (36.6 C) 98.2 F (36.8 C)  TempSrc: Oral Oral Oral Oral  Resp: 16 16 16 16   Height:      Weight:    168 lb (76.204 kg)  SpO2: 97% 99% 98% 98%   Filed Weights   09/08/15 0523 09/09/15 0550 09/11/15 0541  Weight: 176 lb 1.6 oz (79.878 kg) 175 lb 14.4 oz (79.788 kg) 168 lb (76.204 kg)   .Body mass index is 23.44 kg/(m^2).  GENERAL:alert, in no acute distress and comfortable SKIN: skin color, texture, turgor are normal, no rashes or significant lesions EYES: normal, conjunctiva are pink and non-injected, sclera clear OROPHARYNX:no exudate, no erythema and lips, buccal mucosa, and tongue normal  NECK: supple, no JVD, thyroid normal size, non-tender, without  nodularity LYMPH:  no palpable lymphadenopathy in the cervical, axillary or inguinal LUNGS: clear to auscultation with normal respiratory effort HEART: regular rate & rhythm,  no murmurs and no lower extremity edema ABDOMEN: abdomen soft, non-tender, normoactive bowel sounds  Musculoskeletal: no cyanosis of digits and no clubbing  PSYCH: alert & oriented x 3 with fluent speech NEURO: no focal motor/sensory deficits  MEDICAL HISTORY:  Past Medical History  Diagnosis Date  . EBV infection 2013    Patient notes that he had an EBV infection in 2013 that left him with chronic fatigue. She also notes that he had significant lymphadenopathy and thyroiditis at the time. He has been managing his symptoms with an alternative medicine practitioner in [place] and with other alternative medicines.  . Marijuana use, episodic   . Complication of anesthesia   . Burkitt lymphoma ([place]) 08/15/2015    SURGICAL HISTORY: Past Surgical History  Procedure Laterality Date  . Tonsillectomy    . Appendectomy    . Laparoscopy N/A 07/20/2015    Procedure: LAPAROSCOPY DIAGNOSTIC, MULTIPLE BIOPSIES, DRAINAGE OF ASCITES;  Surgeon: Fanny Skates, MD;  Location: [facility];  Service: General;  Laterality: N/A;    SOCIAL HISTORY: Social History   Social History  . Marital Status: Married    Spouse Name: N/A  . Number of Children: N/A  . Years of Education: N/A   Occupational History  . Not on file.   Social History Main Topics  . Smoking status: Never Smoker   . Smokeless tobacco: Never Used  .  Alcohol Use: No  . Drug Use: 1.00 per week    Special: Marijuana  . Sexual Activity: Yes   Other Topics Concern  . 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   He has a fiance and has been in a steady relationship.      He has tried to maintain a very healthy lifestyle and cycles about 100 miles a week. He also  uses a fair number of over-the-counter alternative medicines to stay healthy.    FAMILY HISTORY: Family History  Problem Relation Age of Onset  . Heart failure Father     ALLERGIES:  is allergic to levaquin and sulfa antibiotics.  MEDICATIONS:  Scheduled Meds: . sodium chloride   Intravenous Once  . acetaminophen  650 mg Oral Once  . cyclophosphamide  750 mg/m2 (Treatment Plan Actual) Intravenous Once  . dexamethasone  8 mg Intravenous 3 times per day  . diphenhydrAMINE  50 mg Oral Once  . ondansetron (ZOFRAN) IVPB [facility] +/- dexamethasone   Intravenous Once  . pantoprazole (PROTONIX) IV  40 mg Intravenous Q12H  . riTUXimab (RITUXAN) IV infusion  375 mg/m2 (Treatment Plan Actual) Intravenous Once  . senna-docusate  2 tablet Oral BID   Continuous Infusions: . sodium chloride 20 mL/hr at 09/06/15 0953   PRN Meds:.sodium chloride, acetaminophen, albuterol, alteplase, alteplase, Cold Pack, diphenhydrAMINE, diphenhydrAMINE, EPINEPHrine, EPINEPHrine, EPINEPHrine, EPINEPHrine, famotidine, heparin lock flush, heparin lock flush, heparin lock flush, heparin lock flush, Hot Pack, HYDROmorphone (DILAUDID) injection, LORazepam, methylPREDNISolone sodium succinate, ondansetron **OR** ondansetron **OR** ondansetron (ZOFRAN) IV **OR** ondansetron (ZOFRAN) IV, sodium chloride, sodium chloride, sodium chloride, sodium chloride  REVIEW OF SYSTEMS:    10 Point review of Systems was done is negative except as noted above.   LABORATORY DATA:  I have reviewed the data as listed  . CBC Latest Ref Rng 09/11/2015 09/10/2015 09/09/2015  WBC 4.0 - 10.5 K/uL 3.0(L) 4.7 9.0  Hemoglobin 13.0 - 17.0 g/dL 11.0(L) 10.1(L) 9.8(L)  Hematocrit 39.0 - 52.0 % 31.0(L) 30.1(L) 29.4(L)  Platelets 150 - 400 K/uL 323 242 264    . CMP Latest Ref Rng 09/11/2015 09/10/2015 09/09/2015  Glucose 65 - 99 mg/dL 136(H) 87 87  BUN 6 - 20 mg/dL 22(H) 17 19  Creatinine 0.61 - 1.24 mg/dL 0.64 0.70 0.66  Sodium 135 - 145 mmol/L  137 140 138  Potassium 3.5 - 5.1 mmol/L 3.9 3.5 3.2(L)  Chloride 101 - 111 mmol/L 101 106 105  CO2 22 - 32 mmol/L 26 29 27   Calcium 8.9 - 10.3 mg/dL 9.4 8.7(L) 8.6(L)  Total Protein 6.5 - 8.1 g/dL 6.1(L) 5.5(L) 5.5(L)  Total Bilirubin 0.3 - 1.2 mg/dL 2.4(H) 0.9 0.5  Alkaline Phos 38 - 126 U/L 45 41 41  AST 15 - 41 U/L 42(H) 30 21  ALT 17 - 63 U/L 64(H) 43 32     RADIOGRAPHIC STUDIES: I have personally reviewed the radiological images as listed and agreed with the findings in the report. Ct Abdomen Pelvis W Contrast  08/26/2015  CLINICAL DATA:  Vomiting, diffuse abdominal pain, chemotherapy for lymphoma 3 days ago EXAM: CT ABDOMEN AND PELVIS WITH CONTRAST TECHNIQUE: Multidetector CT imaging of the abdomen and pelvis was performed using the standard protocol following bolus administration of intravenous contrast. CONTRAST:  128mL OMNIPAQUE IOHEXOL 300 MG/ML  SOLN COMPARISON:  08/22/2015 FINDINGS: Lung bases are unremarkable. Sagittal images of the spine are unremarkable.  No calcified gallstones are noted within gallbladder. Liver, spleen, pancreas and adrenal glands are unremarkable. No aortic aneurysm. Kidneys are symmetrical in size and enhancement. No hydronephrosis or hydroureter. Delayed renal images shows bilateral renal symmetrical excretion. Bilateral visualized proximal ureter is unremarkable. Mild dilated small bowel loops distal abdomen with some air-fluid level. There is no transition point in caliber of small bowel. Findings suspicious for mild ileus or enteritis. Abundant stool noted in right colon transverse colon. Moderate stool noted in descending colon. Small amount of free fluid noted in right paracolic gutter. No colitis or diverticulitis. No free abdominal air. Prostate gland and seminal vesicles are unremarkable. Nonspecific mild thickening of urinary bladder wall. There is no inguinal adenopathy. No destructive bony lesions are noted within pelvis. The patient is status post  appendectomy. IMPRESSION: 1. Mild distended small bowel loops in lower abdomen with some air-fluid levels without transition point in caliber. Findings suspicious for ileus or enteritis. 2. Abundant stool noted in right colon and transverse colon. Moderate stool noted in descending colon. Small amount of free fluid in right paracolic gutter. 3. No hydronephrosis or hydroureter. Bilateral renal symmetrical excretion. Electronically Signed   By: Lahoma Crocker M.D.   On: 08/26/2015 17:09   Nm Pet Image Restag (ps) Skull Base To Thigh  08/22/2015  CLINICAL DATA:  Subsequent treatment strategy for Burkitt's lymphoma status post 2 cycles chemotherapy. EXAM: NUCLEAR MEDICINE PET SKULL BASE TO THIGH TECHNIQUE: 8.8 mCi F-18 FDG was injected intravenously. Full-ring PET imaging was performed from the skull base to thigh after the radiotracer. CT data was obtained and used for attenuation correction and anatomic localization. FASTING BLOOD GLUCOSE:  Value: 94 mg/dl COMPARISON:  07/23/2015 PET-CT. FINDINGS: NECK No hypermetabolic lymph nodes in the neck. Previously described right retroclavicular lymphadenopathy has resolved, with no residual hypermetabolism and no residual adenopathy on the CT. CHEST No hypermetabolic axillary, mediastinal or hilar nodes. Previously described right internal mammary and anterior mediastinal lymphadenopathy has resolved, with no residual hypermetabolism and no residual adenopathy on the CT. Right internal jugular MediPort terminates at the cavoatrial junction. No acute consolidative airspace disease or significant pulmonary nodules. ABDOMEN/PELVIS The previously described diffuse peritoneal thickening and hypermetabolism has completely resolved. The previously described hypermetabolic wall thickening in the proximal small bowel loop in the left abdomen has completely resolved. There is complete metabolic resolution of the previously described central mesenteric adenopathy, with a residual 1.1  cm central mesenteric node (series 4/image 133), significantly decreased from 3.0 cm. There is mild homogeneous hypermetabolism throughout the normal size spleen (max splenic SUV 5.9, previously 4.0), with no splenic mass, most in keeping with reactive splenic uptake given the reactive marrow uptake throughout the skeleton. No abnormal hypermetabolic activity within the liver, pancreas or adrenal glands. No hypermetabolic lymph nodes in the abdomen or pelvis. Nonobstructing 2 mm stone in the right lower kidney. No hydronephrosis. Stable mild prostatomegaly with nonspecific internal prostatic calcification. A small focus of hypermetabolism in the central prostate is favored to represent hypermetabolic urine within the prostatic urethra. SKELETON There is new homogeneous hypermetabolism throughout the visualized skeleton, most prominent in the thoracolumbar spine vertebral bodies, in keeping with benign reactive marrow uptake. No focal hypermetabolic activity to suggest skeletal metastasis. IMPRESSION: 1. Complete metabolic response. No residual hypermetabolic lymphoma. 2. Benign reactive splenic and diffuse marrow uptake in the setting of ongoing chemotherapy. Spleen is normal size with no apparent splenic masses. 3. Nonobstructing 2 mm right lower renal stone. Electronically Signed   By: Ilona Sorrel  M.D.   On: 08/22/2015 13:11   Dg Abd Portable 2v  09/10/2015  CLINICAL DATA:  Nausea and vomiting today, history of lymphoma on chemotherapy, evaluate for bowel obstruction. EXAM: PORTABLE ABDOMEN - 2 VIEW COMPARISON:  Abdominal pelvic CT scan of August 26, 2015 FINDINGS: There is increased stool burden throughout the colon. There is a small amount of gas within minimally distended small bowel loops. There are no free extraluminal gas collections. There are no abnormal soft tissue calcifications. The bony structures exhibit no acute abnormalities. IMPRESSION: Increase colonic stool burden suggests clinical  constipation. Mild distention of mid and distal small bowel loops is likely secondary to the stool burden in the colon the rather than to intrinsic small bowel obstructive processes. There is no evidence of perforation. Electronically Signed   By: David  Martinique M.D.   On: 09/10/2015 15:10   Dg Fluoro Guide Lumbar Puncture  09/06/2015  CLINICAL DATA:  Burkitt's lymphoma.  Intrathecal chemotherapy EXAM: FLUOROSCOPICALLY GUIDED LUMBAR PUNCTURE FOR INTRATHECAL CHEMOTHERAPY TECHNIQUE: Informed consent was obtained from the patient prior to the procedure, including potential complications of headache, allergy, and pain. A 'time out' was performed. With the patient prone, the lower back was prepped with Betadine. 1% Lidocaine was used for local anesthesia. Lumbar puncture was initially begun at the L3-4 level, but patient experienced unusual pain with paraspinal numbing and needle placement such that this level was abandoned and the L5-S1 level with shorter skin to canal distance was selected. After numbing, LP then performed at the L5-S1 using a 20 gauge needle with return of clear CSF. 11 cc were collected for requested labs. Pharmacy prepared methotrexate and steroid was slowly injected into the subarachnoid space. The patient tolerated the procedure well without apparent complication. FLUOROSCOPY TIME:  1 minutes 4 seconds IMPRESSION: Intrathecal injection of chemotherapy without complication. Electronically Signed   By: Monte Fantasia M.D.   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 Cytogenetics showed Myc Break apart event associated with chromosomal variants of Burkitt's lymphoma t(2;8) and t(8;22). Patient has completed the cycle 1 and 2 of R -EPOCH as inpatient and tolerated it well. PET/CT scan after 2 cycles showed complete radiographic remission. LDH levels have normalized too. Patient is receiving his  third cycle of R-EPOCH in the hospital at this time. Has completed 4 of 5 days. Received intrathecal methotrexate day 1 for CNS prophylaxis. D5 cyclophosphamide and Rituxan was held yesterday due to persistent nausea and headaches. D5 intrathecal methotrexate has been held for the cycle.  Plan -Will hold D5 intrathecal methotrexate this cycle -Will proceed with D5 cyclophosphamide and Rituxan tomorrow if symptoms well controlled.  #2 persistent headaches likely from mild CSF leak. Less likely from chemical arachnoiditis from his intrathecal methotrexate -Continue IV dexamethasone every 8 hours as ordered. Burnis Medin consult IR for possible epidural blood patch tomorrow if persistent orthostatic headaches despite IV fluids and complete bed rest.  #3 persistent nausea. 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 Much improved with IV dexamethasone. Received Emend and promethazine yesterday. Was also partly because he did not take his oral prednisone as ordered on the last few days. Plan -Anti-emetics ordered.  #4 Constipation -likely from vincristine and opiates. Patient passing gas. -2-3 tab senna S 2 times a day -magnesium citrate once tomorrow if no bowel moveme abdominal x-ray shows large amount of stools. No overt obstruction or  perforation.   I'll continue to follow daily . Provided significant supportive counseling the patient .  I spent 40 minutes counseling the patient face to face. The total time spent in the appointment was 45 minutes and more than 50% was on counseling and direct patient cares.    Sullivan Lone MD [place] AAHIVMS [facility] Hematology/Oncology Physician [facility]  (Office):       [phone] (Work cell):  [phone] (Fax):           [phone]

## 2015-09-12 ENCOUNTER — Other Ambulatory Visit: Payer: Self-pay | Admitting: *Deleted

## 2015-09-12 DIAGNOSIS — G971 Other reaction to spinal and lumbar puncture: Secondary | ICD-10-CM | POA: Insufficient documentation

## 2015-09-12 DIAGNOSIS — R51 Headache: Secondary | ICD-10-CM

## 2015-09-12 DIAGNOSIS — R1110 Vomiting, unspecified: Secondary | ICD-10-CM | POA: Insufficient documentation

## 2015-09-12 DIAGNOSIS — K5900 Constipation, unspecified: Secondary | ICD-10-CM | POA: Insufficient documentation

## 2015-09-12 DIAGNOSIS — G970 Cerebrospinal fluid leak from spinal puncture: Secondary | ICD-10-CM | POA: Insufficient documentation

## 2015-09-12 DIAGNOSIS — R519 Headache, unspecified: Secondary | ICD-10-CM | POA: Insufficient documentation

## 2015-09-12 LAB — CBC WITH DIFFERENTIAL/PLATELET
BASOS PCT: 0 %
Basophils Absolute: 0 10*3/uL (ref 0.0–0.1)
EOS ABS: 0 10*3/uL (ref 0.0–0.7)
EOS PCT: 0 %
HCT: 26.7 % — ABNORMAL LOW (ref 39.0–52.0)
HEMOGLOBIN: 9.5 g/dL — AB (ref 13.0–17.0)
Lymphocytes Relative: 4 %
Lymphs Abs: 0.3 10*3/uL — ABNORMAL LOW (ref 0.7–4.0)
MCH: 29.7 pg (ref 26.0–34.0)
MCHC: 35.6 g/dL (ref 30.0–36.0)
MCV: 83.4 fL (ref 78.0–100.0)
MONOS PCT: 1 %
Monocytes Absolute: 0 10*3/uL — ABNORMAL LOW (ref 0.1–1.0)
NEUTROS PCT: 95 %
Neutro Abs: 6.1 10*3/uL (ref 1.7–7.7)
PLATELETS: 262 10*3/uL (ref 150–400)
RBC: 3.2 MIL/uL — AB (ref 4.22–5.81)
RDW: 14.3 % (ref 11.5–15.5)
WBC: 6.4 10*3/uL (ref 4.0–10.5)

## 2015-09-12 LAB — GLUCOSE, CAPILLARY: GLUCOSE-CAPILLARY: 125 mg/dL — AB (ref 65–99)

## 2015-09-12 NOTE — Progress Notes (Signed)
Pt discharged home with significant other in stable condition.  Discharge instructions given. Pt verbalized understanding 

## 2015-09-12 NOTE — Progress Notes (Addendum)
[place]  [phone] [phone]    Physician Discharge Summary     Patient ID: Donald [name] MRN: [id number] DOB/AGE: [birth year] 39 y.o.  Admit date: 09/05/2015 Discharge date: 09/12/2015  Primary Care Physician:  Drema Pry, DO   Discharge Diagnoses:    #1 Burkitt's lymphoma received third cycle of R-EPOCH chemotherapy and intrathecal methotrexate for CNS prophylaxis. 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 #3 CSF leak due to spinal tap with spinal headaches requiring epidural blood patch #4 severe constipation due to vincristine and pain medications. 5 abnormal LFTs due to therapy  Present on Admission:  . Lymphoma ([place])  Discharge Medications:    Medication List    TAKE these medications        lactulose 10 GM/15ML solution  Commonly known as:  CHRONULAC  Take 30 mLs by mouth 2 (two) times daily as needed (For constipation.).     multivitamin with minerals Tabs tablet  Take 1 tablet by mouth daily.     ondansetron 8 MG tablet  Commonly known as:  ZOFRAN  Take 1 tablet (8 mg total) by mouth every 8 (eight) hours as needed for nausea or vomiting.     oxyCODONE 10 mg 12 hr tablet  Commonly known as:  OXYCONTIN  Take 1 tablet (10 mg total) by mouth every 12 (twelve) hours.     oxycodone 5 MG capsule  Commonly known as:  OXY-IR  Take 1 capsule (5 mg total) by mouth every 6 (six) hours as needed.     polyethylene glycol packet  Commonly known as:  MIRALAX / GLYCOLAX  Take 17 g by mouth daily.     PRESCRIPTION MEDICATION  Patient receives his chemo treatments at the [facility] with Dr. Irene Limbo. He received Neulasta 6mg  on 08/21/15. He is on a 21 day cycle of EPOCH-R that he receives during hospitalization.     senna-docusate 8.6-50 MG tablet  Commonly known as:  Senokot-S  Take 2 tablets by mouth 2 (two) times  daily.     sodium bicarbonate/sodium chloride Soln  1 application by Mouth Rinse route 4 (four) times daily.         Disposition and Follow-up:   Significant Diagnostic Studies:  Ct Abdomen Pelvis W Contrast  08/26/2015  CLINICAL DATA:  Vomiting, diffuse abdominal pain, chemotherapy for lymphoma 3 days ago EXAM: CT ABDOMEN AND PELVIS WITH CONTRAST TECHNIQUE: Multidetector CT imaging of the abdomen and pelvis was performed using the standard protocol following bolus administration of intravenous contrast. CONTRAST:  16mL OMNIPAQUE IOHEXOL 300 MG/ML  SOLN COMPARISON:  08/22/2015 FINDINGS: Lung bases are unremarkable. Sagittal images of the spine are unremarkable. No calcified gallstones are noted within gallbladder. Liver, spleen, pancreas and adrenal glands are unremarkable. No aortic aneurysm. Kidneys are symmetrical in size and enhancement. No hydronephrosis or hydroureter. Delayed renal images shows bilateral renal symmetrical excretion. Bilateral visualized proximal ureter is unremarkable. Mild dilated small bowel loops distal abdomen with some air-fluid level. There is no transition point in caliber of small bowel. Findings suspicious for mild ileus or enteritis. Abundant stool noted in right colon transverse colon. Moderate stool noted in descending colon. Small amount of free fluid noted in right paracolic gutter. No colitis or diverticulitis. No free abdominal air. Prostate gland and seminal vesicles are unremarkable. Nonspecific mild thickening of urinary bladder wall. There is no inguinal adenopathy. No destructive bony lesions are  noted within pelvis. The patient is status post appendectomy. IMPRESSION: 1. Mild distended small bowel loops in lower abdomen with some air-fluid levels without transition point in caliber. Findings suspicious for ileus or enteritis. 2. Abundant stool noted in right colon and transverse colon. Moderate stool noted in descending colon. Small amount of free fluid in  right paracolic gutter. 3. No hydronephrosis or hydroureter. Bilateral renal symmetrical excretion. Electronically Signed   By: Lahoma Crocker M.D.   On: 08/26/2015 17:09   Nm Pet Image Restag (ps) Skull Base To Thigh  08/22/2015  CLINICAL DATA:  Subsequent treatment strategy for Burkitt's lymphoma status post 2 cycles chemotherapy. EXAM: NUCLEAR MEDICINE PET SKULL BASE TO THIGH TECHNIQUE: 8.8 mCi F-18 FDG was injected intravenously. Full-ring PET imaging was performed from the skull base to thigh after the radiotracer. CT data was obtained and used for attenuation correction and anatomic localization. FASTING BLOOD GLUCOSE:  Value: 94 mg/dl COMPARISON:  07/23/2015 PET-CT. FINDINGS: NECK No hypermetabolic lymph nodes in the neck. Previously described right retroclavicular lymphadenopathy has resolved, with no residual hypermetabolism and no residual adenopathy on the CT. CHEST No hypermetabolic axillary, mediastinal or hilar nodes. Previously described right internal mammary and anterior mediastinal lymphadenopathy has resolved, with no residual hypermetabolism and no residual adenopathy on the CT. Right internal jugular MediPort terminates at the cavoatrial junction. No acute consolidative airspace disease or significant pulmonary nodules. ABDOMEN/PELVIS The previously described diffuse peritoneal thickening and hypermetabolism has completely resolved. The previously described hypermetabolic wall thickening in the proximal small bowel loop in the left abdomen has completely resolved. There is complete metabolic resolution of the previously described central mesenteric adenopathy, with a residual 1.1 cm central mesenteric node (series 4/image 133), significantly decreased from 3.0 cm. There is mild homogeneous hypermetabolism throughout the normal size spleen (max splenic SUV 5.9, previously 4.0), with no splenic mass, most in keeping with reactive splenic uptake given the reactive marrow uptake throughout the  skeleton. No abnormal hypermetabolic activity within the liver, pancreas or adrenal glands. No hypermetabolic lymph nodes in the abdomen or pelvis. Nonobstructing 2 mm stone in the right lower kidney. No hydronephrosis. Stable mild prostatomegaly with nonspecific internal prostatic calcification. A small focus of hypermetabolism in the central prostate is favored to represent hypermetabolic urine within the prostatic urethra. SKELETON There is new homogeneous hypermetabolism throughout the visualized skeleton, most prominent in the thoracolumbar spine vertebral bodies, in keeping with benign reactive marrow uptake. No focal hypermetabolic activity to suggest skeletal metastasis. IMPRESSION: 1. Complete metabolic response. No residual hypermetabolic lymphoma. 2. Benign reactive splenic and diffuse marrow uptake in the setting of ongoing chemotherapy. Spleen is normal size with no apparent splenic masses. 3. Nonobstructing 2 mm right lower renal stone. Electronically Signed   By: Ilona Sorrel M.D.   On: 08/22/2015 13:11   Dg Abd Portable 2v  09/10/2015  CLINICAL DATA:  Nausea and vomiting today, history of lymphoma on chemotherapy, evaluate for bowel obstruction. EXAM: PORTABLE ABDOMEN - 2 VIEW COMPARISON:  Abdominal pelvic CT scan of August 26, 2015 FINDINGS: There is increased stool burden throughout the colon. There is a small amount of gas within minimally distended small bowel loops. There are no free extraluminal gas collections. There are no abnormal soft tissue calcifications. The bony structures exhibit no acute abnormalities. IMPRESSION: Increase colonic stool burden suggests clinical constipation. Mild distention of mid and distal small bowel loops is likely secondary to the stool burden in the colon the rather than to intrinsic small bowel obstructive processes. There  is no evidence of perforation. Electronically Signed   By: David  Martinique M.D.   On: 09/10/2015 15:10   Ir Epidurography  09/11/2015   CLINICAL DATA:  LP 09/06/2015 for intrathecal chemotherapy administration, Persistent postprocedure spinal positional headache despite conservative maneuvers EXAM: LUMBAR EPIDUROGRAM AND BLOOD PATCH FLUOROSCOPY TIME:  0.3 minutes, 17 uGym2 DAP COMPARISON:  LP 09/06/2015 TECHNIQUE: The skin entry site from previous lumbar puncture was localized at L5-S1. An appropriate skin entry site was determined. Operator donned sterile gloves and mask. Skin site was marked, prepped with Betadine, and draped in usual sterile fashion, and infiltrated locally with 1% lidocaine. The 20 gauge Crawford needle was advanced into the lumbar posterior epidural space near the midline using a right interlaminar approach at L5-S1using loss of resistance technique. Diagnostic injection of 87ml Omnipaque-180 demonstrates good epidural spread above and below the needle tip and crossing the midline. No intravascular or subarachnoid component. 26ml of autologous blood were then administered as lumbar epidural blood patch. No immediate complication. IMPRESSION: 1. Technically successful lumbar epidural blood patch under fluoroscopy. The patient was counseled on the importance of laying flat for an additional 24 hours and maintaining good p.o. fluid intake. Electronically Signed   By: Lucrezia Europe M.D.   On: 09/11/2015 13:09   Dg Fluoro Guide Lumbar Puncture  09/06/2015  CLINICAL DATA:  Burkitt's lymphoma.  Intrathecal chemotherapy EXAM: FLUOROSCOPICALLY GUIDED LUMBAR PUNCTURE FOR INTRATHECAL CHEMOTHERAPY TECHNIQUE: Informed consent was obtained from the patient prior to the procedure, including potential complications of headache, allergy, and pain. A 'time out' was performed. With the patient prone, the lower back was prepped with Betadine. 1% Lidocaine was used for local anesthesia. Lumbar puncture was initially begun at the L3-4 level, but patient experienced unusual pain with paraspinal numbing and needle placement such that this level was  abandoned and the L5-S1 level with shorter skin to canal distance was selected. After numbing, LP then performed at the L5-S1 using a 20 gauge needle with return of clear CSF. 11 cc were collected for requested labs. Pharmacy prepared methotrexate and steroid was slowly injected into the subarachnoid space. The patient tolerated the procedure well without apparent complication. FLUOROSCOPY TIME:  1 minutes 4 seconds IMPRESSION: Intrathecal injection of chemotherapy without complication. Electronically Signed   By: Monte Fantasia M.D.   On: 09/06/2015 15:42    Discharge Laboratory Values: . CBC Latest Ref Rng 09/12/2015 09/11/2015 09/10/2015  WBC 4.0 - 10.5 K/uL 6.4 3.0(L) 4.7  Hemoglobin 13.0 - 17.0 g/dL 9.5(L) 11.0(L) 10.1(L)  Hematocrit 39.0 - 52.0 % 26.7(L) 31.0(L) 30.1(L)  Platelets 150 - 400 K/uL 262 323 242    . CMP Latest Ref Rng 09/11/2015 09/10/2015 09/09/2015  Glucose 65 - 99 mg/dL 136(H) 87 87  BUN 6 - 20 mg/dL 22(H) 17 19  Creatinine 0.61 - 1.24 mg/dL 0.64 0.70 0.66  Sodium 135 - 145 mmol/L 137 140 138  Potassium 3.5 - 5.1 mmol/L 3.9 3.5 3.2(L)  Chloride 101 - 111 mmol/L 101 106 105  CO2 22 - 32 mmol/L 26 29 27   Calcium 8.9 - 10.3 mg/dL 9.4 8.7(L) 8.6(L)  Total Protein 6.5 - 8.1 g/dL 6.1(L) 5.5(L) 5.5(L)  Total Bilirubin 0.3 - 1.2 mg/dL 2.4(H) 0.9 0.5  Alkaline Phos 38 - 126 U/L 45 41 41  AST 15 - 41 U/L 42(H) 30 21  ALT 17 - 63 U/L 64(H) 43 32      Brief H and P:  [name] was admitted for his cycle 3  R-EPOCH for his Burkitt's lymphoma . He received day 1 intrathecal methotrexate as per plan for CNS prophylaxis. 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 Also developed significant orthostatic headaches due to spinal CSF leak requiring an epidural blood patch. Patient's nausea vomiting was well controlled with IV dexamethasone, emend and  Phenergan in addition to his Zofran premedication. He also had some issues with significant constipation likely from vincristine and his opiate pain medication which resolved with scheduled senna. Abdominal x-ray was done that did not show any evidence of obstruction or perforation. No fevers or chills. Mild bump in bilirubin and transaminases likely related to his chemotherapy. Patient's planned day 5 intrathecal methotrexate for CNS prophylaxis was held due to his uncontrolled symptoms and CSF leak. He was feeling very well on the day of discharge with no fevers or chills. CBC stable. Plan -he'll follow up for his Neulasta shot tomorrow at the [facility] -He will follow-up with me in clinic on 09/21/2015 with labs. -We will need to discuss the plan for his CNS prophylaxis to determine if we need to modify this going ahead. -She was urged to call me in the cancer center if any other concerns or questions arise. -He will be admitted for cycle 4 R-EPOCH on 09/28/2015.  Physical Exam at Discharge: BP 114/63 mmHg  Pulse 53  Temp(Src) 98 F (36.7 C) (Oral)  Resp 16  Ht 5\' 11"  (1.803 m)  Wt 171 lb (77.565 kg)  BMI 23.86 kg/m2  SpO2 96%  GENERAL:alert, in no acute distress and comfortable SKIN: skin color, texture, turgor are normal, no rashes or significant lesions EYES: normal, conjunctiva are pink and non-injected, sclera clear OROPHARYNX:no exudate, no erythema and lips, buccal mucosa, and tongue normal  NECK: supple, no JVD, thyroid normal size, non-tender, without nodularity LYMPH: no palpable lymphadenopathy in the cervical, axillary or inguinal LUNGS: clear to auscultation with normal respiratory effort HEART: regular rate & rhythm, no murmurs and no lower extremity edema ABDOMEN: abdomen soft, non-tender, normoactive bowel sounds  Musculoskeletal: no cyanosis of digits and no clubbing  PSYCH: alert & oriented x 3 with fluent speech NEURO: no focal motor/sensory  deficits   Hospital Course:  Active Problems:   Lymphoma ([place])   Cephalalgia   Uncontrollable vomiting   Constipation   Cerebrospinal fluid leak from spinal puncture   Spinal headache   Diet:  Regular  Activity:  Recommended predominantly bedrest night flat in bed for the next 24-48 hours to reduce the risk of recurrent CSF leak  Condition at Discharge:   Stable  Signed: Dr. Sullivan Lone MD [place] [phone]  09/12/2015, 11:57 AM  Total time spent discharging the patient more than 30 minutes

## 2015-09-13 ENCOUNTER — Ambulatory Visit (HOSPITAL_BASED_OUTPATIENT_CLINIC_OR_DEPARTMENT_OTHER): Payer: BLUE CROSS/BLUE SHIELD

## 2015-09-13 VITALS — BP 116/58 | HR 55 | Temp 98.2°F

## 2015-09-13 DIAGNOSIS — C8370 Burkitt lymphoma, unspecified site: Secondary | ICD-10-CM | POA: Diagnosis not present

## 2015-09-13 DIAGNOSIS — Z5189 Encounter for other specified aftercare: Secondary | ICD-10-CM

## 2015-09-13 MED ORDER — PEGFILGRASTIM INJECTION 6 MG/0.6ML ~~LOC~~
6.0000 mg | PREFILLED_SYRINGE | Freq: Once | SUBCUTANEOUS | Status: AC
  Administered 2015-09-13: 6 mg via SUBCUTANEOUS
  Filled 2015-09-13: qty 0.6

## 2015-09-14 NOTE — Discharge Summary (Signed)
[name]  [phone] [phone]    Physician Discharge Summary     Patient ID: Donald [name] MRN: [id number] DOB/AGE: 1976/02/19 39 y.o.  Admit date: 09/05/2015 Discharge date: 09/12/2015  Primary Care Physician:  Donald Pry, DO   Discharge Diagnoses:    #1 Burkitt's lymphoma received third cycle of R-EPOCH chemotherapy and intrathecal methotrexate for CNS prophylaxis. 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 #3 CSF leak due to spinal tap with spinal headaches requiring epidural blood patch #4 severe constipation due to vincristine and pain medications. 5 abnormal LFTs due to therapy  Present on Admission:  . Lymphoma ([place])  Discharge Medications:    Medication List    TAKE these medications        lactulose 10 GM/15ML solution  Commonly known as:  CHRONULAC  Take 30 mLs by mouth 2 (two) times daily as needed (For constipation.).     multivitamin with minerals Tabs tablet  Take 1 tablet by mouth daily.     ondansetron 8 MG tablet  Commonly known as:  ZOFRAN  Take 1 tablet (8 mg total) by mouth every 8 (eight) hours as needed for nausea or vomiting.     oxyCODONE 10 mg 12 hr tablet  Commonly known as:  OXYCONTIN  Take 1 tablet (10 mg total) by mouth every 12 (twelve) hours.     oxycodone 5 MG capsule  Commonly known as:  OXY-IR  Take 1 capsule (5 mg total) by mouth every 6 (six) hours as needed.     polyethylene glycol packet  Commonly known as:  MIRALAX / GLYCOLAX  Take 17 g by mouth daily.     PRESCRIPTION MEDICATION  Patient receives his chemo treatments at the [facility] with Dr. Irene Limbo. He received Neulasta 6mg  on 08/21/15. He is on a 21 day cycle of EPOCH-R that he receives during hospitalization.     senna-docusate 8.6-50 MG tablet  Commonly known as:  Senokot-S  Take 2 tablets by mouth 2 (two) times  daily.     sodium bicarbonate/sodium chloride Soln  1 application by Mouth Rinse route 4 (four) times daily.         Disposition and Follow-up:   Significant Diagnostic Studies:  Ct Abdomen Pelvis W Contrast  08/26/2015  CLINICAL DATA:  Vomiting, diffuse abdominal pain, chemotherapy for lymphoma 3 days ago EXAM: CT ABDOMEN AND PELVIS WITH CONTRAST TECHNIQUE: Multidetector CT imaging of the abdomen and pelvis was performed using the standard protocol following bolus administration of intravenous contrast. CONTRAST:  132mL OMNIPAQUE IOHEXOL 300 MG/ML  SOLN COMPARISON:  08/22/2015 FINDINGS: Lung bases are unremarkable. Sagittal images of the spine are unremarkable. No calcified gallstones are noted within gallbladder. Liver, spleen, pancreas and adrenal glands are unremarkable. No aortic aneurysm. Kidneys are symmetrical in size and enhancement. No hydronephrosis or hydroureter. Delayed renal images shows bilateral renal symmetrical excretion. Bilateral visualized proximal ureter is unremarkable. Mild dilated small bowel loops distal abdomen with some air-fluid level. There is no transition point in caliber of small bowel. Findings suspicious for mild ileus or enteritis. Abundant stool noted in right colon transverse colon. Moderate stool noted in descending colon. Small amount of free fluid noted in right paracolic gutter. No colitis or diverticulitis. No free abdominal air. Prostate gland and seminal vesicles are unremarkable. Nonspecific mild thickening of urinary bladder wall. There is no inguinal adenopathy. No destructive bony lesions are  noted within pelvis. The patient is status post appendectomy. IMPRESSION: 1. Mild distended small bowel loops in lower abdomen with some air-fluid levels without transition point in caliber. Findings suspicious for ileus or enteritis. 2. Abundant stool noted in right colon and transverse colon. Moderate stool noted in descending colon. Small amount of free fluid in  right paracolic gutter. 3. No hydronephrosis or hydroureter. Bilateral renal symmetrical excretion. Electronically Signed   By: Lahoma Crocker M.D.   On: 08/26/2015 17:09   Nm Pet Image Restag (ps) Skull Base To Thigh  08/22/2015  CLINICAL DATA:  Subsequent treatment strategy for Burkitt's lymphoma status post 2 cycles chemotherapy. EXAM: NUCLEAR MEDICINE PET SKULL BASE TO THIGH TECHNIQUE: 8.8 mCi F-18 FDG was injected intravenously. Full-ring PET imaging was performed from the skull base to thigh after the radiotracer. CT data was obtained and used for attenuation correction and anatomic localization. FASTING BLOOD GLUCOSE:  Value: 94 mg/dl COMPARISON:  07/23/2015 PET-CT. FINDINGS: NECK No hypermetabolic lymph nodes in the neck. Previously described right retroclavicular lymphadenopathy has resolved, with no residual hypermetabolism and no residual adenopathy on the CT. CHEST No hypermetabolic axillary, mediastinal or hilar nodes. Previously described right internal mammary and anterior mediastinal lymphadenopathy has resolved, with no residual hypermetabolism and no residual adenopathy on the CT. Right internal jugular MediPort terminates at the cavoatrial junction. No acute consolidative airspace disease or significant pulmonary nodules. ABDOMEN/PELVIS The previously described diffuse peritoneal thickening and hypermetabolism has completely resolved. The previously described hypermetabolic wall thickening in the proximal small bowel loop in the left abdomen has completely resolved. There is complete metabolic resolution of the previously described central mesenteric adenopathy, with a residual 1.1 cm central mesenteric node (series 4/image 133), significantly decreased from 3.0 cm. There is mild homogeneous hypermetabolism throughout the normal size spleen (max splenic SUV 5.9, previously 4.0), with no splenic mass, most in keeping with reactive splenic uptake given the reactive marrow uptake throughout the  skeleton. No abnormal hypermetabolic activity within the liver, pancreas or adrenal glands. No hypermetabolic lymph nodes in the abdomen or pelvis. Nonobstructing 2 mm stone in the right lower kidney. No hydronephrosis. Stable mild prostatomegaly with nonspecific internal prostatic calcification. A small focus of hypermetabolism in the central prostate is favored to represent hypermetabolic urine within the prostatic urethra. SKELETON There is new homogeneous hypermetabolism throughout the visualized skeleton, most prominent in the thoracolumbar spine vertebral bodies, in keeping with benign reactive marrow uptake. No focal hypermetabolic activity to suggest skeletal metastasis. IMPRESSION: 1. Complete metabolic response. No residual hypermetabolic lymphoma. 2. Benign reactive splenic and diffuse marrow uptake in the setting of ongoing chemotherapy. Spleen is normal size with no apparent splenic masses. 3. Nonobstructing 2 mm right lower renal stone. Electronically Signed   By: Ilona Sorrel M.D.   On: 08/22/2015 13:11   Dg Abd Portable 2v  09/10/2015  CLINICAL DATA:  Nausea and vomiting today, history of lymphoma on chemotherapy, evaluate for bowel obstruction. EXAM: PORTABLE ABDOMEN - 2 VIEW COMPARISON:  Abdominal pelvic CT scan of August 26, 2015 FINDINGS: There is increased stool burden throughout the colon. There is a small amount of gas within minimally distended small bowel loops. There are no free extraluminal gas collections. There are no abnormal soft tissue calcifications. The bony structures exhibit no acute abnormalities. IMPRESSION: Increase colonic stool burden suggests clinical constipation. Mild distention of mid and distal small bowel loops is likely secondary to the stool burden in the colon the rather than to intrinsic small bowel obstructive processes. There  is no evidence of perforation. Electronically Signed   By: David  Martinique M.D.   On: 09/10/2015 15:10   Ir Epidurography  09/11/2015   CLINICAL DATA:  LP 09/06/2015 for intrathecal chemotherapy administration, Persistent postprocedure spinal positional headache despite conservative maneuvers EXAM: LUMBAR EPIDUROGRAM AND BLOOD PATCH FLUOROSCOPY TIME:  0.3 minutes, 17 uGym2 DAP COMPARISON:  LP 09/06/2015 TECHNIQUE: The skin entry site from previous lumbar puncture was localized at L5-S1. An appropriate skin entry site was determined. Operator donned sterile gloves and mask. Skin site was marked, prepped with Betadine, and draped in usual sterile fashion, and infiltrated locally with 1% lidocaine. The 20 gauge Crawford needle was advanced into the lumbar posterior epidural space near the midline using a right interlaminar approach at L5-S1using loss of resistance technique. Diagnostic injection of 36ml Omnipaque-180 demonstrates good epidural spread above and below the needle tip and crossing the midline. No intravascular or subarachnoid component. 77ml of autologous blood were then administered as lumbar epidural blood patch. No immediate complication. IMPRESSION: 1. Technically successful lumbar epidural blood patch under fluoroscopy. The patient was counseled on the importance of laying flat for an additional 24 hours and maintaining good p.o. fluid intake. Electronically Signed   By: Lucrezia Europe M.D.   On: 09/11/2015 13:09   Dg Fluoro Guide Lumbar Puncture  09/06/2015  CLINICAL DATA:  Burkitt's lymphoma.  Intrathecal chemotherapy EXAM: FLUOROSCOPICALLY GUIDED LUMBAR PUNCTURE FOR INTRATHECAL CHEMOTHERAPY TECHNIQUE: Informed consent was obtained from the patient prior to the procedure, including potential complications of headache, allergy, and pain. A 'time out' was performed. With the patient prone, the lower back was prepped with Betadine. 1% Lidocaine was used for local anesthesia. Lumbar puncture was initially begun at the L3-4 level, but patient experienced unusual pain with paraspinal numbing and needle placement such that this level was  abandoned and the L5-S1 level with shorter skin to canal distance was selected. After numbing, LP then performed at the L5-S1 using a 20 gauge needle with return of clear CSF. 11 cc were collected for requested labs. Pharmacy prepared methotrexate and steroid was slowly injected into the subarachnoid space. The patient tolerated the procedure well without apparent complication. FLUOROSCOPY TIME:  1 minutes 4 seconds IMPRESSION: Intrathecal injection of chemotherapy without complication. Electronically Signed   By: Monte Fantasia M.D.   On: 09/06/2015 15:42    Discharge Laboratory Values: . CBC Latest Ref Rng 09/12/2015 09/11/2015 09/10/2015  WBC 4.0 - 10.5 K/uL 6.4 3.0(L) 4.7  Hemoglobin 13.0 - 17.0 g/dL 9.5(L) 11.0(L) 10.1(L)  Hematocrit 39.0 - 52.0 % 26.7(L) 31.0(L) 30.1(L)  Platelets 150 - 400 K/uL 262 323 242    . CMP Latest Ref Rng 09/11/2015 09/10/2015 09/09/2015  Glucose 65 - 99 mg/dL 136(H) 87 87  BUN 6 - 20 mg/dL 22(H) 17 19  Creatinine 0.61 - 1.24 mg/dL 0.64 0.70 0.66  Sodium 135 - 145 mmol/L 137 140 138  Potassium 3.5 - 5.1 mmol/L 3.9 3.5 3.2(L)  Chloride 101 - 111 mmol/L 101 106 105  CO2 22 - 32 mmol/L 26 29 27   Calcium 8.9 - 10.3 mg/dL 9.4 8.7(L) 8.6(L)  Total Protein 6.5 - 8.1 g/dL 6.1(L) 5.5(L) 5.5(L)  Total Bilirubin 0.3 - 1.2 mg/dL 2.4(H) 0.9 0.5  Alkaline Phos 38 - 126 U/L 45 41 41  AST 15 - 41 U/L 42(H) 30 21  ALT 17 - 63 U/L 64(H) 43 32      Brief H and P:  Donald [name] was admitted for his cycle 3  R-EPOCH for his Burkitt's lymphoma . He received day 1 intrathecal methotrexate as per plan for CNS prophylaxis. 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 Also developed significant orthostatic headaches due to spinal CSF leak requiring an epidural blood patch. Patient's nausea vomiting was well controlled with IV dexamethasone, emend and  Phenergan in addition to his Zofran premedication. He also had some issues with significant constipation likely from vincristine and his opiate pain medication which resolved with scheduled senna. Abdominal x-ray was done that did not show any evidence of obstruction or perforation. No fevers or chills. Mild bump in bilirubin and transaminases likely related to his chemotherapy. Patient's planned day 5 intrathecal methotrexate for CNS prophylaxis was held due to his uncontrolled symptoms and CSF leak. He was feeling very well on the day of discharge with no fevers or chills. CBC stable. Plan -he'll follow up for his Neulasta shot tomorrow at the [facility] -He will follow-up with me in clinic on 09/21/2015 with labs. -We will need to discuss the plan for his CNS prophylaxis to determine if we need to modify this going ahead. -She was urged to call me in the cancer center if any other concerns or questions arise. -He will be admitted for cycle 4 R-EPOCH on 09/28/2015.  Physical Exam at Discharge: BP 114/63 mmHg  Pulse 53  Temp(Src) 98 F (36.7 C) (Oral)  Resp 16  Ht 5\' 11"  (1.803 m)  Wt 171 lb (77.565 kg)  BMI 23.86 kg/m2  SpO2 96%  GENERAL:alert, in no acute distress and comfortable SKIN: skin color, texture, turgor are normal, no rashes or significant lesions EYES: normal, conjunctiva are pink and non-injected, sclera clear OROPHARYNX:no exudate, no erythema and lips, buccal mucosa, and tongue normal  NECK: supple, no JVD, thyroid normal size, non-tender, without nodularity LYMPH: no palpable lymphadenopathy in the cervical, axillary or inguinal LUNGS: clear to auscultation with normal respiratory effort HEART: regular rate & rhythm, no murmurs and no lower extremity edema ABDOMEN: abdomen soft, non-tender, normoactive bowel sounds  Musculoskeletal: no cyanosis of digits and no clubbing  PSYCH: alert & oriented x 3 with fluent speech NEURO: no focal motor/sensory  deficits   Hospital Course:  Active Problems:   Lymphoma ([place])   Cephalalgia   Uncontrollable vomiting   Constipation   Cerebrospinal fluid leak from spinal puncture   Spinal headache   Diet:  Regular  Activity:  Recommended predominantly bedrest night flat in bed for the next 24-48 hours to reduce the risk of recurrent CSF leak  Condition at Discharge:   Stable  Signed: Dr. Sullivan Lone MD [place] [phone]  09/14/2015, 2:05 PM  Total time spent discharging the patient more than 30 minutes

## 2015-09-21 ENCOUNTER — Ambulatory Visit (HOSPITAL_BASED_OUTPATIENT_CLINIC_OR_DEPARTMENT_OTHER): Payer: BLUE CROSS/BLUE SHIELD | Admitting: Hematology

## 2015-09-21 ENCOUNTER — Encounter: Payer: Self-pay | Admitting: Hematology

## 2015-09-21 ENCOUNTER — Other Ambulatory Visit (HOSPITAL_BASED_OUTPATIENT_CLINIC_OR_DEPARTMENT_OTHER): Payer: BLUE CROSS/BLUE SHIELD

## 2015-09-21 VITALS — BP 122/74 | HR 67 | Temp 98.3°F | Resp 18 | Ht 71.0 in | Wt 171.5 lb

## 2015-09-21 DIAGNOSIS — C8370 Burkitt lymphoma, unspecified site: Secondary | ICD-10-CM | POA: Diagnosis not present

## 2015-09-21 DIAGNOSIS — C8378 Burkitt lymphoma, lymph nodes of multiple sites: Secondary | ICD-10-CM

## 2015-09-21 LAB — COMPREHENSIVE METABOLIC PANEL (CC13)
ALBUMIN: 3.9 g/dL (ref 3.5–5.0)
ALK PHOS: 92 U/L (ref 40–150)
ALT: 26 U/L (ref 0–55)
AST: 19 U/L (ref 5–34)
Anion Gap: 10 mEq/L (ref 3–11)
BUN: 6.9 mg/dL — ABNORMAL LOW (ref 7.0–26.0)
CALCIUM: 9.4 mg/dL (ref 8.4–10.4)
CHLORIDE: 104 meq/L (ref 98–109)
CO2: 26 mEq/L (ref 22–29)
Creatinine: 0.8 mg/dL (ref 0.7–1.3)
GLUCOSE: 92 mg/dL (ref 70–140)
POTASSIUM: 4.1 meq/L (ref 3.5–5.1)
SODIUM: 140 meq/L (ref 136–145)
Total Bilirubin: <0.3 mg/dL (ref 0.20–1.20)
Total Protein: 6.2 g/dL — ABNORMAL LOW (ref 6.4–8.3)

## 2015-09-21 LAB — CBC & DIFF AND RETIC
BASO%: 1 % (ref 0.0–2.0)
Basophils Absolute: 0.3 10*3/uL — ABNORMAL HIGH (ref 0.0–0.1)
EOS ABS: 0 10*3/uL (ref 0.0–0.5)
EOS%: 0.2 % (ref 0.0–7.0)
HCT: 32.6 % — ABNORMAL LOW (ref 38.4–49.9)
HEMOGLOBIN: 10.7 g/dL — AB (ref 13.0–17.1)
Immature Retic Fract: 29.2 % — ABNORMAL HIGH (ref 3.00–10.60)
LYMPH%: 7.6 % — AB (ref 14.0–49.0)
MCH: 29.3 pg (ref 27.2–33.4)
MCHC: 32.8 g/dL (ref 32.0–36.0)
MCV: 89.3 fL (ref 79.3–98.0)
MONO#: 1.9 10*3/uL — ABNORMAL HIGH (ref 0.1–0.9)
MONO%: 7.7 % (ref 0.0–14.0)
NEUT%: 83.5 % — ABNORMAL HIGH (ref 39.0–75.0)
NEUTROS ABS: 20.1 10*3/uL — AB (ref 1.5–6.5)
Platelets: 185 10*3/uL (ref 140–400)
RBC: 3.65 10*6/uL — ABNORMAL LOW (ref 4.20–5.82)
RDW: 15.9 % — AB (ref 11.0–14.6)
RETIC %: 5.23 % — AB (ref 0.80–1.80)
Retic Ct Abs: 190.9 10*3/uL — ABNORMAL HIGH (ref 34.80–93.90)
WBC: 24 10*3/uL — AB (ref 4.0–10.3)
lymph#: 1.8 10*3/uL (ref 0.9–3.3)

## 2015-09-21 MED ORDER — SENNOSIDES-DOCUSATE SODIUM 8.6-50 MG PO TABS: 2.0000 | ORAL_TABLET | Freq: Two times a day (BID) | ORAL | Status: DC

## 2015-09-21 MED ORDER — OXYCODONE HCL 5 MG PO CAPS: 5.0000 mg | ORAL_CAPSULE | Freq: Four times a day (QID) | ORAL | Status: DC | PRN

## 2015-09-21 MED ORDER — OXYCODONE HCL ER 10 MG PO T12A: 10.0000 mg | EXTENDED_RELEASE_TABLET | Freq: Two times a day (BID) | ORAL | Status: DC

## 2015-09-21 MED ORDER — OXYCODONE HCL ER 10 MG PO T12A
10.0000 mg | EXTENDED_RELEASE_TABLET | Freq: Two times a day (BID) | ORAL | Status: DC
Start: 2015-09-21 — End: 2015-09-21

## 2015-09-24 NOTE — Progress Notes (Signed)
[name]    HEMATOLOGY/ONCOLOGY CLINIC NOTE  Date of Service: .09/21/2015  Patient Care Team: [name], DO as PCP - General (Internal Medicine)  CHIEF COMPLAINTS/PURPOSE OF CONSULTATION:  Posthospitalization follow-up for Burkitt's lymphoma  Diagnosis: Stage III Burkitt's lymphoma without CNS involvement  TREATMENT EPOCH-R + CNS prophylaxis as per NEJM 2013 Dunleavy et al. S/p 3 cycles of EPOCH-R s/p 1 dose of intrathecal methotrexate plus hydrocortisone for CNS prophylaxis INTERVAL HISTORY  [name] is is here for her scheduled clinic follow-up after his third cycle of inpatient EPOCH-R chemotherapy. He had some issues with nausea during his admission which were controlled with aggressive meds. Had D1 intrathecal methotrexate plus hydrocortisone for CNS prophylaxis. Unfortunately developed a CSF leak with spinal headaches that required an epidural blood patch.  Patient notes that he has felt more fatigued after this cycle. Notes some back pains which she feels might be related to her that the epidural blood patch. Headaches have resolved. He notes that he just feels overall like "he's been broken by chemotherapy". He notes that he has been more emotional feels like he needs a little bit of extra break prior to the next cycle of chemotherapy to recover and so he could have a reasonable Thanksgiving with his family. We discussed the pros and cons of this approach. After understanding the issues involved he makes an educated and informed decision to postpone his next cycle of EPOCH-R  To 10/08/2015. No fevers or chills. No other new concerning focal symptoms. No nausea. Still with some intermittent constipation has been using his laxatives. Note some decreased in oral intake and weight loss.    MEDICAL HISTORY:  Past Medical History  Diagnosis Date  . EBV infection 2013    Patient notes that he had an EBV infection in 2013 that left him with chronic fatigue. She also notes that  he had significant lymphadenopathy and thyroiditis at the time. He has been managing his symptoms with an alternative medicine practitioner in [place] and with other alternative medicines.  . Marijuana use, episodic   . Complication of anesthesia   . Burkitt lymphoma ([place]) 08/15/2015    SURGICAL HISTORY: Past Surgical History  Procedure Laterality Date  . Tonsillectomy    . Appendectomy    . Laparoscopy N/A 07/20/2015    Procedure: LAPAROSCOPY DIAGNOSTIC, MULTIPLE BIOPSIES, DRAINAGE OF ASCITES;  Surgeon: Fanny Skates, MD;  Location: [facility];  Service: General;  Laterality: N/A;    SOCIAL HISTORY: Social History   Social History  . Marital Status: Married    Spouse Name: N/A  . Number of Children: N/A  . Years of Education: N/A   Occupational History  . Not on file.   Social History Main Topics  . Smoking status: Never Smoker   . Smokeless tobacco: Never Used  . Alcohol Use: No  . Drug Use: 1.00 per week    Special: Marijuana  . Sexual Activity: Yes   Other Topics Concern  . 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   He has a fiance and has been in a steady relationship.      He has tried to maintain a very healthy lifestyle and cycles about 100 miles a week. He also uses a fair number of over-the-counter alternative medicines to stay healthy.    FAMILY HISTORY: Family History  Problem Relation Age of Onset  . Heart failure Father  ALLERGIES:  is allergic to levaquin and sulfa antibiotics.  MEDICATIONS:  Current Outpatient Prescriptions  Medication Sig Dispense Refill  . lactulose (CHRONULAC) 10 GM/15ML solution Take 30 mLs by mouth 2 (two) times daily as needed (For constipation.).   0  . Multiple Vitamin (MULTIVITAMIN WITH MINERALS) TABS tablet Take 1 tablet by mouth daily.    [name] oxyCODONE (OXYCONTIN) 10 mg 12 hr tablet Take 1 tablet (10 mg total) by mouth  every 12 (twelve) hours. 60 tablet 0  . polyethylene glycol (MIRALAX / GLYCOLAX) packet Take 17 g by mouth daily. 30 each 1  . PRESCRIPTION MEDICATION Patient receives his chemo treatments at the [facility] with Dr. Irene Limbo. He received Neulasta 6mg  on 08/21/15. He is on a 21 day cycle of EPOCH-R that he receives during hospitalization.    . senna-docusate (SENOKOT-S) 8.6-50 MG tablet Take 2 tablets by mouth 2 (two) times daily. 120 tablet 1  . sodium bicarbonate/sodium chloride SOLN 1 application by Mouth Rinse route 4 (four) times daily. 100 mL 0  . ondansetron (ZOFRAN) 8 MG tablet Take 1 tablet (8 mg total) by mouth every 8 (eight) hours as needed for nausea or vomiting. (Patient not taking: Reported on 09/21/2015) 30 tablet 3  . oxycodone (OXY-IR) 5 MG capsule Take 1 capsule (5 mg total) by mouth every 6 (six) hours as needed. 30 capsule 0   No current facility-administered medications for this visit.    REVIEW OF SYSTEMS:    10 Point review of Systems was done is negative except as noted above.  PHYSICAL EXAMINATION: ECOG PERFORMANCE STATUS: 1 - Symptomatic but completely ambulatory  . Filed Vitals:   09/21/15 0943  Height: 5\' 11"  (1.803 m)  Weight: 171 lb 8 oz (77.792 kg)   Filed Weights   09/21/15 0943  Weight: 171 lb 8 oz (77.792 kg)   .Body mass index is 23.93 kg/(m^2).  GENERAL:alert, mild emotional distress, feeling somewhat overwhelmed with the treatment. SKIN: skin color, texture, turgor are normal, no rashes or significant lesions EYES: normal, conjunctiva are pink and non-injected, sclera clear OROPHARYNX:no exudate, no erythema and lips, buccal mucosa, and tongue normal  NECK: supple, no JVD, thyroid normal size, non-tender, without nodularity LYMPH:  no palpable lymphadenopathy in the cervical, axillary or inguinal LUNGS: clear to auscultation with normal respiratory effort. HEART: regular rate & rhythm,  no murmurs and no lower  extremity edema ABDOMEN: abdomen soft, non-tender, normoactive bowel sounds  Musculoskeletal: no cyanosis of digits and no clubbing  PSYCH: alert & oriented x 3 with fluent speech NEURO: no focal motor/sensory deficits  LABORATORY DATA:  I have reviewed the data as listed  . CBC Latest Ref Rng 09/21/2015 09/12/2015 09/11/2015  WBC 4.0 - 10.3 10e3/uL 24.0(H) 6.4 3.0(L)  Hemoglobin 13.0 - 17.1 g/dL 10.7(L) 9.5(L) 11.0(L)  Hematocrit 38.4 - 49.9 % 32.6(L) 26.7(L) 31.0(L)  Platelets 140 - 400 10e3/uL 185 262 323    . CMP Latest Ref Rng 09/21/2015 09/11/2015 09/10/2015  Glucose 70 - 140 mg/dl 92 136(H) 87  BUN 7.0 - 26.0 mg/dL 6.9(L) 22(H) 17  Creatinine 0.7 - 1.3 mg/dL 0.8 0.64 0.70  Sodium 136 - 145 mEq/L 140 137 140  Potassium 3.5 - 5.1 mEq/L 4.1 3.9 3.5  Chloride 101 - 111 mmol/L - 101 106  CO2 22 - 29 mEq/L 26 26 29   Calcium 8.4 - 10.4 mg/dL 9.4 9.4 8.7(L)  Total Protein 6.4 - 8.3 g/dL 6.2(L) 6.1(L) 5.5(L)  Total Bilirubin  0.20 - 1.20 mg/dL <0.30 2.4(H) 0.9  Alkaline Phos 40 - 150 U/L 92 45 41  AST 5 - 34 U/L 19 42(H) 30  ALT 0 - 55 U/L 26 64(H) 43   . Lab Results  Component Value Date   LDH 155 09/10/2015    RADIOGRAPHIC STUDIES: I have personally reviewed the radiological images as listed and agreed with the findings in the report. Ct Abdomen Pelvis W Contrast  08/26/2015  CLINICAL DATA:  Vomiting, diffuse abdominal pain, chemotherapy for lymphoma 3 days ago EXAM: CT ABDOMEN AND PELVIS WITH CONTRAST TECHNIQUE: Multidetector CT imaging of the abdomen and pelvis was performed using the standard protocol following bolus administration of intravenous contrast. CONTRAST:  111mL OMNIPAQUE IOHEXOL 300 MG/ML  SOLN COMPARISON:  08/22/2015 FINDINGS: Lung bases are unremarkable. Sagittal images of the spine are unremarkable. No calcified gallstones are noted within gallbladder. Liver, spleen, pancreas and adrenal glands are unremarkable. No aortic aneurysm. Kidneys are symmetrical in  size and enhancement. No hydronephrosis or hydroureter. Delayed renal images shows bilateral renal symmetrical excretion. Bilateral visualized proximal ureter is unremarkable. Mild dilated small bowel loops distal abdomen with some air-fluid level. There is no transition point in caliber of small bowel. Findings suspicious for mild ileus or enteritis. Abundant stool noted in right colon transverse colon. Moderate stool noted in descending colon. Small amount of free fluid noted in right paracolic gutter. No colitis or diverticulitis. No free abdominal air. Prostate gland and seminal vesicles are unremarkable. Nonspecific mild thickening of urinary bladder wall. There is no inguinal adenopathy. No destructive bony lesions are noted within pelvis. The patient is status post appendectomy. IMPRESSION: 1. Mild distended small bowel loops in lower abdomen with some air-fluid levels without transition point in caliber. Findings suspicious for ileus or enteritis. 2. Abundant stool noted in right colon and transverse colon. Moderate stool noted in descending colon. Small amount of free fluid in right paracolic gutter. 3. No hydronephrosis or hydroureter. Bilateral renal symmetrical excretion. Electronically Signed   By: Lahoma Crocker M.D.   On: 08/26/2015 17:09   Dg Abd Portable 2v  09/10/2015  CLINICAL DATA:  Nausea and vomiting today, history of lymphoma on chemotherapy, evaluate for bowel obstruction. EXAM: PORTABLE ABDOMEN - 2 VIEW COMPARISON:  Abdominal pelvic CT scan of August 26, 2015 FINDINGS: There is increased stool burden throughout the colon. There is a small amount of gas within minimally distended small bowel loops. There are no free extraluminal gas collections. There are no abnormal soft tissue calcifications. The bony structures exhibit no acute abnormalities. IMPRESSION: Increase colonic stool burden suggests clinical constipation. Mild distention of mid and distal small bowel loops is likely secondary to  the stool burden in the colon the rather than to intrinsic small bowel obstructive processes. There is no evidence of perforation. Electronically Signed   By: David  Martinique M.D.   On: 09/10/2015 15:10   Ir Epidurography  09/11/2015  CLINICAL DATA:  LP 09/06/2015 for intrathecal chemotherapy administration, Persistent postprocedure spinal positional headache despite conservative maneuvers EXAM: LUMBAR EPIDUROGRAM AND BLOOD PATCH FLUOROSCOPY TIME:  0.3 minutes, 17 uGym2 DAP COMPARISON:  LP 09/06/2015 TECHNIQUE: The skin entry site from previous lumbar puncture was localized at L5-S1. An appropriate skin entry site was determined. Operator donned sterile gloves and mask. Skin site was marked, prepped with Betadine, and draped in usual sterile fashion, and infiltrated locally with 1% lidocaine. The 20 gauge Crawford needle was advanced into the lumbar posterior epidural space near the midline using a  right interlaminar approach at L5-S1using loss of resistance technique. Diagnostic injection of 47ml Omnipaque-180 demonstrates good epidural spread above and below the needle tip and crossing the midline. No intravascular or subarachnoid component. 13ml of autologous blood were then administered as lumbar epidural blood patch. No immediate complication. IMPRESSION: 1. Technically successful lumbar epidural blood patch under fluoroscopy. The patient was counseled on the importance of laying flat for an additional 24 hours and maintaining good p.o. fluid intake. Electronically Signed   By: Lucrezia Europe M.D.   On: 09/11/2015 13:09   Dg Fluoro Guide Lumbar Puncture  09/06/2015  CLINICAL DATA:  Burkitt's lymphoma.  Intrathecal chemotherapy EXAM: FLUOROSCOPICALLY GUIDED LUMBAR PUNCTURE FOR INTRATHECAL CHEMOTHERAPY TECHNIQUE: Informed consent was obtained from the patient prior to the procedure, including potential complications of headache, allergy, and pain. A 'time out' was performed. With the patient prone, the lower back  was prepped with Betadine. 1% Lidocaine was used for local anesthesia. Lumbar puncture was initially begun at the L3-4 level, but patient experienced unusual pain with paraspinal numbing and needle placement such that this level was abandoned and the L5-S1 level with shorter skin to canal distance was selected. After numbing, LP then performed at the L5-S1 using a 20 gauge needle with return of clear CSF. 11 cc were collected for requested labs. Pharmacy prepared methotrexate and steroid was slowly injected into the subarachnoid space. The patient tolerated the procedure well without apparent complication. FLUOROSCOPY TIME:  1 minutes 4 seconds IMPRESSION: Intrathecal injection of chemotherapy without complication. Electronically Signed   By: Monte Fantasia M.D.   On: 09/06/2015 15:42   ASSESSMENT & PLAN:   Patient is a 39 yo male admitted with   1) High risk Burkitt's lymphoma stage III with no CNS involvement. 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 Patient is status post 3 cycles of EPOCH-R and received 1 dose of intrathecal methotrexate for CNS prophylaxis. His LDH level is down from 1200s to 300 to 188 to 155 PET/CT 08/22/2015 after 2 cycles of treatment. Complete metabolic response. No residual hypermetabolic lymphoma.\  2) status post spinal headaches due to CSF leak treated with epidural blood patch. Now resolved. 3) Elevated transaminases and bilirubin due to chemotherapy now resolved. 4) Emotional stress due to the rigors of his treatment regimen and significant day-to-day stress of managing a business and family life in the context of a life-threatening disease.  Plan -Patient counseled to continue focusing on his physical activity level and good oral intake . -patient was scheduled for his fourth cycle of of EPOCH R on 09/28/2015. His blood counts have bounced back to allow for this. Patient after  understanding the risks and benefits notes that he would like to delay his next cycle of chemotherapy for after Thanksgiving to allow him to emotionally and physically recover little bit more. He understands the risks involved with delaying treatment and is certain that that's what he wants to do. -we'll schedule his fourth cycle of EPOCH-R to start on 10/08/2015. --Will plan to continue CNS prophylaxis with intrathecal methotrexate on day 1 and day 5.  -Given issues with constipation aggressive Bowel prophylaxis with senna S and MiraLAX. -Given refill of oxycontin and oxycodone for pain management.-Neulasta day 7 as outpatient.  All of the patients and his fiances questions were answered to their apparent satisfaction. The patient knows to call the clinic with any problems, questions or concerns.  I spent 25 minutes counseling the  patient face to face. The total time spent in the appointment was 40 minutes and more than 50% was on counseling and direct patient cares.    Sullivan Lone MD [place] AAHIVMS [facility] Hematology/Oncology Physician [place]  (Office):       [phone] (Work cell):  [phone] (Fax):           [phone]

## 2015-10-03 ENCOUNTER — Encounter: Payer: Self-pay | Admitting: Skilled Nursing Facility1

## 2015-10-03 NOTE — Progress Notes (Signed)
Subjective:     Patient ID: Donald [name], male   DOB: 02-Feb-1976, 39 y.o.   MRN: [id number]  HPI   Review of Systems     Objective:   Physical Exam To assist the pt in identifying some dietary strategies to gain some weight back.    Assessment:     Pt identified as being malnourished due to losing some wt. Pt was contacted via the telephone at [phone]. Pt was unavailable and his voicmail stated it was Jobe Gibbon and also the catering line for [employer].    Plan:     Pt left a voicemail prompting the pt to contact [place], RD,LDN at [phone].

## 2015-10-08 ENCOUNTER — Inpatient Hospital Stay (HOSPITAL_COMMUNITY)
Admission: AD | Admit: 2015-10-08 | Discharge: 2015-10-12 | DRG: 847 | Disposition: A | Discharge status: Home / Self Care | Payer: BLUE CROSS/BLUE SHIELD | Source: Ambulatory Visit | Attending: Hematology | Admitting: Hematology

## 2015-10-08 ENCOUNTER — Inpatient Hospital Stay (HOSPITAL_COMMUNITY): Payer: BLUE CROSS/BLUE SHIELD

## 2015-10-08 ENCOUNTER — Encounter (HOSPITAL_COMMUNITY): Payer: Self-pay

## 2015-10-08 DIAGNOSIS — E860 Dehydration: Secondary | ICD-10-CM | POA: Diagnosis present

## 2015-10-08 DIAGNOSIS — R740 Nonspecific elevation of levels of transaminase and lactic acid dehydrogenase [LDH]: Secondary | ICD-10-CM | POA: Diagnosis not present

## 2015-10-08 DIAGNOSIS — C8370 Burkitt lymphoma, unspecified site: Secondary | ICD-10-CM

## 2015-10-08 DIAGNOSIS — Z733 Stress, not elsewhere classified: Secondary | ICD-10-CM | POA: Diagnosis not present

## 2015-10-08 DIAGNOSIS — K2990 Gastroduodenitis, unspecified, without bleeding: Secondary | ICD-10-CM | POA: Diagnosis present

## 2015-10-08 DIAGNOSIS — K1231 Oral mucositis (ulcerative) due to antineoplastic therapy: Secondary | ICD-10-CM

## 2015-10-08 DIAGNOSIS — K2970 Gastritis, unspecified, without bleeding: Secondary | ICD-10-CM

## 2015-10-08 DIAGNOSIS — J90 Pleural effusion, not elsewhere classified: Secondary | ICD-10-CM | POA: Diagnosis present

## 2015-10-08 DIAGNOSIS — Z8249 Family history of ischemic heart disease and other diseases of the circulatory system: Secondary | ICD-10-CM | POA: Diagnosis not present

## 2015-10-08 DIAGNOSIS — R109 Unspecified abdominal pain: Secondary | ICD-10-CM | POA: Diagnosis not present

## 2015-10-08 DIAGNOSIS — Z881 Allergy status to other antibiotic agents status: Secondary | ICD-10-CM | POA: Diagnosis not present

## 2015-10-08 DIAGNOSIS — T451X5A Adverse effect of antineoplastic and immunosuppressive drugs, initial encounter: Secondary | ICD-10-CM | POA: Diagnosis not present

## 2015-10-08 DIAGNOSIS — Z882 Allergy status to sulfonamides status: Secondary | ICD-10-CM

## 2015-10-08 DIAGNOSIS — Z5111 Encounter for antineoplastic chemotherapy: Principal | ICD-10-CM

## 2015-10-08 DIAGNOSIS — B370 Candidal stomatitis: Secondary | ICD-10-CM

## 2015-10-08 DIAGNOSIS — K5900 Constipation, unspecified: Secondary | ICD-10-CM | POA: Diagnosis not present

## 2015-10-08 DIAGNOSIS — T402X5A Adverse effect of other opioids, initial encounter: Secondary | ICD-10-CM | POA: Diagnosis not present

## 2015-10-08 DIAGNOSIS — C8378 Burkitt lymphoma, lymph nodes of multiple sites: Secondary | ICD-10-CM | POA: Diagnosis not present

## 2015-10-08 LAB — CBC WITH DIFFERENTIAL/PLATELET
BASOS ABS: 0.1 10*3/uL (ref 0.0–0.1)
Basophils Relative: 2 %
Eosinophils Absolute: 0 10*3/uL (ref 0.0–0.7)
Eosinophils Relative: 1 %
HEMATOCRIT: 31.8 % — AB (ref 39.0–52.0)
HEMOGLOBIN: 10.2 g/dL — AB (ref 13.0–17.0)
LYMPHS PCT: 23 %
Lymphs Abs: 1 10*3/uL (ref 0.7–4.0)
MCH: 29.3 pg (ref 26.0–34.0)
MCHC: 32.1 g/dL (ref 30.0–36.0)
MCV: 91.4 fL (ref 78.0–100.0)
MONO ABS: 0.7 10*3/uL (ref 0.1–1.0)
Monocytes Relative: 15 %
NEUTROS ABS: 2.7 10*3/uL (ref 1.7–7.7)
NEUTROS PCT: 59 %
Platelets: 223 10*3/uL (ref 150–400)
RBC: 3.48 MIL/uL — AB (ref 4.22–5.81)
RDW: 16.2 % — ABNORMAL HIGH (ref 11.5–15.5)
WBC: 4.5 10*3/uL (ref 4.0–10.5)

## 2015-10-08 LAB — CSF CELL COUNT WITH DIFFERENTIAL
RBC Count, CSF: 2 /mm3 — ABNORMAL HIGH
Tube #: 1
WBC, CSF: 2 /mm3 (ref 0–5)

## 2015-10-08 LAB — COMPREHENSIVE METABOLIC PANEL
ALK PHOS: 52 U/L (ref 38–126)
ALT: 21 U/L (ref 17–63)
AST: 24 U/L (ref 15–41)
Albumin: 3.6 g/dL (ref 3.5–5.0)
Anion gap: 7 (ref 5–15)
BILIRUBIN TOTAL: 0.5 mg/dL (ref 0.3–1.2)
BUN: 12 mg/dL (ref 6–20)
CO2: 30 mmol/L (ref 22–32)
CREATININE: 0.65 mg/dL (ref 0.61–1.24)
Calcium: 8.7 mg/dL — ABNORMAL LOW (ref 8.9–10.3)
Chloride: 105 mmol/L (ref 101–111)
GFR calc Af Amer: >60 mL/min (ref >60)
GFR calc non Af Amer: >60 mL/min (ref >60)
Glucose, Bld: 74 mg/dL (ref 65–99)
Potassium: 3.6 mmol/L (ref 3.5–5.1)
Sodium: 142 mmol/L (ref 135–145)
TOTAL PROTEIN: 5.9 g/dL — AB (ref 6.5–8.1)

## 2015-10-08 LAB — PROTEIN, CSF: TOTAL PROTEIN, CSF: 20 mg/dL (ref 15–45)

## 2015-10-08 LAB — URIC ACID: URIC ACID, SERUM: 5.1 mg/dL (ref 4.4–7.6)

## 2015-10-08 LAB — LACTATE DEHYDROGENASE: LDH: 146 U/L (ref 98–192)

## 2015-10-08 MED ORDER — OXYCODONE HCL ER 10 MG PO T12A
10.0000 mg | EXTENDED_RELEASE_TABLET | Freq: Two times a day (BID) | ORAL | Status: DC
  Administered 2015-10-09 – 2015-10-11 (×3): 10 mg via ORAL
  Filled 2015-10-08 (×4): qty 1

## 2015-10-08 MED ORDER — SODIUM CHLORIDE 0.9 % IJ SOLN
Freq: Once | INTRAMUSCULAR | Status: AC
  Administered 2015-10-08: 13:00:00 via INTRATHECAL
  Filled 2015-10-08: qty 0.48

## 2015-10-08 MED ORDER — SODIUM CHLORIDE 0.9 % IJ SOLN: 10.0000 mL | INTRAMUSCULAR | Status: DC | PRN

## 2015-10-08 MED ORDER — ONDANSETRON HCL 4 MG/2ML IJ SOLN
4.0000 mg | Freq: Three times a day (TID) | INTRAMUSCULAR | Status: DC | PRN
Start: 2015-10-08 — End: 2015-10-12
  Filled 2015-10-08: qty 2

## 2015-10-08 MED ORDER — SODIUM CHLORIDE 0.9 % IV SOLN
8.0000 mg | Freq: Three times a day (TID) | INTRAVENOUS | Status: DC | PRN
Start: 2015-10-08 — End: 2015-10-12
  Filled 2015-10-08: qty 4

## 2015-10-08 MED ORDER — PANTOPRAZOLE SODIUM 40 MG IV SOLR
40.0000 mg | INTRAVENOUS | Status: DC
  Administered 2015-10-09 – 2015-10-11 (×3): 40 mg via INTRAVENOUS
  Filled 2015-10-08 (×3): qty 40

## 2015-10-08 MED ORDER — VINCRISTINE SULFATE CHEMO INJECTION 1 MG/ML
Freq: Once | INTRAVENOUS | Status: AC
  Administered 2015-10-08: 13:00:00 via INTRAVENOUS
  Filled 2015-10-08: qty 11

## 2015-10-08 MED ORDER — DEXAMETHASONE SODIUM PHOSPHATE 100 MG/10ML IJ SOLN
Freq: Once | INTRAMUSCULAR | Status: AC
  Administered 2015-10-08: 11:00:00 via INTRAVENOUS
  Filled 2015-10-08: qty 4

## 2015-10-08 MED ORDER — SODIUM CHLORIDE 0.9 % IJ SOLN: 3.0000 mL | INTRAMUSCULAR | Status: DC | PRN

## 2015-10-08 MED ORDER — POLYETHYLENE GLYCOL 3350 17 G PO PACK
17.0000 g | PACK | Freq: Every day | ORAL | Status: DC
  Administered 2015-10-09 – 2015-10-11 (×3): 17 g via ORAL
  Filled 2015-10-08 (×4): qty 1

## 2015-10-08 MED ORDER — SENNOSIDES-DOCUSATE SODIUM 8.6-50 MG PO TABS: 1.0000 | ORAL_TABLET | Freq: Every evening | ORAL | Status: DC | PRN

## 2015-10-08 MED ORDER — HEPARIN SOD (PORK) LOCK FLUSH 100 UNIT/ML IV SOLN: 250.0000 [IU] | Freq: Once | INTRAVENOUS | Status: DC | PRN

## 2015-10-08 MED ORDER — COLD PACK MISC ONCOLOGY
1.0000 | Freq: Once | Status: DC | PRN
  Filled 2015-10-08: qty 1

## 2015-10-08 MED ORDER — ALUM & MAG HYDROXIDE-SIMETH 200-200-20 MG/5ML PO SUSP: 60.0000 mL | ORAL | Status: DC | PRN

## 2015-10-08 MED ORDER — SENNOSIDES-DOCUSATE SODIUM 8.6-50 MG PO TABS
2.0000 | ORAL_TABLET | Freq: Two times a day (BID) | ORAL | Status: DC
  Administered 2015-10-09 (×3): 2 via ORAL
  Filled 2015-10-08 (×6): qty 2

## 2015-10-08 MED ORDER — ONDANSETRON 4 MG PO TBDP: 4.0000 mg | ORAL_TABLET | Freq: Three times a day (TID) | ORAL | Status: DC | PRN

## 2015-10-08 MED ORDER — HOT PACK MISC ONCOLOGY
1.0000 | Freq: Once | Status: DC | PRN
  Filled 2015-10-08: qty 1

## 2015-10-08 MED ORDER — PREDNISONE 50 MG PO TABS
100.0000 mg | ORAL_TABLET | Freq: Every day | ORAL | Status: DC
  Administered 2015-10-08 – 2015-10-11 (×4): 100 mg via ORAL
  Filled 2015-10-08 (×5): qty 2

## 2015-10-08 MED ORDER — ACETAMINOPHEN 325 MG PO TABS: 650.0000 mg | ORAL_TABLET | ORAL | Status: DC | PRN

## 2015-10-08 MED ORDER — SODIUM CHLORIDE 0.9 % IV SOLN
INTRAVENOUS | Status: DC
  Administered 2015-10-08: 13:00:00 via INTRAVENOUS
  Administered 2015-10-10: 20 mL/h via INTRAVENOUS

## 2015-10-08 MED ORDER — OXYCODONE HCL 5 MG PO TABS: 5.0000 mg | ORAL_TABLET | Freq: Four times a day (QID) | ORAL | Status: DC | PRN

## 2015-10-08 MED ORDER — ALTEPLASE 2 MG IJ SOLR
2.0000 mg | Freq: Once | INTRAMUSCULAR | Status: DC | PRN
  Filled 2015-10-08: qty 2

## 2015-10-08 MED ORDER — ONDANSETRON HCL 4 MG PO TABS
4.0000 mg | ORAL_TABLET | Freq: Three times a day (TID) | ORAL | Status: DC | PRN
  Administered 2015-10-10: 4 mg via ORAL
  Filled 2015-10-08: qty 1

## 2015-10-08 MED ORDER — SODIUM BICARBONATE/SODIUM CHLORIDE MOUTHWASH
1.0000 "application " | Freq: Four times a day (QID) | OROMUCOSAL | Status: DC
  Administered 2015-10-09 – 2015-10-12 (×9): 1 via OROMUCOSAL
  Filled 2015-10-08: qty 1000

## 2015-10-08 MED ORDER — HEPARIN SOD (PORK) LOCK FLUSH 100 UNIT/ML IV SOLN: 500.0000 [IU] | Freq: Once | INTRAVENOUS | Status: DC | PRN

## 2015-10-08 NOTE — H&P (Signed)
Donald [name] Kitchen    HEMATOLOGY/ONCOLOGY CONSULTATION NOTE  Date of Service: 10/08/2015  Patient Care Team: Donald [name], DO as PCP - General (Internal Medicine)  CHIEF COMPLAINTS/PURPOSE OF CONSULTATION:  Admission for 4th cycle of EPOCH-R chemotherapy and CNS prophylaxis with IT methotrexate.  HISTORY OF PRESENTING ILLNESS:   Donald [name] is a wonderful 39 y.o. male who has high-risk Burkitt's lymphoma without CNS involvement about . He presented with a large right-sided pleural effusion and abdominal adenopathy, omental nodules involvement. Lumbar puncture was negative. The bone marrow was negative.  He has completed 3 cycles of EPOCH-R which he tolerated well without any infectious issues or other prohibitive toxicities. His PET/CT after 2 cycles showed excellent response. He has had some issues with treatment related constipation which has resolved with laxatives. He has had some intermittent abdominal discomfort with food which has a times been bothersome.  He started CNS prophylaxis with IT methotrexate from cycle has received 1 dose. He had some issues with nausea and had a persistent spinal headache from a CSF leak requiring epidural blood patch.  Patient is here for cycle 4 of his EPOCH-R regimen and notes that he generally been doing well. No further headaches, nausea or vomiting. Some intermittent abdominal discomfort with larger meals. Has gained back his lost weight. Notes some psycho-social stressors as expected and due to the uncertainty about whether his lymphoma will come back. His fiance Donald [name] was present with him to provide support. No other acute new symptoms.  MEDICAL HISTORY:  Past Medical History  Diagnosis Date  . EBV infection 2013    Patient notes that he had an EBV infection in 2013 that left him with chronic fatigue. She also notes that he had significant lymphadenopathy and thyroiditis at the time. He has been managing his symptoms with an alternative medicine  practitioner in [place] and with other alternative medicines.  . Marijuana use, episodic   . Complication of anesthesia   . Burkitt lymphoma ([place]) 08/15/2015    SURGICAL HISTORY: Past Surgical History  Procedure Laterality Date  . Tonsillectomy    . Appendectomy    . Laparoscopy N/A 07/20/2015    Procedure: LAPAROSCOPY DIAGNOSTIC, MULTIPLE BIOPSIES, DRAINAGE OF ASCITES;  Surgeon: Donald Skates, MD;  Location: [facility];  Service: General;  Laterality: N/A;    SOCIAL HISTORY: Social History   Social History  . Marital Status: Married    Spouse Name: N/A  . Number of Children: N/A  . Years of Education: N/A   Occupational History  . Not on file.   Social History Main Topics  . Smoking status: Never Smoker   . Smokeless tobacco: Never Used  . Alcohol Use: No  . Drug Use: 1.00 per week    Special: Marijuana  . Sexual Activity: Yes   Other Topics Concern  . 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   He has a fiance and has been in a steady relationship.      He has tried to maintain a very healthy lifestyle and cycles about 100 miles a week. He also uses a fair number of over-the-counter alternative medicines to stay healthy.    FAMILY HISTORY: Family History  Problem Relation Age of Onset  . Heart failure Father     ALLERGIES:  is allergic to levaquin and sulfa antibiotics.  MEDICATIONS:  Current Facility-Administered Medications  Medication Dose Route Frequency Provider Last Rate  Last Dose  . 0.9 %  sodium chloride infusion   Intravenous Continuous Donald Genera, MD      . alteplase (CATHFLO ACTIVASE) injection 2 mg  2 mg Intracatheter Once PRN Donald Genera, MD      . Cold Pack 1 packet  1 packet Topical Once PRN Donald Genera, MD      . DOXOrubicin (ADRIAMYCIN) 22 mg, etoposide (VEPESID) 106 mg, vinCRIStine (ONCOVIN) 0.9 mg in sodium chloride  0.9 % 500 mL chemo infusion   Intravenous Once Donald Genera, MD      . heparin lock flush 100 unit/mL  500 Units Intracatheter Once PRN Donald Genera, MD      . heparin lock flush 100 unit/mL  250 Units Intracatheter Once PRN Donald Genera, MD      . Hot Pack 1 packet  1 packet Topical Once PRN Donald Genera, MD      . predniSONE (DELTASONE) tablet 100 mg  100 mg Oral QAC breakfast Donald Genera, MD      . sodium chloride 0.9 % injection 10 mL  10 mL Intracatheter PRN Donald Genera, MD      . sodium chloride 0.9 % injection 3 mL  3 mL Intravenous PRN Donald Slick Juleen China, MD        REVIEW OF SYSTEMS:    10 Point review of Systems was done is negative except as noted above.  PHYSICAL EXAMINATION: ECOG PERFORMANCE STATUS: 1 - Symptomatic but completely ambulatory  . Filed Vitals:   10/08/15 1010  BP: 121/48  Pulse: 56  Temp: 98.4 F (36.9 C)  Resp: 18   Filed Weights   10/08/15 1010  Weight: 176 lb 6.4 oz (80.015 kg)   .Body mass index is 24.61 kg/(m^2).  GENERAL:alert, in no acute distress and comfortable SKIN: skin color, texture, turgor are normal, no rashes or significant lesions EYES: normal, conjunctiva are pink and non-injected, sclera clear OROPHARYNX:no exudate, no erythema and lips, buccal mucosa, and tongue normal  NECK: supple, no JVD, thyroid normal size, non-tender, without nodularity LYMPH:  no palpable lymphadenopathy in the cervical, axillary or inguinal LUNGS: clear to auscultation with normal respiratory effort HEART: regular rate & rhythm,  no murmurs and no lower extremity edema ABDOMEN: abdomen soft, non-tender, normoactive bowel sounds  Musculoskeletal: no cyanosis of digits and no clubbing  PSYCH: alert & oriented x 3 with fluent speech NEURO: no focal motor/sensory deficits  LABORATORY DATA:  I have reviewed the data as listed  . CBC Latest Ref Rng 10/08/2015 09/21/2015 09/12/2015  WBC 4.0 - 10.5 K/uL 4.5  24.0(H) 6.4  Hemoglobin 13.0 - 17.0 g/dL 10.2(L) 10.7(L) 9.5(L)  Hematocrit 39.0 - 52.0 % 31.8(L) 32.6(L) 26.7(L)  Platelets 150 - 400 K/uL 223 185 262    . CMP Latest Ref Rng 10/08/2015 09/21/2015 09/11/2015  Glucose 65 - 99 mg/dL 74 92 136(H)  BUN 6 - 20 mg/dL 12 6.9(L) 22(H)  Creatinine 0.61 - 1.24 mg/dL 0.65 0.8 0.64  Sodium 135 - 145 mmol/L 142 140 137  Potassium 3.5 - 5.1 mmol/L 3.6 4.1 3.9  Chloride 101 - 111 mmol/L 105 - 101  CO2 22 - 32 mmol/L 30 26 26   Calcium 8.9 - 10.3 mg/dL 8.7(L) 9.4 9.4  Total Protein 6.5 - 8.1 g/dL 5.9(L) 6.2(L) 6.1(L)  Total Bilirubin 0.3 - 1.2 mg/dL 0.5 <0.30 2.4(H)  Alkaline Phos 38 - 126 U/L 52 92 45  AST 15 - 41 U/L 24 19  42(H)  ALT 17 - 63 U/L 21 26 64(H)   . Lab Results  Component Value Date   LDH 146 10/08/2015   Uric acid 5.1   RADIOGRAPHIC STUDIES: I have personally reviewed the radiological images as listed and agreed with the findings in the report. Dg Abd Portable 2v  09/10/2015  CLINICAL DATA:  Nausea and vomiting today, history of lymphoma on chemotherapy, evaluate for bowel obstruction. EXAM: PORTABLE ABDOMEN - 2 VIEW COMPARISON:  Abdominal pelvic CT scan of August 26, 2015 FINDINGS: There is increased stool burden throughout the colon. There is a small amount of gas within minimally distended small bowel loops. There are no free extraluminal gas collections. There are no abnormal soft tissue calcifications. The bony structures exhibit no acute abnormalities. IMPRESSION: Increase colonic stool burden suggests clinical constipation. Mild distention of mid and distal small bowel loops is likely secondary to the stool burden in the colon the rather than to intrinsic small bowel obstructive processes. There is no evidence of perforation. Electronically Signed   By: David  Martinique M.D.   On: 09/10/2015 15:10   Ir Epidurography  09/11/2015  CLINICAL DATA:  LP 09/06/2015 for intrathecal chemotherapy administration, Persistent postprocedure  spinal positional headache despite conservative maneuvers EXAM: LUMBAR EPIDUROGRAM AND BLOOD PATCH FLUOROSCOPY TIME:  0.3 minutes, 17 uGym2 DAP COMPARISON:  LP 09/06/2015 TECHNIQUE: The skin entry site from previous lumbar puncture was localized at L5-S1. An appropriate skin entry site was determined. Operator donned sterile gloves and mask. Skin site was marked, prepped with Betadine, and draped in usual sterile fashion, and infiltrated locally with 1% lidocaine. The 20 gauge Crawford needle was advanced into the lumbar posterior epidural space near the midline using a right interlaminar approach at L5-S1using loss of resistance technique. Diagnostic injection of 4ml Omnipaque-180 demonstrates good epidural spread above and below the needle tip and crossing the midline. No intravascular or subarachnoid component. 41ml of autologous blood were then administered as lumbar epidural blood patch. No immediate complication. IMPRESSION: 1. Technically successful lumbar epidural blood patch under fluoroscopy. The patient was counseled on the importance of laying flat for an additional 24 hours and maintaining good p.o. fluid intake. Electronically Signed   By: Lucrezia Europe M.D.   On: 09/11/2015 13:09    ASSESSMENT & PLAN:   Patient is a 39 yo male admitted with   1) High risk Burkitt's lymphoma stage III with no CNS involvement. Cytogenetics showed Myc Break apart event associated with chromosomal variants of Burkitt's lymphoma t(2;8) and t(8;22). Patient is status post 3 cycles of EPOCH-R and received 1 dose of intrathecal methotrexate for CNS prophylaxis. His LDH level is down from 1200s to 300 to 188 to 155. Stable today at 146 PET/CT 08/22/2015 after 2 cycles of treatment. Complete metabolic response. No residual hypermetabolic lymphoma. No clinical or biochemical evidence of disease progression.  2) status post spinal headaches due to CSF leak treated with epidural blood patch. Now resolved. 3) Elevated  transaminases and bilirubin due to chemotherapy now resolved. 4) Emotional stress due to the rigors of his treatment regimen and significant day-to-day stress of managing a business and family life in the context of a life-threatening disease. 5) Intermittent abdominal discomfort with food intake ?  Plan -Patient stable and labs acceptable to proceed with 4th cycle of EPOCH-R --Will plan to continue CNS prophylaxis with intrathecal methotrexate on day 1 and if the patient tolerates it on day 5.  -Given issues with constipation aggressive Bowel prophylaxis with senna S  and MiraLAX. -oxycontin and prn oxycodone for pain management. -Neulasta day 7 as outpatient. -PPI for GI prophylaxis with high dose steroids. -if persistent GI discomfort with po intake will consider UGI series -regular diet with smaller more frequent meals. -counseled patient regarding stress reduction and provided supportive counselling -will continue to f/u daily    All of the patients questions were answered with apparent satisfaction. The patient knows to call the clinic with any problems, questions or concerns.  I spent 55 minutes counseling the patient face to face. The total time spent in the appointment was 60 minutes and more than 50% was on counseling and direct patient cares.    Sullivan Lone MD [place] AAHIVMS [facility] Hematology/Oncology Physician [place]  (Office):       [phone] (Work cell):  [phone] (Fax):           [phone]  10/08/2015 1:09 PM

## 2015-10-08 NOTE — Progress Notes (Signed)
Patient had methotrexate intrathecal injection today,patient was told to stay flat for at least 4 hrs,patient  was seen raising his head up every now and then while doing computer work., but did not get up from bed, used urinal,

## 2015-10-08 NOTE — Progress Notes (Signed)
Manual calculation of BSA and dosing for doxorubicin, etoposide, and vincristine completed.  Secondary verification by Elsie Macabuag, RN 

## 2015-10-08 NOTE — Progress Notes (Signed)
Manual calculation of BSA and dosing for intrathecal methotrexate completed.  Secondary verification by Reyne Dumas, RN.

## 2015-10-09 LAB — COMPREHENSIVE METABOLIC PANEL
ALT: 21 U/L (ref 17–63)
AST: 23 U/L (ref 15–41)
Albumin: 3.6 g/dL (ref 3.5–5.0)
Alkaline Phosphatase: 53 U/L (ref 38–126)
Anion gap: 8 (ref 5–15)
BUN: 16 mg/dL (ref 6–20)
CHLORIDE: 105 mmol/L (ref 101–111)
CO2: 24 mmol/L (ref 22–32)
Calcium: 8.8 mg/dL — ABNORMAL LOW (ref 8.9–10.3)
Creatinine, Ser: 0.72 mg/dL (ref 0.61–1.24)
Glucose, Bld: 164 mg/dL — ABNORMAL HIGH (ref 65–99)
POTASSIUM: 4.1 mmol/L (ref 3.5–5.1)
Sodium: 137 mmol/L (ref 135–145)
Total Bilirubin: 0.5 mg/dL (ref 0.3–1.2)
Total Protein: 5.9 g/dL — ABNORMAL LOW (ref 6.5–8.1)

## 2015-10-09 LAB — CBC WITH DIFFERENTIAL/PLATELET
BASOS ABS: 0 10*3/uL (ref 0.0–0.1)
Basophils Relative: 0 %
EOS ABS: 0 10*3/uL (ref 0.0–0.7)
EOS PCT: 0 %
HCT: 32.7 % — ABNORMAL LOW (ref 39.0–52.0)
Hemoglobin: 10.6 g/dL — ABNORMAL LOW (ref 13.0–17.0)
LYMPHS PCT: 6 %
Lymphs Abs: 0.5 10*3/uL — ABNORMAL LOW (ref 0.7–4.0)
MCH: 29.6 pg (ref 26.0–34.0)
MCHC: 32.4 g/dL (ref 30.0–36.0)
MCV: 91.3 fL (ref 78.0–100.0)
MONO ABS: 0 10*3/uL — AB (ref 0.1–1.0)
Monocytes Relative: 1 %
Neutro Abs: 7.2 10*3/uL (ref 1.7–7.7)
Neutrophils Relative %: 93 %
PLATELETS: 258 10*3/uL (ref 150–400)
RBC: 3.58 MIL/uL — ABNORMAL LOW (ref 4.22–5.81)
RDW: 15.9 % — AB (ref 11.5–15.5)
WBC: 7.7 10*3/uL (ref 4.0–10.5)

## 2015-10-09 LAB — AMYLASE: AMYLASE: 51 U/L (ref 28–100)

## 2015-10-09 LAB — URIC ACID: URIC ACID, SERUM: 4.9 mg/dL (ref 4.4–7.6)

## 2015-10-09 LAB — LIPASE, BLOOD: LIPASE: 26 U/L (ref 11–51)

## 2015-10-09 MED ORDER — SODIUM CHLORIDE 0.9 % IV SOLN
Freq: Once | INTRAVENOUS | Status: AC
  Administered 2015-10-09: 11:00:00 via INTRAVENOUS
  Filled 2015-10-09: qty 4

## 2015-10-09 MED ORDER — ACETAMINOPHEN 500 MG PO TABS
1000.0000 mg | ORAL_TABLET | Freq: Four times a day (QID) | ORAL | Status: DC | PRN
Start: 2015-10-09 — End: 2015-10-12

## 2015-10-09 MED ORDER — VINCRISTINE SULFATE CHEMO INJECTION 1 MG/ML
Freq: Once | INTRAVENOUS | Status: AC
  Administered 2015-10-09: 12:00:00 via INTRAVENOUS
  Filled 2015-10-09: qty 11

## 2015-10-09 NOTE — Progress Notes (Signed)
Manual calculation of BSA and dosing for doxorubicin, etoposide, and vincristine completed.  Secondary verification by Elsie Macabuag, RN 

## 2015-10-09 NOTE — Progress Notes (Signed)
[name]   HEMATOLOGY/ONCOLOGY INPATIENT PROGRESS NOTE  Date of Service: 10/09/2015  Inpatient Attending: .Brunetta Genera, MD   SUBJECTIVE  Mr Rodriges appears a little more relaxed today. No other acute new symptoms. No significant headache. No nausea/vomiting. Sitting up working on his computer. Still contemplating if he would consider Day 5 IT methotrexate.    OBJECTIVE:    PHYSICAL EXAMINATION: . Filed Vitals:   10/08/15 1010 10/09/15 0007 10/09/15 1210 10/09/15 2229  BP: 121/48 111/59 121/59 104/54  Pulse: 56 64 63 54  Temp: 98.4 F (36.9 C)  98.2 F (36.8 C) 98.4 F (36.9 C)  TempSrc: Oral  Oral Oral  Resp: 18 16 16 18   Height: 5\' 11"  (1.803 m)     Weight: 176 lb 6.4 oz (80.015 kg)     SpO2: 100% 98% 99% 98%   Filed Weights   10/08/15 1010  Weight: 176 lb 6.4 oz (80.015 kg)   .Body mass index is 24.61 kg/(m^2).  GENERAL:alert, in no acute distress and comfortable SKIN: skin color, texture, turgor are normal, no rashes or significant lesions EYES: normal, conjunctiva are pink and non-injected, sclera clear OROPHARYNX:no exudate, no erythema and lips, buccal mucosa, and tongue normal  NECK: supple, no JVD, thyroid normal size, non-tender, without nodularity LYMPH:  no palpable lymphadenopathy in the cervical, axillary or inguinal LUNGS: clear to auscultation with normal respiratory effort HEART: regular rate & rhythm,  no murmurs and no lower extremity edema ABDOMEN: abdomen soft, non-tender, normoactive bowel sounds  Musculoskeletal: no cyanosis of digits and no clubbing  PSYCH: alert & oriented x 3 with fluent speech NEURO: no focal motor/sensory deficits  MEDICAL HISTORY:  Past Medical History  Diagnosis Date  . EBV infection 2013    Patient notes that he had an EBV infection in 2013 that left him with chronic fatigue. She also notes that he had significant lymphadenopathy and thyroiditis at the time. He has been managing his symptoms with an alternative  medicine practitioner in [place] and with other alternative medicines.  . Marijuana use, episodic   . Complication of anesthesia   . Burkitt lymphoma ([place]) 08/15/2015    SURGICAL HISTORY: Past Surgical History  Procedure Laterality Date  . Tonsillectomy    . Appendectomy    . Laparoscopy N/A 07/20/2015    Procedure: LAPAROSCOPY DIAGNOSTIC, MULTIPLE BIOPSIES, DRAINAGE OF ASCITES;  Surgeon: Fanny Skates, MD;  Location: [facility];  Service: General;  Laterality: N/A;    SOCIAL HISTORY: Social History   Social History  . Marital Status: Married    Spouse Name: N/A  . Number of Children: N/A  . Years of Education: N/A   Occupational History  . Not on file.   Social History Main Topics  . Smoking status: Never Smoker   . Smokeless tobacco: Never Used  . Alcohol Use: No  . Drug Use: 1.00 per week    Special: Marijuana  . Sexual Activity: Yes   Other Topics Concern  . 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   He has a fiance and has been in a steady relationship.      He has tried to maintain a very healthy lifestyle and cycles about 100 miles a week. He also uses a fair number of over-the-counter alternative medicines to stay healthy.    FAMILY HISTORY: Family History  Problem Relation Age of Onset  . Heart failure Father  ALLERGIES:  is allergic to levaquin and sulfa antibiotics.  MEDICATIONS:  Scheduled Meds: . DOXOrubicin/vinCRIStine/etoposide CHEMO IV infusion for Inpatient CI   Intravenous Once  . oxyCODONE  10 mg Oral Q12H  . pantoprazole (PROTONIX) IV  40 mg Intravenous Q24H  . polyethylene glycol  17 g Oral Daily  . predniSONE  100 mg Oral QAC breakfast  . senna-docusate  2 tablet Oral BID  . sodium bicarbonate/sodium chloride  1 application Mouth Rinse QID   Continuous Infusions: . sodium chloride 20 mL/hr at 10/08/15 1314   PRN  Meds:.acetaminophen, alteplase, alum & mag hydroxide-simeth, Cold Pack, heparin lock flush, heparin lock flush, Hot Pack, ondansetron **OR** ondansetron **OR** ondansetron (ZOFRAN) IV **OR** ondansetron (ZOFRAN) IV, oxyCODONE, senna-docusate, sodium chloride, sodium chloride  REVIEW OF SYSTEMS:    10 Point review of Systems was done is negative except as noted above.   LABORATORY DATA:  I have reviewed the data as listed  . CBC Latest Ref Rng 10/09/2015 10/08/2015 09/21/2015  WBC 4.0 - 10.5 K/uL 7.7 4.5 24.0(H)  Hemoglobin 13.0 - 17.0 g/dL 10.6(L) 10.2(L) 10.7(L)  Hematocrit 39.0 - 52.0 % 32.7(L) 31.8(L) 32.6(L)  Platelets 150 - 400 K/uL 258 223 185    . CMP Latest Ref Rng 10/09/2015 10/08/2015 09/21/2015  Glucose 65 - 99 mg/dL 164(H) 74 92  BUN 6 - 20 mg/dL 16 12 6.9(L)  Creatinine 0.61 - 1.24 mg/dL 0.72 0.65 0.8  Sodium 135 - 145 mmol/L 137 142 140  Potassium 3.5 - 5.1 mmol/L 4.1 3.6 4.1  Chloride 101 - 111 mmol/L 105 105 -  CO2 22 - 32 mmol/L 24 30 26   Calcium 8.9 - 10.3 mg/dL 8.8(L) 8.7(L) 9.4  Total Protein 6.5 - 8.1 g/dL 5.9(L) 5.9(L) 6.2(L)  Total Bilirubin 0.3 - 1.2 mg/dL 0.5 0.5 <0.30  Alkaline Phos 38 - 126 U/L 53 52 92  AST 15 - 41 U/L 23 24 19   ALT 17 - 63 U/L 21 21 26    . Lab Results  Component Value Date   LDH 146 10/08/2015    Component     Latest Ref Rng 10/09/2015  Uric Acid, Serum     4.4 - 7.6 mg/dL 4.9  Lipase     11 - 51 U/L 26  Amylase     28 - 100 U/L 51    RADIOGRAPHIC STUDIES: I have personally reviewed the radiological images as listed and agreed with the findings in the report. Dg Abd Portable 2v  09/10/2015  CLINICAL DATA:  Nausea and vomiting today, history of lymphoma on chemotherapy, evaluate for bowel obstruction. EXAM: PORTABLE ABDOMEN - 2 VIEW COMPARISON:  Abdominal pelvic CT scan of August 26, 2015 FINDINGS: There is increased stool burden throughout the colon. There is a small amount of gas within minimally distended small  bowel loops. There are no free extraluminal gas collections. There are no abnormal soft tissue calcifications. The bony structures exhibit no acute abnormalities. IMPRESSION: Increase colonic stool burden suggests clinical constipation. Mild distention of mid and distal small bowel loops is likely secondary to the stool burden in the colon the rather than to intrinsic small bowel obstructive processes. There is no evidence of perforation. Electronically Signed   By: David  Martinique M.D.   On: 09/10/2015 15:10   Ir Epidurography  09/11/2015  CLINICAL DATA:  LP 09/06/2015 for intrathecal chemotherapy administration, Persistent postprocedure spinal positional headache despite conservative maneuvers EXAM: LUMBAR EPIDUROGRAM AND BLOOD PATCH FLUOROSCOPY TIME:  0.3 minutes, 17 uGym2 DAP COMPARISON:  LP 09/06/2015 TECHNIQUE: The skin entry site from previous lumbar puncture was localized at L5-S1. An appropriate skin entry site was determined. Operator donned sterile gloves and mask. Skin site was marked, prepped with Betadine, and draped in usual sterile fashion, and infiltrated locally with 1% lidocaine. The 20 gauge Crawford needle was advanced into the lumbar posterior epidural space near the midline using a right interlaminar approach at L5-S1using loss of resistance technique. Diagnostic injection of 69ml Omnipaque-180 demonstrates good epidural spread above and below the needle tip and crossing the midline. No intravascular or subarachnoid component. 17ml of autologous blood were then administered as lumbar epidural blood patch. No immediate complication. IMPRESSION: 1. Technically successful lumbar epidural blood patch under fluoroscopy. The patient was counseled on the importance of laying flat for an additional 24 hours and maintaining good p.o. fluid intake. Electronically Signed   By: Lucrezia Europe M.D.   On: 09/11/2015 13:09   Dg Fluoro Guide Lumbar Puncture  10/08/2015  CLINICAL DATA:  Lymphoma. EXAM:  FLUOROSCOPICALLY GUIDED LUMBAR PUNCTURE FOR INTRATHECAL CHEMOTHERAPY TECHNIQUE: Informed consent was obtained from the patient prior to the procedure, including potential complications of headache, allergy, and pain. A 'time out' was performed. With the patient prone, the lower back was prepped with Betadine. 1% Lidocaine was used for local anesthesia. Lumbar puncture was performed at the L2-3 using a 20 gauge needle with return of clear CSF. Opening pressure 20 cm H2O. 20 mg Of methotrexate was injected into the subarachnoid space. The patient tolerated the procedure well without apparent complication. FLUOROSCOPY TIME:  Fluoroscopy Time (in minutes and seconds): 0 minutes 26 seconds IMPRESSION: Intrathecal injection of chemotherapy without complication Electronically Signed   By: Franchot Gallo M.D.   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 Cytogenetics showed Myc Break apart event associated with chromosomal variants of Burkitt's lymphoma t(2;8) and t(8;22). Patient is status post 3 cycles of EPOCH-R and received 1 dose of intrathecal methotrexate for CNS prophylaxis. His LDH level is down from 1200s to 300 to 188 to 155. Stable today at 146 PET/CT 08/22/2015 after 2 cycles of treatment. Complete metabolic response. No residual hypermetabolic lymphoma. No clinical or biochemical evidence of disease progression.  2) status post spinal headaches due to CSF leak treated with epidural blood patch. Now resolved. 3) Elevated transaminases and bilirubin due to chemotherapy now resolved. 4) Emotional stress due to the rigors of his treatment regimen and significant day-to-day stress of managing a business and family life in the context of a life-threatening disease. 5) Intermittent abdominal discomfort with food intake ?  Plan -tolerating treatment without any acute new concerns. C4 Day 2  EPOCH-R today --patient contemplating whether he would like to proceed with Day 5 IT methotrexate Continue current pain mx and bowel prophylaxis -Neulasta day 7 as outpatient. -PPI for GI prophylaxis with high dose steroids. -regular diet with smaller more frequent meals. -will continue to f/u daily  I spent 20 minutes counseling the patient face to face. The total time spent in the appointment was 25 minutes and more than 50% was on counseling and direct patient cares.    Sullivan Lone MD [place] AAHIVMS [facility] Hematology/Oncology Physician [facility]  (Office):       [phone] (Work cell):  [phone] (Fax):           [phone]

## 2015-10-10 ENCOUNTER — Inpatient Hospital Stay (HOSPITAL_COMMUNITY): Payer: BLUE CROSS/BLUE SHIELD

## 2015-10-10 DIAGNOSIS — Z5111 Encounter for antineoplastic chemotherapy: Secondary | ICD-10-CM | POA: Insufficient documentation

## 2015-10-10 LAB — COMPREHENSIVE METABOLIC PANEL
ALBUMIN: 3.5 g/dL (ref 3.5–5.0)
ALT: 28 U/L (ref 17–63)
ANION GAP: 9 (ref 5–15)
AST: 25 U/L (ref 15–41)
Alkaline Phosphatase: 45 U/L (ref 38–126)
BILIRUBIN TOTAL: 0.6 mg/dL (ref 0.3–1.2)
BUN: 18 mg/dL (ref 6–20)
CALCIUM: 8.6 mg/dL — AB (ref 8.9–10.3)
CO2: 25 mmol/L (ref 22–32)
Chloride: 107 mmol/L (ref 101–111)
Creatinine, Ser: 0.65 mg/dL (ref 0.61–1.24)
GFR calc Af Amer: >60 mL/min (ref >60)
GLUCOSE: 105 mg/dL — AB (ref 65–99)
POTASSIUM: 3.7 mmol/L (ref 3.5–5.1)
Sodium: 141 mmol/L (ref 135–145)
Total Protein: 5.7 g/dL — ABNORMAL LOW (ref 6.5–8.1)

## 2015-10-10 LAB — CBC WITH DIFFERENTIAL/PLATELET
BASOS PCT: 0 %
Basophils Absolute: 0 10*3/uL (ref 0.0–0.1)
Eosinophils Absolute: 0 10*3/uL (ref 0.0–0.7)
Eosinophils Relative: 0 %
HEMATOCRIT: 31.4 % — AB (ref 39.0–52.0)
Hemoglobin: 10.4 g/dL — ABNORMAL LOW (ref 13.0–17.0)
Lymphocytes Relative: 10 %
Lymphs Abs: 1 10*3/uL (ref 0.7–4.0)
MCH: 29.7 pg (ref 26.0–34.0)
MCHC: 33.1 g/dL (ref 30.0–36.0)
MCV: 89.7 fL (ref 78.0–100.0)
MONO ABS: 0.9 10*3/uL (ref 0.1–1.0)
MONOS PCT: 9 %
NEUTROS ABS: 8.1 10*3/uL — AB (ref 1.7–7.7)
Neutrophils Relative %: 81 %
Platelets: 219 10*3/uL (ref 150–400)
RBC: 3.5 MIL/uL — ABNORMAL LOW (ref 4.22–5.81)
RDW: 16.2 % — AB (ref 11.5–15.5)
WBC: 10 10*3/uL (ref 4.0–10.5)

## 2015-10-10 MED ORDER — VINCRISTINE SULFATE CHEMO INJECTION 1 MG/ML
Freq: Once | INTRAVENOUS | Status: AC
  Administered 2015-10-10: 11:00:00 via INTRAVENOUS
  Filled 2015-10-10: qty 11

## 2015-10-10 MED ORDER — SODIUM CHLORIDE 0.9 % IV SOLN
Freq: Once | INTRAVENOUS | Status: AC
  Administered 2015-10-10: 10:00:00 via INTRAVENOUS
  Filled 2015-10-10: qty 4

## 2015-10-10 NOTE — Progress Notes (Signed)
[name]   HEMATOLOGY/ONCOLOGY INPATIENT PROGRESS NOTE  Date of Service: 10/10/2015  Inpatient Attending: .Brunetta Genera, MD   SUBJECTIVE  Mr Remme is feeling quite good this morning. No acute new concerns. No headaches/fevers/chills. Had a good bowel movement last night.   OBJECTIVE:    PHYSICAL EXAMINATION: . Filed Vitals:   10/09/15 0007 10/09/15 1210 10/09/15 2229 10/10/15 0538  BP: 111/59 121/59 104/54 102/51  Pulse: 64 63 54 56  Temp:  98.2 F (36.8 C) 98.4 F (36.9 C) 98.6 F (37 C)  TempSrc:  Oral Oral Oral  Resp: 16 16 18 16   Height:      Weight:      SpO2: 98% 99% 98% 99%   Filed Weights   10/08/15 1010  Weight: 176 lb 6.4 oz (80.015 kg)   .Body mass index is 24.61 kg/(m^2).  GENERAL:alert, in no acute distress and comfortable SKIN: skin color, texture, turgor are normal, no rashes or significant lesions EYES: normal, conjunctiva are pink and non-injected, sclera clear OROPHARYNX:no exudate, no erythema and lips, buccal mucosa, and tongue normal  NECK: supple, no JVD, thyroid normal size, non-tender, without nodularity LYMPH:  no palpable lymphadenopathy in the cervical, axillary or inguinal LUNGS: clear to auscultation with normal respiratory effort HEART: regular rate & rhythm,  no murmurs and no lower extremity edema ABDOMEN: abdomen soft, non-tender, normoactive bowel sounds  Musculoskeletal: no cyanosis of digits and no clubbing  PSYCH: alert & oriented x 3 with fluent speech NEURO: no focal motor/sensory deficits  MEDICAL HISTORY:  Past Medical History  Diagnosis Date  . EBV infection 2013    Patient notes that he had an EBV infection in 2013 that left him with chronic fatigue. She also notes that he had significant lymphadenopathy and thyroiditis at the time. He has been managing his symptoms with an alternative medicine practitioner in [place] and with other alternative medicines.  . Marijuana use, episodic   . Complication of  anesthesia   . Burkitt lymphoma ([place]) 08/15/2015    SURGICAL HISTORY: Past Surgical History  Procedure Laterality Date  . Tonsillectomy    . Appendectomy    . Laparoscopy N/A 07/20/2015    Procedure: LAPAROSCOPY DIAGNOSTIC, MULTIPLE BIOPSIES, DRAINAGE OF ASCITES;  Surgeon: Fanny Skates, MD;  Location: [facility];  Service: General;  Laterality: N/A;    SOCIAL HISTORY: Social History   Social History  . Marital Status: Married    Spouse Name: N/A  . Number of Children: N/A  . Years of Education: N/A   Occupational History  . Not on file.   Social History Main Topics  . Smoking status: Never Smoker   . Smokeless tobacco: Never Used  . Alcohol Use: No  . Drug Use: 1.00 per week    Special: Marijuana  . Sexual Activity: Yes   Other Topics Concern  . 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   He has a fiance and has been in a steady relationship.      He has tried to maintain a very healthy lifestyle and cycles about 100 miles a week. He also uses a fair number of over-the-counter alternative medicines to stay healthy.    FAMILY HISTORY: Family History  Problem Relation Age of Onset  . Heart failure Father     ALLERGIES:  is allergic to levaquin and sulfa antibiotics.  MEDICATIONS:  Scheduled Meds: . DOXOrubicin/vinCRIStine/etoposide CHEMO IV  infusion for Inpatient CI   Intravenous Once  . oxyCODONE  10 mg Oral Q12H  . pantoprazole (PROTONIX) IV  40 mg Intravenous Q24H  . polyethylene glycol  17 g Oral Daily  . predniSONE  100 mg Oral QAC breakfast  . senna-docusate  2 tablet Oral BID  . sodium bicarbonate/sodium chloride  1 application Mouth Rinse QID   Continuous Infusions: . sodium chloride 20 mL/hr at 10/08/15 1314   PRN Meds:.acetaminophen, alteplase, alum & mag hydroxide-simeth, Cold Pack, heparin lock flush, heparin lock flush, Hot Pack, ondansetron **OR**  ondansetron **OR** ondansetron (ZOFRAN) IV **OR** ondansetron (ZOFRAN) IV, oxyCODONE, senna-docusate, sodium chloride, sodium chloride  REVIEW OF SYSTEMS:    10 Point review of Systems was done is negative except as noted above.   LABORATORY DATA:  I have reviewed the data as listed  . CBC Latest Ref Rng 10/10/2015 10/09/2015 10/08/2015  WBC 4.0 - 10.5 K/uL 10.0 7.7 4.5  Hemoglobin 13.0 - 17.0 g/dL 10.4(L) 10.6(L) 10.2(L)  Hematocrit 39.0 - 52.0 % 31.4(L) 32.7(L) 31.8(L)  Platelets 150 - 400 K/uL 219 258 223    . CMP Latest Ref Rng 10/10/2015 10/09/2015 10/08/2015  Glucose 65 - 99 mg/dL 105(H) 164(H) 74  BUN 6 - 20 mg/dL 18 16 12   Creatinine 0.61 - 1.24 mg/dL 0.65 0.72 0.65  Sodium 135 - 145 mmol/L 141 137 142  Potassium 3.5 - 5.1 mmol/L 3.7 4.1 3.6  Chloride 101 - 111 mmol/L 107 105 105  CO2 22 - 32 mmol/L 25 24 30   Calcium 8.9 - 10.3 mg/dL 8.6(L) 8.8(L) 8.7(L)  Total Protein 6.5 - 8.1 g/dL 5.7(L) 5.9(L) 5.9(L)  Total Bilirubin 0.3 - 1.2 mg/dL 0.6 0.5 0.5  Alkaline Phos 38 - 126 U/L 45 53 52  AST 15 - 41 U/L 25 23 24   ALT 17 - 63 U/L 28 21 21    . Lab Results  Component Value Date   LDH 146 10/08/2015    Component     Latest Ref Rng 10/09/2015  Uric Acid, Serum     4.4 - 7.6 mg/dL 4.9  Lipase     11 - 51 U/L 26  Amylase     28 - 100 U/L 51    RADIOGRAPHIC STUDIES: I have personally reviewed the radiological images as listed and agreed with the findings in the report. Dg Abd Portable 2v  09/10/2015  CLINICAL DATA:  Nausea and vomiting today, history of lymphoma on chemotherapy, evaluate for bowel obstruction. EXAM: PORTABLE ABDOMEN - 2 VIEW COMPARISON:  Abdominal pelvic CT scan of August 26, 2015 FINDINGS: There is increased stool burden throughout the colon. There is a small amount of gas within minimally distended small bowel loops. There are no free extraluminal gas collections. There are no abnormal soft tissue calcifications. The bony structures exhibit no  acute abnormalities. IMPRESSION: Increase colonic stool burden suggests clinical constipation. Mild distention of mid and distal small bowel loops is likely secondary to the stool burden in the colon the rather than to intrinsic small bowel obstructive processes. There is no evidence of perforation. Electronically Signed   By: David  Martinique M.D.   On: 09/10/2015 15:10   Ir Epidurography  09/11/2015  CLINICAL DATA:  LP 09/06/2015 for intrathecal chemotherapy administration, Persistent postprocedure spinal positional headache despite conservative maneuvers EXAM: LUMBAR EPIDUROGRAM AND BLOOD PATCH FLUOROSCOPY TIME:  0.3 minutes, 17 uGym2 DAP COMPARISON:  LP 09/06/2015 TECHNIQUE: The skin entry site from previous lumbar puncture was localized at L5-S1. An appropriate skin  entry site was determined. Operator donned sterile gloves and mask. Skin site was marked, prepped with Betadine, and draped in usual sterile fashion, and infiltrated locally with 1% lidocaine. The 20 gauge Crawford needle was advanced into the lumbar posterior epidural space near the midline using a right interlaminar approach at L5-S1using loss of resistance technique. Diagnostic injection of 105ml Omnipaque-180 demonstrates good epidural spread above and below the needle tip and crossing the midline. No intravascular or subarachnoid component. 61ml of autologous blood were then administered as lumbar epidural blood patch. No immediate complication. IMPRESSION: 1. Technically successful lumbar epidural blood patch under fluoroscopy. The patient was counseled on the importance of laying flat for an additional 24 hours and maintaining good p.o. fluid intake. Electronically Signed   By: Lucrezia Europe M.D.   On: 09/11/2015 13:09   Dg Fluoro Guide Lumbar Puncture  10/08/2015  CLINICAL DATA:  Lymphoma. EXAM: FLUOROSCOPICALLY GUIDED LUMBAR PUNCTURE FOR INTRATHECAL CHEMOTHERAPY TECHNIQUE: Informed consent was obtained from the patient prior to the  procedure, including potential complications of headache, allergy, and pain. A 'time out' was performed. With the patient prone, the lower back was prepped with Betadine. 1% Lidocaine was used for local anesthesia. Lumbar puncture was performed at the L2-3 using a 20 gauge needle with return of clear CSF. Opening pressure 20 cm H2O. 20 mg Of methotrexate was injected into the subarachnoid space. The patient tolerated the procedure well without apparent complication. FLUOROSCOPY TIME:  Fluoroscopy Time (in minutes and seconds): 0 minutes 26 seconds IMPRESSION: Intrathecal injection of chemotherapy without complication Electronically Signed   By: Franchot Gallo M.D.   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 Cytogenetics showed Myc Break apart event associated with chromosomal variants of Burkitt's lymphoma t(2;8) and t(8;22). Patient is status post 3 cycles of EPOCH-R and received 1 dose of intrathecal methotrexate for CNS prophylaxis. His LDH level is down from 1200s to 300 to 188 to 155. Stable today at 146 PET/CT 08/22/2015 after 2 cycles of treatment. Complete metabolic response. No residual hypermetabolic lymphoma. No clinical or biochemical evidence of disease progression.  2) status post spinal headaches due to CSF leak treated with epidural blood patch -during Cycle 3. Now resolved. 3) Elevated transaminases and bilirubin due to chemotherapy now resolved. 4) Emotional stress due to the rigors of his treatment regimen and significant day-to-day stress of managing a business and family life in the context of a life-threatening disease. Better compensated now. 5) Intermittent abdominal discomfort with food intake ?gastritis. Pancreatic enzymes WNL.  Plan -tolerating treatment without any acute new concerns. C4 Day 3 EPOCH-R today --had a follow up discussion with patient regarding  the IT methotrexate day 5---he had decided to hold off on that. --Continue current pain mx and bowel prophylaxis -we will consider UGI series +small bowel followup through if persistent discomfort. Might have to evaluate the gall bladder as well. -Neulasta day 7 as outpatient. -PPI for GI prophylaxis with high dose steroids. -regular diet with smaller more frequent meals. -will continue to f/u daily    Sullivan Lone MD [place] [facility]IV[facility]On[facility] Physician [place]  (Office):       [phone] (Work cell):  [phone] (Fax):           [phone]

## 2015-10-10 NOTE — Progress Notes (Signed)
Manual calculation of BSA and dosing for doxorubicin, etoposide, and vincristine completed. Secondary verification by Drue Dun, RN

## 2015-10-11 ENCOUNTER — Telehealth: Payer: Self-pay | Admitting: Hematology

## 2015-10-11 ENCOUNTER — Inpatient Hospital Stay (HOSPITAL_COMMUNITY): Payer: BLUE CROSS/BLUE SHIELD

## 2015-10-11 ENCOUNTER — Other Ambulatory Visit: Payer: Self-pay | Admitting: Hematology

## 2015-10-11 DIAGNOSIS — R110 Nausea: Secondary | ICD-10-CM

## 2015-10-11 DIAGNOSIS — K5909 Other constipation: Secondary | ICD-10-CM

## 2015-10-11 DIAGNOSIS — B370 Candidal stomatitis: Secondary | ICD-10-CM | POA: Insufficient documentation

## 2015-10-11 DIAGNOSIS — R109 Unspecified abdominal pain: Secondary | ICD-10-CM | POA: Insufficient documentation

## 2015-10-11 DIAGNOSIS — E860 Dehydration: Secondary | ICD-10-CM

## 2015-10-11 LAB — CBC WITH DIFFERENTIAL/PLATELET
BASOS PCT: 0 %
Basophils Absolute: 0 10*3/uL (ref 0.0–0.1)
EOS ABS: 0 10*3/uL (ref 0.0–0.7)
EOS PCT: 0 %
HEMATOCRIT: 30.5 % — AB (ref 39.0–52.0)
Hemoglobin: 10.1 g/dL — ABNORMAL LOW (ref 13.0–17.0)
Lymphocytes Relative: 14 %
Lymphs Abs: 0.9 10*3/uL (ref 0.7–4.0)
MCH: 29.9 pg (ref 26.0–34.0)
MCHC: 33.1 g/dL (ref 30.0–36.0)
MCV: 90.2 fL (ref 78.0–100.0)
MONO ABS: 0.4 10*3/uL (ref 0.1–1.0)
MONOS PCT: 7 %
NEUTROS ABS: 4.8 10*3/uL (ref 1.7–7.7)
Neutrophils Relative %: 79 %
PLATELETS: 192 10*3/uL (ref 150–400)
RBC: 3.38 MIL/uL — ABNORMAL LOW (ref 4.22–5.81)
RDW: 15.5 % (ref 11.5–15.5)
WBC: 6.2 10*3/uL (ref 4.0–10.5)

## 2015-10-11 LAB — COMPREHENSIVE METABOLIC PANEL
ALBUMIN: 3.4 g/dL — AB (ref 3.5–5.0)
ALT: 26 U/L (ref 17–63)
ANION GAP: 5 (ref 5–15)
AST: 19 U/L (ref 15–41)
Alkaline Phosphatase: 41 U/L (ref 38–126)
BILIRUBIN TOTAL: 0.7 mg/dL (ref 0.3–1.2)
BUN: 23 mg/dL — ABNORMAL HIGH (ref 6–20)
CHLORIDE: 107 mmol/L (ref 101–111)
CO2: 27 mmol/L (ref 22–32)
Calcium: 8.3 mg/dL — ABNORMAL LOW (ref 8.9–10.3)
Creatinine, Ser: 0.65 mg/dL (ref 0.61–1.24)
GFR calc Af Amer: >60 mL/min (ref >60)
GLUCOSE: 99 mg/dL (ref 65–99)
POTASSIUM: 3.6 mmol/L (ref 3.5–5.1)
Sodium: 139 mmol/L (ref 135–145)
TOTAL PROTEIN: 5.4 g/dL — AB (ref 6.5–8.1)

## 2015-10-11 MED ORDER — NYSTATIN 100000 UNIT/ML MT SUSP: 5.0000 mL | Freq: Four times a day (QID) | OROMUCOSAL | Status: DC

## 2015-10-11 MED ORDER — SODIUM CHLORIDE 0.9 % IV SOLN
Freq: Once | INTRAVENOUS | Status: AC
  Administered 2015-10-11: 09:00:00 via INTRAVENOUS
  Filled 2015-10-11: qty 4

## 2015-10-11 MED ORDER — SODIUM CHLORIDE 0.9 % IV SOLN
INTRAVENOUS | Status: AC
  Administered 2015-10-11: 10:00:00 via INTRAVENOUS

## 2015-10-11 MED ORDER — NYSTATIN 100000 UNIT/ML MT SUSP
5.0000 mL | Freq: Four times a day (QID) | OROMUCOSAL | Status: DC
  Administered 2015-10-11: 500000 [IU] via OROMUCOSAL
  Filled 2015-10-11 (×3): qty 5

## 2015-10-11 MED ORDER — VINCRISTINE SULFATE CHEMO INJECTION 1 MG/ML
Freq: Once | INTRAVENOUS | Status: AC
  Administered 2015-10-11: 10:00:00 via INTRAVENOUS
  Filled 2015-10-11: qty 11

## 2015-10-11 MED ORDER — LANSOPRAZOLE 30 MG PO CPDR: 30.0000 mg | DELAYED_RELEASE_CAPSULE | Freq: Every day | ORAL | Status: DC

## 2015-10-11 MED ORDER — SODIUM BICARBONATE/SODIUM CHLORIDE MOUTHWASH: 1.0000 "application " | Freq: Four times a day (QID) | OROMUCOSAL | Status: DC

## 2015-10-11 NOTE — Telephone Encounter (Signed)
per pof to sch pt appt-cld pt and adv of appt time & date

## 2015-10-11 NOTE — Progress Notes (Signed)
dosages and calculations of doxorubicin,vepesid,oncovin done with Bernabe Dorce Education officer, environmental.

## 2015-10-11 NOTE — Discharge Instructions (Signed)
-   f/u for your Neulasta shot on Monday 10/15/2015 - f/u in clinic with Dr Irene Limbo in 2 weeks with rpt labs. Earlier if worsening abdominal pain or any other new concerns. -your next cycle (5th) of EPOCH-R is due on 10/29/2015 - will finalize date based on how you are doing in 2 weeks and your personal preference around christmas time.

## 2015-10-11 NOTE — Progress Notes (Signed)
At bedtime, pt reported his stomach was unsettled and said he had some mild nausea. He thinks whatever he ate for dinner was upsetting his stomach. He was agreeable to take 4mg  of zofran PO, which decreased his discomfort but didn't fully eliminate it. This morning at 0500 when RN went to draw blood, pt stated "my stomach is killing me". When asked to elaborate, he wasn't very specific. It seems to be a mixture of pain and nausea as best I can understand. He was offered prn medication but declined. I reviewed his options with him and encouraged him to take something, he insisted he didn't want anything for the discomfort. I instructed him to call me if it gets any worse or if he decides he wants medication. Pt has been NPO since midnight for abdominal u/s this morning. Continue to monitor. Hortencia Conradi RN

## 2015-10-11 NOTE — Progress Notes (Signed)
Late Entry for 10/10/2015: Discussed with patient the fall risk dangers of keeping his bed in the high position.  Patient verbalizes understanding of this concern, yet prefers to keep his bed elevated.    Also discussed at this time with the patient was the concern by the nursing staff that he is adjusting his chemo pump on his own when it alarms with air in the line.  As Doxyrubicin, Etoposide and Vincristine combination chemotherapy is known to produce bubbles within the tubing, it is necessary to move the air bubbles through the line and this requires adjustments with the delivery pump.  The patient admitted to me that he had done this throughout the prior night shift as he had called for the nurse and the response time was slow.  I committed to speaking with the nursing staff about response time and asked him to leave the adjustments and manipulations of his chemotherapy to the nursing staff.

## 2015-10-11 NOTE — Progress Notes (Signed)
Donald [name]   HEMATOLOGY/ONCOLOGY INPATIENT PROGRESS NOTE  Date of Service: 10/11/2015  Inpatient Attending: .Brunetta Genera, MD   SUBJECTIVE  [name] was seen this morning. Notes some mild nausea and queasiness in the belly but overall his abdomen feels a little less painful. Has been nothing by mouth for an ultrasound of the abdomen scheduled for this morning. No other acute issues with her chemotherapy. Notes some fatigue. No headache. No other new focal symptoms. Had a little bit of bowel movement yesterday. He was encouraged to ambulate. Notes decreased fluid intake and appears to be mildly dehydrated. Noted to have some oral thrush which seems to be affecting his taste as well. Was encouraged to use nausea medications as needed. He is keen to be discharged tomorrow.  OBJECTIVE:    PHYSICAL EXAMINATION: . Filed Vitals:   10/09/15 2229 10/10/15 0538 10/10/15 1344 10/11/15 0650  BP: 104/54 102/51 100/62 104/54  Pulse: 54 56 53 61  Temp: 98.4 F (36.9 C) 98.6 F (37 C) 98 F (36.7 C) 97.9 F (36.6 C)  TempSrc: Oral Oral Oral Oral  Resp: 18 16 18 16   Height:      Weight:      SpO2: 98% 99% 99% 99%   Filed Weights   10/08/15 1010  Weight: 176 lb 6.4 oz (80.015 kg)   .Body mass index is 24.61 kg/(m^2).  GENERAL:alert, in no acute distress and comfortable SKIN: skin color, texture, turgor are normal, no rashes or significant lesions EYES: normal, conjunctiva are pink and non-injected, sclera clear OROPHARYNX: Oropharyngeal thrush noted, oral mucosa somewhat dry. NECK: supple, no JVD, thyroid normal size, non-tender, without nodularity LYMPH:  no palpable lymphadenopathy in the cervical, axillary or inguinal LUNGS: clear to auscultation with normal respiratory effort HEART: regular rate & rhythm,  no murmurs and no lower extremity edema ABDOMEN: abdomen soft, mild tenderness to palpation upper abdomen though this is better than previously, normoactive bowel sounds. No  guarding rigidity or rebound.  Musculoskeletal: no cyanosis of digits and no clubbing  PSYCH: alert & oriented x 3 with fluent speech NEURO: no focal motor/sensory deficits  MEDICAL HISTORY:  Past Medical History  Diagnosis Date  . EBV infection 2013    Patient notes that he had an EBV infection in 2013 that left him with chronic fatigue. She also notes that he had significant lymphadenopathy and thyroiditis at the time. He has been managing his symptoms with an alternative medicine practitioner in [place] and with other alternative medicines.  . Marijuana use, episodic   . Complication of anesthesia   . Burkitt lymphoma ([place]) 08/15/2015    SURGICAL HISTORY: Past Surgical History  Procedure Laterality Date  . Tonsillectomy    . Appendectomy    . Laparoscopy N/A 07/20/2015    Procedure: LAPAROSCOPY DIAGNOSTIC, MULTIPLE BIOPSIES, DRAINAGE OF ASCITES;  Surgeon: Fanny Skates, MD;  Location: [facility];  Service: General;  Laterality: N/A;    SOCIAL HISTORY: Social History   Social History  . Marital Status: Married    Spouse Name: N/A  . Number of Children: N/A  . Years of Education: N/A   Occupational History  . Not on file.   Social History Main Topics  . Smoking status: Never Smoker   . Smokeless tobacco: Never Used  . Alcohol Use: No  . Drug Use: 1.00 per week    Special: Marijuana  . Sexual Activity: Yes   Other Topics Concern  . 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   He has a fiance and has been in a steady relationship.      He has tried to maintain a very healthy lifestyle and cycles about 100 miles a week. He also uses a fair number of over-the-counter alternative medicines to stay healthy.    FAMILY HISTORY: Family History  Problem Relation Age of Onset  . Heart failure Father     ALLERGIES:  is allergic to levaquin and sulfa  antibiotics.  MEDICATIONS:  Scheduled Meds: . DOXOrubicin/vinCRIStine/etoposide CHEMO IV infusion for Inpatient CI   Intravenous Once  . nystatin  5 mL Mouth/Throat QID  . oxyCODONE  10 mg Oral Q12H  . pantoprazole (PROTONIX) IV  40 mg Intravenous Q24H  . polyethylene glycol  17 g Oral Daily  . predniSONE  100 mg Oral QAC breakfast  . senna-docusate  2 tablet Oral BID  . sodium bicarbonate/sodium chloride  1 application Mouth Rinse QID   Continuous Infusions: . sodium chloride 20 mL/hr (10/10/15 1132)  . sodium chloride 500 mL/hr at 10/11/15 0945   PRN Meds:.acetaminophen, alteplase, alum & mag hydroxide-simeth, Cold Pack, heparin lock flush, heparin lock flush, Hot Pack, ondansetron **OR** ondansetron **OR** ondansetron (ZOFRAN) IV **OR** ondansetron (ZOFRAN) IV, oxyCODONE, senna-docusate, sodium chloride, sodium chloride  REVIEW OF SYSTEMS:    10 Point review of Systems was done is negative except as noted above.   LABORATORY DATA:  I have reviewed the data as listed  . CBC Latest Ref Rng 10/11/2015 10/10/2015 10/09/2015  WBC 4.0 - 10.5 K/uL 6.2 10.0 7.7  Hemoglobin 13.0 - 17.0 g/dL 10.1(L) 10.4(L) 10.6(L)  Hematocrit 39.0 - 52.0 % 30.5(L) 31.4(L) 32.7(L)  Platelets 150 - 400 K/uL 192 219 258    . CMP Latest Ref Rng 10/11/2015 10/10/2015 10/09/2015  Glucose 65 - 99 mg/dL 99 105(H) 164(H)  BUN 6 - 20 mg/dL 23(H) 18 16  Creatinine 0.61 - 1.24 mg/dL 0.65 0.65 0.72  Sodium 135 - 145 mmol/L 139 141 137  Potassium 3.5 - 5.1 mmol/L 3.6 3.7 4.1  Chloride 101 - 111 mmol/L 107 107 105  CO2 22 - 32 mmol/L 27 25 24   Calcium 8.9 - 10.3 mg/dL 8.3(L) 8.6(L) 8.8(L)  Total Protein 6.5 - 8.1 g/dL 5.4(L) 5.7(L) 5.9(L)  Total Bilirubin 0.3 - 1.2 mg/dL 0.7 0.6 0.5  Alkaline Phos 38 - 126 U/L 41 45 53  AST 15 - 41 U/L 19 25 23   ALT 17 - 63 U/L 26 28 21    . Lab Results  Component Value Date   LDH 146 10/08/2015    Component     Latest Ref Rng 10/09/2015  Uric Acid, Serum     4.4  - 7.6 mg/dL 4.9  Lipase     11 - 51 U/L 26  Amylase     28 - 100 U/L 51    RADIOGRAPHIC STUDIES: I have personally reviewed the radiological images as listed and agreed with the findings in the report. Ir Epidurography  09/11/2015  CLINICAL DATA:  LP 09/06/2015 for intrathecal chemotherapy administration, Persistent postprocedure spinal positional headache despite conservative maneuvers EXAM: LUMBAR EPIDUROGRAM AND BLOOD PATCH FLUOROSCOPY TIME:  0.3 minutes, 17 uGym2 DAP COMPARISON:  LP 09/06/2015 TECHNIQUE: The skin entry site from previous lumbar puncture was localized at L5-S1. An appropriate skin entry site was determined. Operator donned sterile gloves and mask. Skin site was marked, prepped with Betadine, and draped in usual sterile  fashion, and infiltrated locally with 1% lidocaine. The 20 gauge Crawford needle was advanced into the lumbar posterior epidural space near the midline using a right interlaminar approach at L5-S1using loss of resistance technique. Diagnostic injection of 63ml Omnipaque-180 demonstrates good epidural spread above and below the needle tip and crossing the midline. No intravascular or subarachnoid component. 62ml of autologous blood were then administered as lumbar epidural blood patch. No immediate complication. IMPRESSION: 1. Technically successful lumbar epidural blood patch under fluoroscopy. The patient was counseled on the importance of laying flat for an additional 24 hours and maintaining good p.o. fluid intake. Electronically Signed   By: Lucrezia Europe M.D.   On: 09/11/2015 13:09   Dg Fluoro Guide Lumbar Puncture  10/08/2015  CLINICAL DATA:  Lymphoma. EXAM: FLUOROSCOPICALLY GUIDED LUMBAR PUNCTURE FOR INTRATHECAL CHEMOTHERAPY TECHNIQUE: Informed consent was obtained from the patient prior to the procedure, including potential complications of headache, allergy, and pain. A 'time out' was performed. With the patient prone, the lower back was prepped with Betadine. 1%  Lidocaine was used for local anesthesia. Lumbar puncture was performed at the L2-3 using a 20 gauge needle with return of clear CSF. Opening pressure 20 cm H2O. 20 mg Of methotrexate was injected into the subarachnoid space. The patient tolerated the procedure well without apparent complication. FLUOROSCOPY TIME:  Fluoroscopy Time (in minutes and seconds): 0 minutes 26 seconds IMPRESSION: Intrathecal injection of chemotherapy without complication Electronically Signed   By: Franchot Gallo M.D.   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 Cytogenetics showed Myc Break apart event associated with chromosomal variants of Burkitt's lymphoma t(2;8) and t(8;22). Patient is currently admitted for his fourth cycle of EPOCH-R and received his 2nd dose of intrathecal methotrexate for CNS prophylaxis. His LDH level is down from 1200s to 300 to 188 to 155. Stable today at 146 PET/CT 08/22/2015 after 2 cycles of treatment. Complete metabolic response. No residual hypermetabolic lymphoma. No clinical or biochemical evidence of disease progression.  2) status post spinal headaches due to CSF leak treated with epidural blood patch -during Cycle 3. No issues with this cycle. 3) Elevated transaminases and bilirubin due to chemotherapy now resolved. 4) Emotional stress due to the rigors of his treatment regimen and significant day-to-day stress of managing a business and family life in the context of a life-threatening disease. Better compensated now. 5) Intermittent abdominal discomfort with food intake ?gastritis. Pancreatic enzymes WNL. Still feeling somewhat queasy -awaiting ultrasound today to rule out gallbladder pathology. Cannot rule out the possibility of dysmotility due to chemotherapy, residual scar tissue from Burkitt's lymphoma and with h/o small bowel involvement by Burkitt's. #6 oropharyngeal  thrush #7 mild dehydration #8 mild constipation due to chemotherapy and pain meds Plan -NPO for ultrasound today  -Ordered nystatin swish and swallow for oral thrush. Avoiding fluconazole due to possible drug retraction with chemotherapy. -Normal saline fluid bolus. -Continue PPI and laxatives. -Patient was encouraged to use his antinausea medications and Ativan when necessary . -Patient encouraged to ambulate more especially since he is not on chemical DVT prophylaxis as per his choice. -Overall tolerating treatment without any acute new concerns. C4 Day 4 EPOCH-R today. Received day 1 intrathecal methotrexate. -we will consider UGI series +small bowel followup through if persistent discomfort (as outpatient) symptoms have been some what chronic. -PPI for GI prophylaxis with high dose steroids. -  regular diet with smaller more frequent meals. -One of my colleagues will see the patient tomorrow since I will be out of town. -His discharge orders have been placed. Do not anticipate significant changes unless there is a change in the patient's clinical status.  Dispo: Likely discharged to home tomorrow after cyclophosphamide and Rituxan. We'll follow-up in that infusion center for Neulasta on 10/15/2015. Clinic follow-up with labs with Dr. Irene Limbo in 2 weeks.   Sullivan Lone MD [place] AAHIVMS [facility] Hematology/Oncology Physician [facility]  (Office):       [phone] (Work cell):  [phone] (Fax):           [phone]

## 2015-10-12 DIAGNOSIS — K2990 Gastroduodenitis, unspecified, without bleeding: Secondary | ICD-10-CM

## 2015-10-12 DIAGNOSIS — C8378 Burkitt lymphoma, lymph nodes of multiple sites: Secondary | ICD-10-CM

## 2015-10-12 DIAGNOSIS — K2970 Gastritis, unspecified, without bleeding: Secondary | ICD-10-CM

## 2015-10-12 LAB — CBC WITH DIFFERENTIAL/PLATELET
BASOS PCT: 0 %
Basophils Absolute: 0 10*3/uL (ref 0.0–0.1)
Eosinophils Absolute: 0 10*3/uL (ref 0.0–0.7)
Eosinophils Relative: 0 %
HEMATOCRIT: 30.4 % — AB (ref 39.0–52.0)
Hemoglobin: 10.3 g/dL — ABNORMAL LOW (ref 13.0–17.0)
Lymphocytes Relative: 17 %
Lymphs Abs: 0.8 10*3/uL (ref 0.7–4.0)
MCH: 29.9 pg (ref 26.0–34.0)
MCHC: 33.9 g/dL (ref 30.0–36.0)
MCV: 88.4 fL (ref 78.0–100.0)
MONO ABS: 0.2 10*3/uL (ref 0.1–1.0)
MONOS PCT: 4 %
NEUTROS ABS: 3.6 10*3/uL (ref 1.7–7.7)
Neutrophils Relative %: 79 %
Platelets: 211 10*3/uL (ref 150–400)
RBC: 3.44 MIL/uL — ABNORMAL LOW (ref 4.22–5.81)
RDW: 14.8 % (ref 11.5–15.5)
WBC: 4.6 10*3/uL (ref 4.0–10.5)

## 2015-10-12 LAB — COMPREHENSIVE METABOLIC PANEL
ALBUMIN: 3.4 g/dL — AB (ref 3.5–5.0)
ALT: 35 U/L (ref 17–63)
ANION GAP: 8 (ref 5–15)
AST: 25 U/L (ref 15–41)
Alkaline Phosphatase: 39 U/L (ref 38–126)
BILIRUBIN TOTAL: 1.2 mg/dL (ref 0.3–1.2)
BUN: 15 mg/dL (ref 6–20)
CALCIUM: 8.6 mg/dL — AB (ref 8.9–10.3)
CO2: 26 mmol/L (ref 22–32)
CREATININE: 0.59 mg/dL — AB (ref 0.61–1.24)
Chloride: 106 mmol/L (ref 101–111)
GFR calc Af Amer: >60 mL/min (ref >60)
Glucose, Bld: 94 mg/dL (ref 65–99)
POTASSIUM: 3.4 mmol/L — AB (ref 3.5–5.1)
Sodium: 140 mmol/L (ref 135–145)
TOTAL PROTEIN: 5.7 g/dL — AB (ref 6.5–8.1)

## 2015-10-12 MED ORDER — FAMOTIDINE IN NACL 20-0.9 MG/50ML-% IV SOLN
20.0000 mg | Freq: Once | INTRAVENOUS | Status: DC | PRN
Start: 2015-10-12 — End: 2015-10-12
  Filled 2015-10-12: qty 50

## 2015-10-12 MED ORDER — DIPHENHYDRAMINE HCL 50 MG/ML IJ SOLN: 50.0000 mg | Freq: Once | INTRAMUSCULAR | Status: DC | PRN

## 2015-10-12 MED ORDER — EPINEPHRINE HCL 1 MG/ML IJ SOLN: 0.5000 mg | Freq: Once | INTRAMUSCULAR | Status: DC | PRN

## 2015-10-12 MED ORDER — SODIUM CHLORIDE 0.9 % IV SOLN
Freq: Once | INTRAVENOUS | Status: AC
  Administered 2015-10-12: 08:00:00 via INTRAVENOUS

## 2015-10-12 MED ORDER — EPINEPHRINE HCL 0.1 MG/ML IJ SOSY: 0.2500 mg | PREFILLED_SYRINGE | Freq: Once | INTRAMUSCULAR | Status: DC | PRN

## 2015-10-12 MED ORDER — HEPARIN SOD (PORK) LOCK FLUSH 100 UNIT/ML IV SOLN
500.0000 [IU] | Freq: Once | INTRAVENOUS | Status: DC
  Filled 2015-10-12: qty 5

## 2015-10-12 MED ORDER — ALBUTEROL SULFATE (2.5 MG/3ML) 0.083% IN NEBU: 2.5000 mg | INHALATION_SOLUTION | Freq: Once | RESPIRATORY_TRACT | Status: DC | PRN

## 2015-10-12 MED ORDER — ACETAMINOPHEN 325 MG PO TABS: 650.0000 mg | ORAL_TABLET | Freq: Once | ORAL | Status: DC

## 2015-10-12 MED ORDER — ALTEPLASE 2 MG IJ SOLR: 2.0000 mg | Freq: Once | INTRAMUSCULAR | Status: DC | PRN

## 2015-10-12 MED ORDER — METHYLPREDNISOLONE SODIUM SUCC 125 MG IJ SOLR
125.0000 mg | Freq: Once | INTRAMUSCULAR | Status: DC | PRN
  Filled 2015-10-12: qty 2

## 2015-10-12 MED ORDER — EPINEPHRINE HCL 0.1 MG/ML IJ SOSY
0.2500 mg | PREFILLED_SYRINGE | Freq: Once | INTRAMUSCULAR | Status: DC | PRN
Start: 2015-10-12 — End: 2015-10-12

## 2015-10-12 MED ORDER — SODIUM CHLORIDE 0.9 % IV SOLN
750.0000 mg/m2 | Freq: Once | INTRAVENOUS | Status: AC
  Administered 2015-10-12: 1600 mg via INTRAVENOUS
  Filled 2015-10-12: qty 80

## 2015-10-12 MED ORDER — DIPHENHYDRAMINE HCL 50 MG PO CAPS: 50.0000 mg | ORAL_CAPSULE | Freq: Once | ORAL | Status: DC

## 2015-10-12 MED ORDER — DIPHENHYDRAMINE HCL 50 MG/ML IJ SOLN: 25.0000 mg | Freq: Once | INTRAMUSCULAR | Status: DC | PRN

## 2015-10-12 MED ORDER — SODIUM CHLORIDE 0.9 % IJ SOLN: 3.0000 mL | INTRAMUSCULAR | Status: DC | PRN

## 2015-10-12 MED ORDER — SODIUM CHLORIDE 0.9 % IV SOLN
Freq: Once | INTRAVENOUS | Status: AC
  Administered 2015-10-12: 09:00:00 via INTRAVENOUS
  Filled 2015-10-12: qty 4

## 2015-10-12 MED ORDER — SODIUM CHLORIDE 0.9 % IV SOLN
375.0000 mg/m2 | Freq: Once | INTRAVENOUS | Status: AC
  Administered 2015-10-12: 800 mg via INTRAVENOUS
  Filled 2015-10-12: qty 50

## 2015-10-12 MED ORDER — SODIUM CHLORIDE 0.9 % IJ SOLN: 10.0000 mL | INTRAMUSCULAR | Status: DC | PRN

## 2015-10-12 MED ORDER — HEPARIN SOD (PORK) LOCK FLUSH 100 UNIT/ML IV SOLN: 250.0000 [IU] | Freq: Once | INTRAVENOUS | Status: DC | PRN

## 2015-10-12 MED ORDER — HEPARIN SOD (PORK) LOCK FLUSH 100 UNIT/ML IV SOLN: 500.0000 [IU] | Freq: Once | INTRAVENOUS | Status: DC | PRN

## 2015-10-12 MED ORDER — SODIUM CHLORIDE 0.9 % IV SOLN: Freq: Once | INTRAVENOUS | Status: DC | PRN

## 2015-10-12 NOTE — Progress Notes (Signed)
Patient BSA, Drug Doses and Dilutions for Cytoxan and Rituxan verified with 2nd RN Kirkland Hun.

## 2015-10-12 NOTE — Discharge Summary (Signed)
Physician Discharge Summary  Patient ID: Donald [name] @ATTENDINGNPI @ MRN: [id number] DOB/AGE: Oct 03, 1976 39 y.o.  Admit date: 10/08/2015 Discharge date: 10/12/2015  Discharge Diagnoses:  Active Problems:   Burkitt's lymphoma ([place])   Gastritis and gastroduodenitis   Encounter for chemotherapy management   Abdominal pain   Dehydration   Oral thrush   Discharged Condition: Stable  Discharge Labs:  hemoglobin 10.3, platelets 211,000, ANC 3.6  Significant Diagnostic Studies: None  Consults: None  Procedures:  Infusional chemotherapy, intrathecal methotrexate  Disposition: 01-Home or Self Care     Medication List    TAKE these medications        lactulose 10 GM/15ML solution  Commonly known as:  CHRONULAC  Take 30 mLs by mouth 2 (two) times daily as needed (For constipation.).     lansoprazole 30 MG capsule  Commonly known as:  PREVACID  Take 1 capsule (30 mg total) by mouth daily at 12 noon.     multivitamin with minerals Tabs tablet  Take 1 tablet by mouth daily.     nystatin 100000 UNIT/ML suspension  Commonly known as:  MYCOSTATIN  Use as directed 5 mLs (500,000 Units total) in the mouth or throat 4 (four) times daily. For 10 days     ondansetron 8 MG tablet  Commonly known as:  ZOFRAN  Take 1 tablet (8 mg total) by mouth every 8 (eight) hours as needed for nausea or vomiting.     oxyCODONE 10 mg 12 hr tablet  Commonly known as:  OXYCONTIN  Take 1 tablet (10 mg total) by mouth every 12 (twelve) hours.     oxycodone 5 MG capsule  Commonly known as:  OXY-IR  Take 1 capsule (5 mg total) by mouth every 6 (six) hours as needed.     polyethylene glycol packet  Commonly known as:  MIRALAX / GLYCOLAX  Take 17 g by mouth daily.     PRESCRIPTION MEDICATION  Patient receives his chemo treatments at the [facility] with Dr. Irene Limbo. He received Neulasta 6mg  on 08/21/15. He is on a 21 day cycle of EPOCH-R that he receives during  hospitalization.     senna-docusate 8.6-50 MG tablet  Commonly known as:  Senokot-S  Take 2 tablets by mouth 2 (two) times daily.     sodium bicarbonate/sodium chloride Soln  1 application by Mouth Rinse route 4 (four) times daily.            Follow-up Information    Follow up with Sullivan Lone, MD On 10/15/2015.   Specialties:  Hematology, Oncology   Why:  10/15/2015 for Neulasta, 10/24/2015 as scheduled   Contact information:   [place] [address] [phone]       Hospital Course: Donald [name] was admitted 10/08/2015 for cycle 4 EPOCH-R chemotherapy. He received intrathecal methotrexate on 10/08/2015. He tolerated the chemotherapy without significant acute toxicity. He completed cyclophosphamide and rituximab on 10/12/2015 and was then discharged to home.  He had nausea and mild abdominal discomfort during this admission. An abdominal ultrasound on 10/11/2015 revealed no acute finding.  Physical exam on the morning of 10/12/2015: HEENT: Mild whitecoat over the tongue, no ulcers, no buccal thrush Lungs: Clear bilaterally Cardiac: Regular rate and rhythm, manual pulse 44-48 Abdomen: Soft, mild diffuse tenderness, no hepatosplenomegaly, no mass Vascular: No leg edema  Port-A-Cath: No erythema   Donald [name] appeared stable for discharge following completion of chemotherapy on 10/12/2015. He will return to the [facility] last on 10/15/2015.      Discharge Instructions    Call MD for:  difficulty breathing, headache or visual disturbances    Complete by:  As directed      Call MD for:  extreme fatigue    Complete by:  As directed      Call MD for:  hives    Complete by:  As directed      Call MD for:  persistant dizziness or light-headedness    Complete by:  As directed      Call MD for:  persistant nausea and vomiting    Complete by:  As directed      Call MD for:  redness, tenderness, or signs of infection (pain, swelling, redness, odor or  green/yellow discharge around incision site)    Complete by:  As directed      Call MD for:  severe uncontrolled pain    Complete by:  As directed      Call MD for:  temperature >100.4    Complete by:  As directed      Call MD for:    Complete by:  As directed   Increasing abdominal pain     Diet - low sodium heart healthy    Complete by:  As directed      Increase activity slowly    Complete by:  As directed            Signed: Betsy Coder, MD 10/12/2015, 8:15 AM

## 2015-10-14 ENCOUNTER — Emergency Department (HOSPITAL_COMMUNITY)
Admission: EM | Admit: 2015-10-14 | Discharge: 2015-10-14 | Disposition: A | Discharge status: Home / Self Care | Payer: BLUE CROSS/BLUE SHIELD | Attending: Emergency Medicine | Admitting: Emergency Medicine

## 2015-10-14 ENCOUNTER — Emergency Department (HOSPITAL_COMMUNITY): Payer: BLUE CROSS/BLUE SHIELD

## 2015-10-14 ENCOUNTER — Encounter (HOSPITAL_COMMUNITY): Payer: Self-pay

## 2015-10-14 DIAGNOSIS — Z8619 Personal history of other infectious and parasitic diseases: Secondary | ICD-10-CM | POA: Diagnosis not present

## 2015-10-14 DIAGNOSIS — H538 Other visual disturbances: Secondary | ICD-10-CM | POA: Insufficient documentation

## 2015-10-14 DIAGNOSIS — Z79899 Other long term (current) drug therapy: Secondary | ICD-10-CM | POA: Insufficient documentation

## 2015-10-14 DIAGNOSIS — R112 Nausea with vomiting, unspecified: Secondary | ICD-10-CM | POA: Insufficient documentation

## 2015-10-14 DIAGNOSIS — R519 Headache, unspecified: Secondary | ICD-10-CM

## 2015-10-14 DIAGNOSIS — C8370 Burkitt lymphoma, unspecified site: Secondary | ICD-10-CM | POA: Insufficient documentation

## 2015-10-14 DIAGNOSIS — R51 Headache: Secondary | ICD-10-CM | POA: Insufficient documentation

## 2015-10-14 LAB — URINALYSIS, ROUTINE W REFLEX MICROSCOPIC
BILIRUBIN URINE: NEGATIVE
GLUCOSE, UA: NEGATIVE mg/dL
HGB URINE DIPSTICK: NEGATIVE
Ketones, ur: NEGATIVE mg/dL
Leukocytes, UA: NEGATIVE
Nitrite: NEGATIVE
PH: 6.5 (ref 5.0–8.0)
Protein, ur: NEGATIVE mg/dL
SPECIFIC GRAVITY, URINE: 1.025 (ref 1.005–1.030)

## 2015-10-14 LAB — CBC
HEMATOCRIT: 34.9 % — AB (ref 39.0–52.0)
HEMOGLOBIN: 11.8 g/dL — AB (ref 13.0–17.0)
MCH: 29.9 pg (ref 26.0–34.0)
MCHC: 33.8 g/dL (ref 30.0–36.0)
MCV: 88.6 fL (ref 78.0–100.0)
Platelets: 222 10*3/uL (ref 150–400)
RBC: 3.94 MIL/uL — AB (ref 4.22–5.81)
RDW: 14.4 % (ref 11.5–15.5)
WBC: 5.2 10*3/uL (ref 4.0–10.5)

## 2015-10-14 LAB — COMPREHENSIVE METABOLIC PANEL
ALBUMIN: 3.8 g/dL (ref 3.5–5.0)
ALK PHOS: 42 U/L (ref 38–126)
ALT: 43 U/L (ref 17–63)
ANION GAP: 7 (ref 5–15)
AST: 16 U/L (ref 15–41)
BUN: 18 mg/dL (ref 6–20)
CHLORIDE: 101 mmol/L (ref 101–111)
CO2: 29 mmol/L (ref 22–32)
Calcium: 8.8 mg/dL — ABNORMAL LOW (ref 8.9–10.3)
Creatinine, Ser: 0.64 mg/dL (ref 0.61–1.24)
GFR calc non Af Amer: >60 mL/min (ref >60)
GLUCOSE: 111 mg/dL — AB (ref 65–99)
Potassium: 3.3 mmol/L — ABNORMAL LOW (ref 3.5–5.1)
SODIUM: 137 mmol/L (ref 135–145)
Total Bilirubin: 1 mg/dL (ref 0.3–1.2)
Total Protein: 6.1 g/dL — ABNORMAL LOW (ref 6.5–8.1)

## 2015-10-14 MED ORDER — METOCLOPRAMIDE HCL 10 MG PO TABS: 10.0000 mg | ORAL_TABLET | Freq: Four times a day (QID) | ORAL | Status: DC | PRN

## 2015-10-14 MED ORDER — SODIUM CHLORIDE 0.9 % IV BOLUS (SEPSIS)
1000.0000 mL | Freq: Once | INTRAVENOUS | Status: AC
  Administered 2015-10-14: 1000 mL via INTRAVENOUS

## 2015-10-14 MED ORDER — DEXAMETHASONE SODIUM PHOSPHATE 10 MG/ML IJ SOLN
10.0000 mg | Freq: Once | INTRAMUSCULAR | Status: AC
  Administered 2015-10-14: 10 mg via INTRAVENOUS
  Filled 2015-10-14: qty 1

## 2015-10-14 MED ORDER — PANTOPRAZOLE SODIUM 40 MG IV SOLR
40.0000 mg | Freq: Once | INTRAVENOUS | Status: AC
  Administered 2015-10-14: 40 mg via INTRAVENOUS
  Filled 2015-10-14: qty 40

## 2015-10-14 MED ORDER — DIPHENHYDRAMINE HCL 50 MG/ML IJ SOLN
25.0000 mg | Freq: Once | INTRAMUSCULAR | Status: AC
  Administered 2015-10-14: 25 mg via INTRAVENOUS
  Filled 2015-10-14: qty 1

## 2015-10-14 MED ORDER — HYDROMORPHONE HCL 1 MG/ML IJ SOLN
1.0000 mg | Freq: Once | INTRAMUSCULAR | Status: AC
  Administered 2015-10-14: 1 mg via INTRAVENOUS
  Filled 2015-10-14: qty 1

## 2015-10-14 MED ORDER — HEPARIN SOD (PORK) LOCK FLUSH 100 UNIT/ML IV SOLN
500.0000 [IU] | Freq: Once | INTRAVENOUS | Status: AC
  Administered 2015-10-14: 500 [IU]
  Filled 2015-10-14: qty 5

## 2015-10-14 MED ORDER — ONDANSETRON HCL 4 MG/2ML IJ SOLN
4.0000 mg | Freq: Once | INTRAMUSCULAR | Status: AC
  Administered 2015-10-14: 4 mg via INTRAVENOUS
  Filled 2015-10-14: qty 2

## 2015-10-14 MED ORDER — PROCHLORPERAZINE EDISYLATE 5 MG/ML IJ SOLN
10.0000 mg | Freq: Four times a day (QID) | INTRAMUSCULAR | Status: DC | PRN
  Administered 2015-10-14 (×2): 10 mg via INTRAVENOUS
  Filled 2015-10-14 (×2): qty 2

## 2015-10-14 NOTE — ED Notes (Signed)
Pt c/o increasing emesis and headache since chemo x 2 days ago.  Pain score 8/10.  Pt reports that he has been unable to hold down medications. + Eye pressure +Blurred Vision +Lightsensitivity  Hx of Burkitt lymphoma.  Pt reports lumbar puncture x  6 days.

## 2015-10-14 NOTE — ED Provider Notes (Signed)
CSN: [id number]     Arrival date & time 10/14/15  1610 History   First MD Initiated Contact with Patient 10/14/15 1628     Chief Complaint  Patient presents with  . Chemo Card   . Emesis  . Headache     (Consider location/radiation/quality/duration/timing/severity/associated sxs/prior Treatment) HPI Comments: 39 y.o. Male with history of Burkit's Lymphoma on active chemotherapy and also receiving intrathecal Methotrexate prophylacticaly presents for headache, nausea, vomiting.  Patient reports that he has had a severe, progressively headache over the last 24-48 hours.  He was just discharged from the hospital 2 days ago.  He reports that the headache is different than the headaches he usually gets but he feels like it is from the chemotherapy treatment.  He says he has not been able to hold down food or drink today.  He also says that light seems to make his headache worse and that his vision seems blurry in both eyes.   Past Medical History  Diagnosis Date  . EBV infection 2013    Patient notes that he had an EBV infection in 2013 that left him with chronic fatigue. She also notes that he had significant lymphadenopathy and thyroiditis at the time. He has been managing his symptoms with an alternative medicine practitioner in [place] and with other alternative medicines.  . Marijuana use, episodic   . Complication of anesthesia   . Burkitt lymphoma ([place]) 08/15/2015   Past Surgical History  Procedure Laterality Date  . Tonsillectomy    . Appendectomy    . Laparoscopy N/A 07/20/2015    Procedure: LAPAROSCOPY DIAGNOSTIC, MULTIPLE BIOPSIES, DRAINAGE OF ASCITES;  Surgeon: Fanny Skates, MD;  Location: [facility];  Service: General;  Laterality: N/A;   Family History  Problem Relation Age of Onset  . Heart failure Father    Social History  Substance Use Topics  . Smoking status: Never Smoker   . Smokeless tobacco: Never Used  . Alcohol Use: No    Review of Systems   Constitutional: Negative for fever, chills and fatigue.  HENT: Negative for congestion, rhinorrhea and sinus pressure.   Eyes: Positive for photophobia and visual disturbance. Negative for pain.  Respiratory: Negative for cough, chest tightness and shortness of breath.   Cardiovascular: Negative for chest pain and palpitations.  Gastrointestinal: Positive for nausea and vomiting. Negative for abdominal pain and diarrhea.  Genitourinary: Negative for dysuria, frequency, hematuria and decreased urine volume.  Musculoskeletal: Negative for myalgias and back pain.  Skin: Negative for rash.  Neurological: Positive for headaches. Negative for dizziness, syncope, weakness and numbness.  Hematological: Does not bruise/bleed easily.      Allergies  Levaquin and Sulfa antibiotics  Home Medications   Prior to Admission medications   Medication Sig Start Date End Date Taking? Authorizing Provider  Multiple Vitamin (MULTIVITAMIN WITH MINERALS) TABS tablet Take 1 tablet by mouth daily.   Yes Historical Provider, MD  oxycodone (OXY-IR) 5 MG capsule Take 1 capsule (5 mg total) by mouth every 6 (six) hours as needed. Patient taking differently: Take 5 mg by mouth every 6 (six) hours as needed for pain.  09/21/15  Yes Brunetta Genera, MD  oxyCODONE (OXYCONTIN) 10 mg 12 hr tablet Take 1 tablet (10 mg total) by mouth every 12 (twelve) hours. 09/21/15  Yes Brunetta Genera, MD  polyethylene glycol (MIRALAX / GLYCOLAX) packet Take 17 g by mouth daily. 08/19/15  Yes [place], MD  PRESCRIPTION MEDICATION Patient receives his chemo treatments at the  [facility] with Dr. Irene Limbo. He received Neulasta 6mg  on 08/21/15. He is on a 21 day cycle of EPOCH-R that he receives during hospitalization.   Yes Historical Provider, MD  senna-docusate (SENOKOT-S) 8.6-50 MG tablet Take 2 tablets by mouth 2 (two) times daily. 09/21/15  Yes Brunetta Genera, MD  lansoprazole  (PREVACID) 30 MG capsule Take 1 capsule (30 mg total) by mouth daily at 12 noon. Patient not taking: Reported on 10/14/2015 10/11/15   Brunetta Genera, MD  nystatin (MYCOSTATIN) 100000 UNIT/ML suspension Use as directed 5 mLs (500,000 Units total) in the mouth or throat 4 (four) times daily. For 10 days Patient not taking: Reported on 10/14/2015 10/11/15   Brunetta Genera, MD  ondansetron (ZOFRAN) 8 MG tablet Take 1 tablet (8 mg total) by mouth every 8 (eight) hours as needed for nausea or vomiting. Patient not taking: Reported on 09/21/2015 08/03/15   Brunetta Genera, MD  sodium bicarbonate/sodium chloride SOLN 1 application by Mouth Rinse route 4 (four) times daily. Patient not taking: Reported on 10/14/2015 10/11/15   Brunetta Genera, MD   BP 106/65 mmHg  Pulse 53  Temp(Src) 97.9 F (36.6 C) (Oral)  Resp 16  SpO2 99% Physical Exam  Constitutional: He is oriented to person, place, and time. He appears well-developed and well-nourished. He appears distressed (appears uncomfortable).  HENT:  Head: Normocephalic and atraumatic.  Right Ear: External ear normal.  Left Ear: External ear normal.  Mouth/Throat: Oropharynx is clear and moist. No oropharyngeal exudate.  Eyes: EOM are normal. Pupils are equal, round, and reactive to light.  Neck: Normal range of motion. Neck supple.  Cardiovascular: Normal rate, regular rhythm, normal heart sounds and intact distal pulses.   No murmur heard. Pulmonary/Chest: Effort normal. No respiratory distress. He has no wheezes. He has no rales.  Abdominal: Soft. He exhibits no distension. There is no tenderness.  Musculoskeletal: He exhibits no edema.  Neurological: He is alert and oriented to person, place, and time. He has normal strength. No cranial nerve deficit or sensory deficit. He exhibits normal muscle tone. Coordination normal.  Skin: Skin is warm and dry. No rash noted. He is not diaphoretic.  Vitals reviewed.   ED Course   Procedures (including critical care time) Labs Review Labs Reviewed  COMPREHENSIVE METABOLIC PANEL - Abnormal; Notable for the following:    Potassium 3.3 (*)    Glucose, Bld 111 (*)    Calcium 8.8 (*)    Total Protein 6.1 (*)    All other components within normal limits  CBC - Abnormal; Notable for the following:    RBC 3.94 (*)    Hemoglobin 11.8 (*)    HCT 34.9 (*)    All other components within normal limits  URINALYSIS, ROUTINE W REFLEX MICROSCOPIC (NOT AT [facility]) - Abnormal; Notable for the following:    Color, Urine AMBER (*)    APPearance HAZY (*)    All other components within normal limits    Imaging Review No results found. I have personally reviewed and evaluated these images and lab results as part of my medical decision-making.   EKG Interpretation None      MDM  Patien twas seen and evaluated in stable condition.  Neuro exam unremarkable.  History reviewed in computer.  Fluids, compazine, decadron, dilaudid, benadryl ordered. Discussed with oncologist on call who recommended CT head with and without contrast.  When this was discussed with patient he refused any imaging  saying he just needs his symptoms controlled.  He had improvement with initial treatment and then was also given Dilaudid, benadryl, zofran.   9:09 PM Patient reports feeling well and requesting discharge.  Patient turned down CT at this time although he understood this was a recommendation from oncology.  He was discharged home in stable condition with strict return precautions, prescription for Reglan, and instruction to follow up with oncology outpatient.  Final diagnoses:  None    1. Headache    Harvel Quale, MD 10/14/15 2127

## 2015-10-14 NOTE — Discharge Instructions (Signed)
You were seen today for your headache.  Follow up with your oncologist.  Return with worsening symptoms since at that time you may require the head CT and possibly a blood patch.  Rest at home.  General Headache Without Cause A headache is pain or discomfort felt around the head or neck area. The specific cause of a headache may not be found. There are many causes and types of headaches. A few common ones are:  Tension headaches.  Migraine headaches.  Cluster headaches.  Chronic daily headaches. HOME CARE INSTRUCTIONS  Watch your condition for any changes. Take these steps to help with your condition: Managing Pain  Take over-the-counter and prescription medicines only as told by your health care provider.  Lie down in a dark, quiet room when you have a headache.  If directed, apply ice to the head and neck area:  Put ice in a plastic bag.  Place a towel between your skin and the bag.  Leave the ice on for 20 minutes, 2-3 times per day.  Use a heating pad or hot shower to apply heat to the head and neck area as told by your health care provider.  Keep lights dim if bright lights bother you or make your headaches worse. Eating and Drinking  Eat meals on a regular schedule.  Limit alcohol use.  Decrease the amount of caffeine you drink, or stop drinking caffeine. General Instructions  Keep all follow-up visits as told by your health care provider. This is important.  Keep a headache journal to help find out what may trigger your headaches. For example, write down:  What you eat and drink.  How much sleep you get.  Any change to your diet or medicines.  Try massage or other relaxation techniques.  Limit stress.  Sit up straight, and do not tense your muscles.  Do not use tobacco products, including cigarettes, chewing tobacco, or e-cigarettes. If you need help quitting, ask your health care provider.  Exercise regularly as told by your health care  provider.  Sleep on a regular schedule. Get 7-9 hours of sleep, or the amount recommended by your health care provider. SEEK MEDICAL CARE IF:   Your symptoms are not helped by medicine.  You have a headache that is different from the usual headache.  You have nausea or you vomit.  You have a fever. SEEK IMMEDIATE MEDICAL CARE IF:   Your headache becomes severe.  You have repeated vomiting.  You have a stiff neck.  You have a loss of vision.  You have problems with speech.  You have pain in the eye or ear.  You have muscular weakness or loss of muscle control.  You lose your balance or have trouble walking.  You feel faint or pass out.  You have confusion.   This information is not intended to replace advice given to you by your health care provider. Make sure you discuss any questions you have with your health care provider.   Document Released: 10/27/2005 Document Revised: 07/18/2015 Document Reviewed: 02/19/2015 Elsevier Interactive Patient Education [employer].

## 2015-10-15 ENCOUNTER — Ambulatory Visit (HOSPITAL_BASED_OUTPATIENT_CLINIC_OR_DEPARTMENT_OTHER): Payer: BLUE CROSS/BLUE SHIELD

## 2015-10-15 VITALS — BP 115/60 | HR 64 | Temp 98.8°F

## 2015-10-15 DIAGNOSIS — C8370 Burkitt lymphoma, unspecified site: Secondary | ICD-10-CM | POA: Diagnosis not present

## 2015-10-15 DIAGNOSIS — C8378 Burkitt lymphoma, lymph nodes of multiple sites: Secondary | ICD-10-CM

## 2015-10-15 MED ORDER — PEGFILGRASTIM INJECTION 6 MG/0.6ML ~~LOC~~
6.0000 mg | PREFILLED_SYRINGE | Freq: Once | SUBCUTANEOUS | Status: AC
  Administered 2015-10-15: 6 mg via SUBCUTANEOUS
  Filled 2015-10-15: qty 0.6

## 2015-10-23 ENCOUNTER — Emergency Department (HOSPITAL_COMMUNITY): Payer: BLUE CROSS/BLUE SHIELD

## 2015-10-23 ENCOUNTER — Inpatient Hospital Stay (HOSPITAL_COMMUNITY)
Admission: EM | Admit: 2015-10-23 | Discharge: 2015-10-24 | DRG: 389 | Disposition: A | Discharge status: Home / Self Care | Payer: BLUE CROSS/BLUE SHIELD | Attending: Internal Medicine | Admitting: Internal Medicine

## 2015-10-23 ENCOUNTER — Encounter (HOSPITAL_COMMUNITY): Payer: Self-pay | Admitting: Emergency Medicine

## 2015-10-23 DIAGNOSIS — Z881 Allergy status to other antibiotic agents status: Secondary | ICD-10-CM

## 2015-10-23 DIAGNOSIS — Z882 Allergy status to sulfonamides status: Secondary | ICD-10-CM

## 2015-10-23 DIAGNOSIS — Z8249 Family history of ischemic heart disease and other diseases of the circulatory system: Secondary | ICD-10-CM

## 2015-10-23 DIAGNOSIS — D72829 Elevated white blood cell count, unspecified: Secondary | ICD-10-CM | POA: Diagnosis present

## 2015-10-23 DIAGNOSIS — C8378 Burkitt lymphoma, lymph nodes of multiple sites: Secondary | ICD-10-CM | POA: Diagnosis present

## 2015-10-23 DIAGNOSIS — Z79899 Other long term (current) drug therapy: Secondary | ICD-10-CM | POA: Diagnosis not present

## 2015-10-23 DIAGNOSIS — K5900 Constipation, unspecified: Secondary | ICD-10-CM | POA: Diagnosis not present

## 2015-10-23 DIAGNOSIS — Z79891 Long term (current) use of opiate analgesic: Secondary | ICD-10-CM | POA: Diagnosis not present

## 2015-10-23 DIAGNOSIS — R1084 Generalized abdominal pain: Secondary | ICD-10-CM | POA: Diagnosis present

## 2015-10-23 DIAGNOSIS — Z9049 Acquired absence of other specified parts of digestive tract: Secondary | ICD-10-CM

## 2015-10-23 DIAGNOSIS — C8370 Burkitt lymphoma, unspecified site: Secondary | ICD-10-CM | POA: Diagnosis not present

## 2015-10-23 DIAGNOSIS — B370 Candidal stomatitis: Secondary | ICD-10-CM | POA: Diagnosis present

## 2015-10-23 DIAGNOSIS — K5660 Unspecified intestinal obstruction: Principal | ICD-10-CM | POA: Diagnosis present

## 2015-10-23 DIAGNOSIS — K56609 Unspecified intestinal obstruction, unspecified as to partial versus complete obstruction: Secondary | ICD-10-CM | POA: Diagnosis present

## 2015-10-23 DIAGNOSIS — K5669 Other intestinal obstruction: Secondary | ICD-10-CM | POA: Diagnosis not present

## 2015-10-23 LAB — CBC WITH DIFFERENTIAL/PLATELET
BASOS ABS: 0.3 10*3/uL — AB (ref 0.0–0.1)
Basophils Relative: 1 %
EOS ABS: 0 10*3/uL (ref 0.0–0.7)
Eosinophils Relative: 0 %
HCT: 35.1 % — ABNORMAL LOW (ref 39.0–52.0)
HEMOGLOBIN: 11.4 g/dL — AB (ref 13.0–17.0)
LYMPHS PCT: 6 %
Lymphs Abs: 1.7 10*3/uL (ref 0.7–4.0)
MCH: 30.1 pg (ref 26.0–34.0)
MCHC: 32.5 g/dL (ref 30.0–36.0)
MCV: 92.6 fL (ref 78.0–100.0)
MONOS PCT: 8 %
Monocytes Absolute: 2.3 10*3/uL — ABNORMAL HIGH (ref 0.1–1.0)
NEUTROS ABS: 24.6 10*3/uL — AB (ref 1.7–7.7)
NEUTROS PCT: 85 %
Platelets: 188 10*3/uL (ref 150–400)
RBC: 3.79 MIL/uL — AB (ref 4.22–5.81)
RDW: 15.4 % (ref 11.5–15.5)
WBC: 28.9 10*3/uL — AB (ref 4.0–10.5)

## 2015-10-23 LAB — COMPREHENSIVE METABOLIC PANEL
ALT: 18 U/L (ref 17–63)
ANION GAP: 10 (ref 5–15)
AST: 20 U/L (ref 15–41)
Albumin: 4.2 g/dL (ref 3.5–5.0)
Alkaline Phosphatase: 96 U/L (ref 38–126)
BILIRUBIN TOTAL: 0.5 mg/dL (ref 0.3–1.2)
BUN: 12 mg/dL (ref 6–20)
CHLORIDE: 104 mmol/L (ref 101–111)
CO2: 27 mmol/L (ref 22–32)
Calcium: 9.3 mg/dL (ref 8.9–10.3)
Creatinine, Ser: 0.78 mg/dL (ref 0.61–1.24)
Glucose, Bld: 122 mg/dL — ABNORMAL HIGH (ref 65–99)
POTASSIUM: 3.5 mmol/L (ref 3.5–5.1)
Sodium: 141 mmol/L (ref 135–145)
TOTAL PROTEIN: 6.8 g/dL (ref 6.5–8.1)

## 2015-10-23 LAB — LIPASE, BLOOD: LIPASE: 23 U/L (ref 11–51)

## 2015-10-23 MED ORDER — OXYCODONE HCL ER 10 MG PO T12A
10.0000 mg | EXTENDED_RELEASE_TABLET | Freq: Two times a day (BID) | ORAL | Status: DC
  Administered 2015-10-23: 10 mg via ORAL
  Filled 2015-10-23 (×2): qty 1

## 2015-10-23 MED ORDER — ONDANSETRON HCL 4 MG/2ML IJ SOLN
4.0000 mg | Freq: Once | INTRAMUSCULAR | Status: AC
  Administered 2015-10-23: 4 mg via INTRAVENOUS
  Filled 2015-10-23: qty 2

## 2015-10-23 MED ORDER — HYDROMORPHONE HCL 1 MG/ML IJ SOLN
1.0000 mg | Freq: Once | INTRAMUSCULAR | Status: AC
  Administered 2015-10-23: 1 mg via INTRAVENOUS

## 2015-10-23 MED ORDER — HYDROMORPHONE HCL 1 MG/ML IJ SOLN
1.0000 mg | Freq: Once | INTRAMUSCULAR | Status: AC
  Administered 2015-10-23: 1 mg via INTRAVENOUS
  Filled 2015-10-23: qty 1

## 2015-10-23 MED ORDER — HYDROMORPHONE HCL 1 MG/ML IJ SOLN
1.0000 mg | INTRAMUSCULAR | Status: DC | PRN
  Administered 2015-10-23: 1 mg via INTRAVENOUS
  Filled 2015-10-23: qty 1

## 2015-10-23 MED ORDER — HEPARIN SODIUM (PORCINE) 5000 UNIT/ML IJ SOLN
5000.0000 [IU] | Freq: Three times a day (TID) | INTRAMUSCULAR | Status: DC
  Administered 2015-10-23: 5000 [IU] via SUBCUTANEOUS
  Filled 2015-10-23 (×5): qty 1

## 2015-10-23 MED ORDER — IOHEXOL 300 MG/ML  SOLN
100.0000 mL | Freq: Once | INTRAMUSCULAR | Status: AC | PRN
  Administered 2015-10-23: 100 mL via INTRAVENOUS

## 2015-10-23 MED ORDER — POLYETHYLENE GLYCOL 3350 17 G PO PACK
17.0000 g | PACK | Freq: Every day | ORAL | Status: DC
  Filled 2015-10-23 (×2): qty 1

## 2015-10-23 MED ORDER — HYDROMORPHONE HCL 1 MG/ML IJ SOLN
INTRAMUSCULAR | Status: AC
  Administered 2015-10-23: 1 mg via INTRAVENOUS
  Filled 2015-10-23: qty 1

## 2015-10-23 MED ORDER — SENNOSIDES-DOCUSATE SODIUM 8.6-50 MG PO TABS
2.0000 | ORAL_TABLET | Freq: Two times a day (BID) | ORAL | Status: DC
  Administered 2015-10-23: 2 via ORAL
  Filled 2015-10-23 (×2): qty 2

## 2015-10-23 MED ORDER — SODIUM CHLORIDE 0.9 % IV SOLN
INTRAVENOUS | Status: DC
  Administered 2015-10-23: 06:00:00 via INTRAVENOUS

## 2015-10-23 MED ORDER — OXYCODONE HCL 5 MG PO TABS
5.0000 mg | ORAL_TABLET | Freq: Four times a day (QID) | ORAL | Status: DC | PRN
  Administered 2015-10-23: 5 mg via ORAL
  Filled 2015-10-23: qty 1

## 2015-10-23 MED ORDER — PANTOPRAZOLE SODIUM 40 MG PO TBEC
40.0000 mg | DELAYED_RELEASE_TABLET | Freq: Every day | ORAL | Status: DC
  Administered 2015-10-23: 40 mg via ORAL
  Filled 2015-10-23: qty 1

## 2015-10-23 MED ORDER — IOHEXOL 300 MG/ML  SOLN
50.0000 mL | Freq: Once | INTRAMUSCULAR | Status: AC | PRN
  Administered 2015-10-23: 50 mL via ORAL

## 2015-10-23 MED ORDER — SODIUM CHLORIDE 0.9 % IV BOLUS (SEPSIS)
1000.0000 mL | Freq: Once | INTRAVENOUS | Status: AC
  Administered 2015-10-23: 1000 mL via INTRAVENOUS

## 2015-10-23 MED ORDER — METOCLOPRAMIDE HCL 5 MG/ML IJ SOLN
10.0000 mg | Freq: Once | INTRAMUSCULAR | Status: AC
  Administered 2015-10-23: 10 mg via INTRAVENOUS
  Filled 2015-10-23: qty 2

## 2015-10-23 MED ORDER — DIPHENHYDRAMINE HCL 50 MG/ML IJ SOLN
25.0000 mg | Freq: Once | INTRAMUSCULAR | Status: AC
  Administered 2015-10-23: 25 mg via INTRAVENOUS
  Filled 2015-10-23: qty 1

## 2015-10-23 MED ORDER — ONDANSETRON HCL 8 MG PO TABS: 8.0000 mg | ORAL_TABLET | Freq: Three times a day (TID) | ORAL | Status: DC | PRN

## 2015-10-23 NOTE — Plan of Care (Signed)
[facility] PLAN OF CARE NOTE  Patient: Donald [name]   [id number]  PCP: Drema Pry, DO   DOB: Dec 23, 1975  DOA: 10/23/2015    DOS: 10/23/2015   Plan of care: Patient was admitted earlier in the morning by my colleague Dr. Alcario Drought. Reviewed H&P as well as assessment and plan. Agreement with the same. Important changes are listed below. CT scan of the abdomen was showing small bowel obstruction without any perforation, general surgery was consulted. Appreciate their input. Patient was initially requesting to leave the hospital as he was feeling much better after having a bowel movement in the ER. On my examination the patient did not have any significant bowel sounds. Recommended patient that if he is feeling better we can advance the diet and a lot with an x-ray in the morning and if he does not have any further episodes of nausea vomiting abdominal tenderness and he continues to have regular bowel movement as well as passing gas and x-ray shows no evidence of obstruction he can be discharged home. Patient agreed with the plan. Also discussed with the patient's hematologist.  Author: Berle Mull, MD [facility]  Pager: [phone] 10/23/2015 5:20 PM   If 7PM-7AM, please contact night-coverage at [email or web address], password [email or web address]

## 2015-10-23 NOTE — H&P (Signed)
[facility] History and Physical  Donald [name] [id number] DOB: [birth year] DOA: 10/23/2015  Referring physician: EDP PCP: Drema Pry, DO   Chief Complaint: Abdominal pain   HPI: Donald [name] is a 39 y.o. male with h/o Burkitt's lymphoma, he is s/p cycle 4 of EPOCH-R discharged from hospital on 12/3, got neulasta on 12/5.  His disease is believed to have responded to chemo with PET scan done on October 12th showing complete metabolic response.  The patient, who has been having severe abdominal pain episodically since his diagnosis in September, presents to the ED today with another episode of severe, cramping abdominal pain, worse with movement and onset after eating.  This is located in his central abdomen.  No diarrhea nor constipation.  No fever.  Review of Systems: Systems reviewed.  As above, otherwise negative  Past Medical History  Diagnosis Date  . EBV infection 2013    Patient notes that he had an EBV infection in 2013 that left him with chronic fatigue. She also notes that he had significant lymphadenopathy and thyroiditis at the time. He has been managing his symptoms with an alternative medicine practitioner in [place] and with other alternative medicines.  . Marijuana use, episodic   . Complication of anesthesia   . Burkitt lymphoma ([place]) 08/15/2015   Past Surgical History  Procedure Laterality Date  . Tonsillectomy    . Appendectomy    . Laparoscopy N/A 07/20/2015    Procedure: LAPAROSCOPY DIAGNOSTIC, MULTIPLE BIOPSIES, DRAINAGE OF ASCITES;  Surgeon: Fanny Skates, MD;  Location: [facility];  Service: General;  Laterality: N/A;   Social History:  reports that he has never smoked. He has never used smokeless tobacco. He reports that he uses illicit drugs (Marijuana) about once per week. He reports that he does not drink alcohol.  Allergies  Allergen Reactions  . Levaquin [Levofloxacin] Other (See Comments)    Patient prefers not to take fluoroquinolones. He  denies having any allergic reaction to them.  . Sulfa Antibiotics Other (See Comments)    Unknown, Patient was a child and does not remember the reaction    Family History  Problem Relation Age of Onset  . Heart failure Father      Prior to Admission medications   Medication Sig Start Date End Date Taking? Authorizing Provider  ondansetron (ZOFRAN) 8 MG tablet Take 1 tablet (8 mg total) by mouth every 8 (eight) hours as needed for nausea or vomiting. 08/03/15  Yes Brunetta Genera, MD  oxycodone (OXY-IR) 5 MG capsule Take 1 capsule (5 mg total) by mouth every 6 (six) hours as needed. Patient taking differently: Take 5 mg by mouth every 6 (six) hours as needed for pain.  09/21/15  Yes Brunetta Genera, MD  oxyCODONE (OXYCONTIN) 10 mg 12 hr tablet Take 1 tablet (10 mg total) by mouth every 12 (twelve) hours. 09/21/15  Yes Brunetta Genera, MD  polyethylene glycol (MIRALAX / GLYCOLAX) packet Take 17 g by mouth daily. 08/19/15  Yes [place], MD  PRESCRIPTION MEDICATION Patient receives his chemo treatments at the [facility] with Dr. Irene Limbo. He received Neulasta 6mg  on 08/21/15. He is on a 21 day cycle of EPOCH-R that he receives during hospitalization.   Yes Historical Provider, MD  lansoprazole (PREVACID) 30 MG capsule Take 1 capsule (30 mg total) by mouth daily at 12 noon. Patient not taking: Reported on 10/14/2015 10/11/15   Brunetta Genera, MD  metoCLOPramide (REGLAN) 10 MG tablet  Take 1 tablet (10 mg total) by mouth every 6 (six) hours as needed for nausea. Patient not taking: Reported on 10/23/2015 10/14/15   Harvel Quale, MD  nystatin (MYCOSTATIN) 100000 UNIT/ML suspension Use as directed 5 mLs (500,000 Units total) in the mouth or throat 4 (four) times daily. For 10 days Patient not taking: Reported on 10/14/2015 10/11/15   Brunetta Genera, MD  senna-docusate (SENOKOT-S) 8.6-50 MG tablet Take 2 tablets by mouth 2 (two) times  daily. Patient not taking: Reported on 10/23/2015 09/21/15   Brunetta Genera, MD  sodium bicarbonate/sodium chloride SOLN 1 application by Mouth Rinse route 4 (four) times daily. Patient not taking: Reported on 10/14/2015 10/11/15   Brunetta Genera, MD   Physical Exam: Filed Vitals:   10/23/15 0340 10/23/15 0435  BP: 128/83 137/72  Pulse: 105 83  Temp: 98.7 F (37.1 C)   Resp: 22 21    BP 137/72 mmHg  Pulse 83  Temp(Src) 98.7 F (37.1 C) (Oral)  Resp 21  SpO2 100%  General Appearance:    Alert, oriented, Severe abdominal pain, appears stated age  Head:    Normocephalic, atraumatic  Eyes:    PERRL, EOMI, sclera non-icteric        Nose:   Nares without drainage or epistaxis. Mucosa, turbinates normal  Throat:   Moist mucous membranes. Oropharynx without erythema or exudate.  Neck:   Supple. No carotid bruits.  No thyromegaly.  No lymphadenopathy.   Back:     No CVA tenderness, no spinal tenderness  Lungs:     Clear to auscultation bilaterally, without wheezes, rhonchi or rales  Chest wall:    No tenderness to palpitation  Heart:    Regular rate and rhythm without murmurs, gallops, rubs  Abdomen:     Soft, non-tender, nondistended, normal bowel sounds, no organomegaly  Genitalia:    deferred  Rectal:    deferred  Extremities:   No clubbing, cyanosis or edema.  Pulses:   2+ and symmetric all extremities  Skin:   Skin color, texture, turgor normal, no rashes or lesions  Lymph nodes:   Cervical, supraclavicular, and axillary nodes normal  Neurologic:   CNII-XII intact. Normal strength, sensation and reflexes      throughout    Labs on Admission:  Basic Metabolic Panel:  Recent Labs Lab 10/23/15 0405  NA 141  K 3.5  CL 104  CO2 27  GLUCOSE 122*  BUN 12  CREATININE 0.78  CALCIUM 9.3   Liver Function Tests:  Recent Labs Lab 10/23/15 0405  AST 20  ALT 18  ALKPHOS 96  BILITOT 0.5  PROT 6.8  ALBUMIN 4.2    Recent Labs Lab 10/23/15 0405  LIPASE 23    No results for input(s): AMMONIA in the last 168 hours. CBC:  Recent Labs Lab 10/23/15 0405  WBC 28.9*  NEUTROABS 24.6*  HGB 11.4*  HCT 35.1*  MCV 92.6  PLT 188   Cardiac Enzymes: No results for input(s): CKTOTAL, CKMB, CKMBINDEX, TROPONINI in the last 168 hours.  BNP (last 3 results) No results for input(s): PROBNP in the last 8760 hours. CBG: No results for input(s): GLUCAP in the last 168 hours.  Radiological Exams on Admission: Dg Abd Acute W/chest  10/23/2015  CLINICAL DATA:  Acute onset of vomiting and generalized abdominal cramping. Initial encounter. EXAM: DG ABDOMEN ACUTE W/ 1V CHEST COMPARISON:  Chest radiograph performed 07/22/2015, and abdominal radiograph performed 09/10/2015 FINDINGS: The lungs are well-aerated and clear. There  is no evidence of focal opacification, pleural effusion or pneumothorax. The cardiomediastinal silhouette is within normal limits. A right-sided chest port is noted ending about the distal SVC. The visualized bowel gas pattern is unremarkable. Scattered stool and air are seen within the colon; there is no evidence of small bowel dilatation to suggest obstruction. No free intra-abdominal air is identified on the provided upright view. No acute osseous abnormalities are seen; the sacroiliac joints are unremarkable in appearance. IMPRESSION: 1. Unremarkable bowel gas pattern; no free intra-abdominal air seen. Moderate amount of stool noted in the colon. 2. No acute cardiopulmonary process seen. Electronically Signed   By: Garald Balding M.D.   On: 10/23/2015 04:42    EKG: Independently reviewed.  Assessment/Plan Principal Problem:   Abdominal pain Active Problems:   Burkitt lymphoma of lymph nodes of multiple regions (HCC)   Leukocytosis   1. Abdominal pain - Unclear etiology.  While Burkitt's lymphoma typically does have an abdominal presentation, patient did have complete metabolic response on PET scan back in October, so unclear if this  is still causing the abdominal symptoms. 1. CT scan abd/pelvis ordered 2. Symptomatic control of pain with dilaudid 3. IVF 2. Leukocytosis - unclear significance, this could be secondary to the neulasta that patient just received 7 days ago.  No other SIRS criteria at this point.  Abd CT scan pending. 3. Burkitt lymphoma - s/p 4 cycles of EPOCH-R, disease had total metabolic response as of last PET scan in October.  Consult put in to Epic for oncology, as per routine when admitting a chemo patient.  Code Status: Full  Family Communication: No family in room Disposition Plan: Admit to inpatient   Time spent: 48 min  GARDNER, JARED M. [facility] Pager [phone]  If 7AM-7PM, please contact the day team taking care of the patient [email or web address] Password [facility] 10/23/2015, 5:47 AM

## 2015-10-23 NOTE — ED Notes (Signed)
Pt alert and oriented x4. Respirations even and unlabored, bilateral symmetrical rise and fall of chest. Skin warm and dry. In no acute distress. Denies needs.   

## 2015-10-23 NOTE — Consult Note (Signed)
Reason for Consult:SBO Referring Physician: Dr. Berle Mull   HPI: Donald [name] is a 39 year old male known to our service from September which time he had a diagnostic laparoscopy, excisional biopsy by Dr. Dalbert Batman due to intra-abdominal lymphadenopathy, omental infiltration and ascites.  Pathology proved B cell lymphoma.  Since then, he has completed 4 cycles of chemotherapy 10/13/15.  PET scan showed complete metabolic response in October.  He was started on Neulasta on 12/5.    He reports intermittent generalized abdominal pain since his surgery in September.   Aggravated by oral intake.  Usually occurs weekly.  At times, resolves on its own.  This episode started last night.  Vomited this AM.  Had a runny bowel movement.  Now pain has improved, but still remains sore.  Denies close ill contacts.  Ct shows dilated small bowel with a transition focal point at the RLQ questioning SBO, mesenteric edema.  We have therefore been asked to evaluate.    Past Medical History  Diagnosis Date  . EBV infection 2013    Patient notes that he had an EBV infection in 2013 that left him with chronic fatigue. She also notes that he had significant lymphadenopathy and thyroiditis at the time. He has been managing his symptoms with an alternative medicine practitioner in [place] and with other alternative medicines.  . Marijuana use, episodic   . Complication of anesthesia   . Burkitt lymphoma ([place]) 08/15/2015    Past Surgical History  Procedure Laterality Date  . Tonsillectomy    . Appendectomy    . Laparoscopy N/A 07/20/2015    Procedure: LAPAROSCOPY DIAGNOSTIC, MULTIPLE BIOPSIES, DRAINAGE OF ASCITES;  Surgeon: Fanny Skates, MD;  Location: [facility];  Service: General;  Laterality: N/A;    Family History  Problem Relation Age of Onset  . Heart failure Father     Social History:  reports that he has never smoked. He has never used smokeless tobacco. He reports that he uses illicit drugs (Marijuana)  about once per week. He reports that he does not drink alcohol.  Allergies:  Allergies  Allergen Reactions  . Levaquin [Levofloxacin] Other (See Comments)    Patient prefers not to take fluoroquinolones. He denies having any allergic reaction to them.  . Sulfa Antibiotics Other (See Comments)    Unknown, Patient was a child and does not remember the reaction    Medications:  Scheduled Meds: . heparin  5,000 Units Subcutaneous 3 times per day  . oxyCODONE  10 mg Oral Q12H  . pantoprazole  40 mg Oral Daily  . polyethylene glycol  17 g Oral Daily   Continuous Infusions: . sodium chloride 125 mL/hr at 10/23/15 0617   PRN Meds:.HYDROmorphone (DILAUDID) injection   Results for orders placed or performed during the hospital encounter of 10/23/15 (from the past 48 hour(s))  Comprehensive metabolic panel     Status: Abnormal   Collection Time: 10/23/15  4:05 AM  Result Value Ref Range   Sodium 141 135 - 145 mmol/L   Potassium 3.5 3.5 - 5.1 mmol/L   Chloride 104 101 - 111 mmol/L   CO2 27 22 - 32 mmol/L   Glucose, Bld 122 (H) 65 - 99 mg/dL   BUN 12 6 - 20 mg/dL   Creatinine, Ser 0.78 0.61 - 1.24 mg/dL   Calcium 9.3 8.9 - 10.3 mg/dL   Total Protein 6.8 6.5 - 8.1 g/dL   Albumin 4.2 3.5 - 5.0 g/dL   AST 20 15 - 41  U/L   ALT 18 17 - 63 U/L   Alkaline Phosphatase 96 38 - 126 U/L   Total Bilirubin 0.5 0.3 - 1.2 mg/dL   GFR calc non Af Amer >60 >60 mL/min   GFR calc Af Amer >60 >60 mL/min    Comment: (NOTE) The eGFR has been calculated using the CKD EPI equation. This calculation has not been validated in all clinical situations. eGFR's persistently <60 mL/min signify possible Chronic Kidney Disease.    Anion gap 10 5 - 15  Lipase, blood     Status: None   Collection Time: 10/23/15  4:05 AM  Result Value Ref Range   Lipase 23 11 - 51 U/L  CBC with Differential/Platelet     Status: Abnormal   Collection Time: 10/23/15  4:05 AM  Result Value Ref Range   WBC 28.9 (H) 4.0 - 10.5  K/uL    Comment: REPEATED TO VERIFY   RBC 3.79 (L) 4.22 - 5.81 MIL/uL   Hemoglobin 11.4 (L) 13.0 - 17.0 g/dL   HCT 35.1 (L) 39.0 - 52.0 %   MCV 92.6 78.0 - 100.0 fL   MCH 30.1 26.0 - 34.0 pg   MCHC 32.5 30.0 - 36.0 g/dL   RDW 15.4 11.5 - 15.5 %   Platelets 188 150 - 400 K/uL   Neutrophils Relative % 85 %   Lymphocytes Relative 6 %   Monocytes Relative 8 %   Eosinophils Relative 0 %   Basophils Relative 1 %   Neutro Abs 24.6 (H) 1.7 - 7.7 K/uL   Lymphs Abs 1.7 0.7 - 4.0 K/uL   Monocytes Absolute 2.3 (H) 0.1 - 1.0 K/uL   Eosinophils Absolute 0.0 0.0 - 0.7 K/uL   Basophils Absolute 0.3 (H) 0.0 - 0.1 K/uL   WBC Morphology      MODERATE LEFT SHIFT (>5% METAS AND MYELOS,OCC PRO NOTED)    Comment: DOHLE BODIES    Ct Abdomen Pelvis W Contrast  10/23/2015  CLINICAL DATA:  Acute onset of severe cramping generalized abdominal pain vomiting and nausea. Leukocytosis. Initial encounter. EXAM: CT ABDOMEN AND PELVIS WITH CONTRAST TECHNIQUE: Multidetector CT imaging of the abdomen and pelvis was performed using the standard protocol following bolus administration of intravenous contrast. CONTRAST:  152m OMNIPAQUE IOHEXOL 300 MG/ML  SOLN COMPARISON:  CT of the abdomen and pelvis performed 08/26/2015, and abdominal ultrasound performed 10/11/2015 FINDINGS: The visualized lung bases are clear. The liver and spleen are unremarkable in appearance. The gallbladder is within normal limits. The pancreas and adrenal glands are unremarkable. A tiny 2 mm nonobstructing stone is noted at the lower pole of the right kidney. The kidneys are otherwise unremarkable in appearance. There is no evidence of hydronephrosis. No obstructing ureteral stones are seen. No perinephric stranding is appreciated. The stomach is partially filled with contrast and grossly unremarkable. No acute vascular abnormalities are seen. There is dilatation of small bowel loops up to 3.8 cm in maximal diameter, with a focal transition point at the  right lower quadrant, possibly reflecting an adhesion. Mild associated mesenteric edema is noted. There is decompression of more distal small bowel. A small amount of free fluid is noted tracking along the left paracolic gutter and within the pelvis. The patient is status post appendectomy. The colon is unremarkable in appearance. The bladder is mildly distended and grossly unremarkable. The prostate remains normal in size. No inguinal lymphadenopathy is seen. No acute osseous abnormalities are identified. IMPRESSION: 1. Dilatation of small-bowel loops to 3.8 cm  in maximal diameter, with a focal transition point at the right lower quadrant, raising concern for small bowel obstruction due to an adhesion. Mild associated mesenteric edema noted. Small amount of free fluid tracks along the left paracolic gutter and within the pelvis. 2. Tiny 2 mm nonobstructing stone at the lower pole of the right kidney. Electronically Signed   By: Garald Balding M.D.   On: 10/23/2015 06:21   Dg Abd Acute W/chest  10/23/2015  CLINICAL DATA:  Acute onset of vomiting and generalized abdominal cramping. Initial encounter. EXAM: DG ABDOMEN ACUTE W/ 1V CHEST COMPARISON:  Chest radiograph performed 07/22/2015, and abdominal radiograph performed 09/10/2015 FINDINGS: The lungs are well-aerated and clear. There is no evidence of focal opacification, pleural effusion or pneumothorax. The cardiomediastinal silhouette is within normal limits. A right-sided chest port is noted ending about the distal SVC. The visualized bowel gas pattern is unremarkable. Scattered stool and air are seen within the colon; there is no evidence of small bowel dilatation to suggest obstruction. No free intra-abdominal air is identified on the provided upright view. No acute osseous abnormalities are seen; the sacroiliac joints are unremarkable in appearance. IMPRESSION: 1. Unremarkable bowel gas pattern; no free intra-abdominal air seen. Moderate amount of stool  noted in the colon. 2. No acute cardiopulmonary process seen. Electronically Signed   By: Garald Balding M.D.   On: 10/23/2015 04:42    Review of Systems  All other systems reviewed and are negative.  Blood pressure 123/73, pulse 86, temperature 98.8 F (37.1 C), temperature source Oral, resp. rate 18, height 5' 11"  (1.803 m), weight 83.1 kg (183 lb 3.2 oz), SpO2 98 %. Physical Exam  Constitutional: He is oriented to person, place, and time. He appears well-developed and well-nourished. No distress.  Cardiovascular: Normal rate, regular rhythm, normal heart sounds and intact distal pulses.  Exam reveals no gallop and no friction rub.   No murmur heard. Respiratory: Effort normal and breath sounds normal. No respiratory distress. He has no wheezes. He has no rales. He exhibits no tenderness.  GI: Soft. Bowel sounds are normal. He exhibits no distension and no mass. There is no rebound and no guarding.  Mild general ttp.   Musculoskeletal: Normal range of motion. He exhibits no edema or tenderness.  Neurological: He is oriented to person, place, and time.  Skin: Skin is warm and dry. No rash noted. He is not diaphoretic. No erythema. No pallor.  Psychiatric: He has a normal mood and affect. His behavior is normal. Judgment and thought content normal.    Assessment/Plan: Diagnostic laparoscopy excision of multiple nodules--Dr. Dalbert Batman 07/20/15 lymphoma SBO-does not need an NGT at this time.  Will repeat a AXR in AM to evaluate for progression of contrast.  If no progression, then will place an NGT and continue with conservative management.  He understands if he fails to improve he may need a laparotomy, but hopefully can avoid this.  Doubt leukocytosis is related to this.     Mariaclara Spear ANP-BC 10/23/2015, 9:22 AM

## 2015-10-23 NOTE — ED Notes (Signed)
Report given to Kim, RN.

## 2015-10-23 NOTE — ED Provider Notes (Signed)
CSN: [id number]     Arrival date & time 10/23/15  [id number] History  By signing my name below, I, Irene Pap, attest that this documentation has been prepared under the direction and in the presence of Shanon Rosser, MD. Electronically Signed: Irene Pap, ED Scribe. 10/23/2015. 4:06 AM.  Chief Complaint  Patient presents with  . Abdominal Pain   The history is provided by the patient. No language interpreter was used.   HPI Comments: Donald [name] is a 39 y.o. male with a hx of Burkitt's lymphoma who presents to the Emergency Department complaining of waxing and waning, severe cramping abdominal pain onset 4 hours ago. Pt reports one episode of emesis and nausea. He reports worsening pain with movement and after eating. Pt states that he was seen one week ago for similar symptoms. He reports that he has been having intermittent abdominal pain since his cancer diagnosis, but states that today is his worst episode. He denies diarrhea or constipation. He denies taking anything for his symptoms PTA.   Past Medical History  Diagnosis Date  . EBV infection 2013    Patient notes that he had an EBV infection in 2013 that left him with chronic fatigue. She also notes that he had significant lymphadenopathy and thyroiditis at the time. He has been managing his symptoms with an alternative medicine practitioner in [place] and with other alternative medicines.  . Marijuana use, episodic   . Complication of anesthesia   . Burkitt lymphoma ([place]) 08/15/2015   Past Surgical History  Procedure Laterality Date  . Tonsillectomy    . Appendectomy    . Laparoscopy N/A 07/20/2015    Procedure: LAPAROSCOPY DIAGNOSTIC, MULTIPLE BIOPSIES, DRAINAGE OF ASCITES;  Surgeon: Fanny Skates, MD;  Location: [facility];  Service: General;  Laterality: N/A;   Family History  Problem Relation Age of Onset  . Heart failure Father    Social History  Substance Use Topics  . Smoking status: Never Smoker   . Smokeless  tobacco: Never Used  . Alcohol Use: No    Review of Systems 10 Systems reviewed and all are negative for acute change except as noted in the HPI.  Allergies  Levaquin and Sulfa antibiotics  Home Medications   Prior to Admission medications   Medication Sig Start Date End Date Taking? Authorizing Provider  lansoprazole (PREVACID) 30 MG capsule Take 1 capsule (30 mg total) by mouth daily at 12 noon. Patient not taking: Reported on 10/14/2015 10/11/15   Brunetta Genera, MD  metoCLOPramide (REGLAN) 10 MG tablet Take 1 tablet (10 mg total) by mouth every 6 (six) hours as needed for nausea. 10/14/15   Harvel Quale, MD  Multiple Vitamin (MULTIVITAMIN WITH MINERALS) TABS tablet Take 1 tablet by mouth daily.    Historical Provider, MD  nystatin (MYCOSTATIN) 100000 UNIT/ML suspension Use as directed 5 mLs (500,000 Units total) in the mouth or throat 4 (four) times daily. For 10 days Patient not taking: Reported on 10/14/2015 10/11/15   Brunetta Genera, MD  ondansetron (ZOFRAN) 8 MG tablet Take 1 tablet (8 mg total) by mouth every 8 (eight) hours as needed for nausea or vomiting. Patient not taking: Reported on 09/21/2015 08/03/15   Brunetta Genera, MD  oxycodone (OXY-IR) 5 MG capsule Take 1 capsule (5 mg total) by mouth every 6 (six) hours as needed. Patient taking differently: Take 5 mg by mouth every 6 (six) hours as needed for pain.  09/21/15   Brunetta Genera, MD  oxyCODONE (  OXYCONTIN) 10 mg 12 hr tablet Take 1 tablet (10 mg total) by mouth every 12 (twelve) hours. 09/21/15   Brunetta Genera, MD  polyethylene glycol (MIRALAX / GLYCOLAX) packet Take 17 g by mouth daily. 08/19/15   Brunetta Genera, MD  PRESCRIPTION MEDICATION Patient receives his chemo treatments at the [facility] with Dr. Irene Limbo. He received Neulasta 6mg  on 08/21/15. He is on a 21 day cycle of EPOCH-R that he receives during hospitalization.    Historical Provider, MD   senna-docusate (SENOKOT-S) 8.6-50 MG tablet Take 2 tablets by mouth 2 (two) times daily. 09/21/15   Brunetta Genera, MD  sodium bicarbonate/sodium chloride SOLN 1 application by Mouth Rinse route 4 (four) times daily. Patient not taking: Reported on 10/14/2015 10/11/15   Brunetta Genera, MD   BP 128/83 mmHg  Pulse 105  Temp(Src) 98.7 F (37.1 C) (Oral)  Resp 22  SpO2 98%   Physical Exam General: Well-developed, well-nourished male in no acute distress; appearance consistent with age of record HENT: normocephalic; atraumatic Eyes: pupils equal, round and reactive to light; extraocular muscles intact Neck: supple Heart: regular rate and rhythm; tachycardic Lungs: clear to auscultation bilaterally Abdomen: soft; nondistended; diffusely tender; abdominal muscles become rigid when he has waves of pain; no masses or hepatosplenomegaly; bowel sounds hypoactive Extremities: No deformity; full range of motion; pulses normal Neurologic: Awake, alert and oriented; motor function intact in all extremities and symmetric; no facial droop Skin: Warm and dry Psychiatric: Normal mood and affect  ED Course  Procedures (including critical care time)   MDM   Nursing notes and vitals signs, including pulse oximetry, reviewed.  Summary of this visit's results, reviewed by myself:  Labs:  Results for orders placed or performed during the hospital encounter of 10/23/15 (from the past 24 hour(s))  Comprehensive metabolic panel     Status: Abnormal   Collection Time: 10/23/15  4:05 AM  Result Value Ref Range   Sodium 141 135 - 145 mmol/L   Potassium 3.5 3.5 - 5.1 mmol/L   Chloride 104 101 - 111 mmol/L   CO2 27 22 - 32 mmol/L   Glucose, Bld 122 (H) 65 - 99 mg/dL   BUN 12 6 - 20 mg/dL   Creatinine, Ser 0.78 0.61 - 1.24 mg/dL   Calcium 9.3 8.9 - 10.3 mg/dL   Total Protein 6.8 6.5 - 8.1 g/dL   Albumin 4.2 3.5 - 5.0 g/dL   AST 20 15 - 41 U/L   ALT 18 17 - 63 U/L   Alkaline Phosphatase 96  38 - 126 U/L   Total Bilirubin 0.5 0.3 - 1.2 mg/dL   GFR calc non Af Amer >60 >60 mL/min   GFR calc Af Amer >60 >60 mL/min   Anion gap 10 5 - 15  Lipase, blood     Status: None   Collection Time: 10/23/15  4:05 AM  Result Value Ref Range   Lipase 23 11 - 51 U/L  CBC with Differential/Platelet     Status: Abnormal   Collection Time: 10/23/15  4:05 AM  Result Value Ref Range   WBC 28.9 (H) 4.0 - 10.5 K/uL   RBC 3.79 (L) 4.22 - 5.81 MIL/uL   Hemoglobin 11.4 (L) 13.0 - 17.0 g/dL   HCT 35.1 (L) 39.0 - 52.0 %   MCV 92.6 78.0 - 100.0 fL   MCH 30.1 26.0 - 34.0 pg   MCHC 32.5 30.0 - 36.0 g/dL  RDW 15.4 11.5 - 15.5 %   Platelets 188 150 - 400 K/uL   Neutrophils Relative % 85 %   Lymphocytes Relative 6 %   Monocytes Relative 8 %   Eosinophils Relative 0 %   Basophils Relative 1 %   Neutro Abs 24.6 (H) 1.7 - 7.7 K/uL   Lymphs Abs 1.7 0.7 - 4.0 K/uL   Monocytes Absolute 2.3 (H) 0.1 - 1.0 K/uL   Eosinophils Absolute 0.0 0.0 - 0.7 K/uL   Basophils Absolute 0.3 (H) 0.0 - 0.1 K/uL   WBC Morphology      MODERATE LEFT SHIFT (>5% METAS AND MYELOS,OCC PRO NOTED)    Imaging Studies: Dg Abd Acute W/chest  10/23/2015  CLINICAL DATA:  Acute onset of vomiting and generalized abdominal cramping. Initial encounter. EXAM: DG ABDOMEN ACUTE W/ 1V CHEST COMPARISON:  Chest radiograph performed 07/22/2015, and abdominal radiograph performed 09/10/2015 FINDINGS: The lungs are well-aerated and clear. There is no evidence of focal opacification, pleural effusion or pneumothorax. The cardiomediastinal silhouette is within normal limits. A right-sided chest port is noted ending about the distal SVC. The visualized bowel gas pattern is unremarkable. Scattered stool and air are seen within the colon; there is no evidence of small bowel dilatation to suggest obstruction. No free intra-abdominal air is identified on the provided upright view. No acute osseous abnormalities are seen; the sacroiliac joints are  unremarkable in appearance. IMPRESSION: 1. Unremarkable bowel gas pattern; no free intra-abdominal air seen. Moderate amount of stool noted in the colon. 2. No acute cardiopulmonary process seen. Electronically Signed   By: Garald Balding M.D.   On: 10/23/2015 04:42   5:37 AM CT pending. Dr. Alcario Drought of [facility] to admit.   I personally performed the services described in this documentation, which was scribed in my presence. The recorded information has been reviewed and is accurate.   Shanon Rosser, MD 10/23/15 [phone]

## 2015-10-23 NOTE — ED Notes (Signed)
Pt is c/o severe abd pain and vomiting  Pt states it started tonight and has progressively gotten worse  Pt has cancer and his last treatment was on Dec 4th

## 2015-10-24 ENCOUNTER — Ambulatory Visit: Payer: BLUE CROSS/BLUE SHIELD | Admitting: Hematology

## 2015-10-24 ENCOUNTER — Other Ambulatory Visit: Payer: BLUE CROSS/BLUE SHIELD

## 2015-10-24 ENCOUNTER — Inpatient Hospital Stay (HOSPITAL_COMMUNITY): Payer: BLUE CROSS/BLUE SHIELD

## 2015-10-24 DIAGNOSIS — K5900 Constipation, unspecified: Secondary | ICD-10-CM

## 2015-10-24 DIAGNOSIS — K5669 Other intestinal obstruction: Secondary | ICD-10-CM

## 2015-10-24 DIAGNOSIS — R1084 Generalized abdominal pain: Secondary | ICD-10-CM

## 2015-10-24 DIAGNOSIS — C8370 Burkitt lymphoma, unspecified site: Secondary | ICD-10-CM

## 2015-10-24 MED ORDER — HEPARIN SOD (PORK) LOCK FLUSH 100 UNIT/ML IV SOLN
500.0000 [IU] | Freq: Once | INTRAVENOUS | Status: AC
  Administered 2015-10-24: 500 [IU] via INTRAVENOUS
  Filled 2015-10-24: qty 5

## 2015-10-24 NOTE — Progress Notes (Signed)
Donald [name]   HEMATOLOGY/ONCOLOGY INPATIENT PROGRESS NOTE  Date of Service: 10/24/2015  Inpatient Attending: . Dr Berle Mull  SUBJECTIVE  Donald [name] is one of my oncology patient's on active treatment for Donald [name] status post 4 cycles of EPOCH-R and 2 doses of intrathecal methotrexate for CNS prophylaxis. He has been having intermittent cramping abdominal pain and was admitted with some increased abdominal pain. CT of the abdomen showed possible partial small bowel obstruction in the right lower quadrant possibly related to scarring/dysmotility/stricture/lesions related to his initial presentation of Burkitts [name] with bowel involvement. He was seen yesterday morning and again this morning and notes that he has tolerated oral intake better. Abdominal x-ray this morning appears improved. No nausea or vomiting at this time. No acute abdominal pain at this time. He is being discharged today by the hospitalist service..   OBJECTIVE:  NAD  PHYSICAL EXAMINATION: . Filed Vitals:   10/23/15 0800 10/23/15 0900 10/23/15 2107 10/24/15 0534  BP: 125/69 123/73 114/64 110/51  Pulse: 75 86 65 89  Temp:  98.8 F (37.1 C) 98.5 F (36.9 C) 98.2 F (36.8 C)  TempSrc:   Oral Oral  Resp:  18 17 16   Height:  5\' 11"  (1.803 m)    Weight:  183 lb 3.2 oz (83.1 kg)    SpO2: 95% 98% 99% 99%   Filed Weights   10/23/15 0900  Weight: 183 lb 3.2 oz (83.1 kg)   .Body mass index is 25.56 kg/(m^2).  GENERAL:alert, in no acute distress and comfortable SKIN: skin color, texture, turgor are normal, no rashes or significant lesions EYES: normal, conjunctiva are pink and non-injected, sclera clear OROPHARYNX:no exudate, no erythema and lips, buccal mucosa, and tongue normal  NECK: supple, no JVD, thyroid normal size, non-tender, without nodularity LYMPH:  no palpable lymphadenopathy in the cervical, axillary or inguinal LUNGS: clear to auscultation with normal respiratory effort HEART: regular rate &  rhythm,  no murmurs and no lower extremity edema ABDOMEN: abdomen soft, overall nonspecific tenderness to palpation notes poorly localized, hypoactive bowel sounds. No guarding rigidity or rebound. Musculoskeletal: no cyanosis of digits and no clubbing  PSYCH: alert & oriented x 3 with fluent speech NEURO: no focal motor/sensory deficits  MEDICAL HISTORY:  Past Medical History  Diagnosis Date  . EBV infection 2013    Patient notes that he had an EBV infection in 2013 that left him with chronic fatigue. She also notes that he had significant lymphadenopathy and thyroiditis at the time. He has been managing his symptoms with an alternative medicine practitioner in [place] and with other alternative medicines.  . Marijuana use, episodic   . Complication of anesthesia   . Burkitt [name] ([place]) 08/15/2015    SURGICAL HISTORY: Past Surgical History  Procedure Laterality Date  . Tonsillectomy    . Appendectomy    . Laparoscopy N/A 07/20/2015    Procedure: LAPAROSCOPY DIAGNOSTIC, MULTIPLE BIOPSIES, DRAINAGE OF ASCITES;  Surgeon: Donald Skates, MD;  Location: [facility];  Service: General;  Laterality: N/A;    SOCIAL HISTORY: Social History   Social History  . Marital Status: Married    Spouse Name: N/A  . Number of Children: N/A  . Years of Education: N/A   Occupational History  . Not on file.   Social History Main Topics  . Smoking status: Never Smoker   . Smokeless tobacco: Never Used  . Alcohol Use: No  . Drug Use: 1.00 per week    Special: Marijuana  . Sexual Activity: Yes  Other Topics Concern  . 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   He has a fiance and has been in a steady relationship.      He has tried to maintain a very healthy lifestyle and cycles about 100 miles a week. He also uses a fair number of over-the-counter alternative medicines to stay healthy.     FAMILY HISTORY: Family History  Problem Relation Age of Onset  . Heart failure Father     ALLERGIES:  is allergic to levaquin and sulfa antibiotics.  MEDICATIONS:  Scheduled Meds: . heparin  5,000 Units Subcutaneous 3 times per day  . oxyCODONE  10 mg Oral Q12H  . pantoprazole  40 mg Oral Daily  . polyethylene glycol  17 g Oral Daily  . senna-docusate  2 tablet Oral BID   Continuous Infusions:  PRN Meds:.ondansetron, oxyCODONE  REVIEW OF SYSTEMS:    10 Point review of Systems was done is negative except as noted above.   LABORATORY DATA:  I have reviewed the data as listed  . CBC Latest Ref Rng 10/23/2015 10/14/2015 10/12/2015  WBC 4.0 - 10.5 K/uL 28.9(H) 5.2 4.6  Hemoglobin 13.0 - 17.0 g/dL 11.4(L) 11.8(L) 10.3(L)  Hematocrit 39.0 - 52.0 % 35.1(L) 34.9(L) 30.4(L)  Platelets 150 - 400 K/uL 188 222 211    . CMP Latest Ref Rng 10/23/2015 10/14/2015 10/12/2015  Glucose 65 - 99 mg/dL 122(H) 111(H) 94  BUN 6 - 20 mg/dL 12 18 15   Creatinine 0.61 - 1.24 mg/dL 0.78 0.64 0.59(L)  Sodium 135 - 145 mmol/L 141 137 140  Potassium 3.5 - 5.1 mmol/L 3.5 3.3(L) 3.4(L)  Chloride 101 - 111 mmol/L 104 101 106  CO2 22 - 32 mmol/L 27 29 26   Calcium 8.9 - 10.3 mg/dL 9.3 8.8(L) 8.6(L)  Total Protein 6.5 - 8.1 g/dL 6.8 6.1(L) 5.7(L)  Total Bilirubin 0.3 - 1.2 mg/dL 0.5 1.0 1.2  Alkaline Phos 38 - 126 U/L 96 42 39  AST 15 - 41 U/L 20 16 25   ALT 17 - 63 U/L 18 43 35     RADIOGRAPHIC STUDIES: I have personally reviewed the radiological images as listed and agreed with the findings in the report. US Abdomen Complete  10/11/2015  CLINICAL DATA:  Donald [name]. Right upper quadrant, left upper quadrant abdominal pain. Chemotherapy. EXAM: ULTRASOUND ABDOMEN COMPLETE COMPARISON:  CT of the abdomen and pelvis on 08/26/2015 FINDINGS: Gallbladder: Gallbladder has a normal appearance. Gallbladder wall is 1.5 mm, within normal limits. No stones or pericholecystic fluid. No sonographic  Murphy's sign. Common bile duct: Diameter: 3.2 mm Liver: No focal lesion identified. Within normal limits in parenchymal echogenicity. IVC: No abnormality visualized. Pancreas: Visualized portion unremarkable. Spleen: 8.9 cm and normal in appearance. Right Kidney: Length: 11.7 cm. Echogenicity within normal limits. No mass or hydronephrosis visualized. Left Kidney: Length: 12.2 cm. Echogenicity within normal limits. No mass or hydronephrosis visualized. Abdominal aorta: Limited evaluation of the abdominal aorta. Visualized portion is not aneurysmal. Other findings: None. IMPRESSION: 1. No evidence for acute cholecystitis. 2.  No evidence for acute  abnormality. Electronically Signed   By: Nolon Nations M.D.   On: 10/11/2015 11:46   Ct Abdomen Pelvis W Contrast  10/23/2015  CLINICAL DATA:  Acute onset of severe cramping generalized abdominal pain vomiting and nausea. Leukocytosis. Initial encounter. EXAM: CT ABDOMEN AND PELVIS WITH CONTRAST TECHNIQUE: Multidetector CT imaging of the  abdomen and pelvis was performed using the standard protocol following bolus administration of intravenous contrast. CONTRAST:  140mL OMNIPAQUE IOHEXOL 300 MG/ML  SOLN COMPARISON:  CT of the abdomen and pelvis performed 08/26/2015, and abdominal ultrasound performed 10/11/2015 FINDINGS: The visualized lung bases are clear. The liver and spleen are unremarkable in appearance. The gallbladder is within normal limits. The pancreas and adrenal glands are unremarkable. A tiny 2 mm nonobstructing stone is noted at the lower pole of the right kidney. The kidneys are otherwise unremarkable in appearance. There is no evidence of hydronephrosis. No obstructing ureteral stones are seen. No perinephric stranding is appreciated. The stomach is partially filled with contrast and grossly unremarkable. No acute vascular abnormalities are seen. There is dilatation of small bowel loops up to 3.8 cm in maximal diameter, with a focal transition point  at the right lower quadrant, possibly reflecting an adhesion. Mild associated mesenteric edema is noted. There is decompression of more distal small bowel. A small amount of free fluid is noted tracking along the left paracolic gutter and within the pelvis. The patient is status post appendectomy. The colon is unremarkable in appearance. The bladder is mildly distended and grossly unremarkable. The prostate remains normal in size. No inguinal lymphadenopathy is seen. No acute osseous abnormalities are identified. IMPRESSION: 1. Dilatation of small-bowel loops to 3.8 cm in maximal diameter, with a focal transition point at the right lower quadrant, raising concern for small bowel obstruction due to an adhesion. Mild associated mesenteric edema noted. Small amount of free fluid tracks along the left paracolic gutter and within the pelvis. 2. Tiny 2 mm nonobstructing stone at the lower pole of the right kidney. Electronically Signed   By: Garald Balding M.D.   On: 10/23/2015 06:21   Dg Abd 2 Views  10/24/2015  CLINICAL DATA:  Small bowel obstruction, abdominal pain EXAM: ABDOMEN - 2 VIEW COMPARISON:  10/23/2015 FINDINGS: Mild gaseous distend small bowel loops mid abdomen suspicious for ileus or improving small bowel obstruction. Moderate stool in right colon. IMPRESSION: Mild gaseous distended small bowel loops seen mid abdomen suspicious for ileus or improving small bowel obstruction. Moderate stool in right colon. Electronically Signed   By: Lahoma Crocker M.D.   On: 10/24/2015 09:04   Dg Abd Acute W/chest  10/23/2015  CLINICAL DATA:  Acute onset of vomiting and generalized abdominal cramping. Initial encounter. EXAM: DG ABDOMEN ACUTE W/ 1V CHEST COMPARISON:  Chest radiograph performed 07/22/2015, and abdominal radiograph performed 09/10/2015 FINDINGS: The lungs are well-aerated and clear. There is no evidence of focal opacification, pleural effusion or pneumothorax. The cardiomediastinal silhouette is within  normal limits. A right-sided chest port is noted ending about the distal SVC. The visualized bowel gas pattern is unremarkable. Scattered stool and air are seen within the colon; there is no evidence of small bowel dilatation to suggest obstruction. No free intra-abdominal air is identified on the provided upright view. No acute osseous abnormalities are seen; the sacroiliac joints are unremarkable in appearance. IMPRESSION: 1. Unremarkable bowel gas pattern; no free intra-abdominal air seen. Moderate amount of stool noted in the colon. 2. No acute cardiopulmonary process seen. Electronically Signed   By: Garald Balding M.D.   On: 10/23/2015 04:42   Dg Fluoro Guide Lumbar Puncture  10/08/2015  CLINICAL DATA:  [name]. EXAM: FLUOROSCOPICALLY GUIDED LUMBAR PUNCTURE FOR INTRATHECAL CHEMOTHERAPY TECHNIQUE: Informed consent was obtained from the patient prior to the procedure, including potential complications of headache, allergy, and pain. A 'time out' was performed. With  the patient prone, the lower back was prepped with Betadine. 1% Lidocaine was used for local anesthesia. Lumbar puncture was performed at the L2-3 using a 20 gauge needle with return of clear CSF. Opening pressure 20 cm H2O. 20 mg Of methotrexate was injected into the subarachnoid space. The patient tolerated the procedure well without apparent complication. FLUOROSCOPY TIME:  Fluoroscopy Time (in minutes and seconds): 0 minutes 26 seconds IMPRESSION: Intrathecal injection of chemotherapy without complication Electronically Signed   By: Franchot Gallo M.D.   On: 10/08/2015 13:07    ASSESSMENT & PLAN:   39 year old Caucasian male admitted with  1) abdominal pain related to partial small bowel obstruction. CT scan shows possible small obstruction related to adhesions/stricture/dysmotility related to his previous involvement of the small bowel with Donald [name] which has been in remission based on last PET/CT scan. He appears to be  improving with conservative management. Did not need bowel decompression with NG tube. Has been moving his bowels. Abdominal x-ray today shows improvement. Plan -Patient is likely being discharged today by the hospitalist service. -Will continue optimal bowel regimen. -Smaller more frequent meals. Was advised to try liquid/full liquid diet for the next few days and then gradually advanced it to soft foods. Low residue diet. -May eventually need possible surgical intervention for additional lysis/resection of involved bowel segment if continues to be symptomatic. -would attempt avoid surgical intervention until completion of therapy for his Donald [name] since this may delay potentially curative chemotherapy.  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 Patient is status post 4 cycles of EPOCH-R and received 2 dose of intrathecal methotrexate for CNS prophylaxis. His LDH level is down from 1200s to 300 to 188 to 155 to 146 PET/CT 08/22/2015 after 2 cycles of treatment. Complete metabolic response. No residual hypermetabolic [name]. No clinical or biochemical evidence of disease progression. Patient is status post EPOCH-R x 4 out of 6 planned cycles and 2 out of 4-6 doses of IT methotrexate for CNS prophylaxis.  Plan -as per patient's preference is fifth cycle of EPOCH-R has been delayed till after christmas and will be starting on 11/05/2015. Discussed this with inpatient Nurse manager and staffing is adequate. -patient will be admitted to the hospitalist service and one of my colleges will be consulting since I shall be out of town. -will order IT methotrexate for D1 or D2 as per IR availability.    I spent 30 minutes counseling the patient face to face. The total time spent in the appointment was 35 minutes and more than 50% was on counseling  and direct patient cares and co-ordination of cares with the hospitalist service.    Sullivan Lone MD [place] AAHIVMS [facility] Hematology/Oncology Physician [facility]  (Office):       [phone] (Work cell):  [phone] (Fax):           [phone]  10/24/2015 11:54 AM

## 2015-10-24 NOTE — Discharge Summary (Signed)
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 Follow up with hematology in 1 week   Diet recommendation: regular diet  Activity: The patient is advised to gradually reintroduce usual activities.  Discharge Condition: good  History of present illness: As per the H and P dictated on admission, "Donald [name] is a 39 y.o. male with h/o Burkitt's lymphoma, he is s/p cycle 4 of EPOCH-R discharged from hospital on 12/3, got neulasta on 12/5. His disease is believed to have responded to chemo with PET scan done on October 12th showing complete metabolic response.  The patient, who has been having severe abdominal pain episodically since his diagnosis in September, presents to the ED today with another episode of severe, cramping abdominal pain, worse with movement and onset after eating. This is located in his central abdomen. No diarrhea nor constipation. No fever."  Hospital Course:  Summary of his active problems in the hospital is as following. Principal Problem:   SBO (small bowel obstruction) ([place]) Patient presented with abdominal pain and nausea, CT scan was showing partial obstruction. He did have leucocytosis but it was felt due to recent GCSF injection. General surgery was consulted and patient was managed conservatively. Patient was initially requesting to leave the hospital as he was feeling much better after having a bowel movement in the ER.  Patient was observed as he did not have any bowel sounds on exam. But improved rapidly, his diet was advanced and he did not have any nausea and vomiting. His repeat  x ray was better. Patient was told that he needs to be compliant with constipation regimen.    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    Constipation Recommend to remain compliant with the diet.    Leukocytosis Wbc has increased due to his recent GCSF injection.   All other chronic medical condition were stable during the hospitalization.  Patient was ambulatory without any assistance. On the day of the discharge the patient was tolerating the diet, and no other acute medical condition were reported by patient. the patient was felt safe to be discharge at home with family support.  Procedures and Results:  none   Consultations:  General surgery  Hematology  Discharge Exam: Filed Weights   10/23/15 0900  Weight: 83.1 kg (183 lb 3.2 oz)   Filed Vitals:   10/23/15 2107 10/24/15 0534  BP: 114/64 110/51  Pulse: 65 89  Temp: 98.5 F (36.9 C) 98.2 F (36.8 C)  Resp: 17 16    General: Appear in mild distress, no Rash; Oral Mucosa moist. Cardiovascular: S1 and S2 Present, no Murmur, no JVD Respiratory: Bilateral Air entry present and Clear to Auscultation, no Crackles, no wheezes Abdomen: Bowel Sound present, Soft and mild tenderness Extremities: no Pedal edema, no calf tenderness Neurology: Grossly no focal neuro deficit.  DISCHARGE MEDICATION: Discharge Instructions    Diet - low sodium heart healthy    Complete by:  As directed      Increase activity slowly    Complete by:  As directed  Current Discharge Medication List    CONTINUE these medications which have NOT CHANGED   Details  ondansetron (ZOFRAN) 8 MG tablet Take 1 tablet (8 mg total) by mouth every 8 (eight) hours as needed for nausea or vomiting. Qty: 30 tablet, Refills: 3    oxycodone (OXY-IR) 5 MG capsule Take 1 capsule (5 mg total) by mouth every 6 (six) hours as needed. Qty: 30 capsule, Refills: 0    oxyCODONE  (OXYCONTIN) 10 mg 12 hr tablet Take 1 tablet (10 mg total) by mouth every 12 (twelve) hours. Qty: 60 tablet, Refills: 0    polyethylene glycol (MIRALAX / GLYCOLAX) packet Take 17 g by mouth daily. Qty: 30 each, Refills: 1    PRESCRIPTION MEDICATION Patient receives his chemo treatments at the [facility] with Dr. Irene Limbo. He received Neulasta 6mg  on 08/21/15. He is on a 21 day cycle of EPOCH-R that he receives during hospitalization.    lansoprazole (PREVACID) 30 MG capsule Take 1 capsule (30 mg total) by mouth daily at 12 noon. Qty: 30 capsule, Refills: 1    metoCLOPramide (REGLAN) 10 MG tablet Take 1 tablet (10 mg total) by mouth every 6 (six) hours as needed for nausea. Qty: 30 tablet, Refills: 0    nystatin (MYCOSTATIN) 100000 UNIT/ML suspension Use as directed 5 mLs (500,000 Units total) in the mouth or throat 4 (four) times daily. For 10 days Qty: 200 mL, Refills: 0   Associated Diagnoses: Oral thrush    senna-docusate (SENOKOT-S) 8.6-50 MG tablet Take 2 tablets by mouth 2 (two) times daily. 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 Levaquin [Levofloxacin] Other (See Comments)    Patient prefers not to take fluoroquinolones. He denies having any allergic reaction to them.  . Sulfa Antibiotics Other (See Comments)    Unknown, Patient was a child and does not remember the reaction   Follow-up Information    Follow up with Sullivan Lone, MD. Call in 3 days.   Specialties:  Hematology, Oncology   Contact information:   [place] [phone]       The results of significant diagnostics from this hospitalization (including imaging, microbiology, ancillary and laboratory) are listed below for reference.    Significant Diagnostic  Studies: US Abdomen Complete  10/11/2015  CLINICAL DATA:  Burkitt's lymphoma. Right upper quadrant, left upper quadrant abdominal pain. Chemotherapy. EXAM: ULTRASOUND ABDOMEN COMPLETE COMPARISON:  CT of the abdomen and pelvis on 08/26/2015 FINDINGS: Gallbladder: Gallbladder has a normal appearance. Gallbladder wall is 1.5 mm, within normal limits. No stones or pericholecystic fluid. No sonographic Murphy's sign. Common bile duct: Diameter: 3.2 mm Liver: No focal lesion identified. Within normal limits in parenchymal echogenicity. IVC: No abnormality visualized. Pancreas: Visualized portion unremarkable. Spleen: 8.9 cm and normal in appearance. Right Kidney: Length: 11.7 cm. Echogenicity within normal limits. No mass or hydronephrosis visualized. Left Kidney: Length: 12.2 cm. Echogenicity within normal limits. No mass or hydronephrosis visualized. Abdominal aorta: Limited evaluation of the abdominal aorta. Visualized portion is not aneurysmal. Other findings: None. IMPRESSION: 1. No evidence for acute cholecystitis. 2.  No evidence for acute  abnormality. Electronically Signed   By: Nolon Nations M.D.   On: 10/11/2015 11:46   Ct Abdomen  Pelvis W Contrast  10/23/2015  CLINICAL DATA:  Acute onset of severe cramping generalized abdominal pain vomiting and nausea. Leukocytosis. Initial encounter. EXAM: CT ABDOMEN AND PELVIS WITH CONTRAST TECHNIQUE: Multidetector CT imaging of the abdomen and pelvis was performed using the standard protocol following bolus administration of intravenous contrast. CONTRAST:  115mL OMNIPAQUE IOHEXOL 300 MG/ML  SOLN COMPARISON:  CT of the abdomen and pelvis performed 08/26/2015, and abdominal ultrasound performed 10/11/2015 FINDINGS: The visualized lung bases are clear. The liver and spleen are unremarkable in appearance. The gallbladder is within normal limits. The pancreas and adrenal glands are unremarkable. A tiny 2 mm nonobstructing stone is noted at the lower pole of the right  kidney. The kidneys are otherwise unremarkable in appearance. There is no evidence of hydronephrosis. No obstructing ureteral stones are seen. No perinephric stranding is appreciated. The stomach is partially filled with contrast and grossly unremarkable. No acute vascular abnormalities are seen. There is dilatation of small bowel loops up to 3.8 cm in maximal diameter, with a focal transition point at the right lower quadrant, possibly reflecting an adhesion. Mild associated mesenteric edema is noted. There is decompression of more distal small bowel. A small amount of free fluid is noted tracking along the left paracolic gutter and within the pelvis. The patient is status post appendectomy. The colon is unremarkable in appearance. The bladder is mildly distended and grossly unremarkable. The prostate remains normal in size. No inguinal lymphadenopathy is seen. No acute osseous abnormalities are identified. IMPRESSION: 1. Dilatation of small-bowel loops to 3.8 cm in maximal diameter, with a focal transition point at the right lower quadrant, raising concern for small bowel obstruction due to an adhesion. Mild associated mesenteric edema noted. Small amount of free fluid tracks along the left paracolic gutter and within the pelvis. 2. Tiny 2 mm nonobstructing stone at the lower pole of the right kidney. Electronically Signed   By: Garald Balding M.D.   On: 10/23/2015 06:21   Dg Abd 2 Views  10/24/2015  CLINICAL DATA:  Small bowel obstruction, abdominal pain EXAM: ABDOMEN - 2 VIEW COMPARISON:  10/23/2015 FINDINGS: Mild gaseous distend small bowel loops mid abdomen suspicious for ileus or improving small bowel obstruction. Moderate stool in right colon. IMPRESSION: Mild gaseous distended small bowel loops seen mid abdomen suspicious for ileus or improving small bowel obstruction. Moderate stool in right colon. Electronically Signed   By: Lahoma Crocker M.D.   On: 10/24/2015 09:04   Dg Abd Acute W/chest  10/23/2015   CLINICAL DATA:  Acute onset of vomiting and generalized abdominal cramping. Initial encounter. EXAM: DG ABDOMEN ACUTE W/ 1V CHEST COMPARISON:  Chest radiograph performed 07/22/2015, and abdominal radiograph performed 09/10/2015 FINDINGS: The lungs are well-aerated and clear. There is no evidence of focal opacification, pleural effusion or pneumothorax. The cardiomediastinal silhouette is within normal limits. A right-sided chest port is noted ending about the distal SVC. The visualized bowel gas pattern is unremarkable. Scattered stool and air are seen within the colon; there is no evidence of small bowel dilatation to suggest obstruction. No free intra-abdominal air is identified on the provided upright view. No acute osseous abnormalities are seen; the sacroiliac joints are unremarkable in appearance. IMPRESSION: 1. Unremarkable bowel gas pattern; no free intra-abdominal air seen. Moderate amount of stool noted in the colon. 2. No acute cardiopulmonary process seen. Electronically Signed   By: Garald Balding M.D.   On: 10/23/2015 04:42   Dg Fluoro Guide Lumbar Puncture  10/08/2015  CLINICAL DATA:  Lymphoma. EXAM: FLUOROSCOPICALLY GUIDED LUMBAR PUNCTURE FOR INTRATHECAL CHEMOTHERAPY TECHNIQUE: Informed consent was obtained from the patient prior to the procedure, including potential complications of headache, allergy, and pain. A 'time out' was performed. With the patient prone, the lower back was prepped with Betadine. 1% Lidocaine was used for local anesthesia. Lumbar puncture was performed at the L2-3 using a 20 gauge needle with return of clear CSF. Opening pressure 20 cm H2O. 20 mg Of methotrexate was injected into the subarachnoid space. The patient tolerated the procedure well without apparent complication. FLUOROSCOPY TIME:  Fluoroscopy Time (in minutes and seconds): 0 minutes 26 seconds IMPRESSION: Intrathecal injection of chemotherapy without complication Electronically Signed   By: Franchot Gallo  M.D.   On: 10/08/2015 13:07    Microbiology: No results found for this or any previous visit (from the past 240 hour(s)).   Labs: CBC:  Recent Labs Lab 10/23/15 0405  WBC 28.9*  NEUTROABS 24.6*  HGB 11.4*  HCT 35.1*  MCV 92.6  PLT 0000000   Basic Metabolic Panel:  Recent Labs Lab 10/23/15 0405  NA 141  K 3.5  CL 104  CO2 27  GLUCOSE 122*  BUN 12  CREATININE 0.78  CALCIUM 9.3   Liver Function Tests:  Recent Labs Lab 10/23/15 0405  AST 20  ALT 18  ALKPHOS 96  BILITOT 0.5  PROT 6.8  ALBUMIN 4.2    Recent Labs Lab 10/23/15 0405  LIPASE 23   No results for input(s): AMMONIA in the last 168 hours.  Cardiac Enzymes: No results for input(s): CKTOTAL, CKMB, CKMBINDEX, TROPONINI in the last 168 hours. BNP (last 3 results)  Recent Labs  07/20/15 1646  BNP 13.9    ProBNP (last 3 results) No results for input(s): PROBNP in the last 8760 hours.  CBG: No results for input(s): GLUCAP in the last 168 hours.  Time spent: 30 minutes  Signed:  PATEL, PRANAV  [facility] 10/24/2015, 9:24 AM

## 2015-10-24 NOTE — Progress Notes (Signed)
Pt is back from radiology. No complaints of N/V and states he was able to keep food down yesterday. Looking forward to going home

## 2015-10-24 NOTE — Progress Notes (Signed)
Patient ID: Donald [name], male   DOB: 10/15/1976, 39 y.o.   MRN: [id number]     [facility]      [address]   [place], [place] [address]    Phone: [phone] FAX: [phone]     Subjective: Passing flatus.  Tolerating POs.   Objective:  Vital signs:  Filed Vitals:   10/23/15 0800 10/23/15 0900 10/23/15 2107 10/24/15 0534  BP: 125/69 123/73 114/64 110/51  Pulse: 75 86 65 89  Temp:  98.8 F (37.1 C) 98.5 F (36.9 C) 98.2 F (36.8 C)  TempSrc:   Oral Oral  Resp:  18 17 16  Height:  5' 11" (1.803 m)    Weight:  83.1 kg (183 lb 3.2 oz)    SpO2: 95% 98% 99% 99%    Last BM Date: 10/23/15  Intake/Output   Yesterday:  12/13 0701 - 12/14 0700 In: 600 [P.O.:600] Out: -  This shift: I/O last 3 completed shifts: In: 600 [P.O.:600] Out: 500 [Emesis/NG output:500]    Physical Exam: General: Pt awake/alert/oriented x4 in no acute distress Abdomen: Soft.  Nondistended.  Mild lower abdominal ttp.   No evidence of peritonitis.  No incarcerated hernias.    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 ([facility])   Leukocytosis   SBO (small bowel obstruction) ([facility])    Results:   Labs: Results for orders placed or performed during the hospital encounter of 10/23/15 (from the past 48 hour(s))  Comprehensive metabolic panel     Status: Abnormal   Collection Time: 10/23/15  4:05 AM  Result Value Ref Range   Sodium 141 135 - 145 mmol/L   Potassium 3.5 3.5 - 5.1 mmol/L   Chloride 104 101 - 111 mmol/L   CO2 27 22 - 32 mmol/L   Glucose, Bld 122 (H) 65 - 99 mg/dL   BUN 12 6 - 20 mg/dL   Creatinine, Ser 0.78 0.61 - 1.24 mg/dL   Calcium 9.3 8.9 - 10.3 mg/dL   Total Protein 6.8 6.5 - 8.1 g/dL   Albumin 4.2 3.5 - 5.0 g/dL   AST 20 15 - 41 U/L   ALT 18 17 - 63 U/L   Alkaline Phosphatase 96 38 - 126 U/L   Total Bilirubin 0.5 0.3 - 1.2 mg/dL   GFR calc  non Af Amer >60 >60 mL/min   GFR calc Af Amer >60 >60 mL/min    Comment: (NOTE) The eGFR has been calculated using the CKD EPI equation. This calculation has not been validated in all clinical situations. eGFR's persistently <60 mL/min signify possible Chronic Kidney Disease.    Anion gap 10 5 - 15  Lipase, blood     Status: None   Collection Time: 10/23/15  4:05 AM  Result Value Ref Range   Lipase 23 11 - 51 U/L  CBC with Differential/Platelet     Status: Abnormal   Collection Time: 10/23/15  4:05 AM  Result Value Ref Range   WBC 28.9 (H) 4.0 - 10.5 K/uL    Comment: REPEATED TO VERIFY   RBC 3.79 (L) 4.22 - 5.81 MIL/uL   Hemoglobin 11.4 (L) 13.0 - 17.0 g/dL   HCT 35.1 (L) 39.0 - 52.0 %   MCV 92.6 78.0 - 100.0 fL   MCH 30.1 26.0 - 34.0 pg   MCHC 32.5 30.0 - 36.0 g/dL   RDW 15.4 11.5 -   15.5 %   Platelets 188 150 - 400 K/uL   Neutrophils Relative % 85 %   Lymphocytes Relative 6 %   Monocytes Relative 8 %   Eosinophils Relative 0 %   Basophils Relative 1 %   Neutro Abs 24.6 (H) 1.7 - 7.7 K/uL   Lymphs Abs 1.7 0.7 - 4.0 K/uL   Monocytes Absolute 2.3 (H) 0.1 - 1.0 K/uL   Eosinophils Absolute 0.0 0.0 - 0.7 K/uL   Basophils Absolute 0.3 (H) 0.0 - 0.1 K/uL   WBC Morphology      MODERATE LEFT SHIFT (>5% METAS AND MYELOS,OCC PRO NOTED)    Comment: DOHLE BODIES    Imaging / Studies: Ct Abdomen Pelvis W Contrast  10/23/2015  CLINICAL DATA:  Acute onset of severe cramping generalized abdominal pain vomiting and nausea. Leukocytosis. Initial encounter. EXAM: CT ABDOMEN AND PELVIS WITH CONTRAST TECHNIQUE: Multidetector CT imaging of the abdomen and pelvis was performed using the standard protocol following bolus administration of intravenous contrast. CONTRAST:  100mL OMNIPAQUE IOHEXOL 300 MG/ML  SOLN COMPARISON:  CT of the abdomen and pelvis performed 08/26/2015, and abdominal ultrasound performed 10/11/2015 FINDINGS: The visualized lung bases are clear. The liver and spleen are  unremarkable in appearance. The gallbladder is within normal limits. The pancreas and adrenal glands are unremarkable. A tiny 2 mm nonobstructing stone is noted at the lower pole of the right kidney. The kidneys are otherwise unremarkable in appearance. There is no evidence of hydronephrosis. No obstructing ureteral stones are seen. No perinephric stranding is appreciated. The stomach is partially filled with contrast and grossly unremarkable. No acute vascular abnormalities are seen. There is dilatation of small bowel loops up to 3.8 cm in maximal diameter, with a focal transition point at the right lower quadrant, possibly reflecting an adhesion. Mild associated mesenteric edema is noted. There is decompression of more distal small bowel. A small amount of free fluid is noted tracking along the left paracolic gutter and within the pelvis. The patient is status post appendectomy. The colon is unremarkable in appearance. The bladder is mildly distended and grossly unremarkable. The prostate remains normal in size. No inguinal lymphadenopathy is seen. No acute osseous abnormalities are identified. IMPRESSION: 1. Dilatation of small-bowel loops to 3.8 cm in maximal diameter, with a focal transition point at the right lower quadrant, raising concern for small bowel obstruction due to an adhesion. Mild associated mesenteric edema noted. Small amount of free fluid tracks along the left paracolic gutter and within the pelvis. 2. Tiny 2 mm nonobstructing stone at the lower pole of the right kidney. Electronically Signed   By: Jeffery  Chang M.D.   On: 10/23/2015 06:21   Dg Abd 2 Views  10/24/2015  CLINICAL DATA:  Small bowel obstruction, abdominal pain EXAM: ABDOMEN - 2 VIEW COMPARISON:  10/23/2015 FINDINGS: Mild gaseous distend small bowel loops mid abdomen suspicious for ileus or improving small bowel obstruction. Moderate stool in right colon. IMPRESSION: Mild gaseous distended small bowel loops seen mid abdomen  suspicious for ileus or improving small bowel obstruction. Moderate stool in right colon. Electronically Signed   By: Liviu  Pop M.D.   On: 10/24/2015 09:04   Dg Abd Acute W/chest  10/23/2015  CLINICAL DATA:  Acute onset of vomiting and generalized abdominal cramping. Initial encounter. EXAM: DG ABDOMEN ACUTE W/ 1V CHEST COMPARISON:  Chest radiograph performed 07/22/2015, and abdominal radiograph performed 09/10/2015 FINDINGS: The lungs are well-aerated and clear. There is no evidence of focal opacification, pleural effusion or   pneumothorax. The cardiomediastinal silhouette is within normal limits. A right-sided chest port is noted ending about the distal SVC. The visualized bowel gas pattern is unremarkable. Scattered stool and air are seen within the colon; there is no evidence of small bowel dilatation to suggest obstruction. No free intra-abdominal air is identified on the provided upright view. No acute osseous abnormalities are seen; the sacroiliac joints are unremarkable in appearance. IMPRESSION: 1. Unremarkable bowel gas pattern; no free intra-abdominal air seen. Moderate amount of stool noted in the colon. 2. No acute cardiopulmonary process seen. Electronically Signed   By: Garald Balding M.D.   On: 10/23/2015 04:42    Medications / Allergies:  Scheduled Meds: . heparin  5,000 Units Subcutaneous 3 times per day  . oxyCODONE  10 mg Oral Q12H  . pantoprazole  40 mg Oral Daily  . polyethylene glycol  17 g Oral Daily  . senna-docusate  2 tablet Oral BID   Continuous Infusions:  PRN Meds:.ondansetron, oxyCODONE  Antibiotics: Anti-infectives    None        Assessment/Plan SBO v ileus -tolerating POs, AXR improved.  Recommend miralax BID.  May be discharged.  Pt is anxious to leave, says he has a appt at 9:30AM.  I have reached out to Dr. Posey Pronto.   Erby Pian, ANP-BC [facility]   10/24/2015 9:17 AM

## 2015-10-24 NOTE — Progress Notes (Signed)
Discharge instructions reviewed with pt. No current concerns or complaints. Pt is stable at baseline. Will be leaving shortly

## 2015-10-31 ENCOUNTER — Other Ambulatory Visit: Payer: Self-pay | Admitting: *Deleted

## 2015-10-31 DIAGNOSIS — C8370 Burkitt lymphoma, unspecified site: Secondary | ICD-10-CM

## 2015-10-31 IMAGING — US US BIOPSY
1 series · 5 of 5 positions shown · non-contrast
Comparison: none

CLINICAL DATA: Extensive peritoneal nodularity and omental caking
of uncertain etiology. Periumbilical masses.

EXAM:
ULTRASOUND-GUIDED CORE PERIUMBILICAL BIOPSY
TECHNIQUE: An ultrasound guided biopsy was thoroughly discussed with the
patient and questions were answered. The benefits, risks,
alternatives, and complications were also discussed. The patient
understands and wishes to proceed with the procedure. A verbal as
well as written consent was obtained.

[Series 1: us biopsy · 0.07mm/px · 5 of 5 slices shown]
[im 1/5]
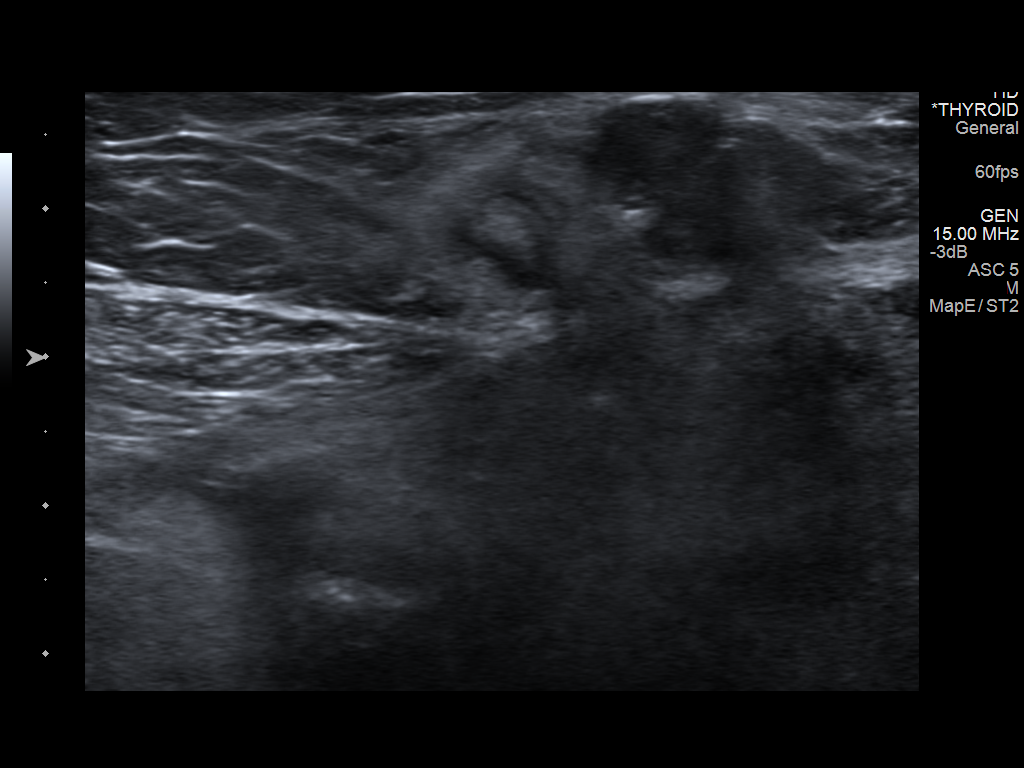
[im 2/5]
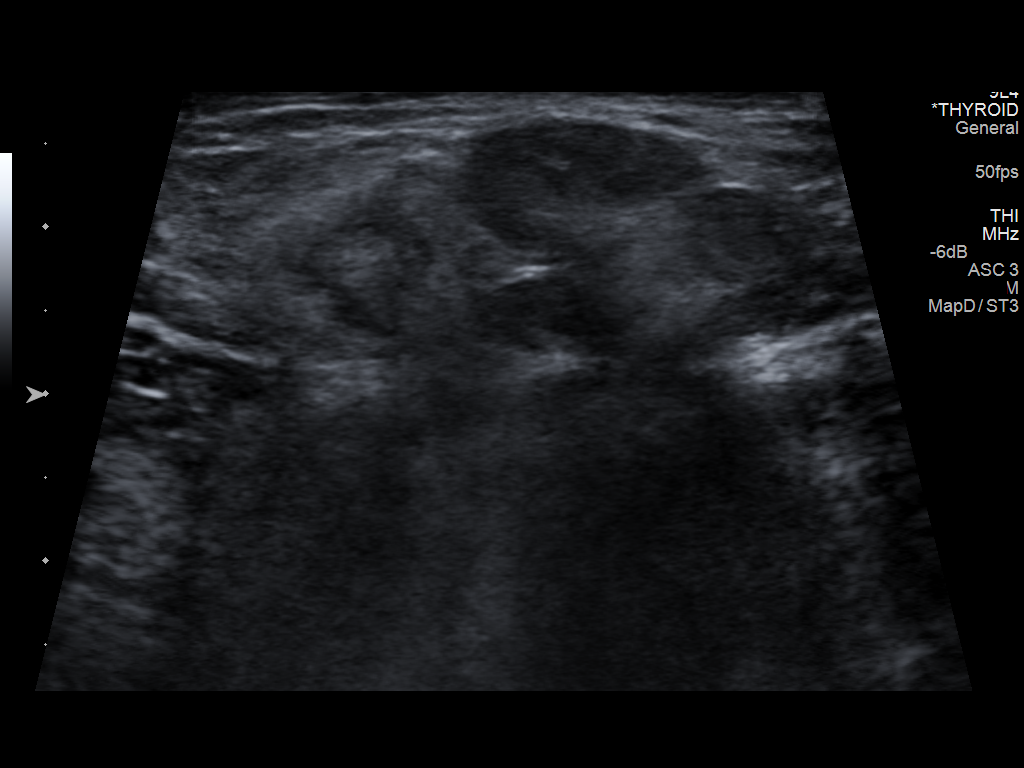
[im 3/5]
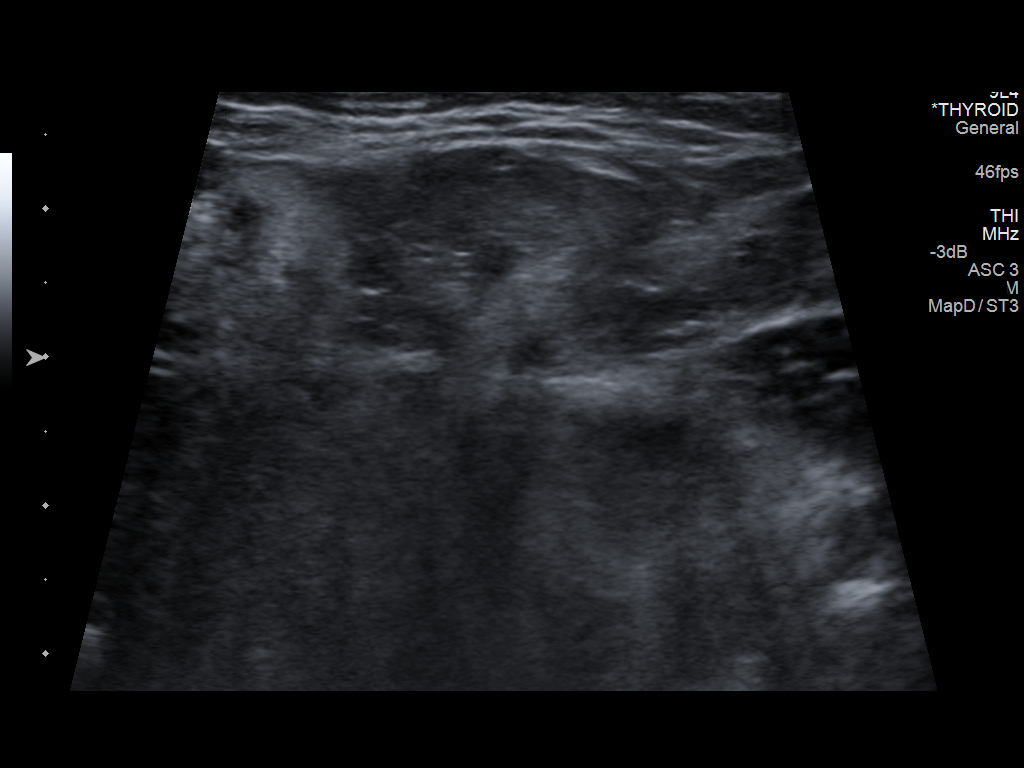
[im 4/5]
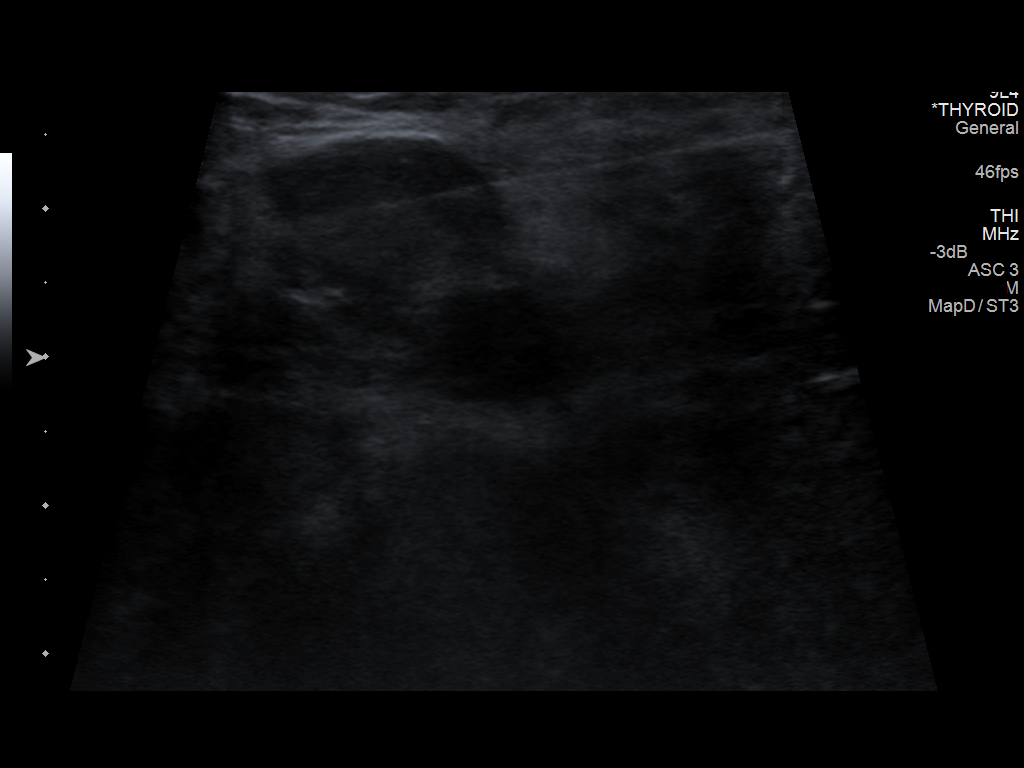
[im 5/5]
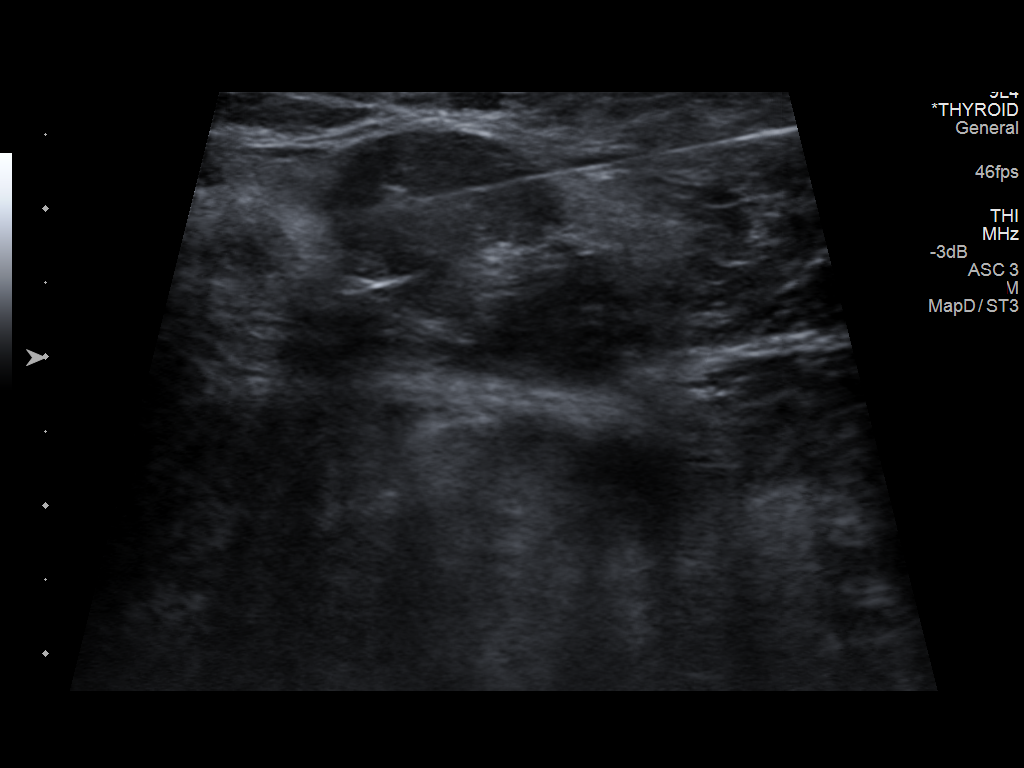

[5 of 5 positions shown; findings below may reference images not displayed]

Survey ultrasound of the periumbilical region was performed and an
appropriate skin entry site was determined. Skin site was marked,
prepped with chlorhexidine, and draped in usual sterile fashion, and
infiltrated locally with 1% lidocaine.

Intravenous Fentanyl and Versed were administered as conscious
sedation during continuous cardiorespiratory monitoring by the
radiology RN, with a total moderate sedation time of less than 30
minutes.

Under real-time ultrasound guidance, a 17 gauge trocar needle was
advanced to the margin of the lesion. Once needle tip position was
confirmed, coaxial 18-gauge core biopsy samples were obtained,
submitted in saline to surgical pathology. The guide needle was
removed. Postprocedure scans show no hemorrhage or other apparent
complication.

COMPLICATIONS:
COMPLICATIONS
None immediate
FINDINGS: Nodular periumbilical subcutaneous masses were localized. Core
biopsy samples were obtained without complication.
IMPRESSION: 1. Technically successful ultrasound guided core periumbilical
nodule biopsy.

## 2015-10-31 MED ORDER — OXYCODONE HCL ER 10 MG PO T12A: 10.0000 mg | EXTENDED_RELEASE_TABLET | Freq: Two times a day (BID) | ORAL | Status: DC

## 2015-10-31 NOTE — Telephone Encounter (Signed)
Received call from pt requesting refill on his oxocodone 10 mg & preferrably hydrocodone 7.5.  He states that he only takes half of hydrocodone & that works better that the 5 mg oxyIR.  He thinks the OxyIR is too much for him.  He req return call to [phone].  Message routed to Dr Irene Limbo & RN

## 2015-10-31 NOTE — Telephone Encounter (Signed)
Refilled oxycodone 10 mg. Will wait for MD [facility] approval about change of pain medication to hydrocodone 7.5 mg.

## 2015-10-31 NOTE — Telephone Encounter (Signed)
Contacted patient. Will refill oxycodone. RN will wait for MD [facility] approval for change of medication.

## 2015-11-01 ENCOUNTER — Other Ambulatory Visit: Payer: Self-pay | Admitting: Hematology

## 2015-11-05 ENCOUNTER — Inpatient Hospital Stay (HOSPITAL_COMMUNITY): Payer: BLUE CROSS/BLUE SHIELD

## 2015-11-05 ENCOUNTER — Inpatient Hospital Stay (HOSPITAL_COMMUNITY)
Admission: AD | Admit: 2015-11-05 | Discharge: 2015-11-09 | DRG: 847 | Disposition: A | Discharge status: Home / Self Care | Payer: BLUE CROSS/BLUE SHIELD | Source: Ambulatory Visit | Attending: Hematology & Oncology | Admitting: Hematology & Oncology

## 2015-11-05 DIAGNOSIS — C8373 Burkitt lymphoma, intra-abdominal lymph nodes: Secondary | ICD-10-CM | POA: Diagnosis not present

## 2015-11-05 DIAGNOSIS — Z5111 Encounter for antineoplastic chemotherapy: Secondary | ICD-10-CM | POA: Diagnosis present

## 2015-11-05 DIAGNOSIS — Z8249 Family history of ischemic heart disease and other diseases of the circulatory system: Secondary | ICD-10-CM | POA: Diagnosis not present

## 2015-11-05 DIAGNOSIS — Z79899 Other long term (current) drug therapy: Secondary | ICD-10-CM

## 2015-11-05 DIAGNOSIS — C8370 Burkitt lymphoma, unspecified site: Secondary | ICD-10-CM | POA: Diagnosis present

## 2015-11-05 DIAGNOSIS — R110 Nausea: Secondary | ICD-10-CM | POA: Diagnosis present

## 2015-11-05 DIAGNOSIS — C8378 Burkitt lymphoma, lymph nodes of multiple sites: Secondary | ICD-10-CM

## 2015-11-05 LAB — PROTIME-INR
INR: 1.05 (ref 0.00–1.49)
Prothrombin Time: 13.9 seconds (ref 11.6–15.2)

## 2015-11-05 LAB — COMPREHENSIVE METABOLIC PANEL
ALT: 18 U/L (ref 17–63)
AST: 23 U/L (ref 15–41)
Albumin: 3.5 g/dL (ref 3.5–5.0)
Alkaline Phosphatase: 53 U/L (ref 38–126)
Anion gap: 6 (ref 5–15)
BUN: 14 mg/dL (ref 6–20)
CHLORIDE: 104 mmol/L (ref 101–111)
CO2: 29 mmol/L (ref 22–32)
Calcium: 8.6 mg/dL — ABNORMAL LOW (ref 8.9–10.3)
Creatinine, Ser: 0.62 mg/dL (ref 0.61–1.24)
GFR calc Af Amer: >60 mL/min (ref >60)
Glucose, Bld: 95 mg/dL (ref 65–99)
POTASSIUM: 4 mmol/L (ref 3.5–5.1)
SODIUM: 139 mmol/L (ref 135–145)
Total Bilirubin: 0.4 mg/dL (ref 0.3–1.2)
Total Protein: 5.7 g/dL — ABNORMAL LOW (ref 6.5–8.1)

## 2015-11-05 LAB — CBC WITH DIFFERENTIAL/PLATELET
Basophils Absolute: 0 10*3/uL (ref 0.0–0.1)
Basophils Relative: 1 %
EOS PCT: 0 %
Eosinophils Absolute: 0 10*3/uL (ref 0.0–0.7)
HCT: 31.6 % — ABNORMAL LOW (ref 39.0–52.0)
HEMOGLOBIN: 10.5 g/dL — AB (ref 13.0–17.0)
LYMPHS ABS: 0.9 10*3/uL (ref 0.7–4.0)
LYMPHS PCT: 13 %
MCH: 30.8 pg (ref 26.0–34.0)
MCHC: 33.2 g/dL (ref 30.0–36.0)
MCV: 92.7 fL (ref 78.0–100.0)
Monocytes Absolute: 0.7 10*3/uL (ref 0.1–1.0)
Monocytes Relative: 9 %
NEUTROS PCT: 77 %
Neutro Abs: 5.3 10*3/uL (ref 1.7–7.7)
Platelets: 278 10*3/uL (ref 150–400)
RBC: 3.41 MIL/uL — AB (ref 4.22–5.81)
RDW: 15.1 % (ref 11.5–15.5)
WBC: 6.9 10*3/uL (ref 4.0–10.5)

## 2015-11-05 LAB — APTT: aPTT: 33 seconds (ref 24–37)

## 2015-11-05 MED ORDER — SODIUM CHLORIDE 0.9 % IJ SOLN: 10.0000 mL | INTRAMUSCULAR | Status: DC | PRN

## 2015-11-05 MED ORDER — BISACODYL 5 MG PO TBEC: 5.0000 mg | DELAYED_RELEASE_TABLET | Freq: Every day | ORAL | Status: DC | PRN

## 2015-11-05 MED ORDER — OXYCODONE HCL 5 MG PO TABS: 5.0000 mg | ORAL_TABLET | Freq: Four times a day (QID) | ORAL | Status: DC | PRN

## 2015-11-05 MED ORDER — ONDANSETRON HCL 8 MG PO TABS
8.0000 mg | ORAL_TABLET | Freq: Three times a day (TID) | ORAL | Status: DC | PRN
  Administered 2015-11-06 – 2015-11-07 (×3): 8 mg via ORAL
  Filled 2015-11-05 (×3): qty 1

## 2015-11-05 MED ORDER — HOT PACK MISC ONCOLOGY
1.0000 | Freq: Once | Status: DC | PRN
  Filled 2015-11-05: qty 1

## 2015-11-05 MED ORDER — COLD PACK MISC ONCOLOGY
1.0000 | Freq: Once | Status: DC | PRN
  Filled 2015-11-05: qty 1

## 2015-11-05 MED ORDER — ENOXAPARIN SODIUM 40 MG/0.4ML ~~LOC~~ SOLN: 40.0000 mg | SUBCUTANEOUS | Status: DC

## 2015-11-05 MED ORDER — OXYCODONE HCL ER 10 MG PO T12A
10.0000 mg | EXTENDED_RELEASE_TABLET | Freq: Two times a day (BID) | ORAL | Status: DC
  Administered 2015-11-05 – 2015-11-09 (×7): 10 mg via ORAL
  Filled 2015-11-05 (×7): qty 1

## 2015-11-05 MED ORDER — SODIUM CHLORIDE 0.9 % IJ SOLN: 3.0000 mL | INTRAMUSCULAR | Status: DC | PRN

## 2015-11-05 MED ORDER — OXYCODONE HCL ER 10 MG PO T12A: 10.0000 mg | EXTENDED_RELEASE_TABLET | Freq: Two times a day (BID) | ORAL | Status: DC

## 2015-11-05 MED ORDER — SODIUM CHLORIDE 0.9 % IV SOLN
Freq: Once | INTRAVENOUS | Status: AC
  Administered 2015-11-05: 16:00:00 via INTRAVENOUS
  Filled 2015-11-05: qty 4

## 2015-11-05 MED ORDER — SODIUM CHLORIDE 0.9 % IV SOLN
INTRAVENOUS | Status: DC
  Administered 2015-11-05 – 2015-11-07 (×3): via INTRAVENOUS

## 2015-11-05 MED ORDER — ENOXAPARIN SODIUM 40 MG/0.4ML ~~LOC~~ SOLN: 40.0000 mg | Freq: Every day | SUBCUTANEOUS | Status: DC

## 2015-11-05 MED ORDER — ALTEPLASE 2 MG IJ SOLR
2.0000 mg | Freq: Once | INTRAMUSCULAR | Status: DC | PRN
  Filled 2015-11-05: qty 2

## 2015-11-05 MED ORDER — PANTOPRAZOLE SODIUM 40 MG PO TBEC: 40.0000 mg | DELAYED_RELEASE_TABLET | Freq: Every day | ORAL | Status: DC

## 2015-11-05 MED ORDER — HEPARIN SOD (PORK) LOCK FLUSH 100 UNIT/ML IV SOLN: 250.0000 [IU] | Freq: Once | INTRAVENOUS | Status: DC | PRN

## 2015-11-05 MED ORDER — SENNOSIDES-DOCUSATE SODIUM 8.6-50 MG PO TABS: 2.0000 | ORAL_TABLET | Freq: Two times a day (BID) | ORAL | Status: DC

## 2015-11-05 MED ORDER — SODIUM CHLORIDE 0.9 % IV SOLN
INTRAVENOUS | Status: DC
Start: 2015-11-05 — End: 2015-11-09
  Administered 2015-11-05: 16:00:00 via INTRAVENOUS

## 2015-11-05 MED ORDER — METOCLOPRAMIDE HCL 10 MG PO TABS: 10.0000 mg | ORAL_TABLET | Freq: Four times a day (QID) | ORAL | Status: DC | PRN

## 2015-11-05 MED ORDER — HEPARIN SOD (PORK) LOCK FLUSH 100 UNIT/ML IV SOLN: 500.0000 [IU] | Freq: Once | INTRAVENOUS | Status: DC | PRN

## 2015-11-05 MED ORDER — POLYETHYLENE GLYCOL 3350 17 G PO PACK: 17.0000 g | PACK | Freq: Every day | ORAL | Status: DC

## 2015-11-05 MED ORDER — PREDNISONE 50 MG PO TABS
100.0000 mg | ORAL_TABLET | Freq: Every day | ORAL | Status: AC
  Administered 2015-11-05 – 2015-11-09 (×5): 100 mg via ORAL
  Filled 2015-11-05 (×5): qty 2

## 2015-11-05 MED ORDER — SENNOSIDES-DOCUSATE SODIUM 8.6-50 MG PO TABS
2.0000 | ORAL_TABLET | Freq: Two times a day (BID) | ORAL | Status: DC
  Administered 2015-11-05 – 2015-11-08 (×6): 2 via ORAL
  Administered 2015-11-09: 1 via ORAL
  Filled 2015-11-05 (×7): qty 2

## 2015-11-05 MED ORDER — PANTOPRAZOLE SODIUM 40 MG PO TBEC
40.0000 mg | DELAYED_RELEASE_TABLET | Freq: Every day | ORAL | Status: DC
  Administered 2015-11-05 – 2015-11-09 (×5): 40 mg via ORAL
  Filled 2015-11-05 (×5): qty 1

## 2015-11-05 MED ORDER — NYSTATIN 100000 UNIT/ML MT SUSP
5.0000 mL | Freq: Four times a day (QID) | OROMUCOSAL | Status: DC
  Administered 2015-11-05 – 2015-11-09 (×10): 500000 [IU] via OROMUCOSAL
  Filled 2015-11-05 (×8): qty 5

## 2015-11-05 MED ORDER — POLYETHYLENE GLYCOL 3350 17 G PO PACK
17.0000 g | PACK | Freq: Every day | ORAL | Status: DC
  Administered 2015-11-05 – 2015-11-06 (×2): 17 g via ORAL
  Filled 2015-11-05 (×4): qty 1

## 2015-11-05 MED ORDER — VINCRISTINE SULFATE CHEMO INJECTION 1 MG/ML
Freq: Once | INTRAVENOUS | Status: AC
  Administered 2015-11-05: 16:00:00 via INTRAVENOUS
  Filled 2015-11-05: qty 10

## 2015-11-05 NOTE — H&P (Signed)
Referral MD  Reason for Referral: Burkitt's lymphoma-in for cycle #5 of chemotherapy   No chief complaint on file. : I'm here for chemotherapy.  HPI: Donald [name] is a very nice 39 year old white male. He has Burkitt's lymphoma. He I think present with abdominal pain. He did not have she has involvement. He had a large right-sided pleural effusion and abdominal disease. Bone marrow was negative.  He's been treated with R-EPOCH. He also is getting intrathecal methotrexate. He has done very well. He has not had much in way toxicity. He has gone into remission.  He has been admitted for his sixth cycle of treatment.  Little bit of a upper respiratory syndrome. He is a little congested. He has some slight abdominal just discomfort. He has had bowel obstruction in the past.  He's had no problems with bleeding. He's had no leg swelling. He's had no rashes. He's had no visual issues. He's had no neurologic issues.  He's had no fever. He's had no weight loss or weight gain. Has not been any cough. He's had no mouth sores.  Overall, his performance status is ECOG 1.     Past Medical History  Diagnosis Date  . EBV infection 2013    Patient notes that he had an EBV infection in 2013 that left him with chronic fatigue. She also notes that he had significant lymphadenopathy and thyroiditis at the time. He has been managing his symptoms with an alternative medicine practitioner in [place] and with other alternative medicines.  . Marijuana use, episodic   . Complication of anesthesia   . Burkitt lymphoma ([place]) 08/15/2015  :  Past Surgical History  Procedure Laterality Date  . Tonsillectomy    . Appendectomy    . Laparoscopy N/A 07/20/2015    Procedure: LAPAROSCOPY DIAGNOSTIC, MULTIPLE BIOPSIES, DRAINAGE OF ASCITES;  Surgeon: Fanny Skates, MD;  Location: [facility];  Service: General;  Laterality: N/A;  :   Current facility-administered medications:  .  0.9 %  sodium chloride infusion, ,  Intravenous, Continuous, Volanda Napoleon, MD .  bisacodyl (DULCOLAX) EC tablet 5 mg, 5 mg, Oral, Daily PRN, Volanda Napoleon, MD .  enoxaparin (LOVENOX) injection 40 mg, 40 mg, Subcutaneous, Daily, Volanda Napoleon, MD .  metoCLOPramide (REGLAN) tablet 10 mg, 10 mg, Oral, Q6H PRN, Volanda Napoleon, MD .  nystatin (MYCOSTATIN) 100000 UNIT/ML suspension 500,000 Units, 5 mL, Mouth/Throat, QID, Volanda Napoleon, MD .  ondansetron [facility]) tablet 8 mg, 8 mg, Oral, Q8H PRN, Volanda Napoleon, MD .  oxycodone (OXY-IR) immediate release capsule 5 mg, 5 mg, Oral, Q6H PRN, Volanda Napoleon, MD .  oxyCODONE (OXYCONTIN) 12 hr tablet 10 mg, 10 mg, Oral, Q12H, Volanda Napoleon, MD .  pantoprazole (PROTONIX) EC tablet 40 mg, 40 mg, Oral, Daily, Volanda Napoleon, MD .  polyethylene glycol (MIRALAX / GLYCOLAX) packet 17 g, 17 g, Oral, Daily, Volanda Napoleon, MD .  senna-docusate (Senokot-S) tablet 2 tablet, 2 tablet, Oral, BID, Volanda Napoleon, MD:  . enoxaparin (LOVENOX) injection  40 mg Subcutaneous Daily  . nystatin  5 mL Mouth/Throat QID  . oxyCODONE  10 mg Oral Q12H  . pantoprazole  40 mg Oral Daily  . polyethylene glycol  17 g Oral Daily  . senna-docusate  2 tablet Oral BID  :  Allergies  Allergen Reactions  . Levaquin [Levofloxacin] Other (See Comments)    Patient prefers not to take fluoroquinolones. He denies having any allergic reaction to them.  [name]  Sulfa Antibiotics Other (See Comments)    Unknown, Patient was a child and does not remember the reaction  :  Family History  Problem Relation Age of Onset  . Heart failure Father   :  Social History   Social History  . Marital Status: Married    Spouse Name: N/A  . Number of Children: N/A  . Years of Education: N/A   Occupational History  . Not on file.   Social History Main Topics  . Smoking status: Never Smoker   . Smokeless tobacco: Never Used  . Alcohol Use: No  . Drug Use: 1.00 per week    Special: Marijuana  . Sexual Activity: Yes    Other Topics Concern  . 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   He has a fiance and has been in a steady relationship.      He has tried to maintain a very healthy lifestyle and cycles about 100 miles a week. He also uses a fair number of over-the-counter alternative medicines to stay healthy.  :  Pertinent items are noted in HPI.  Exam: Patient Vitals for the past 24 hrs:  BP Temp Temp src Pulse Resp SpO2 Height Weight  11/05/15 0945 (!) 114/58 mmHg 98.7 F (37.1 C) Oral (!) 54 18 99 % 5\' 11"  (1.803 m) 178 lb 1.6 oz (80.786 kg)    well-developed and well-nourished white male in no obvious distress. Head and neck exam shows no ocular or oral lesions. He has no palpable cervical or supraclavicular lymph nodes. Lungs are clear. Cardiac exam regular rate and rhythm with no murmurs, rubs or bruits. Abdomen is soft. He has good bowel sounds. There is no fluid wave. There is no palpable abdominal mass. His no palpable liver or spleen tip. Back exam shows no tenderness over the spine, ribs or hips. External shows no clubbing, cyanosis or edema. His good range of motion of his joints. He has good strength. Skin exam shows no rashes, ecchymoses or petechia.   No results for input(s): WBC, HGB, HCT, PLT in the last 72 hours. No results for input(s): NA, K, CL, CO2, GLUCOSE, BUN, CREATININE, CALCIUM in the last 72 hours.  Blood smear review:  None  Pathology: None     Assessment and Plan:  Donald [name] is a 39 year old white male. He has Burkitt's lymphoma. He is in remission.  He will be admitted for [place] 6 cycles of treatment. He has a little bit of a viral syndrome right now. However, I think that he can go ahead with treatment.  We will check his chest x-ray. We'll do an EKG.  We will see what his lab work looks like.  Radiology will do the intrathecal methotrexate on him. Donald [name] is  not too keen on having this done. However, he realizes how important it really is.  We will keep him on his home medications.  Hopefully, he will be home over the weekend.  Ramond Dial 2:52

## 2015-11-05 NOTE — Progress Notes (Signed)
Chemo dosages and calculations done manually with Lottie Dawson RN

## 2015-11-06 ENCOUNTER — Other Ambulatory Visit: Payer: Self-pay | Admitting: Hematology and Oncology

## 2015-11-06 ENCOUNTER — Inpatient Hospital Stay (HOSPITAL_COMMUNITY): Payer: BLUE CROSS/BLUE SHIELD

## 2015-11-06 DIAGNOSIS — R110 Nausea: Secondary | ICD-10-CM

## 2015-11-06 LAB — COMPREHENSIVE METABOLIC PANEL
ALT: 20 U/L (ref 17–63)
ANION GAP: 9 (ref 5–15)
AST: 20 U/L (ref 15–41)
Albumin: 3.7 g/dL (ref 3.5–5.0)
Alkaline Phosphatase: 50 U/L (ref 38–126)
BUN: 15 mg/dL (ref 6–20)
CHLORIDE: 105 mmol/L (ref 101–111)
CO2: 25 mmol/L (ref 22–32)
CREATININE: 0.68 mg/dL (ref 0.61–1.24)
Calcium: 8.9 mg/dL (ref 8.9–10.3)
Glucose, Bld: 145 mg/dL — ABNORMAL HIGH (ref 65–99)
POTASSIUM: 4.1 mmol/L (ref 3.5–5.1)
Sodium: 139 mmol/L (ref 135–145)
Total Bilirubin: 0.4 mg/dL (ref 0.3–1.2)
Total Protein: 6 g/dL — ABNORMAL LOW (ref 6.5–8.1)

## 2015-11-06 LAB — CBC
HCT: 32.3 % — ABNORMAL LOW (ref 39.0–52.0)
Hemoglobin: 10.6 g/dL — ABNORMAL LOW (ref 13.0–17.0)
MCH: 29.9 pg (ref 26.0–34.0)
MCHC: 32.8 g/dL (ref 30.0–36.0)
MCV: 91 fL (ref 78.0–100.0)
PLATELETS: 269 10*3/uL (ref 150–400)
RBC: 3.55 MIL/uL — AB (ref 4.22–5.81)
RDW: 14.7 % (ref 11.5–15.5)
WBC: 8.3 10*3/uL (ref 4.0–10.5)

## 2015-11-06 MED ORDER — VINCRISTINE SULFATE CHEMO INJECTION 1 MG/ML
Freq: Once | INTRAVENOUS | Status: AC
  Administered 2015-11-06: 14:00:00 via INTRAVENOUS
  Filled 2015-11-06: qty 10

## 2015-11-06 MED ORDER — SODIUM BICARBONATE/SODIUM CHLORIDE MOUTHWASH
OROMUCOSAL | Status: DC
  Administered 2015-11-06 (×2): via OROMUCOSAL
  Administered 2015-11-06: 1 via OROMUCOSAL
  Administered 2015-11-08 – 2015-11-09 (×5): via OROMUCOSAL
  Filled 2015-11-06: qty 1000

## 2015-11-06 MED ORDER — SODIUM CHLORIDE 0.9 % IJ SOLN
Freq: Once | INTRAMUSCULAR | Status: AC
  Administered 2015-11-06: 10:00:00 via INTRATHECAL
  Filled 2015-11-06: qty 0.48

## 2015-11-06 MED ORDER — LORAZEPAM 1 MG PO TABS
1.0000 mg | ORAL_TABLET | ORAL | Status: DC | PRN
  Administered 2015-11-06 – 2015-11-08 (×5): 1 mg via SUBLINGUAL
  Filled 2015-11-06 (×5): qty 1

## 2015-11-06 MED ORDER — BIOTENE DRY MOUTH MT LIQD
15.0000 mL | OROMUCOSAL | Status: DC
  Administered 2015-11-06 – 2015-11-08 (×3): 15 mL via OROMUCOSAL

## 2015-11-06 MED ORDER — SODIUM CHLORIDE 0.9 % IV SOLN
Freq: Once | INTRAVENOUS | Status: AC
  Administered 2015-11-06: 14:00:00 via INTRAVENOUS
  Filled 2015-11-06: qty 4

## 2015-11-06 NOTE — Progress Notes (Signed)
Chemo calculations and dosages checked with Lottie Dawson RN

## 2015-11-06 NOTE — Care Management Note (Signed)
Case Management Note  Patient Details  Name: Donald [name] MRN: [id number] Date of Birth: 1976/06/30  Subjective/Objective:      39 yo admitted with Burkitt Lymphoma for chemotherapy              Action/Plan: From home with wife  Expected Discharge Date:   (Unknown)               Expected Discharge Plan:  Home/Self Care  In-House Referral:     Discharge planning Services  CM Consult  Post Acute Care Choice:    Choice offered to:     DME Arranged:    DME Agency:     HH Arranged:    HH Agency:     Status of Service:  In process, will continue to follow  Medicare Important Message Given:    Date Medicare IM Given:    Medicare IM give by:    Date Additional Medicare IM Given:    Additional Medicare Important Message give by:     If discussed at [place] of Stay Meetings, dates discussed:    Additional Comments:  Lynnell Catalan, RN 11/06/2015, 2:27 PM

## 2015-11-06 NOTE — Procedures (Signed)
Lumbar puncture performed at L3-L4.  No CSF removed.  12 mg methotrexate injected intrathecally in total volume of 10 mL.  Needle removed.  No complications.  See full dictation in PACS.

## 2015-11-06 NOTE — Progress Notes (Signed)
Donald [name] is doing well. He is having some nausea. I see what we can try to help with this.  The chemotherapy is going okay.  His chest x-ray on admission looked okay. He had no infiltrate. His EKG showed normal sinus rhythm.  He's having no abdominal pain. He's having no problems with bowels or bladder.  He is not on any kind mouth rinse right now. I'll go and get him on some sodium bicarbonate mouth rinse and also some Biotene mouth rinse.  I will have to see one radiology will do the intrathecal methotrexate.  He's had no fever. He's had no sweats. He's had no rashes.  On his physical exam, all of his vital signs are stable. Blood pressure 103/42. Temperature 98.6. Oral exam shows no mucositis. There is no adenopathy in the neck. Pupils react appropriately. Lungs are clear. Cardiac exam regular rate and rhythm with no murmurs, rubs or bruits. Abdomen is soft. Has good bowel sounds. There is no fluid wave. There is no palpable liver or spleen tip. Back exam shows no tenderness over the spine, ribs or hips. Extremities shows no clubbing, cyanosis or edema. Has good strength in his extremities. Skin exam shows no rashes, ecchymoses or petechia. Neurological exam shows no focal neurological deficits.  Donald [name] has Burkitt's lymphoma. He is in for cycle 6 of treatment. He is in remission.  We will continue his chemotherapy is planned.  I will have to see one radiology will do the intrathecal methotrexate.  I appreciate the outstanding care that he is getting from all the staff upon [address].!!!! Pete E

## 2015-11-07 MED ORDER — SODIUM CHLORIDE 0.9 % IV SOLN
Freq: Once | INTRAVENOUS | Status: AC
  Administered 2015-11-07: 12:00:00 via INTRAVENOUS
  Filled 2015-11-07: qty 4

## 2015-11-07 MED ORDER — PROCHLORPERAZINE EDISYLATE 5 MG/ML IJ SOLN
10.0000 mg | Freq: Four times a day (QID) | INTRAMUSCULAR | Status: DC | PRN
  Administered 2015-11-07: 10 mg via INTRAVENOUS
  Filled 2015-11-07: qty 2

## 2015-11-07 MED ORDER — VINCRISTINE SULFATE CHEMO INJECTION 1 MG/ML
Freq: Once | INTRAVENOUS | Status: AC
  Administered 2015-11-07: 13:00:00 via INTRAVENOUS
  Filled 2015-11-07: qty 10

## 2015-11-07 NOTE — Progress Notes (Signed)
Donald [name] is doing a little better this morning. He does not have as much nausea. Ativan seemed to help him.  He did get the intrathecal methotrexate yesterday. He did very well with this. He's had no headache.  The chemotherapy is going well. He's had no mouth sores. He is rinsing his mouth out now.  He's had no cough. He's had no shortness of breath. He's had no rashes. He's had no leg swelling.  He is has had little bit of constipation.  There's not been any bleeding.  On his physical exam, his vital signs are all stable. Temperature 97.9. Pulse 94. Blood pressure 106/51. Head and neck exam shows no ocular or oral lesion. There are no palpable cervical or supraclavicular lymph nodes. Lungs are clear. Cardiac exam regular rate and rhythm with no murmurs, rubs or bruits. Abdomen soft. Has good bowel sounds. There is no fluid wave. There is no palpable liver or spleen tip. Extremity shows no clubbing, cyanosis or edema. Skin exam shows no rashes, ecchymoses or petechia. Neurological exam shows no focal neurological deficits.  He'll continue his chemotherapy for the Burkitt's lymphoma. He is done well with this. He should finish up on Friday, December 30.  I will check some labs on him tomorrow.  Staff on [address] are doing a great job with him. He   Pete E.  Isaiah 41:10

## 2015-11-08 ENCOUNTER — Encounter (HOSPITAL_COMMUNITY): Payer: Self-pay

## 2015-11-08 LAB — CBC WITH DIFFERENTIAL/PLATELET
BASOS ABS: 0 10*3/uL (ref 0.0–0.1)
BASOS PCT: 0 %
EOS ABS: 0 10*3/uL (ref 0.0–0.7)
Eosinophils Relative: 0 %
HCT: 30.6 % — ABNORMAL LOW (ref 39.0–52.0)
HEMOGLOBIN: 10.1 g/dL — AB (ref 13.0–17.0)
LYMPHS ABS: 0.7 10*3/uL (ref 0.7–4.0)
Lymphocytes Relative: 9 %
MCH: 29.8 pg (ref 26.0–34.0)
MCHC: 33 g/dL (ref 30.0–36.0)
MCV: 90.3 fL (ref 78.0–100.0)
Monocytes Absolute: 0.7 10*3/uL (ref 0.1–1.0)
Monocytes Relative: 9 %
NEUTROS PCT: 82 %
Neutro Abs: 5.9 10*3/uL (ref 1.7–7.7)
Platelets: 220 10*3/uL (ref 150–400)
RBC: 3.39 MIL/uL — AB (ref 4.22–5.81)
RDW: 14.5 % (ref 11.5–15.5)
WBC: 7.2 10*3/uL (ref 4.0–10.5)

## 2015-11-08 LAB — COMPREHENSIVE METABOLIC PANEL
ALBUMIN: 3.3 g/dL — AB (ref 3.5–5.0)
ALT: 27 U/L (ref 17–63)
ANION GAP: 6 (ref 5–15)
AST: 22 U/L (ref 15–41)
Alkaline Phosphatase: 39 U/L (ref 38–126)
BILIRUBIN TOTAL: 0.8 mg/dL (ref 0.3–1.2)
BUN: 16 mg/dL (ref 6–20)
CHLORIDE: 108 mmol/L (ref 101–111)
CO2: 28 mmol/L (ref 22–32)
Calcium: 8.7 mg/dL — ABNORMAL LOW (ref 8.9–10.3)
Creatinine, Ser: 0.66 mg/dL (ref 0.61–1.24)
GFR calc Af Amer: >60 mL/min (ref >60)
GFR calc non Af Amer: >60 mL/min (ref >60)
GLUCOSE: 114 mg/dL — AB (ref 65–99)
POTASSIUM: 3.9 mmol/L (ref 3.5–5.1)
SODIUM: 142 mmol/L (ref 135–145)
TOTAL PROTEIN: 5.4 g/dL — AB (ref 6.5–8.1)

## 2015-11-08 MED ORDER — SODIUM CHLORIDE 0.9 % IV SOLN
Freq: Once | INTRAVENOUS | Status: AC
  Administered 2015-11-08 (×2): via INTRAVENOUS
  Filled 2015-11-08: qty 8

## 2015-11-08 MED ORDER — SODIUM CHLORIDE 0.9 % IV SOLN
150.0000 mg | Freq: Once | INTRAVENOUS | Status: AC
  Administered 2015-11-08: 150 mg via INTRAVENOUS
  Filled 2015-11-08: qty 5

## 2015-11-08 MED ORDER — VINCRISTINE SULFATE CHEMO INJECTION 1 MG/ML
Freq: Once | INTRAVENOUS | Status: AC
  Administered 2015-11-08: 13:00:00 via INTRAVENOUS
  Filled 2015-11-08: qty 10

## 2015-11-08 NOTE — Progress Notes (Signed)
Donald [name] is having a little more in the way of nausea. This could be from the chemotherapy. It could also be from the intrathecal methotrexate that he had. I will go and give him a dose of Zofran/Decadron/Emend.  He has had no emesis.  He's not complaining of any headache. There's no mouth sores. He's had no cough.  He's had no bleeding.  He's had no diarrhea.  His had no leg swelling. He's had no weakness. He is out of bed without difficulties.   He had lab work done today. His white cell count 7.2. His hemoglobin is 10.1. His platelet count is 220,000. His BUN is 16 and creatinine 0.66. Potassium 3.9. His albumin is 3.3.  On his physical exam, his vital signs are all stable. His temperature is 98.1. Pulse is 42. Blood pressure 118/67. Head and neck exam shows no ocular or oral lesions. He has no palpable cervical or supraclavicular lymph nodes. Lungs are clear. Cardiac exam regular rate and rhythm. He has no murmurs. Abdomen is soft. Bowel sounds my be slightly decreased. He has no guarding or rebound tenderness. He has no fluid wave. There is no palpable hepatosplenomegaly. Extremities shows no clubbing, cyanosis or edema. Has good range motion of his joints. He has good strength. Neurological exam shows no focal neurological deficits.  Donald [name] is to complete his chemotherapy tomorrow. So far, everything is going okay. Over, the extra anti-emetic regimen will help.  We will go ahead and continue him on the IV fluids.  I still anticipate that he will be able to go home tomorrow.  I very much appreciate everybody's help and the outstanding care that he is getting from the staff on [place] 139:23

## 2015-11-08 NOTE — Progress Notes (Signed)
Manual calculation of BSA and dosing for doxorubicin, etoposide, and vincristine completed.  Secondary verification by Elsie Macabuag, RN 

## 2015-11-09 ENCOUNTER — Other Ambulatory Visit: Payer: Self-pay | Admitting: Hematology & Oncology

## 2015-11-09 ENCOUNTER — Telehealth: Payer: Self-pay | Admitting: *Deleted

## 2015-11-09 MED ORDER — DIPHENHYDRAMINE HCL 50 MG PO CAPS
50.0000 mg | ORAL_CAPSULE | Freq: Once | ORAL | Status: AC
  Administered 2015-11-09: 50 mg via ORAL
  Filled 2015-11-09: qty 1

## 2015-11-09 MED ORDER — EPINEPHRINE HCL 0.1 MG/ML IJ SOSY
0.2500 mg | PREFILLED_SYRINGE | Freq: Once | INTRAMUSCULAR | Status: DC | PRN
  Filled 2015-11-09: qty 10

## 2015-11-09 MED ORDER — ALBUTEROL SULFATE (2.5 MG/3ML) 0.083% IN NEBU: 2.5000 mg | INHALATION_SOLUTION | Freq: Once | RESPIRATORY_TRACT | Status: DC | PRN

## 2015-11-09 MED ORDER — DIPHENHYDRAMINE HCL 50 MG/ML IJ SOLN: 25.0000 mg | Freq: Once | INTRAMUSCULAR | Status: DC | PRN

## 2015-11-09 MED ORDER — EPINEPHRINE HCL 1 MG/ML IJ SOLN
0.5000 mg | Freq: Once | INTRAMUSCULAR | Status: DC | PRN
  Filled 2015-11-09: qty 1

## 2015-11-09 MED ORDER — METHYLPREDNISOLONE SODIUM SUCC 125 MG IJ SOLR: 125.0000 mg | Freq: Once | INTRAMUSCULAR | Status: DC | PRN

## 2015-11-09 MED ORDER — SODIUM CHLORIDE 0.9 % IV SOLN
Freq: Once | INTRAVENOUS | Status: AC
  Administered 2015-11-09: 11:00:00 via INTRAVENOUS
  Filled 2015-11-09: qty 8

## 2015-11-09 MED ORDER — DIPHENHYDRAMINE HCL 50 MG/ML IJ SOLN: 50.0000 mg | Freq: Once | INTRAMUSCULAR | Status: DC | PRN

## 2015-11-09 MED ORDER — SODIUM CHLORIDE 0.9 % IJ SOLN: 3.0000 mL | INTRAMUSCULAR | Status: DC | PRN

## 2015-11-09 MED ORDER — FAMOTIDINE IN NACL 20-0.9 MG/50ML-% IV SOLN: 20.0000 mg | Freq: Once | INTRAVENOUS | Status: DC | PRN

## 2015-11-09 MED ORDER — SODIUM CHLORIDE 0.9 % IV SOLN
150.0000 mg | Freq: Once | INTRAVENOUS | Status: AC
  Administered 2015-11-09: 150 mg via INTRAVENOUS
  Filled 2015-11-09: qty 5

## 2015-11-09 MED ORDER — ACETAMINOPHEN 325 MG PO TABS
650.0000 mg | ORAL_TABLET | Freq: Once | ORAL | Status: AC
  Administered 2015-11-09: 650 mg via ORAL
  Filled 2015-11-09: qty 2

## 2015-11-09 MED ORDER — ALTEPLASE 2 MG IJ SOLR
2.0000 mg | Freq: Once | INTRAMUSCULAR | Status: DC | PRN
  Filled 2015-11-09: qty 2

## 2015-11-09 MED ORDER — SODIUM CHLORIDE 0.9 % IV SOLN
375.0000 mg/m2 | Freq: Once | INTRAVENOUS | Status: AC
  Administered 2015-11-09: 800 mg via INTRAVENOUS
  Filled 2015-11-09: qty 80

## 2015-11-09 MED ORDER — SODIUM CHLORIDE 0.9 % IV SOLN: Freq: Once | INTRAVENOUS | Status: DC | PRN

## 2015-11-09 MED ORDER — HEPARIN SOD (PORK) LOCK FLUSH 100 UNIT/ML IV SOLN: 250.0000 [IU] | Freq: Once | INTRAVENOUS | Status: DC | PRN

## 2015-11-09 MED ORDER — HEPARIN SOD (PORK) LOCK FLUSH 100 UNIT/ML IV SOLN
500.0000 [IU] | Freq: Once | INTRAVENOUS | Status: DC | PRN
  Filled 2015-11-09: qty 5

## 2015-11-09 MED ORDER — HYDROCODONE-ACETAMINOPHEN 7.5-325 MG PO TABS: 1.0000 | ORAL_TABLET | Freq: Four times a day (QID) | ORAL | Status: DC | PRN

## 2015-11-09 MED ORDER — SODIUM CHLORIDE 0.9 % IJ SOLN: 10.0000 mL | INTRAMUSCULAR | Status: DC | PRN

## 2015-11-09 MED ORDER — HYDROCODONE-ACETAMINOPHEN 7.5-325 MG PO TABS
1.0000 | ORAL_TABLET | Freq: Four times a day (QID) | ORAL | Status: DC | PRN
Start: 2015-11-09 — End: 2015-11-09

## 2015-11-09 MED ORDER — SODIUM CHLORIDE 0.9 % IV SOLN: Freq: Once | INTRAVENOUS | Status: DC

## 2015-11-09 MED ORDER — SODIUM CHLORIDE 0.9 % IV SOLN
750.0000 mg/m2 | Freq: Once | INTRAVENOUS | Status: AC
  Administered 2015-11-09: 1600 mg via INTRAVENOUS
  Filled 2015-11-09: qty 80

## 2015-11-09 NOTE — Discharge Instructions (Signed)
Call if any bleeding, vomiting that will not respond to medications, temperature over 101* bad diarrhea or constipation.

## 2015-11-09 NOTE — Progress Notes (Signed)
Manual calculation of BSA and dosing for Rituximab and Cytoxan completed.  Secondary verification by Aldean Baker RN

## 2015-11-09 NOTE — Discharge Summary (Signed)
#   [id number] is discharge summary.  Donald [name]

## 2015-11-09 NOTE — Progress Notes (Signed)
Discharge instructions reviewed with patient, Rx for Norco given.  Patients Neulasta injection scheduled for Tuesday 11/13/15 at 8:45 a.m., written on instruction sheet for patient.  Questions answered, verbalized understanding of instructions.

## 2015-11-09 NOTE — Discharge Summary (Signed)
[name], [name]                ACCOUNT NO.:  [id number]  MEDICAL RECORD NO.:  [id number]  LOCATION:  [id number]                         FACILITY:  [facility]  PHYSICIAN:  Volanda Napoleon, M.D.  DATE OF BIRTH:  Oct 27, 1976  DATE OF ADMISSION:  11/05/2015 DATE OF DISCHARGE:  11/09/2015                              DISCHARGE SUMMARY   DIAGNOSIS ON DISCHARGE:  Status post cycle #5 of R-EPOCH for Burkitt's lymphoma.  CONDITION ON DISCHARGE:  Stable.  ACTIVITIES:  As tolerated.  DIET:  Without restrictions.  FOLLOWUP: 1. The patient will go to the [facility] on December     31st for a Neulasta injection. 2. The patient will see Dr. Lenon Curt in 1 week.  MEDICATIONS UPON DISCHARGE: 1. Vicodin (7.5/325) 1-2 p.o. q.6 hours p.r.n. pain. 2. OxyContin 10 mg p.o. b.i.d. 3. Prevacid 30 mg p.o. daily. 4. Reglan 10 mg p.o. q.6 hours p.r.n. nausea and vomiting. 5. Zofran 8 mg p.o. q.8 hours p.r.n. 6. MiraLAX 17 g p.o. daily. 7. Senokot-S2 p.o. b.i.d.  HOSPITAL COURSE:  Donald [name] was admitted for cycle #5 of chemotherapy for his Burkitt's lymphoma.  He has been in remission.  He has tolerated treatment pretty well.  On admission, he was complaining of some upper respiratory symptoms.  I did get a chest x-ray.  The chest x-ray was normal.  We did do an EKG on him.  EKG showed normal sinus rhythm.  His labs looked okay on admission.  He has Port-A-Cath accessed.  He was started on IV fluids.  He tolerated his chemotherapy quite well.  On the 27th, he received intrathecal methotrexate, he had 12 mg. Radiology did this for [place].  I am very appreciative of radiology's help.  He was eating well.  He had a low bit of the nausea during the hospital stay.  I gave him some Zofran, Decadron, and Emend.  This helped quite a bit.  I checked his labs on the 29th.  His sodium was 142, potassium 3.9, BUN 16, and creatinine 0.66.  His glucose was 114.  His white cell count 7.2, hemoglobin 10.1,  and platelet count 220,000.  He was out of bed without difficulties.  He was going to the bathroom without any difficulties.  He completed his chemotherapy on the 30th.  He had tolerated this well. He got an extra dose of Zofran/Decadron/Emend.  He was ready for discharge after treatment.  PHYSICAL EXAMINATION:  On the morning of discharge, his physical exam showed; VITAL SIGNS:  Stable vital signs.  His temperature was 97.7, pulse 44, and blood pressure 112/62. HEAD AND NECK:  Shows no ocular or oral lesions.  There are no palpable cervical or supraclavicular lymph nodes. LUNGS:  Clear to percussion and auscultation bilaterally. CARDIAC:  Regular rate and rhythm with no murmurs, rubs or bruits. ABDOMEN:  Soft.  He has good bowel sounds.  There is no fluid wave. There is no palpable liver or spleen tip. EXTREMITIES:  Show no clubbing, cyanosis or edema. NEUROLOGICAL EXAM:  Showed no focal neurological deficits.     Volanda Napoleon, M.D.     PRE/MEDQ  D:  11/09/2015  T:  11/09/2015  Job:  [id number]

## 2015-11-09 NOTE — Telephone Encounter (Signed)
Received call from nurse in hospital.  Needs to schedule injection appointment for Neulasta injection.  Patient aware of date and time for appt.

## 2015-11-10 ENCOUNTER — Encounter: Payer: Self-pay | Admitting: Nurse Practitioner

## 2015-11-10 NOTE — Progress Notes (Signed)
Patient's mother in [facility] infusion room today, 12/31 Saturday to pick up oxycontin rx. She states the patient spoke to Dr. Grier Mitts RN prior to admission for inpatient chemo and this rx would be waiting for him after d/c. No rx in book and no telephone documentation of that conversation. This RN called Dr. Marin Olp, on call MD, who informs that he will write new oxy rx and leave it on [facility] tomorrow after rounding; pt/family to pick up then. I informed pt's mother of this, she understands we cannot call this medication in to pharmacy. Norco rx (90 tabs) given on 12/30 at time of hospital d/c, and it appears oxycontin 10 mg Rx given on 12/21 per medication list, although pt's mother said they never got the paper Rx. Md informed of this. Mother to fill norco in the meantime and pick up new oxycontin Rx tomorrow, 1/1 on [facility].

## 2015-11-11 ENCOUNTER — Other Ambulatory Visit: Payer: Self-pay | Admitting: Hematology & Oncology

## 2015-11-11 DIAGNOSIS — C8370 Burkitt lymphoma, unspecified site: Secondary | ICD-10-CM

## 2015-11-11 MED ORDER — OXYCODONE HCL ER 10 MG PO T12A: 10.0000 mg | EXTENDED_RELEASE_TABLET | Freq: Two times a day (BID) | ORAL | Status: DC

## 2015-11-13 ENCOUNTER — Other Ambulatory Visit: Payer: Self-pay | Admitting: Hematology

## 2015-11-13 ENCOUNTER — Ambulatory Visit (HOSPITAL_BASED_OUTPATIENT_CLINIC_OR_DEPARTMENT_OTHER): Payer: BLUE CROSS/BLUE SHIELD

## 2015-11-13 VITALS — BP 118/70 | HR 60 | Temp 98.4°F

## 2015-11-13 DIAGNOSIS — Z5189 Encounter for other specified aftercare: Secondary | ICD-10-CM | POA: Diagnosis not present

## 2015-11-13 DIAGNOSIS — Z5111 Encounter for antineoplastic chemotherapy: Secondary | ICD-10-CM

## 2015-11-13 DIAGNOSIS — C8378 Burkitt lymphoma, lymph nodes of multiple sites: Secondary | ICD-10-CM | POA: Diagnosis not present

## 2015-11-13 MED ORDER — PEGFILGRASTIM INJECTION 6 MG/0.6ML
6.0000 mg | Freq: Once | SUBCUTANEOUS | Status: AC
  Administered 2015-11-13: 6 mg via SUBCUTANEOUS
  Filled 2015-11-13: qty 0.6

## 2015-11-14 ENCOUNTER — Telehealth: Payer: Self-pay | Admitting: Hematology

## 2015-11-14 NOTE — Telephone Encounter (Signed)
per pof to sch pt appt-cld & spoke to pt and gave pt time & date of appt for 1/10 °

## 2015-11-20 ENCOUNTER — Encounter: Payer: Self-pay | Admitting: Hematology

## 2015-11-20 ENCOUNTER — Ambulatory Visit (HOSPITAL_BASED_OUTPATIENT_CLINIC_OR_DEPARTMENT_OTHER): Payer: BLUE CROSS/BLUE SHIELD | Admitting: Hematology

## 2015-11-20 ENCOUNTER — Ambulatory Visit: Payer: BLUE CROSS/BLUE SHIELD

## 2015-11-20 ENCOUNTER — Other Ambulatory Visit (HOSPITAL_BASED_OUTPATIENT_CLINIC_OR_DEPARTMENT_OTHER): Payer: BLUE CROSS/BLUE SHIELD

## 2015-11-20 VITALS — BP 116/76 | HR 100 | Temp 98.5°F | Resp 16 | Wt 177.2 lb

## 2015-11-20 DIAGNOSIS — R7989 Other specified abnormal findings of blood chemistry: Secondary | ICD-10-CM

## 2015-11-20 DIAGNOSIS — E883 Tumor lysis syndrome: Secondary | ICD-10-CM

## 2015-11-20 DIAGNOSIS — J019 Acute sinusitis, unspecified: Secondary | ICD-10-CM | POA: Diagnosis not present

## 2015-11-20 DIAGNOSIS — C8370 Burkitt lymphoma, unspecified site: Secondary | ICD-10-CM

## 2015-11-20 DIAGNOSIS — C8379 Burkitt lymphoma, extranodal and solid organ sites: Secondary | ICD-10-CM

## 2015-11-20 DIAGNOSIS — C8378 Burkitt lymphoma, lymph nodes of multiple sites: Secondary | ICD-10-CM

## 2015-11-20 DIAGNOSIS — R945 Abnormal results of liver function studies: Secondary | ICD-10-CM

## 2015-11-20 DIAGNOSIS — J0140 Acute pansinusitis, unspecified: Secondary | ICD-10-CM

## 2015-11-20 LAB — CBC & DIFF AND RETIC
BASO%: 0.7 % (ref 0.0–2.0)
Basophils Absolute: 0.1 10*3/uL (ref 0.0–0.1)
EOS%: 0.2 % (ref 0.0–7.0)
Eosinophils Absolute: 0 10*3/uL (ref 0.0–0.5)
HCT: 34.9 % — ABNORMAL LOW (ref 38.4–49.9)
HGB: 11.1 g/dL — ABNORMAL LOW (ref 13.0–17.1)
Immature Retic Fract: 16.4 % — ABNORMAL HIGH (ref 3.00–10.60)
LYMPH%: 8.5 % — ABNORMAL LOW (ref 14.0–49.0)
MCH: 28.9 pg (ref 27.2–33.4)
MCHC: 31.8 g/dL — AB (ref 32.0–36.0)
MCV: 91.1 fL (ref 79.3–98.0)
MONO#: 1.5 10*3/uL — ABNORMAL HIGH (ref 0.1–0.9)
MONO%: 7.5 % (ref 0.0–14.0)
NEUT%: 83.1 % — ABNORMAL HIGH (ref 39.0–75.0)
NEUTROS ABS: 16.7 10*3/uL — AB (ref 1.5–6.5)
Platelets: 224 10*3/uL (ref 140–400)
RBC: 3.83 10*6/uL — ABNORMAL LOW (ref 4.20–5.82)
RDW: 15 % — AB (ref 11.0–14.6)
RETIC %: 3.27 % — AB (ref 0.80–1.80)
Retic Ct Abs: 125.24 10*3/uL — ABNORMAL HIGH (ref 34.80–93.90)
WBC: 20 10*3/uL — AB (ref 4.0–10.3)
lymph#: 1.7 10*3/uL (ref 0.9–3.3)

## 2015-11-20 LAB — COMPREHENSIVE METABOLIC PANEL
ALK PHOS: 101 U/L (ref 40–150)
ALT: 30 U/L (ref 0–55)
AST: 22 U/L (ref 5–34)
Albumin: 3.8 g/dL (ref 3.5–5.0)
Anion Gap: 11 mEq/L (ref 3–11)
BUN: 11.8 mg/dL (ref 7.0–26.0)
CO2: 28 meq/L (ref 22–29)
Calcium: 9.4 mg/dL (ref 8.4–10.4)
Chloride: 103 mEq/L (ref 98–109)
Creatinine: 0.9 mg/dL (ref 0.7–1.3)
GLUCOSE: 78 mg/dL (ref 70–140)
POTASSIUM: 3.8 meq/L (ref 3.5–5.1)
SODIUM: 142 meq/L (ref 136–145)
Total Bilirubin: <0.3 mg/dL (ref 0.20–1.20)
Total Protein: 6.8 g/dL (ref 6.4–8.3)

## 2015-11-20 LAB — URIC ACID: URIC ACID, SERUM: 7.1 mg/dL (ref 2.6–7.4)

## 2015-11-20 MED ORDER — SODIUM CHLORIDE 0.9 % IJ SOLN
10.0000 mL | INTRAMUSCULAR | Status: DC | PRN
  Filled 2015-11-20: qty 10

## 2015-11-20 MED ORDER — AZITHROMYCIN 250 MG PO TABS: ORAL_TABLET | ORAL | Status: DC

## 2015-11-20 MED ORDER — HEPARIN SOD (PORK) LOCK FLUSH 100 UNIT/ML IV SOLN
500.0000 [IU] | Freq: Once | INTRAVENOUS | Status: DC
  Filled 2015-11-20: qty 5

## 2015-11-26 NOTE — Progress Notes (Signed)
Donald [name]    HEMATOLOGY/ONCOLOGY CLINIC NOTE  Date of Service: 11/20/2015  Patient Care Team: [name], DO as PCP - General (Internal Medicine)  CHIEF COMPLAINTS/PURPOSE OF CONSULTATION:  Posthospitalization follow-up for Burkitt's lymphoma  Diagnosis: Stage III Burkitt's lymphoma without CNS involvement  TREATMENT EPOCH-R + CNS prophylaxis as per NEJM 2013 Dunleavy et al. S/p 5 cycles of EPOCH-R  s/p 3 doses of intrathecal methotrexate plus hydrocortisone for CNS prophylaxis  INTERVAL HISTORY  Mr. Damiean Mitsch is is here for her scheduled clinic follow-up after his fifth cycle of inpatient EPOCH-R chemotherapy and IT methotrexate for CNS prophylaxis. He had some issues with nausea during his admission which were controlled with aggressive meds.  Knows that he tolerated the last cycle of chemotherapy well and had no acute concerns. He had a good Christmas and new year. Notes generalized bone pains therapy significant with the Neulasta shot. Notes that several people in this family had an upper respiratory tract infection over the new year and he notes significant functional maxillary sinus pressure and greenish nasal discharge. He notes that he has been using colloidal [employer]. I counseled him that we should consider treating him with antibiotics given that he will be due for chemotherapy again and we do not want happen take a chance with an uncontrolled bacterial infection. He was started on Z-Pak and called [place] back couple of days later noting that he felt much better. He doesn't feel up to start his sixth cycle of EPOCH-R on 11/26/2015 when its due but wants some time to overcome his sinus infection and started treatment on 12/03/2015 which is reasonable. He notes no other acute new symptoms. Has been moving his bowels okay with no obvious issues with obstruction.   MEDICAL HISTORY:  Past Medical History  Diagnosis Date  . EBV infection 2013    Patient notes that he had an  EBV infection in 2013 that left him with chronic fatigue. She also notes that he had significant lymphadenopathy and thyroiditis at the time. He has been managing his symptoms with an alternative medicine practitioner in [place] and with other alternative medicines.  . Marijuana use, episodic   . Complication of anesthesia   . Burkitt lymphoma ([place]) 08/15/2015    SURGICAL HISTORY: Past Surgical History  Procedure Laterality Date  . Tonsillectomy    . Appendectomy    . Laparoscopy N/A 07/20/2015    Procedure: LAPAROSCOPY DIAGNOSTIC, MULTIPLE BIOPSIES, DRAINAGE OF ASCITES;  Surgeon: Fanny Skates, MD;  Location: [facility];  Service: General;  Laterality: N/A;    SOCIAL HISTORY: Social History   Social History  . Marital Status: Married    Spouse Name: N/A  . Number of Children: N/A  . Years of Education: N/A   Occupational History  . Not on file.   Social History Main Topics  . Smoking status: Never Smoker   . Smokeless tobacco: Never Used  . Alcohol Use: No  . Drug Use: 1.00 per week    Special: Marijuana  . Sexual Activity: Yes   Other Topics Concern  . 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   He has a fiance and has been in a steady relationship.      He has tried to maintain a very healthy lifestyle and cycles about 100 miles a week. He also uses a fair number of over-the-counter alternative medicines to stay healthy.  FAMILY HISTORY: Family History  Problem Relation Age of Onset  . Heart failure Father     ALLERGIES:  is allergic to levaquin and sulfa antibiotics.  MEDICATIONS:  Current Outpatient Prescriptions  Medication Sig Dispense Refill  . HYDROcodone-acetaminophen (NORCO) 7.5-325 MG tablet Take 1-2 tablets by mouth every 6 (six) hours as needed for severe pain. 90 tablet 0  . lansoprazole (PREVACID) 30 MG capsule Take 1 capsule (30 mg total) by mouth  daily at 12 noon. 30 capsule 1  . metoCLOPramide (REGLAN) 10 MG tablet Take 1 tablet (10 mg total) by mouth every 6 (six) hours as needed for nausea. 30 tablet 0  . NON FORMULARY Supplements    . nystatin (MYCOSTATIN) 100000 UNIT/ML suspension Use as directed 5 mLs (500,000 Units total) in the mouth or throat 4 (four) times daily. For 10 days (Patient taking differently: Use as directed 5 mLs in the mouth or throat 4 (four) times daily as needed (mouth coating). ) 200 mL 0  . ondansetron (ZOFRAN) 8 MG tablet Take 1 tablet (8 mg total) by mouth every 8 (eight) hours as needed for nausea or vomiting. 30 tablet 3  . oxyCODONE (OXYCONTIN) 10 mg 12 hr tablet Take 1 tablet (10 mg total) by mouth every 12 (twelve) hours. 60 tablet 0  . polyethylene glycol (MIRALAX / GLYCOLAX) packet Take 17 g by mouth daily. (Patient taking differently: Take 17 g by mouth daily as needed for mild constipation. ) 30 each 1  . PRESCRIPTION MEDICATION Patient receives his chemo treatments at the [facility] at [facility] with Dr. Irene Limbo. He received Neulasta 6mg  on 08/21/15. He is on a 21 day cycle of EPOCH-R that he receives during hospitalization.    . senna-docusate (SENOKOT-S) 8.6-50 MG tablet Take 2 tablets by mouth 2 (two) times daily. (Patient taking differently: Take 2 tablets by mouth 2 (two) times daily as needed for mild constipation. ) 120 tablet 1  . azithromycin (ZITHROMAX) 250 MG tablet 500mg  x 1 doses then 250mg  po daily for 4 days 6 each 0   No current facility-administered medications for this visit.    REVIEW OF SYSTEMS:    10 Point review of Systems was done is negative except as noted above.  PHYSICAL EXAMINATION: ECOG PERFORMANCE STATUS: 1 - Symptomatic but completely ambulatory  . Filed Vitals:   11/20/15 1518  Weight: 177 lb 3.2 oz (80.377 kg)   Filed Weights   11/20/15 1518  Weight: 177 lb 3.2 oz (80.377 kg)   .Body mass index is 24.73 kg/(m^2).  GENERAL:alert, mild  emotional distress, feeling somewhat overwhelmed with the treatment. SKIN: skin color, texture, turgor are normal, no rashes or significant lesions EYES: normal, conjunctiva are pink and non-injected, sclera clear OROPHARYNX:no exudate, no erythema and lips, buccal mucosa, and tongue normal  NECK: supple, no JVD, thyroid normal size, non-tender, without nodularity LYMPH:  no palpable lymphadenopathy in the cervical, axillary or inguinal LUNGS: clear to auscultation with normal respiratory effort. HEART: regular rate & rhythm,  no murmurs and no lower extremity edema ABDOMEN: abdomen soft, non-tender, normoactive bowel sounds  Musculoskeletal: no cyanosis of digits and no clubbing  PSYCH: alert & oriented x 3 with fluent speech NEURO: no focal motor/sensory deficits  LABORATORY DATA:  I have reviewed the data as listed  . CBC Latest Ref Rng 11/20/2015 11/08/2015 11/06/2015  WBC 4.0 - 10.3 10e3/uL 20.0(H) 7.2 8.3  Hemoglobin 13.0 - 17.1 g/dL 11.1(L) 10.1(L) 10.6(L)  Hematocrit 38.4 - 49.9 %  34.9(L) 30.6(L) 32.3(L)  Platelets 140 - 400 10e3/uL 224 220 269    . CMP Latest Ref Rng 11/20/2015 11/08/2015 11/06/2015  Glucose 70 - 140 mg/dl 78 114(H) 145(H)  BUN 7.0 - 26.0 mg/dL 11.8 16 15   Creatinine 0.7 - 1.3 mg/dL 0.9 0.66 0.68  Sodium 136 - 145 mEq/L 142 142 139  Potassium 3.5 - 5.1 mEq/L 3.8 3.9 4.1  Chloride 101 - 111 mmol/L - 108 105  CO2 22 - 29 mEq/L 28 28 25   Calcium 8.4 - 10.4 mg/dL 9.4 8.7(L) 8.9  Total Protein 6.4 - 8.3 g/dL 6.8 5.4(L) 6.0(L)  Total Bilirubin 0.20 - 1.20 mg/dL <0.30 0.8 0.4  Alkaline Phos 40 - 150 U/L 101 39 50  AST 5 - 34 U/L 22 22 20   ALT 0 - 55 U/L 30 27 20    . Lab Results  Component Value Date   LDH 146 10/08/2015    RADIOGRAPHIC STUDIES: I have personally reviewed the radiological images as listed and agreed with the findings in the report. X-ray Chest Pa And Lateral  11/05/2015  CLINICAL DATA:  Lymphoma.  Abdominal adenopathy EXAM: CHEST  2  VIEW COMPARISON:  10/23/2015 FINDINGS: RIGHT-sided power port with tip in distal SVC unchanged. Normal mediastinum and cardiac silhouette. No effusion, infiltrate, or pneumothorax. IMPRESSION: No acute cardiopulmonary process. Electronically Signed   By: Suzy Bouchard M.D.   On: 11/05/2015 13:25   Dg Fluoro Guide Spinal/si Jt Inj Left  11/06/2015  Etheleen Mayhew, MD     11/06/2015 11:30 AM Lumbar puncture performed at L3-L4.  No CSF removed.  12 mg methotrexate injected intrathecally in total volume of 10 mL.  Needle removed.  No complications.  See full dictation in PACS.  ASSESSMENT & PLAN:   Patient is a 40 yo male admitted with   1) High risk Burkitt's lymphoma stage III with no CNS involvement. 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 Patient is status post 5 cycles of EPOCH-R and received 3 dose of intrathecal methotrexate for CNS prophylaxis. His LDH level is down from 1200s to 300 to 188 to 155 PET/CT 08/22/2015 after 2 cycles of treatment. Complete metabolic response. No residual hypermetabolic lymphoma. patient has no clinical evidence of disease progression at this time.  2) Acute sinusitis Plan -given Z-Pak for acute sinusitis Setting of chemotherapy was on immunosuppression. -Due to his acute sinusitis and his preference we will delay his sixth and final cycle of EPOCH-R to start on 12/03/2015 --Will plan to continue CNS prophylaxis with intrathecal methotrexate on day 1 -Neulasta on day 7. -he will need a repeat PET/CT scan 3-4 weeks after completion of treatment to have post treatment baseline. -repeat LDH on 12/03/2015. -Given issues with constipation aggressive Bowel prophylaxis with senna S and MiraLAX. patient counseled to continue eating soft foods due to his partial small bowel obstruction-like symptoms on a recent admission.  All of the patients and his fiances questions were answered  to their apparent satisfaction. The patient knows to call the clinic with any problems, questions or concerns.  I spent 20 minutes counseling the patient face to face. The total time spent in the appointment was 25 minutes and more than 50% was on counseling and direct patient cares.    Sullivan Lone MD [place] AAHIVMS [facility] Hematology/Oncology Physician [place]  (Office):       [phone] (Work cell):  [phone] (Fax):  [phone]    

## 2015-12-03 ENCOUNTER — Inpatient Hospital Stay (HOSPITAL_COMMUNITY)
Admission: AD | Admit: 2015-12-03 | Discharge: 2015-12-07 | DRG: 847 | Disposition: A | Discharge status: Home / Self Care | Payer: BLUE CROSS/BLUE SHIELD | Source: Ambulatory Visit | Attending: Hematology | Admitting: Hematology

## 2015-12-03 ENCOUNTER — Encounter (HOSPITAL_COMMUNITY): Payer: Self-pay

## 2015-12-03 ENCOUNTER — Other Ambulatory Visit: Payer: Self-pay | Admitting: Hematology

## 2015-12-03 DIAGNOSIS — C8378 Burkitt lymphoma, lymph nodes of multiple sites: Secondary | ICD-10-CM

## 2015-12-03 DIAGNOSIS — Z8249 Family history of ischemic heart disease and other diseases of the circulatory system: Secondary | ICD-10-CM | POA: Diagnosis not present

## 2015-12-03 DIAGNOSIS — R1084 Generalized abdominal pain: Secondary | ICD-10-CM

## 2015-12-03 DIAGNOSIS — R112 Nausea with vomiting, unspecified: Secondary | ICD-10-CM

## 2015-12-03 DIAGNOSIS — Z5111 Encounter for antineoplastic chemotherapy: Secondary | ICD-10-CM | POA: Diagnosis present

## 2015-12-03 DIAGNOSIS — T451X5A Adverse effect of antineoplastic and immunosuppressive drugs, initial encounter: Secondary | ICD-10-CM | POA: Insufficient documentation

## 2015-12-03 DIAGNOSIS — C8370 Burkitt lymphoma, unspecified site: Secondary | ICD-10-CM | POA: Diagnosis present

## 2015-12-03 DIAGNOSIS — Z881 Allergy status to other antibiotic agents status: Secondary | ICD-10-CM

## 2015-12-03 DIAGNOSIS — R110 Nausea: Secondary | ICD-10-CM | POA: Diagnosis not present

## 2015-12-03 DIAGNOSIS — D6481 Anemia due to antineoplastic chemotherapy: Secondary | ICD-10-CM | POA: Diagnosis present

## 2015-12-03 DIAGNOSIS — Z7952 Long term (current) use of systemic steroids: Secondary | ICD-10-CM

## 2015-12-03 DIAGNOSIS — C8373 Burkitt lymphoma, intra-abdominal lymph nodes: Secondary | ICD-10-CM

## 2015-12-03 DIAGNOSIS — Z882 Allergy status to sulfonamides status: Secondary | ICD-10-CM | POA: Diagnosis not present

## 2015-12-03 LAB — CBC WITH DIFFERENTIAL/PLATELET
BASOS ABS: 0 10*3/uL (ref 0.0–0.1)
Basophils Relative: 1 %
EOS PCT: 1 %
Eosinophils Absolute: 0 10*3/uL (ref 0.0–0.7)
HEMATOCRIT: 31.4 % — AB (ref 39.0–52.0)
Hemoglobin: 10 g/dL — ABNORMAL LOW (ref 13.0–17.0)
LYMPHS ABS: 0.7 10*3/uL (ref 0.7–4.0)
LYMPHS PCT: 14 %
MCH: 29.2 pg (ref 26.0–34.0)
MCHC: 31.8 g/dL (ref 30.0–36.0)
MCV: 91.5 fL (ref 78.0–100.0)
MONO ABS: 0.8 10*3/uL (ref 0.1–1.0)
Monocytes Relative: 16 %
NEUTROS ABS: 3.5 10*3/uL (ref 1.7–7.7)
Neutrophils Relative %: 68 %
PLATELETS: 263 10*3/uL (ref 150–400)
RBC: 3.43 MIL/uL — ABNORMAL LOW (ref 4.22–5.81)
RDW: 14.2 % (ref 11.5–15.5)
WBC: 5 10*3/uL (ref 4.0–10.5)

## 2015-12-03 LAB — COMPREHENSIVE METABOLIC PANEL
ALBUMIN: 3.7 g/dL (ref 3.5–5.0)
ALK PHOS: 56 U/L (ref 38–126)
ALT: 21 U/L (ref 17–63)
AST: 22 U/L (ref 15–41)
Anion gap: 8 (ref 5–15)
BUN: 15 mg/dL (ref 6–20)
CALCIUM: 8.9 mg/dL (ref 8.9–10.3)
CHLORIDE: 106 mmol/L (ref 101–111)
CO2: 27 mmol/L (ref 22–32)
CREATININE: 0.59 mg/dL — AB (ref 0.61–1.24)
GFR calc Af Amer: >60 mL/min (ref >60)
GFR calc non Af Amer: >60 mL/min (ref >60)
GLUCOSE: 94 mg/dL (ref 65–99)
Potassium: 4.1 mmol/L (ref 3.5–5.1)
SODIUM: 141 mmol/L (ref 135–145)
Total Bilirubin: 0.2 mg/dL — ABNORMAL LOW (ref 0.3–1.2)
Total Protein: 5.8 g/dL — ABNORMAL LOW (ref 6.5–8.1)

## 2015-12-03 LAB — LACTATE DEHYDROGENASE: LDH: 158 U/L (ref 98–192)

## 2015-12-03 LAB — PHOSPHORUS: PHOSPHORUS: 3.5 mg/dL (ref 2.5–4.6)

## 2015-12-03 LAB — MAGNESIUM: Magnesium: 1.9 mg/dL (ref 1.7–2.4)

## 2015-12-03 MED ORDER — SODIUM CHLORIDE 0.9 % IJ SOLN: 3.0000 mL | INTRAMUSCULAR | Status: DC | PRN

## 2015-12-03 MED ORDER — SODIUM CHLORIDE 0.9 % IV SOLN
INTRAVENOUS | Status: DC
  Administered 2015-12-03: 13:00:00 via INTRAVENOUS

## 2015-12-03 MED ORDER — VINCRISTINE SULFATE CHEMO INJECTION 1 MG/ML
Freq: Once | INTRAVENOUS | Status: AC
  Administered 2015-12-03: 14:00:00 via INTRAVENOUS
  Filled 2015-12-03: qty 10

## 2015-12-03 MED ORDER — OXYCODONE HCL ER 10 MG PO T12A
10.0000 mg | EXTENDED_RELEASE_TABLET | Freq: Two times a day (BID) | ORAL | Status: DC
  Administered 2015-12-03 – 2015-12-06 (×5): 10 mg via ORAL
  Filled 2015-12-03 (×7): qty 1

## 2015-12-03 MED ORDER — SODIUM CHLORIDE 0.9 % IJ SOLN: 10.0000 mL | INTRAMUSCULAR | Status: DC | PRN

## 2015-12-03 MED ORDER — POLYETHYLENE GLYCOL 3350 17 G PO PACK
17.0000 g | PACK | Freq: Every day | ORAL | Status: DC
  Filled 2015-12-03 (×2): qty 1

## 2015-12-03 MED ORDER — PANTOPRAZOLE SODIUM 40 MG PO TBEC
40.0000 mg | DELAYED_RELEASE_TABLET | Freq: Every day | ORAL | Status: DC
  Administered 2015-12-04 – 2015-12-06 (×3): 40 mg via ORAL
  Filled 2015-12-03 (×3): qty 1

## 2015-12-03 MED ORDER — ALUM & MAG HYDROXIDE-SIMETH 200-200-20 MG/5ML PO SUSP: 60.0000 mL | ORAL | Status: DC | PRN

## 2015-12-03 MED ORDER — HOT PACK MISC ONCOLOGY: 1.0000 | Freq: Once | Status: DC | PRN

## 2015-12-03 MED ORDER — PREDNISONE 50 MG PO TABS
100.0000 mg | ORAL_TABLET | Freq: Every day | ORAL | Status: AC
  Administered 2015-12-03 – 2015-12-07 (×5): 100 mg via ORAL
  Filled 2015-12-03 (×4): qty 2

## 2015-12-03 MED ORDER — SENNOSIDES-DOCUSATE SODIUM 8.6-50 MG PO TABS
2.0000 | ORAL_TABLET | Freq: Two times a day (BID) | ORAL | Status: DC
  Administered 2015-12-03: 2 via ORAL
  Administered 2015-12-04: 1 via ORAL
  Administered 2015-12-05: 2 via ORAL
  Administered 2015-12-06: 1 via ORAL
  Filled 2015-12-03 (×6): qty 2

## 2015-12-03 MED ORDER — ALTEPLASE 2 MG IJ SOLR
2.0000 mg | Freq: Once | INTRAMUSCULAR | Status: DC | PRN
Start: 2015-12-03 — End: 2015-12-07

## 2015-12-03 MED ORDER — COLD PACK MISC ONCOLOGY: 1.0000 | Freq: Once | Status: DC | PRN

## 2015-12-03 MED ORDER — HEPARIN SOD (PORK) LOCK FLUSH 100 UNIT/ML IV SOLN
500.0000 [IU] | Freq: Once | INTRAVENOUS | Status: DC | PRN
  Filled 2015-12-03: qty 5

## 2015-12-03 MED ORDER — HYDROCODONE-ACETAMINOPHEN 7.5-325 MG PO TABS
1.0000 | ORAL_TABLET | Freq: Four times a day (QID) | ORAL | Status: DC | PRN
  Administered 2015-12-04: 1 via ORAL
  Filled 2015-12-03: qty 1

## 2015-12-03 MED ORDER — SODIUM CHLORIDE 0.9 % IV SOLN
Freq: Once | INTRAVENOUS | Status: AC
  Administered 2015-12-03: 14:00:00 via INTRAVENOUS
  Filled 2015-12-03: qty 4

## 2015-12-03 MED ORDER — HEPARIN SOD (PORK) LOCK FLUSH 100 UNIT/ML IV SOLN: 250.0000 [IU] | Freq: Once | INTRAVENOUS | Status: DC | PRN

## 2015-12-03 NOTE — Progress Notes (Signed)
Chemotherapy dosage calculation verified with Regina Baldwin, RN. 

## 2015-12-04 ENCOUNTER — Inpatient Hospital Stay (HOSPITAL_COMMUNITY): Payer: BLUE CROSS/BLUE SHIELD

## 2015-12-04 ENCOUNTER — Other Ambulatory Visit: Payer: Self-pay | Admitting: Diagnostic Radiology

## 2015-12-04 DIAGNOSIS — C8373 Burkitt lymphoma, intra-abdominal lymph nodes: Secondary | ICD-10-CM | POA: Insufficient documentation

## 2015-12-04 LAB — CBC
HCT: 32.9 % — ABNORMAL LOW (ref 39.0–52.0)
Hemoglobin: 10.6 g/dL — ABNORMAL LOW (ref 13.0–17.0)
MCH: 29.9 pg (ref 26.0–34.0)
MCHC: 32.2 g/dL (ref 30.0–36.0)
MCV: 92.7 fL (ref 78.0–100.0)
PLATELETS: 273 10*3/uL (ref 150–400)
RBC: 3.55 MIL/uL — AB (ref 4.22–5.81)
RDW: 14.1 % (ref 11.5–15.5)
WBC: 7.4 10*3/uL (ref 4.0–10.5)

## 2015-12-04 LAB — COMPREHENSIVE METABOLIC PANEL
ALBUMIN: 3.6 g/dL (ref 3.5–5.0)
ALT: 19 U/L (ref 17–63)
AST: 19 U/L (ref 15–41)
Alkaline Phosphatase: 46 U/L (ref 38–126)
Anion gap: 7 (ref 5–15)
BUN: 14 mg/dL (ref 6–20)
CHLORIDE: 108 mmol/L (ref 101–111)
CO2: 26 mmol/L (ref 22–32)
CREATININE: 0.58 mg/dL — AB (ref 0.61–1.24)
Calcium: 8.9 mg/dL (ref 8.9–10.3)
GFR calc Af Amer: >60 mL/min (ref >60)
Glucose, Bld: 123 mg/dL — ABNORMAL HIGH (ref 65–99)
POTASSIUM: 4.1 mmol/L (ref 3.5–5.1)
SODIUM: 141 mmol/L (ref 135–145)
Total Bilirubin: 0.2 mg/dL — ABNORMAL LOW (ref 0.3–1.2)
Total Protein: 5.9 g/dL — ABNORMAL LOW (ref 6.5–8.1)

## 2015-12-04 LAB — CSF CELL COUNT WITH DIFFERENTIAL
RBC COUNT CSF: 84 /mm3 — AB
TUBE #: 1
WBC CSF: 2 /mm3 (ref 0–5)

## 2015-12-04 LAB — PROTEIN, CSF: TOTAL PROTEIN, CSF: 22 mg/dL (ref 15–45)

## 2015-12-04 MED ORDER — DEXAMETHASONE SODIUM PHOSPHATE 100 MG/10ML IJ SOLN
Freq: Once | INTRAMUSCULAR | Status: AC
  Administered 2015-12-04: 13:00:00 via INTRAVENOUS
  Filled 2015-12-04: qty 4

## 2015-12-04 MED ORDER — LORAZEPAM 0.5 MG PO TABS
0.5000 mg | ORAL_TABLET | Freq: Once | ORAL | Status: AC
  Administered 2015-12-04: 0.5 mg via ORAL
  Filled 2015-12-04: qty 1

## 2015-12-04 MED ORDER — SODIUM CHLORIDE 0.9 % IJ SOLN
Freq: Once | INTRAMUSCULAR | Status: AC
  Administered 2015-12-04: 15:00:00 via INTRATHECAL
  Filled 2015-12-04: qty 0.48

## 2015-12-04 MED ORDER — VINCRISTINE SULFATE CHEMO INJECTION 1 MG/ML
Freq: Once | INTRAVENOUS | Status: AC
  Administered 2015-12-04: 13:00:00 via INTRAVENOUS
  Filled 2015-12-04: qty 10

## 2015-12-04 MED ORDER — SODIUM CHLORIDE 0.9 % IV SOLN: 150.0000 mg | Freq: Once | INTRAVENOUS | Status: DC

## 2015-12-04 MED ORDER — SODIUM CHLORIDE 0.9 % IV SOLN
150.0000 mg | Freq: Once | INTRAVENOUS | Status: AC
  Administered 2015-12-05: 150 mg via INTRAVENOUS
  Filled 2015-12-04: qty 5

## 2015-12-04 NOTE — Procedures (Signed)
PROCEDURE: Informed consent was obtained from the patient prior to the procedure, including potential complications of headache, allergy, and pain. With the patient prone, the lower back was prepped with Betadine. 1% Lidocaine was used for local anesthesia. Lumbar puncture was performed at the L3-4 level using a gauge needle with return of clearCSF. 10 cc of methotrexate was injected into the subarachnoid space. The patient tolerated the procedure well without apparent complication.

## 2015-12-04 NOTE — Progress Notes (Signed)
[name]   HEMATOLOGY/ONCOLOGY INPATIENT PROGRESS NOTE  Date of Service: 12/04/2015  Inpatient Attending: .Brunetta Genera, MD   SUBJECTIVE   Patient was seen this AM just prior to going down for his scheduled intrathecal MTX. He was feeling well and had no acute concerns. Is keen to get done with his chemotherapy. He notes that receiving day 3 and day 5 Emend with the previous cycle really helped his nausea. Ordered.   OBJECTIVE:  NAD  PHYSICAL EXAMINATION: . Filed Vitals:   12/03/15 1011 12/03/15 2033 12/04/15 0634 12/04/15 1435  BP:  126/65 97/67 130/67  Pulse:  70 59 71  Temp:  98.5 F (36.9 C) 98.3 F (36.8 C) 98.6 F (37 C)  TempSrc:  Oral Oral Oral  Resp:  16 18 16   Height:      Weight: 182 lb (82.555 kg)     SpO2:  98% 97% 97%   Filed Weights   12/03/15 1011  Weight: 182 lb (82.555 kg)   .Body mass index is 25.03 kg/(m^2).  GENERAL:alert, in no acute distress and comfortable SKIN: skin color, texture, turgor are normal, no rashes or significant lesions EYES: normal, conjunctiva are pink and non-injected, sclera clear OROPHARYNX:no exudate, no erythema and lips, buccal mucosa, and tongue normal  NECK: supple, no JVD, thyroid normal size, non-tender, without nodularity LYMPH:  no palpable lymphadenopathy in the cervical, axillary or inguinal LUNGS: clear to auscultation with normal respiratory effort HEART: regular rate & rhythm,  no murmurs and no lower extremity edema ABDOMEN: abdomen soft, non-tender, normoactive bowel sounds  Musculoskeletal: no cyanosis of digits and no clubbing  PSYCH: alert & oriented x 3 with fluent speech NEURO: no focal motor/sensory deficits  MEDICAL HISTORY:  Past Medical History  Diagnosis Date  . EBV infection 2013    Patient notes that he had an EBV infection in 2013 that left him with chronic fatigue. She also notes that he had significant lymphadenopathy and thyroiditis at the time. He has been managing his symptoms with an  alternative medicine practitioner in [place] and with other alternative medicines.  . Marijuana use, episodic   . Complication of anesthesia   . Burkitt lymphoma ([place]) 08/15/2015    SURGICAL HISTORY: Past Surgical History  Procedure Laterality Date  . Tonsillectomy    . Appendectomy    . Laparoscopy N/A 07/20/2015    Procedure: LAPAROSCOPY DIAGNOSTIC, MULTIPLE BIOPSIES, DRAINAGE OF ASCITES;  Surgeon: Fanny Skates, MD;  Location: [facility];  Service: General;  Laterality: N/A;    SOCIAL HISTORY: Social History   Social History  . Marital Status: Married    Spouse Name: N/A  . Number of Children: N/A  . Years of Education: N/A   Occupational History  . Not on file.   Social History Main Topics  . Smoking status: Never Smoker   . Smokeless tobacco: Never Used  . Alcohol Use: No  . Drug Use: 1.00 per week    Special: Marijuana  . Sexual Activity: Yes   Other Topics Concern  . 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   He has a fiance and has been in a steady relationship.      He has tried to maintain a very healthy lifestyle and cycles about 100 miles a week. He also uses a fair number of over-the-counter alternative medicines to stay healthy.    FAMILY HISTORY: Family History  Problem Relation Age of Onset  . Heart failure Father     ALLERGIES:  is allergic to levaquin and sulfa antibiotics.  MEDICATIONS:  Scheduled Meds: . DOXOrubicin/vinCRIStine/etoposide CHEMO IV infusion for Inpatient CI   Intravenous Once  . fosaprepitant (EMEND) IV infusion 150 mg  150 mg Intravenous Once  . [START ON 12/06/2015] fosaprepitant (EMEND) IV infusion 150 mg  150 mg Intravenous Once  . methotrexate INTRATHECAL (+/- HYDROCORTISONE,Ara-C)   Intrathecal Once  . oxyCODONE  10 mg Oral Q12H  . pantoprazole  40 mg Oral Q breakfast  . polyethylene glycol  17 g Oral Daily  . predniSONE   100 mg Oral QAC breakfast  . senna-docusate  2 tablet Oral BID   Continuous Infusions: . sodium chloride 10 mL/hr at 12/03/15 1258   PRN Meds:.alteplase, alum & mag hydroxide-simeth, Cold Pack, heparin lock flush, heparin lock flush, Hot Pack, HYDROcodone-acetaminophen, sodium chloride, sodium chloride  REVIEW OF SYSTEMS:    10 Point review of Systems was done is negative except as noted above.   LABORATORY DATA:  I have reviewed the data as listed  . CBC Latest Ref Rng 12/04/2015 12/03/2015 11/20/2015  WBC 4.0 - 10.5 K/uL 7.4 5.0 20.0(H)  Hemoglobin 13.0 - 17.0 g/dL 10.6(L) 10.0(L) 11.1(L)  Hematocrit 39.0 - 52.0 % 32.9(L) 31.4(L) 34.9(L)  Platelets 150 - 400 K/uL 273 263 224    . CMP Latest Ref Rng 12/04/2015 12/03/2015 11/20/2015  Glucose 65 - 99 mg/dL 123(H) 94 78  BUN 6 - 20 mg/dL 14 15 11.8  Creatinine 0.61 - 1.24 mg/dL 0.58(L) 0.59(L) 0.9  Sodium 135 - 145 mmol/L 141 141 142  Potassium 3.5 - 5.1 mmol/L 4.1 4.1 3.8  Chloride 101 - 111 mmol/L 108 106 -  CO2 22 - 32 mmol/L 26 27 28   Calcium 8.9 - 10.3 mg/dL 8.9 8.9 9.4  Total Protein 6.5 - 8.1 g/dL 5.9(L) 5.8(L) 6.8  Total Bilirubin 0.3 - 1.2 mg/dL 0.2(L) 0.2(L) <0.30  Alkaline Phos 38 - 126 U/L 46 56 101  AST 15 - 41 U/L 19 22 22   ALT 17 - 63 U/L 19 21 30    . Lab Results  Component Value Date   LDH 158 12/03/2015     RADIOGRAPHIC STUDIES: I have personally reviewed the radiological images as listed and agreed with the findings in the report. X-ray Chest Pa And Lateral  11/05/2015  CLINICAL DATA:  Lymphoma.  Abdominal adenopathy EXAM: CHEST  2 VIEW COMPARISON:  10/23/2015 FINDINGS: RIGHT-sided power port with tip in distal SVC unchanged. Normal mediastinum and cardiac silhouette. No effusion, infiltrate, or pneumothorax. IMPRESSION: No acute cardiopulmonary process. Electronically Signed   By: Suzy Bouchard M.D.   On: 11/05/2015 13:25   Dg Fluoro Guide Spinal/si Jt Inj Left  11/06/2015  Etheleen Mayhew, MD      11/06/2015 11:30 AM Lumbar puncture performed at L3-L4.  No CSF removed.  12 mg methotrexate injected intrathecally in total volume of 10 mL.  Needle removed.  No complications.  See full dictation in PACS.  Dg Fluoro Guided Loc Of Needle/cath Tip For Spinal Inject Rt  12/04/2015  CLINICAL DATA:  Burkitt's lymphoma EXAM: FLUOROSCOPICALLY GUIDED LUMBAR PUNCTURE FOR INTRATHECAL CHEMOTHERAPY FLUOROSCOPY TIME:  1 minutes 7 seconds PROCEDURE: Informed consent was obtained from the patient prior to the procedure, including potential complications of headache, allergy, and pain. With the patient prone, the lower back was prepped with Betadine. 1% Lidocaine was used for local anesthesia. Lumbar puncture was performed at  the L3-4 level using a gauge needle with return of clearCSF. 10 cc of methotrexate was injected into the subarachnoid space. The patient tolerated the procedure well without apparent complication. IMPRESSION: Technically successful lumbar puncture with removal of 9 cc of clear CSF and intrathecal administration of 10 cc of methotrexate. Electronically Signed   By: Kerby Moors M.D.   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 Cytogenetics showed Myc Break apart event associated with chromosomal variants of Burkitt's lymphoma t(2;8) and t(8;22). Patient is status post 5 cycles of EPOCH-R and received 3 dose of intrathecal methotrexate for CNS prophylaxis. His LDH level is down from 1200s to 300 to 188 to 155 PET/CT 08/22/2015 after 2 cycles of treatment. Complete metabolic response. No residual hypermetabolic lymphoma. patient has no clinical evidence of disease progression at this time. His LDH level today is within normal limits. Plan -patient is doing well with no prohibitive toxicities. -continuing on c6 D2 of EPOCH-R chemotherapy -received IT methotrexate today for CNS prophylaxis. -Emend D3 and D5 -will be  getting Rituxan on D5. -Neulasta on day 7. -continue to monitoring on chemotherapy.   I spent 25 minutes counseling the patient face to face. The total time spent in the appointment was 25 minutes and more than 50% was on counseling and direct patient cares.    Sullivan Lone MD [place] AAHIVMS [facility] Hematology/Oncology Physician [facility]  (Office):       [phone] (Work cell):  [phone] (Fax):           [phone]

## 2015-12-04 NOTE — Progress Notes (Signed)
Pt for Doxarubicin 10 mg/m2, Etoposside 50 mg/m2 and Vincristine 0.4 mg/m2, whiich was verified against 3 pt identifiers and for dosage. This was verified by Laural Benes RN

## 2015-12-04 NOTE — H&P (Signed)
[name]    HEMATOLOGY/ONCOLOGY CONSULTATION NOTE  Date of Service: 12/03/2015  Patient Care Team: [name], DO as PCP - General (Internal Medicine)  CHIEF COMPLAINTS/PURPOSE OF CONSULTATION:  Burkitt lymphoma admitted for 6th cycle of EPOCH-R chemotherapy and IT methotrexate for prophylaxis  HISTORY OF PRESENTING ILLNESS:  Donald [name] is a wonderful 40 y.o. male who has been admitted for his sixth cycle of EPOCH-R chemotherapy for Burkitt's lymphoma and intrathecal methotrexate for CNS prophylaxis. He was seen in clinic on 11/26/2015 and had an acute sinusitis and was treated with Z-Pak. His upper respiratory tract infection seems to have completed resolved at this time.  No fevers or chills.  Minimal sinus drainage.  He feels ready to complete his chemotherapy.  Notes no other acute concerns at this time.  Some generalized muscle aches.  Notes that he has been very busy the past week or so getting his new house completed. He notes that during the last cycle he did receive additional Emend day 3 and 5 which he notes helped his nausea.  MEDICAL HISTORY:  Past Medical History  Diagnosis Date  . EBV infection 2013    Patient notes that he had an EBV infection in 2013 that left him with chronic fatigue. She also notes that he had significant lymphadenopathy and thyroiditis at the time. He has been managing his symptoms with an alternative medicine practitioner in [place] and with other alternative medicines.  . Marijuana use, episodic   . Complication of anesthesia   . Burkitt lymphoma ([place]) 08/15/2015    SURGICAL HISTORY: Past Surgical History  Procedure Laterality Date  . Tonsillectomy    . Appendectomy    . Laparoscopy N/A 07/20/2015    Procedure: LAPAROSCOPY DIAGNOSTIC, MULTIPLE BIOPSIES, DRAINAGE OF ASCITES;  Surgeon: Fanny Skates, MD;  Location: [facility];  Service: General;  Laterality: N/A;    SOCIAL HISTORY: Social History   Social History  . Marital Status: Married    Spouse Name: N/A  . Number of Children: N/A  . Years of Education: N/A   Occupational History  . Not on file.   Social History Main Topics  . Smoking status: Never Smoker   . Smokeless tobacco: Never Used  . Alcohol Use: No  . Drug Use: 1.00 per week    Special: Marijuana  . Sexual Activity: Yes   Other Topics Concern  . 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   He has a fiance and has been in a steady relationship.      He has tried to maintain a very healthy lifestyle and cycles about 100 miles a week. He also uses a fair number of over-the-counter alternative medicines to stay healthy.    FAMILY HISTORY: Family History  Problem Relation Age of Onset  . Heart failure Father     ALLERGIES:  is allergic to levaquin and sulfa antibiotics.  MEDICATIONS:  Current Facility-Administered Medications  Medication Dose Route Frequency Provider Last Rate Last Dose  . 0.9 %  sodium chloride infusion   Intravenous Continuous Brunetta Genera, MD 10 mL/hr at 12/03/15 1258    . alteplase (CATHFLO ACTIVASE) injection 2 mg  2 mg Intracatheter Once PRN Brunetta Genera, MD      . alum & mag hydroxide-simeth (MAALOX/MYLANTA) 200-200-20 MG/5ML suspension 60 mL  60 mL Oral Q4H PRN Brunetta Genera, MD      .  Cold Pack 1 packet  1 packet Topical Once PRN Brunetta Genera, MD      . DOXOrubicin (ADRIAMYCIN) 20 mg, etoposide (VEPESID) 102 mg, vinCRIStine (ONCOVIN) 0.8 mg in sodium chloride 0.9 % 500 mL chemo infusion   Intravenous Once Brunetta Genera, MD 23 mL/hr at 12/03/15 1406    . heparin lock flush 100 unit/mL  500 Units Intracatheter Once PRN Brunetta Genera, MD      . heparin lock flush 100 unit/mL  250 Units Intracatheter Once PRN Brunetta Genera, MD      . Hot Pack 1 packet  1 packet Topical Once PRN Brunetta Genera, MD      . HYDROcodone-acetaminophen (Floris)  7.5-325 MG per tablet 1-2 tablet  1-2 tablet Oral Q6H PRN Brunetta Genera, MD      . oxyCODONE (OXYCONTIN) 12 hr tablet 10 mg  10 mg Oral Q12H Brunetta Genera, MD   10 mg at 12/03/15 2158  . pantoprazole (PROTONIX) EC tablet 40 mg  40 mg Oral Q breakfast Brunetta Genera, MD      . polyethylene glycol (MIRALAX / GLYCOLAX) packet 17 g  17 g Oral Daily Brunetta Genera, MD   17 g at 12/03/15 1045  . predniSONE (DELTASONE) tablet 100 mg  100 mg Oral QAC breakfast Brunetta Genera, MD   100 mg at 12/03/15 1257  . senna-docusate (Senokot-S) tablet 2 tablet  2 tablet Oral BID Brunetta Genera, MD   2 tablet at 12/03/15 1257  . sodium chloride 0.9 % injection 10 mL  10 mL Intracatheter PRN Brunetta Genera, MD      . sodium chloride 0.9 % injection 3 mL  3 mL Intravenous PRN Suzan Slick Juleen China, MD        REVIEW OF SYSTEMS:    10 Point review of Systems was done is negative except as noted above.  PHYSICAL EXAMINATION: ECOG PERFORMANCE STATUS: 1 - Symptomatic but completely ambulatory  . Filed Vitals:   12/03/15 0956 12/03/15 2033  BP: 123/59 126/65  Pulse: 67 70  Temp: 98.6 F (37 C) 98.5 F (36.9 C)  Resp: 18 16   Filed Weights   12/03/15 1011  Weight: 182 lb (82.555 kg)   .Body mass index is 25.03 kg/(m^2).  GENERAL:alert, in no acute distress and comfortable SKIN: skin color, texture, turgor are normal, no rashes or significant lesions EYES: normal, conjunctiva are pink and non-injected, sclera clear OROPHARYNX:no exudate, no erythema and lips, buccal mucosa, and tongue normal  NECK: supple, no JVD, thyroid normal size, non-tender, without nodularity LYMPH:  no palpable lymphadenopathy in the cervical, axillary or inguinal LUNGS: clear to auscultation with normal respiratory effort HEART: regular rate & rhythm,  no murmurs and no lower extremity edema ABDOMEN: abdomen soft, non-tender, normoactive bowel sounds  Musculoskeletal: no cyanosis of digits and  no clubbing  PSYCH: alert & oriented x 3 with fluent speech NEURO: no focal motor/sensory deficits  LABORATORY DATA:  I have reviewed the data as listed  . CBC Latest Ref Rng 12/03/2015 11/20/2015 11/08/2015  WBC 4.0 - 10.5 K/uL 5.0 20.0(H) 7.2  Hemoglobin 13.0 - 17.0 g/dL 10.0(L) 11.1(L) 10.1(L)  Hematocrit 39.0 - 52.0 % 31.4(L) 34.9(L) 30.6(L)  Platelets 150 - 400 K/uL 263 224 220    . CMP Latest Ref Rng 12/03/2015 11/20/2015 11/08/2015  Glucose 65 - 99 mg/dL 94 78 114(H)  BUN 6 - 20 mg/dL 15 11.8 16  Creatinine 0.61 - 1.24  mg/dL 0.59(L) 0.9 0.66  Sodium 135 - 145 mmol/L 141 142 142  Potassium 3.5 - 5.1 mmol/L 4.1 3.8 3.9  Chloride 101 - 111 mmol/L 106 - 108  CO2 22 - 32 mmol/L 27 28 28   Calcium 8.9 - 10.3 mg/dL 8.9 9.4 8.7(L)  Total Protein 6.5 - 8.1 g/dL 5.8(L) 6.8 5.4(L)  Total Bilirubin 0.3 - 1.2 mg/dL 0.2(L) <0.30 0.8  Alkaline Phos 38 - 126 U/L 56 101 39  AST 15 - 41 U/L 22 22 22   ALT 17 - 63 U/L 21 30 27    . Lab Results  Component Value Date   LDH 158 12/03/2015     RADIOGRAPHIC STUDIES: I have personally reviewed the radiological images as listed and agreed with the findings in the report. X-ray Chest Pa And Lateral  11/05/2015  CLINICAL DATA:  Lymphoma.  Abdominal adenopathy EXAM: CHEST  2 VIEW COMPARISON:  10/23/2015 FINDINGS: RIGHT-sided power port with tip in distal SVC unchanged. Normal mediastinum and cardiac silhouette. No effusion, infiltrate, or pneumothorax. IMPRESSION: No acute cardiopulmonary process. Electronically Signed   By: Suzy Bouchard M.D.   On: 11/05/2015 13:25   Dg Fluoro Guide Spinal/si Jt Inj Left  11/06/2015  Etheleen Mayhew, MD     11/06/2015 11:30 AM Lumbar puncture performed at L3-L4.  No CSF removed.  12 mg methotrexate injected intrathecally in total volume of 10 mL.  Needle removed.  No complications.  See full dictation in PACS.   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 Cytogenetics showed Myc Break apart event associated with chromosomal variants of Burkitt's lymphoma t(2;8) and t(8;22). Patient is status post 5 cycles of EPOCH-R and received 3 dose of intrathecal methotrexate for CNS prophylaxis. His LDH level is down from 1200s to 300 to 188 to 155 PET/CT 08/22/2015 after 2 cycles of treatment. Complete metabolic response. No residual hypermetabolic lymphoma. patient has no clinical evidence of disease progression at this time. His LDH level today is within normal limits.  2) Acute sinusitis - Noted on his last clinic visit.  Treated with Z-Pak with resolution.  3) generalized abdominal soreness.  Previously was noted to have a partial small bowel obstruction possibly due to his tumor. He has been moving his bowels well with no nausea or vomiting at this time. Plan -labs stable.  Patient is appropriate to proceed with his sixth and final cycle of EPOCH-R to start on 12/03/2015 --Will plan to continue CNS prophylaxis with intrathecal methotrexate on day 1 or 2.  request placed for radiology to do a diagnostic and therapeutic lumbar puncture. -Neulasta on day 7. -he will need a repeat PET/CT scan 4 weeks after completion of treatment to have post treatment baseline. -Given issues with constipation aggressive Bowel prophylaxis with senna S and MiraLAX. - patient counseled to continue eating soft foods due to his partial small bowel obstruction-like symptoms on a recent admission.  All of the patients questions were answered to his apparent satisfaction. The patient knows to call the clinic with any problems, questions or concerns.  I spent 50 minutes counseling the patient face to face. The total time spent in the appointment was 60 minutes and more than 50% was on counseling and direct patient cares.    Sullivan Lone MD MS AAHIVMS [facility] Hematology/Oncology Physician [facility]  (Office):        [phone] (Work  cell):  [phone] (Fax):           [phone]

## 2015-12-05 LAB — CBC
HCT: 33.9 % — ABNORMAL LOW (ref 39.0–52.0)
HEMOGLOBIN: 10.8 g/dL — AB (ref 13.0–17.0)
MCH: 29.7 pg (ref 26.0–34.0)
MCHC: 31.9 g/dL (ref 30.0–36.0)
MCV: 93.1 fL (ref 78.0–100.0)
Platelets: 278 10*3/uL (ref 150–400)
RBC: 3.64 MIL/uL — AB (ref 4.22–5.81)
RDW: 14.4 % (ref 11.5–15.5)
WBC: 6.1 10*3/uL (ref 4.0–10.5)

## 2015-12-05 LAB — COMPREHENSIVE METABOLIC PANEL
ALBUMIN: 3.6 g/dL (ref 3.5–5.0)
ALK PHOS: 45 U/L (ref 38–126)
ALT: 21 U/L (ref 17–63)
AST: 20 U/L (ref 15–41)
Anion gap: 9 (ref 5–15)
BUN: 18 mg/dL (ref 6–20)
CALCIUM: 8.7 mg/dL — AB (ref 8.9–10.3)
CO2: 24 mmol/L (ref 22–32)
CREATININE: 0.66 mg/dL (ref 0.61–1.24)
Chloride: 106 mmol/L (ref 101–111)
GFR calc Af Amer: >60 mL/min (ref >60)
GFR calc non Af Amer: >60 mL/min (ref >60)
GLUCOSE: 128 mg/dL — AB (ref 65–99)
Potassium: 3.9 mmol/L (ref 3.5–5.1)
SODIUM: 139 mmol/L (ref 135–145)
Total Bilirubin: 0.3 mg/dL (ref 0.3–1.2)
Total Protein: 5.8 g/dL — ABNORMAL LOW (ref 6.5–8.1)

## 2015-12-05 MED ORDER — SODIUM CHLORIDE 0.9 % IV SOLN
Freq: Once | INTRAVENOUS | Status: AC
  Administered 2015-12-05: 15:00:00 via INTRAVENOUS
  Filled 2015-12-05: qty 4

## 2015-12-05 MED ORDER — FOSAPREPITANT DIMEGLUMINE INJECTION 150 MG
150.0000 mg | Freq: Once | INTRAVENOUS | Status: AC
  Administered 2015-12-07: 150 mg via INTRAVENOUS
  Filled 2015-12-05: qty 5

## 2015-12-05 MED ORDER — VINCRISTINE SULFATE CHEMO INJECTION 1 MG/ML
Freq: Once | INTRAVENOUS | Status: AC
  Administered 2015-12-05: 13:00:00 via INTRAVENOUS
  Filled 2015-12-05: qty 10

## 2015-12-05 NOTE — Progress Notes (Signed)
[name]   HEMATOLOGY/ONCOLOGY INPATIENT PROGRESS NOTE  Date of Service: 12/05/2015  Inpatient Attending: .Brunetta Genera, MD   SUBJECTIVE   Patient was seen around noon today.  He had some nausea  In the morning but this has completely resolved since he received Emend. He notes no acute concerns at this time. He expresses gratitude for all the help provided to him long treatment for his Burkitt's lymphoma.  We discussed if he would be open to doing an additional few doses of intrathecal methotrexate for CNS prophylaxis but he is inclined to not pursue this at this time.  OBJECTIVE:  NAD  PHYSICAL EXAMINATION: . Filed Vitals:   12/04/15 2110 12/05/15 0409 12/05/15 1450 12/05/15 2030  BP: 123/58 103/52 136/57 127/59  Pulse: 60 101 60 56  Temp: 98.3 F (36.8 C) 98.1 F (36.7 C) 98.5 F (36.9 C) 99.1 F (37.3 C)  TempSrc: Oral Oral Oral Oral  Resp: 18 14 16 14   Height:      Weight:  179 lb 11.2 oz (81.511 kg)    SpO2: 97% 94% 99% 99%   Filed Weights   12/03/15 1011 12/05/15 0409  Weight: 182 lb (82.555 kg) 179 lb 11.2 oz (81.511 kg)   .Body mass index is 24.72 kg/(m^2).  GENERAL:alert, in no acute distress and comfortable SKIN: skin color, texture, turgor are normal, no rashes or significant lesions EYES: normal, conjunctiva are pink and non-injected, sclera clear OROPHARYNX:no exudate, no erythema and lips, buccal mucosa, and tongue normal  NECK: supple, no JVD, thyroid normal size, non-tender, without nodularity LYMPH:  no palpable lymphadenopathy in the cervical, axillary or inguinal LUNGS: clear to auscultation with normal respiratory effort HEART: regular rate & rhythm,  no murmurs and no lower extremity edema ABDOMEN: abdomen soft, non-tender, normoactive bowel sounds  Musculoskeletal: no cyanosis of digits and no clubbing  PSYCH: alert & oriented x 3 with fluent speech NEURO: no focal motor/sensory deficits  MEDICAL HISTORY:  Past Medical History  Diagnosis  Date  . EBV infection 2013    Patient notes that he had an EBV infection in 2013 that left him with chronic fatigue. She also notes that he had significant lymphadenopathy and thyroiditis at the time. He has been managing his symptoms with an alternative medicine practitioner in [place] and with other alternative medicines.  . Marijuana use, episodic   . Complication of anesthesia   . Burkitt lymphoma ([place]) 08/15/2015    SURGICAL HISTORY: Past Surgical History  Procedure Laterality Date  . Tonsillectomy    . Appendectomy    . Laparoscopy N/A 07/20/2015    Procedure: LAPAROSCOPY DIAGNOSTIC, MULTIPLE BIOPSIES, DRAINAGE OF ASCITES;  Surgeon: Fanny Skates, MD;  Location: [facility];  Service: General;  Laterality: N/A;    SOCIAL HISTORY: Social History   Social History  . Marital Status: Married    Spouse Name: N/A  . Number of Children: N/A  . Years of Education: N/A   Occupational History  . Not on file.   Social History Main Topics  . Smoking status: Never Smoker   . Smokeless tobacco: Never Used  . Alcohol Use: No  . Drug Use: 1.00 per week    Special: Marijuana  . Sexual Activity: Yes   Other Topics Concern  . 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   He has a fiance and has been in a steady relationship.  He has tried to maintain a very healthy lifestyle and cycles about 100 miles a week. He also uses a fair number of over-the-counter alternative medicines to stay healthy.    FAMILY HISTORY: Family History  Problem Relation Age of Onset  . Heart failure Father     ALLERGIES:  is allergic to levaquin and sulfa antibiotics.  MEDICATIONS:  Scheduled Meds: . DOXOrubicin/vinCRIStine/etoposide CHEMO IV infusion for Inpatient CI   Intravenous Once  . fosaprepitant (EMEND) IV infusion 150 mg  150 mg Intravenous Once  . [START ON 12/06/2015] fosaprepitant (EMEND)  IV infusion 150 mg  150 mg Intravenous Once  . oxyCODONE  10 mg Oral Q12H  . pantoprazole  40 mg Oral Q breakfast  . polyethylene glycol  17 g Oral Daily  . predniSONE  100 mg Oral QAC breakfast  . senna-docusate  2 tablet Oral BID   Continuous Infusions: . sodium chloride 10 mL/hr at 12/03/15 1258   PRN Meds:.alteplase, alum & mag hydroxide-simeth, Cold Pack, heparin lock flush, heparin lock flush, Hot Pack, HYDROcodone-acetaminophen, sodium chloride, sodium chloride  REVIEW OF SYSTEMS:    10 Point review of Systems was done is negative except as noted above.   LABORATORY DATA:  I have reviewed the data as listed  . CBC Latest Ref Rng 12/05/2015 12/04/2015 12/03/2015  WBC 4.0 - 10.5 K/uL 6.1 7.4 5.0  Hemoglobin 13.0 - 17.0 g/dL 10.8(L) 10.6(L) 10.0(L)  Hematocrit 39.0 - 52.0 % 33.9(L) 32.9(L) 31.4(L)  Platelets 150 - 400 K/uL 278 273 263    . CMP Latest Ref Rng 12/05/2015 12/04/2015 12/03/2015  Glucose 65 - 99 mg/dL 128(H) 123(H) 94  BUN 6 - 20 mg/dL 18 14 15   Creatinine 0.61 - 1.24 mg/dL 0.66 0.58(L) 0.59(L)  Sodium 135 - 145 mmol/L 139 141 141  Potassium 3.5 - 5.1 mmol/L 3.9 4.1 4.1  Chloride 101 - 111 mmol/L 106 108 106  CO2 22 - 32 mmol/L 24 26 27   Calcium 8.9 - 10.3 mg/dL 8.7(L) 8.9 8.9  Total Protein 6.5 - 8.1 g/dL 5.8(L) 5.9(L) 5.8(L)  Total Bilirubin 0.3 - 1.2 mg/dL 0.3 0.2(L) 0.2(L)  Alkaline Phos 38 - 126 U/L 45 46 56  AST 15 - 41 U/L 20 19 22   ALT 17 - 63 U/L 21 19 21    . Lab Results  Component Value Date   LDH 158 12/03/2015   CSF cytology 12/04/2015 - no malignant cells identified  RADIOGRAPHIC STUDIES: I have personally reviewed the radiological images as listed and agreed with the findings in the report. X-ray Chest Pa And Lateral  11/05/2015  CLINICAL DATA:  Lymphoma.  Abdominal adenopathy EXAM: CHEST  2 VIEW COMPARISON:  10/23/2015 FINDINGS: RIGHT-sided power port with tip in distal SVC unchanged. Normal mediastinum and cardiac silhouette. No  effusion, infiltrate, or pneumothorax. IMPRESSION: No acute cardiopulmonary process. Electronically Signed   By: Suzy Bouchard M.D.   On: 11/05/2015 13:25   Dg Fluoro Guide Spinal/si Jt Inj Left  11/06/2015  Etheleen Mayhew, MD     11/06/2015 11:30 AM Lumbar puncture performed at L3-L4.  No CSF removed.  12 mg methotrexate injected intrathecally in total volume of 10 mL.  Needle removed.  No complications.  See full dictation in PACS.  Dg Fluoro Guided Loc Of Needle/cath Tip For Spinal Inject Rt  12/04/2015  CLINICAL DATA:  Burkitt's lymphoma EXAM: FLUOROSCOPICALLY GUIDED LUMBAR PUNCTURE FOR INTRATHECAL CHEMOTHERAPY FLUOROSCOPY TIME:  1 minutes 7 seconds PROCEDURE: Informed consent was obtained from the patient prior  to the procedure, including potential complications of headache, allergy, and pain. With the patient prone, the lower back was prepped with Betadine. 1% Lidocaine was used for local anesthesia. Lumbar puncture was performed at the L3-4 level using a gauge needle with return of clearCSF. 10 cc of methotrexate was injected into the subarachnoid space. The patient tolerated the procedure well without apparent complication. IMPRESSION: Technically successful lumbar puncture with removal of 9 cc of clear CSF and intrathecal administration of 10 cc of methotrexate. Electronically Signed   By: Kerby Moors M.D.   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 Cytogenetics showed Myc Break apart event associated with chromosomal variants of Burkitt's lymphoma t(2;8) and t(8;22). Patient is status post 5 cycles of EPOCH-R and received 4 dose of intrathecal methotrexate for CNS prophylaxis. His LDH level is down from 1200s to 300 to 188 to 155 PET/CT 08/22/2015 after 2 cycles of treatment. Complete metabolic response. No residual hypermetabolic lymphoma. patient has no clinical evidence of disease progression at this  time. His LDH level today is within normal limits. Plan -patient is doing well with no prohibitive toxicities.labs stable. -continuing on c6 D3 of EPOCH-R chemotherapy -Emend D3 and D5 -will be getting Rituxan on D5. -Neulasta on day 7. -continue to monitoring on chemotherapy. -we discussed the possibility of getting 2 more doses of intrathecal methotrexate for CNS prophylaxis.  The patient is thinking about this but overall seems disinclined to pursue this.   I spent 20 minutes counseling the patient face to face. The total time spent in the appointment was 25 minutes and more than 50% was on counseling and direct patient cares.    Sullivan Lone MD [place] AAHIVMS [facility] Hematology/Oncology Physician [facility]  (Office):       [phone] (Work cell):  [phone] (Fax):           [phone]

## 2015-12-05 NOTE — Progress Notes (Cosign Needed)
PT. RECEIVING DOSE 3/5 OF ADRIAMYCIN 10 MG/M2, ETOPOSIDE  50 MG/M2 AND VINCRISTINE 0.4 MG/M2. [name]  PATIENT AND DOSE VERIFIED BY Drue Dun, RN

## 2015-12-06 DIAGNOSIS — R110 Nausea: Secondary | ICD-10-CM

## 2015-12-06 DIAGNOSIS — R112 Nausea with vomiting, unspecified: Secondary | ICD-10-CM | POA: Insufficient documentation

## 2015-12-06 DIAGNOSIS — T451X5A Adverse effect of antineoplastic and immunosuppressive drugs, initial encounter: Secondary | ICD-10-CM

## 2015-12-06 LAB — COMPREHENSIVE METABOLIC PANEL
ALT: 32 U/L (ref 17–63)
AST: 27 U/L (ref 15–41)
Albumin: 3.4 g/dL — ABNORMAL LOW (ref 3.5–5.0)
Alkaline Phosphatase: 45 U/L (ref 38–126)
Anion gap: 7 (ref 5–15)
BUN: 19 mg/dL (ref 6–20)
CHLORIDE: 109 mmol/L (ref 101–111)
CO2: 27 mmol/L (ref 22–32)
CREATININE: 0.68 mg/dL (ref 0.61–1.24)
Calcium: 9 mg/dL (ref 8.9–10.3)
GFR calc Af Amer: >60 mL/min (ref >60)
Glucose, Bld: 144 mg/dL — ABNORMAL HIGH (ref 65–99)
Potassium: 4.4 mmol/L (ref 3.5–5.1)
Sodium: 143 mmol/L (ref 135–145)
Total Bilirubin: 0.5 mg/dL (ref 0.3–1.2)
Total Protein: 5.7 g/dL — ABNORMAL LOW (ref 6.5–8.1)

## 2015-12-06 LAB — CBC
HCT: 32.4 % — ABNORMAL LOW (ref 39.0–52.0)
Hemoglobin: 10.6 g/dL — ABNORMAL LOW (ref 13.0–17.0)
MCH: 30.3 pg (ref 26.0–34.0)
MCHC: 32.7 g/dL (ref 30.0–36.0)
MCV: 92.6 fL (ref 78.0–100.0)
PLATELETS: 239 10*3/uL (ref 150–400)
RBC: 3.5 MIL/uL — AB (ref 4.22–5.81)
RDW: 14.1 % (ref 11.5–15.5)
WBC: 4 10*3/uL (ref 4.0–10.5)

## 2015-12-06 MED ORDER — SODIUM CHLORIDE 0.9 % IV SOLN
150.0000 mg | Freq: Once | INTRAVENOUS | Status: AC
  Administered 2015-12-06: 150 mg via INTRAVENOUS
  Filled 2015-12-06: qty 5

## 2015-12-06 MED ORDER — SODIUM CHLORIDE 0.9 % IV SOLN
Freq: Once | INTRAVENOUS | Status: AC
  Administered 2015-12-06: 12:00:00 via INTRAVENOUS
  Filled 2015-12-06: qty 4

## 2015-12-06 MED ORDER — VINCRISTINE SULFATE CHEMO INJECTION 1 MG/ML
Freq: Once | INTRAVENOUS | Status: AC
  Administered 2015-12-06: 13:00:00 via INTRAVENOUS
  Filled 2015-12-06: qty 10

## 2015-12-06 NOTE — Progress Notes (Signed)
Patient had not taken hs meds yet so wasted them in sink with Nadean Corwin witnessing.

## 2015-12-06 NOTE — Progress Notes (Signed)
[name]   HEMATOLOGY/ONCOLOGY INPATIENT PROGRESS NOTE  Date of Service: 12/06/2015  Inpatient Attending: .Brunetta Genera, MD   SUBJECTIVE   Patient was seen this morning. Notes that he is feeling fairly nauseous.  Agreeable to getting additional dose of emend today. Discussed this with the nurse  and order placed.  He'll be starting his day 4  Bag of infusional chemotherapy and he'll also be getting his scheduled premeds (zofran) with this in the next  hour or so.  no other acute new concerns. No fevers or chills. No headaches. No acid reflux.  OBJECTIVE:  NAD  PHYSICAL EXAMINATION: . Filed Vitals:   12/05/15 0409 12/05/15 1450 12/05/15 2030 12/05/15 2216  BP: 103/52 136/57 127/59   Pulse: 101 60 56 58  Temp: 98.1 F (36.7 C) 98.5 F (36.9 C) 99.1 F (37.3 C)   TempSrc: Oral Oral Oral   Resp: 14 16 14    Height:      Weight: 179 lb 11.2 oz (81.511 kg)     SpO2: 94% 99% 99%    Filed Weights   12/03/15 1011 12/05/15 0409  Weight: 182 lb (82.555 kg) 179 lb 11.2 oz (81.511 kg)   .Body mass index is 24.72 kg/(m^2).  GENERAL:alert, in no acute distress and comfortable SKIN: skin color, texture, turgor are normal, no rashes or significant lesions EYES: normal, conjunctiva are pink and non-injected, sclera clear OROPHARYNX:no exudate, no erythema and lips, buccal mucosa, and tongue normal  NECK: supple, no JVD, thyroid normal size, non-tender, without nodularity LYMPH:  no palpable lymphadenopathy in the cervical, axillary or inguinal LUNGS: clear to auscultation with normal respiratory effort HEART: regular rate & rhythm,  no murmurs and no lower extremity edema ABDOMEN: abdomen soft, non-tender, normoactive bowel sounds  Musculoskeletal: no cyanosis of digits and no clubbing  PSYCH: alert & oriented x 3 with fluent speech NEURO: no focal motor/sensory deficits  MEDICAL HISTORY:  Past Medical History  Diagnosis Date  . EBV infection 2013    Patient notes that he had an  EBV infection in 2013 that left him with chronic fatigue. She also notes that he had significant lymphadenopathy and thyroiditis at the time. He has been managing his symptoms with an alternative medicine practitioner in [place] and with other alternative medicines.  . Marijuana use, episodic   . Complication of anesthesia   . Burkitt lymphoma ([place]) 08/15/2015    SURGICAL HISTORY: Past Surgical History  Procedure Laterality Date  . Tonsillectomy    . Appendectomy    . Laparoscopy N/A 07/20/2015    Procedure: LAPAROSCOPY DIAGNOSTIC, MULTIPLE BIOPSIES, DRAINAGE OF ASCITES;  Surgeon: Fanny Skates, MD;  Location: [facility];  Service: General;  Laterality: N/A;    SOCIAL HISTORY: Social History   Social History  . Marital Status: Married    Spouse Name: N/A  . Number of Children: N/A  . Years of Education: N/A   Occupational History  . Not on file.   Social History Main Topics  . Smoking status: Never Smoker   . Smokeless tobacco: Never Used  . Alcohol Use: No  . Drug Use: 1.00 per week    Special: Marijuana  . Sexual Activity: Yes   Other Topics Concern  . 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   He has a fiance and has been in a steady relationship.      He has tried to  maintain a very healthy lifestyle and cycles about 100 miles a week. He also uses a fair number of over-the-counter alternative medicines to stay healthy.    FAMILY HISTORY: Family History  Problem Relation Age of Onset  . Heart failure Father     ALLERGIES:  is allergic to levaquin and sulfa antibiotics.  MEDICATIONS:  Scheduled Meds: . DOXOrubicin/vinCRIStine/etoposide CHEMO IV infusion for Inpatient CI   Intravenous Once  . [START ON 12/07/2015] fosaprepitant (EMEND) IV infusion 150 mg  150 mg Intravenous Once  . fosaprepitant (EMEND) IV infusion 150 mg  150 mg Intravenous Once  . oxyCODONE  10  mg Oral Q12H  . pantoprazole  40 mg Oral Q breakfast  . polyethylene glycol  17 g Oral Daily  . predniSONE  100 mg Oral QAC breakfast  . senna-docusate  2 tablet Oral BID   Continuous Infusions: . sodium chloride 10 mL/hr at 12/05/15 2216   PRN Meds:.alteplase, alum & mag hydroxide-simeth, Cold Pack, heparin lock flush, heparin lock flush, Hot Pack, HYDROcodone-acetaminophen, sodium chloride, sodium chloride  REVIEW OF SYSTEMS:    10 Point review of Systems was done is negative except as noted above.   LABORATORY DATA:  I have reviewed the data as listed  . CBC Latest Ref Rng 12/06/2015 12/05/2015 12/04/2015  WBC 4.0 - 10.5 K/uL 4.0 6.1 7.4  Hemoglobin 13.0 - 17.0 g/dL 10.6(L) 10.8(L) 10.6(L)  Hematocrit 39.0 - 52.0 % 32.4(L) 33.9(L) 32.9(L)  Platelets 150 - 400 K/uL 239 278 273    . CMP Latest Ref Rng 12/06/2015 12/05/2015 12/04/2015  Glucose 65 - 99 mg/dL 144(H) 128(H) 123(H)  BUN 6 - 20 mg/dL 19 18 14   Creatinine 0.61 - 1.24 mg/dL 0.68 0.66 0.58(L)  Sodium 135 - 145 mmol/L 143 139 141  Potassium 3.5 - 5.1 mmol/L 4.4 3.9 4.1  Chloride 101 - 111 mmol/L 109 106 108  CO2 22 - 32 mmol/L 27 24 26   Calcium 8.9 - 10.3 mg/dL 9.0 8.7(L) 8.9  Total Protein 6.5 - 8.1 g/dL 5.7(L) 5.8(L) 5.9(L)  Total Bilirubin 0.3 - 1.2 mg/dL 0.5 0.3 0.2(L)  Alkaline Phos 38 - 126 U/L 45 45 46  AST 15 - 41 U/L 27 20 19   ALT 17 - 63 U/L 32 21 19   . Lab Results  Component Value Date   LDH 158 12/03/2015   CSF cytology 12/04/2015 - no malignant cells identified  RADIOGRAPHIC STUDIES: I have personally reviewed the radiological images as listed and agreed with the findings in the report. Dg Fluoro Guided Loc Of Needle/cath Tip For Spinal Inject Rt  12/04/2015  CLINICAL DATA:  Burkitt's lymphoma EXAM: FLUOROSCOPICALLY GUIDED LUMBAR PUNCTURE FOR INTRATHECAL CHEMOTHERAPY FLUOROSCOPY TIME:  1 minutes 7 seconds PROCEDURE: Informed consent was obtained from the patient prior to the procedure, including  potential complications of headache, allergy, and pain. With the patient prone, the lower back was prepped with Betadine. 1% Lidocaine was used for local anesthesia. Lumbar puncture was performed at the L3-4 level using a gauge needle with return of clearCSF. 10 cc of methotrexate was injected into the subarachnoid space. The patient tolerated the procedure well without apparent complication. IMPRESSION: Technically successful lumbar puncture with removal of 9 cc of clear CSF and intrathecal administration of 10 cc of methotrexate. Electronically Signed   By: Kerby Moors M.D.   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 Cytogenetics showed Myc Break apart  event associated with chromosomal variants of Burkitt's lymphoma t(2;8) and t(8;22). Patient is status post 5 cycles of EPOCH-R and received 4 dose of intrathecal methotrexate for CNS prophylaxis. His LDH level is down from 1200s to 300 to 188 to 155 PET/CT 08/22/2015 after 2 cycles of treatment. Complete metabolic response. No residual hypermetabolic lymphoma. patient has no clinical evidence of disease progression at this time. His LDH level today is within normal limits. 2) Grade 2 Nausea Plan -patient is doing well with no prohibitive toxicities.labs stable. -continuing on c6 D4 of EPOCH-R chemotherapy -Emend today and tommorrow -will be getting Rituxan on D5. -Neulasta on day 7 as outpatient. -continue to monitoring on chemotherapy. -we discussed the possibility of getting 2 more doses of intrathecal methotrexate for CNS prophylaxis will  Follow-up with the patient prior to discharge to make a final decision about this.  DISPO:  We'll plan to discharge the patient likely later tomorrow after completion of treatment.  I spent 20 minutes counseling the patient face to face. The total time spent in the appointment was 25 minutes and more than 50% was on counseling and  direct patient cares.    Sullivan Lone MD [place] AAHIVMS [facility] Hematology/Oncology Physician [facility]  (Office):       [phone] (Work cell):  [phone] (Fax):           [phone]

## 2015-12-06 NOTE — Progress Notes (Signed)
Chemo calculations for adriamycin, Etoposide and Oncovin done manually with Kirkland Hun RN.

## 2015-12-07 ENCOUNTER — Telehealth: Payer: Self-pay | Admitting: Hematology

## 2015-12-07 ENCOUNTER — Other Ambulatory Visit: Payer: Self-pay | Admitting: Hematology

## 2015-12-07 DIAGNOSIS — R112 Nausea with vomiting, unspecified: Secondary | ICD-10-CM

## 2015-12-07 DIAGNOSIS — C8378 Burkitt lymphoma, lymph nodes of multiple sites: Secondary | ICD-10-CM

## 2015-12-07 LAB — COMPREHENSIVE METABOLIC PANEL
ALK PHOS: 38 U/L (ref 38–126)
ALT: 37 U/L (ref 17–63)
ANION GAP: 9 (ref 5–15)
AST: 24 U/L (ref 15–41)
Albumin: 3.4 g/dL — ABNORMAL LOW (ref 3.5–5.0)
BUN: 18 mg/dL (ref 6–20)
CALCIUM: 8.5 mg/dL — AB (ref 8.9–10.3)
CO2: 27 mmol/L (ref 22–32)
CREATININE: 0.62 mg/dL (ref 0.61–1.24)
Chloride: 103 mmol/L (ref 101–111)
Glucose, Bld: 136 mg/dL — ABNORMAL HIGH (ref 65–99)
Potassium: 4.2 mmol/L (ref 3.5–5.1)
Sodium: 139 mmol/L (ref 135–145)
Total Bilirubin: 1 mg/dL (ref 0.3–1.2)
Total Protein: 5.6 g/dL — ABNORMAL LOW (ref 6.5–8.1)

## 2015-12-07 LAB — CBC
HCT: 32.4 % — ABNORMAL LOW (ref 39.0–52.0)
Hemoglobin: 10.9 g/dL — ABNORMAL LOW (ref 13.0–17.0)
MCH: 30.4 pg (ref 26.0–34.0)
MCHC: 33.6 g/dL (ref 30.0–36.0)
MCV: 90.3 fL (ref 78.0–100.0)
PLATELETS: 216 10*3/uL (ref 150–400)
RBC: 3.59 MIL/uL — AB (ref 4.22–5.81)
RDW: 13.4 % (ref 11.5–15.5)
WBC: 3 10*3/uL — ABNORMAL LOW (ref 4.0–10.5)

## 2015-12-07 MED ORDER — SODIUM CHLORIDE 0.9 % IV SOLN
Freq: Once | INTRAVENOUS | Status: AC
  Administered 2015-12-07: 14:00:00 via INTRAVENOUS

## 2015-12-07 MED ORDER — DIPHENHYDRAMINE HCL 50 MG PO CAPS
50.0000 mg | ORAL_CAPSULE | Freq: Once | ORAL | Status: AC
  Administered 2015-12-07: 50 mg via ORAL
  Filled 2015-12-07: qty 1

## 2015-12-07 MED ORDER — EPINEPHRINE HCL 0.1 MG/ML IJ SOSY: 0.2500 mg | PREFILLED_SYRINGE | Freq: Once | INTRAMUSCULAR | Status: DC | PRN

## 2015-12-07 MED ORDER — ALTEPLASE 2 MG IJ SOLR: 2.0000 mg | Freq: Once | INTRAMUSCULAR | Status: DC | PRN

## 2015-12-07 MED ORDER — FAMOTIDINE IN NACL 20-0.9 MG/50ML-% IV SOLN: 20.0000 mg | Freq: Once | INTRAVENOUS | Status: DC | PRN

## 2015-12-07 MED ORDER — HEPARIN SOD (PORK) LOCK FLUSH 100 UNIT/ML IV SOLN: 250.0000 [IU] | Freq: Once | INTRAVENOUS | Status: DC | PRN

## 2015-12-07 MED ORDER — EPINEPHRINE HCL 1 MG/ML IJ SOLN: 0.5000 mg | Freq: Once | INTRAMUSCULAR | Status: DC | PRN

## 2015-12-07 MED ORDER — SODIUM CHLORIDE 0.9 % IV SOLN: Freq: Once | INTRAVENOUS | Status: DC | PRN

## 2015-12-07 MED ORDER — CYCLOPHOSPHAMIDE CHEMO INJECTION 1 GM
750.0000 mg/m2 | Freq: Once | INTRAMUSCULAR | Status: AC
  Administered 2015-12-07: 1600 mg via INTRAVENOUS
  Filled 2015-12-07: qty 80

## 2015-12-07 MED ORDER — SODIUM CHLORIDE 0.9 % IJ SOLN
3.0000 mL | INTRAMUSCULAR | Status: DC | PRN
Start: 2015-12-07 — End: 2015-12-07

## 2015-12-07 MED ORDER — SODIUM CHLORIDE 0.9 % IJ SOLN: 10.0000 mL | INTRAMUSCULAR | Status: DC | PRN

## 2015-12-07 MED ORDER — ALBUTEROL SULFATE (2.5 MG/3ML) 0.083% IN NEBU: 2.5000 mg | INHALATION_SOLUTION | Freq: Once | RESPIRATORY_TRACT | Status: DC | PRN

## 2015-12-07 MED ORDER — METHYLPREDNISOLONE SODIUM SUCC 125 MG IJ SOLR: 125.0000 mg | Freq: Once | INTRAMUSCULAR | Status: DC | PRN

## 2015-12-07 MED ORDER — ACETAMINOPHEN 325 MG PO TABS
650.0000 mg | ORAL_TABLET | Freq: Once | ORAL | Status: AC
  Administered 2015-12-07: 650 mg via ORAL
  Filled 2015-12-07: qty 2

## 2015-12-07 MED ORDER — DIPHENHYDRAMINE HCL 50 MG/ML IJ SOLN: 25.0000 mg | Freq: Once | INTRAMUSCULAR | Status: DC | PRN

## 2015-12-07 MED ORDER — RITUXIMAB CHEMO INJECTION 500 MG/50ML
375.0000 mg/m2 | Freq: Once | INTRAVENOUS | Status: AC
  Administered 2015-12-07: 800 mg via INTRAVENOUS
  Filled 2015-12-07: qty 50

## 2015-12-07 MED ORDER — DIPHENHYDRAMINE HCL 50 MG/ML IJ SOLN: 50.0000 mg | Freq: Once | INTRAMUSCULAR | Status: DC | PRN

## 2015-12-07 MED ORDER — SODIUM CHLORIDE 0.9 % IV SOLN
Freq: Once | INTRAVENOUS | Status: AC
  Administered 2015-12-07: 12:00:00 via INTRAVENOUS
  Filled 2015-12-07: qty 8

## 2015-12-07 MED ORDER — HEPARIN SOD (PORK) LOCK FLUSH 100 UNIT/ML IV SOLN: 500.0000 [IU] | Freq: Once | INTRAVENOUS | Status: DC | PRN

## 2015-12-07 NOTE — Discharge Summary (Signed)
[name]  [phone] [phone]    Patient ID: Donald [name] MRN: [id number] DOB/AGE: 04-25-1976 40 y.o.  Admit date: 12/03/2015 Discharge date: 12/07/2015  Primary Care Physician:  Drema Pry, DO  Discharge Diagnoses:  Burkitts Lymphoma   Present on Admission:  . Burkitt's lymphoma ([place])  Discharge Medications:    Medication List    STOP taking these medications        nystatin 100000 UNIT/ML suspension  Commonly known as:  MYCOSTATIN      TAKE these medications        HYDROcodone-acetaminophen 7.5-325 MG tablet  Commonly known as:  NORCO  Take 1-2 tablets by mouth every 6 (six) hours as needed for severe pain.     lansoprazole 30 MG capsule  Commonly known as:  PREVACID  Take 1 capsule (30 mg total) by mouth daily at 12 noon.     metoCLOPramide 10 MG tablet  Commonly known as:  REGLAN  Take 1 tablet (10 mg total) by mouth every 6 (six) hours as needed for nausea.     ondansetron 8 MG tablet  Commonly known as:  ZOFRAN  Take 1 tablet (8 mg total) by mouth every 8 (eight) hours as needed for nausea or vomiting.     oxyCODONE 10 mg 12 hr tablet  Commonly known as:  OXYCONTIN  Take 1 tablet (10 mg total) by mouth every 12 (twelve) hours.     polyethylene glycol packet  Commonly known as:  MIRALAX / GLYCOLAX  Take 17 g by mouth daily.     PRESCRIPTION MEDICATION  Patient receives his chemo treatments at the [facility] with Dr. Irene Limbo. He received Neulasta 6mg  on 08/21/15. He is on a 21 day cycle of EPOCH-R that he receives during hospitalization.     senna-docusate 8.6-50 MG tablet  Commonly known as:  Senokot-S  Take 2 tablets by mouth 2 (two) times daily.         Disposition and Follow-up:   Significant Diagnostic Studies:  Dg Fluoro Guided Loc Of Needle/cath Tip For Spinal Inject Rt  12/04/2015  CLINICAL DATA:  Burkitt's lymphoma EXAM: FLUOROSCOPICALLY GUIDED LUMBAR  PUNCTURE FOR INTRATHECAL CHEMOTHERAPY FLUOROSCOPY TIME:  1 minutes 7 seconds PROCEDURE: Informed consent was obtained from the patient prior to the procedure, including potential complications of headache, allergy, and pain. With the patient prone, the lower back was prepped with Betadine. 1% Lidocaine was used for local anesthesia. Lumbar puncture was performed at the L3-4 level using a gauge needle with return of clearCSF. 10 cc of methotrexate was injected into the subarachnoid space. The patient tolerated the procedure well without apparent complication. IMPRESSION: Technically successful lumbar puncture with removal of 9 cc of clear CSF and intrathecal administration of 10 cc of methotrexate. Electronically Signed   By: Kerby Moors M.D.   On: 12/04/2015 13:51    Discharge Laboratory Values:  . CBC Latest Ref Rng 12/07/2015 12/06/2015 12/05/2015  WBC 4.0 - 10.5 K/uL 3.0(L) 4.0 6.1  Hemoglobin 13.0 - 17.0 g/dL 10.9(L) 10.6(L) 10.8(L)  Hematocrit 39.0 - 52.0 % 32.4(L) 32.4(L) 33.9(L)  Platelets 150 - 400 K/uL 216 239 278   . CMP Latest Ref Rng 12/07/2015 12/06/2015 12/05/2015  Glucose 65 - 99 mg/dL 136(H) 144(H) 128(H)  BUN 6 - 20 mg/dL 18 19 18   Creatinine 0.61 - 1.24 mg/dL 0.62 0.68 0.66  Sodium 135 - 145 mmol/L 139 143 139  Potassium 3.5 - 5.1  mmol/L 4.2 4.4 3.9  Chloride 101 - 111 mmol/L 103 109 106  CO2 22 - 32 mmol/L 27 27 24   Calcium 8.9 - 10.3 mg/dL 8.5(L) 9.0 8.7(L)  Total Protein 6.5 - 8.1 g/dL 5.6(L) 5.7(L) 5.8(L)  Total Bilirubin 0.3 - 1.2 mg/dL 1.0 0.5 0.3  Alkaline Phos 38 - 126 U/L 38 45 45  AST 15 - 41 U/L 24 27 20   ALT 17 - 63 U/L 37 32 21    Brief H and P: For complete details please refer to admission H and P, but in brief,   Donald [name] is a pleasant 40 year old Caucasian gentleman with a diagnosis of high-risk Burkitt's lymphoma with involvement of multiple regions including abdomen and chest with no known CNS involvement. Patient has been in remission based  on his PET CT scan after 2 cycles of EPOCH-R chemotherapy. He continued to have normal LDH levels and no clinical signs of disease progression. No clinical evidence of CNS involvement. Patient was electively admitted for his sixth and final cycle of EPOCH-R on 12/03/2015 and also his fourth dose of intrathecal methotrexate for CNS prophylaxis. Patient tolerated this treatment well with some grade 1 nausea that required additional use of Emend in addition to his standard Zofran/dexamethasone premedications. Patient is glad to have completed his treatment. No other acute issues this hospitalization. Labs have remained stable. Mild anemia due to chemotherapy. Patient will follow-up in the cancer center for day 7 Neulasta on 12/10/2015. We discussed the option of doing 2 additional every 2 weekly intrathecal methotrexate doses for CNS prophylaxis but patient after significant discussion politely declines to pursue this. We will see him in clinic on 12/17/2015 with repeat labs to monitor postchemotherapy labs. He will require a post treatment PET CT scan in 4-5 weeks.  Physical Exam at Discharge: BP 132/66 mmHg  Pulse 54  Temp(Src) 97.8 F (36.6 C) (Oral)  Resp 20  Ht 5' 11.5" (1.816 m)  Wt 179 lb 11.2 oz (81.511 kg)  BMI 24.72 kg/m2  SpO2 100%  GENERAL:alert, in no acute distress and comfortable SKIN: skin color, texture, turgor are normal, no rashes or significant lesions EYES: normal, conjunctiva are pink and non-injected, sclera clear OROPHARYNX:no exudate, no erythema and lips, buccal mucosa, and tongue normal  NECK: supple, no JVD, thyroid normal size, non-tender, without nodularity LYMPH: no palpable lymphadenopathy in the cervical, axillary or inguinal LUNGS: clear to auscultation with normal respiratory effort HEART: regular rate & rhythm, no murmurs and no lower extremity edema ABDOMEN: abdomen soft, non-tender, normoactive bowel sounds  Musculoskeletal: no cyanosis of digits and no  clubbing  PSYCH: alert & oriented x 3 with fluent speech NEURO: no focal motor/sensory deficits   Hospital Course:  Active Problems:   Burkitt's lymphoma ([place])   Burkitt's lymphoma of lymph nodes of multiple regions ([place])   Burkitt lymphoma of intra-abdominal lymph nodes ([facility])   Chemotherapy induced nausea and vomiting   Diet:  Regular diet  Activity:  Regular activity. Counseled not to overexert himself. Avoid crowds and sick contacts due to high risk of infection.  Condition at Discharge:   Stable  Signed: Dr. Sullivan Lone MD [place] [phone]  12/07/2015, 2:31 PM

## 2015-12-07 NOTE — Progress Notes (Signed)
MEDICATION RELATED CONSULT NOTE    Patient Measurements: Height: 5' 11.5" (181.6 cm) Weight: 179 lb 11.2 oz (81.511 kg) IBW/kg (Calculated) : 76.45  Medical History: Past Medical History  Diagnosis Date  . EBV infection 2013    Patient notes that he had an EBV infection in 2013 that left him with chronic fatigue. She also notes that he had significant lymphadenopathy and thyroiditis at the time. He has been managing his symptoms with an alternative medicine practitioner in [place] and with other alternative medicines.  . Marijuana use, episodic   . Complication of anesthesia   . Burkitt lymphoma ([place]) 08/15/2015    Assessment: Current weight, 81.5 kg is 10.1% decreased from initial treatment plan weight of 90.7 kg Current BSA 2.03 is 4.7% decreased from initial treatment plan BSA of 2.13.  Plan: Discussed weight changes with Dr. Irene Limbo, who recommended no dosage changes for cycle 6 (out of 6 planned cycles) of chemotherapy.   Gretta Arab PharmD, BCPS Pager [phone] 12/07/2015 10:28 AM

## 2015-12-07 NOTE — Progress Notes (Signed)
Patient tolerated Cytoxan and Rituxan infusions without complications.

## 2015-12-07 NOTE — Progress Notes (Signed)
Patient refused PM medications.

## 2015-12-07 NOTE — Telephone Encounter (Signed)
per pof to sch pt appt-cld & spoke to pt and gave pt time & date of appt on 1/7

## 2015-12-07 NOTE — Progress Notes (Signed)
Nursing Discharge Summary  Patient ID: Donald [name] MRN: [id number] DOB/AGE: 42-Apr-1977 42 y.o.  Admit date: 12/03/2015 Discharge date: 12/07/2015  Discharged Condition: good  Disposition: 01-Home or Self Care    Prescriptions Given: No prescriptions given. Follow up appointments discussed and patient and significant other verbalized understanding without further questions.  Means of Discharge: Patient to be taken downstairs via wheelchair to be discharge home via private vehicle.   Signed: Buel Ream 12/07/2015, 4:58 PM

## 2015-12-07 NOTE — Plan of Care (Signed)
Problem: Bowel/Gastric: Goal: Occurrences of nausea will decrease Outcome: Progressing Patient reports feeling "bad" and nauseated. Refused need for an antiemetic. Given Ice chips. Will continue to monitor.

## 2015-12-09 ENCOUNTER — Emergency Department (HOSPITAL_COMMUNITY)
Admission: EM | Admit: 2015-12-09 | Discharge: 2015-12-09 | Disposition: A | Discharge status: Home / Self Care | Payer: BLUE CROSS/BLUE SHIELD | Attending: Emergency Medicine | Admitting: Emergency Medicine

## 2015-12-09 ENCOUNTER — Encounter (HOSPITAL_COMMUNITY): Payer: Self-pay

## 2015-12-09 DIAGNOSIS — Z79899 Other long term (current) drug therapy: Secondary | ICD-10-CM | POA: Insufficient documentation

## 2015-12-09 DIAGNOSIS — R112 Nausea with vomiting, unspecified: Secondary | ICD-10-CM | POA: Diagnosis present

## 2015-12-09 DIAGNOSIS — Z8572 Personal history of non-Hodgkin lymphomas: Secondary | ICD-10-CM | POA: Diagnosis not present

## 2015-12-09 DIAGNOSIS — Z8619 Personal history of other infectious and parasitic diseases: Secondary | ICD-10-CM | POA: Insufficient documentation

## 2015-12-09 DIAGNOSIS — T451X5A Adverse effect of antineoplastic and immunosuppressive drugs, initial encounter: Secondary | ICD-10-CM | POA: Insufficient documentation

## 2015-12-09 LAB — CBC WITH DIFFERENTIAL/PLATELET
Basophils Absolute: 0 10*3/uL (ref 0.0–0.1)
Basophils Relative: 0 %
Eosinophils Absolute: 0 10*3/uL (ref 0.0–0.7)
Eosinophils Relative: 0 %
HEMATOCRIT: 34.7 % — AB (ref 39.0–52.0)
HEMOGLOBIN: 11.4 g/dL — AB (ref 13.0–17.0)
LYMPHS ABS: 1.1 10*3/uL (ref 0.7–4.0)
Lymphocytes Relative: 30 %
MCH: 29.3 pg (ref 26.0–34.0)
MCHC: 32.9 g/dL (ref 30.0–36.0)
MCV: 89.2 fL (ref 78.0–100.0)
MONOS PCT: 2 %
Monocytes Absolute: 0.1 10*3/uL (ref 0.1–1.0)
NEUTROS ABS: 2.5 10*3/uL (ref 1.7–7.7)
NEUTROS PCT: 68 %
Platelets: 234 10*3/uL (ref 150–400)
RBC: 3.89 MIL/uL — ABNORMAL LOW (ref 4.22–5.81)
RDW: 13.4 % (ref 11.5–15.5)
WBC: 3.6 10*3/uL — ABNORMAL LOW (ref 4.0–10.5)

## 2015-12-09 LAB — COMPREHENSIVE METABOLIC PANEL
ALT: 33 U/L (ref 17–63)
ANION GAP: 8 (ref 5–15)
AST: 19 U/L (ref 15–41)
Albumin: 3.6 g/dL (ref 3.5–5.0)
Alkaline Phosphatase: 39 U/L (ref 38–126)
BUN: 28 mg/dL — ABNORMAL HIGH (ref 6–20)
CHLORIDE: 106 mmol/L (ref 101–111)
CO2: 27 mmol/L (ref 22–32)
Calcium: 8.7 mg/dL — ABNORMAL LOW (ref 8.9–10.3)
Creatinine, Ser: 0.65 mg/dL (ref 0.61–1.24)
GFR calc non Af Amer: >60 mL/min (ref >60)
Glucose, Bld: 90 mg/dL (ref 65–99)
Potassium: 3.2 mmol/L — ABNORMAL LOW (ref 3.5–5.1)
SODIUM: 141 mmol/L (ref 135–145)
Total Bilirubin: 1 mg/dL (ref 0.3–1.2)
Total Protein: 6 g/dL — ABNORMAL LOW (ref 6.5–8.1)

## 2015-12-09 MED ORDER — HYDROMORPHONE HCL 1 MG/ML IJ SOLN
0.5000 mg | INTRAMUSCULAR | Status: DC | PRN
Start: 2015-12-09 — End: 2015-12-09
  Administered 2015-12-09: 0.5 mg via INTRAVENOUS
  Filled 2015-12-09: qty 1

## 2015-12-09 MED ORDER — SODIUM CHLORIDE 0.9 % IV BOLUS (SEPSIS)
1000.0000 mL | Freq: Once | INTRAVENOUS | Status: AC
  Administered 2015-12-09: 1000 mL via INTRAVENOUS

## 2015-12-09 MED ORDER — ONDANSETRON HCL 4 MG/2ML IJ SOLN
4.0000 mg | Freq: Once | INTRAMUSCULAR | Status: AC
  Administered 2015-12-09: 4 mg via INTRAVENOUS
  Filled 2015-12-09: qty 2

## 2015-12-09 MED ORDER — HEPARIN SOD (PORK) LOCK FLUSH 100 UNIT/ML IV SOLN
500.0000 [IU] | Freq: Once | INTRAVENOUS | Status: AC
  Administered 2015-12-09: 500 [IU]
  Filled 2015-12-09: qty 5

## 2015-12-09 MED ORDER — SODIUM CHLORIDE 0.9 % IV SOLN: INTRAVENOUS | Status: DC

## 2015-12-09 MED ORDER — ONDANSETRON 8 MG PO TBDP: 8.0000 mg | ORAL_TABLET | Freq: Three times a day (TID) | ORAL | Status: DC | PRN

## 2015-12-09 NOTE — ED Notes (Signed)
He capably ambulates to b.r. At this time.  He c/o some persistent nausea and tells [place] his h/a is gone.  Dr. Tomi Bamberger sees him at this time and orders additional IV med.

## 2015-12-09 NOTE — ED Provider Notes (Signed)
CSN: [id number]     Arrival date & time 12/09/15  0428 History   First MD Initiated Contact with Patient 12/09/15 0444     Chief Complaint  Patient presents with  . Emesis   HPI Patient presents to emergency room with complaints of nausea and vomiting and a burning discomfort in his head. Patient has a history of lymphoma. He was just recently discharged from the hospital after receiving chemotherapy which did include intrathecal methotrexate. Patient was electively admitted for his sixth and final cycle of EPOCH-R on 12/03/2015 and also his fourth dose of intrathecal methotrexate for CNS prophylaxis. Patient left on Friday and pretty soon after that he started having trouble with nausea and vomiting. The patient has had numerous episodes. He has not been able to keep down any fluids. He feels like he is getting dehydrated. Some Phenergan to take at home but was not able to keep that down.  He denies any fevers or chills. No chest pain or shortness of breath. No rashes. Past Medical History  Diagnosis Date  . EBV infection 2013    Patient notes that he had an EBV infection in 2013 that left him with chronic fatigue. She also notes that he had significant lymphadenopathy and thyroiditis at the time. He has been managing his symptoms with an alternative medicine practitioner in [place] and with other alternative medicines.  . Marijuana use, episodic   . Complication of anesthesia   . Burkitt lymphoma ([place]) 08/15/2015   Past Surgical History  Procedure Laterality Date  . Tonsillectomy    . Appendectomy    . Laparoscopy N/A 07/20/2015    Procedure: LAPAROSCOPY DIAGNOSTIC, MULTIPLE BIOPSIES, DRAINAGE OF ASCITES;  Surgeon: Fanny Skates, MD;  Location: [facility];  Service: General;  Laterality: N/A;   Family History  Problem Relation Age of Onset  . Heart failure Father    Social History  Substance Use Topics  . Smoking status: Never Smoker   . Smokeless tobacco: Never Used  . Alcohol Use:  No    Review of Systems  All other systems reviewed and are negative.     Allergies  Levaquin and Sulfa antibiotics  Home Medications   Prior to Admission medications   Medication Sig Start Date End Date Taking? Authorizing Provider  HYDROcodone-acetaminophen (NORCO) 7.5-325 MG tablet Take 1-2 tablets by mouth every 6 (six) hours as needed for severe pain. 11/09/15   Volanda Napoleon, MD  lansoprazole (PREVACID) 30 MG capsule Take 1 capsule (30 mg total) by mouth daily at 12 noon. 10/11/15   Brunetta Genera, MD  metoCLOPramide (REGLAN) 10 MG tablet Take 1 tablet (10 mg total) by mouth every 6 (six) hours as needed for nausea. 10/14/15   Harvel Quale, MD  ondansetron (ZOFRAN) 8 MG tablet Take 1 tablet (8 mg total) by mouth every 8 (eight) hours as needed for nausea or vomiting. 08/03/15   Brunetta Genera, MD  oxyCODONE (OXYCONTIN) 10 mg 12 hr tablet Take 1 tablet (10 mg total) by mouth every 12 (twelve) hours. 11/11/15   Volanda Napoleon, MD  polyethylene glycol [facility] Floria Raveling) packet Take 17 g by mouth daily. Patient taking differently: Take 17 g by mouth daily as needed for mild constipation.  08/19/15   Brunetta Genera, MD  PRESCRIPTION MEDICATION Patient receives his chemo treatments at the [facility] with Dr. Irene Limbo. He received Neulasta 6mg  on 08/21/15. He is on a 21 day cycle of EPOCH-R  that he receives during hospitalization.    Historical Provider, MD  senna-docusate (SENOKOT-S) 8.6-50 MG tablet Take 2 tablets by mouth 2 (two) times daily. Patient taking differently: Take 2 tablets by mouth 2 (two) times daily as needed for mild constipation.  09/21/15   Brunetta Genera, MD   BP 118/69 mmHg  Pulse 60  Temp(Src) 98.2 F (36.8 C) (Oral)  Resp 16  SpO2 99% Physical Exam  Constitutional: He appears well-developed and well-nourished.  HENT:  Head: Normocephalic and atraumatic.  Right Ear: External ear normal.  Left Ear: External  ear normal.  Eyes: Conjunctivae are normal. Right eye exhibits no discharge. Left eye exhibits no discharge. No scleral icterus.  Neck: Neck supple. No tracheal deviation present.  Cardiovascular: Normal rate, regular rhythm and intact distal pulses.   Pulmonary/Chest: Effort normal and breath sounds normal. No stridor. No respiratory distress. He has no wheezes. He has no rales.  Abdominal: Soft. Bowel sounds are normal. He exhibits no distension. There is no tenderness. There is no rebound and no guarding.  Musculoskeletal: He exhibits no edema or tenderness.  Neurological: He is alert. He has normal strength. No cranial nerve deficit (no facial droop, extraocular movements intact, no slurred speech) or sensory deficit. He exhibits normal muscle tone. He displays no seizure activity. Coordination normal.  Skin: Skin is warm and dry. No rash noted.  Psychiatric: He has a normal mood and affect.  Nursing note and vitals reviewed.   ED Course  Procedures (including critical care time) Labs Review Labs Reviewed  COMPREHENSIVE METABOLIC PANEL - Abnormal; Notable for the following:    Potassium 3.2 (*)    BUN 28 (*)    Calcium 8.7 (*)    Total Protein 6.0 (*)    All other components within normal limits  CBC WITH DIFFERENTIAL/PLATELET - Abnormal; Notable for the following:    WBC 3.6 (*)    RBC 3.89 (*)    Hemoglobin 11.4 (*)    HCT 34.7 (*)    All other components within normal limits   Medications  HYDROmorphone (DILAUDID) injection 0.5 mg (0.5 mg Intravenous Given 12/09/15 0522)  sodium chloride 0.9 % bolus 1,000 mL (1,000 mLs Intravenous New Bag/Given 12/09/15 0523)    And  sodium chloride 0.9 % bolus 1,000 mL (0 mLs Intravenous Stopped 12/09/15 0739)    And  0.9 %  sodium chloride infusion (not administered)  ondansetron (ZOFRAN) injection 4 mg (not administered)  ondansetron (ZOFRAN) injection 4 mg (4 mg Intravenous Given 12/09/15 0522)     MDM   Final diagnoses:   Chemotherapy induced nausea and vomiting    The patient's symptoms have improved with IV fluids and antinausea medications. After the initial dose he still had some nausea. I'll give him additional dose and we'll try an oral fluid challenge.    Dorie Rank, MD 12/09/15 [phone]

## 2015-12-09 NOTE — ED Provider Notes (Signed)
Patient reports feeling improved after IV fluid hydration. He states he has been able to start taking fluids orally and does not think he is going to vomit again. Discussed the option of admission for hydration if the patient did not feel improved, at this time he wishes to go home. Dr. Tomi Bamberger has provided prescriptions for Zofran for the patient and return precautions. I have advised the patient if he does not feel well or is having difficulty tolerating fluids he is to return to the emergency department.  Charlesetta Shanks, MD 12/09/15 [phone]

## 2015-12-09 NOTE — Discharge Instructions (Signed)

## 2015-12-09 NOTE — ED Notes (Signed)
Pt just released from hospital on Friday after 5 days of chemo and has been vomiting since then.

## 2015-12-09 NOTE — ED Notes (Signed)
He has eaten some crackers and drank some pop without difficulty.  He states he feels "much better".

## 2015-12-10 ENCOUNTER — Ambulatory Visit (HOSPITAL_BASED_OUTPATIENT_CLINIC_OR_DEPARTMENT_OTHER): Payer: BLUE CROSS/BLUE SHIELD

## 2015-12-10 VITALS — BP 110/56 | HR 61 | Temp 98.1°F

## 2015-12-10 DIAGNOSIS — C8379 Burkitt lymphoma, extranodal and solid organ sites: Secondary | ICD-10-CM | POA: Diagnosis not present

## 2015-12-10 DIAGNOSIS — Z5189 Encounter for other specified aftercare: Secondary | ICD-10-CM | POA: Diagnosis not present

## 2015-12-10 DIAGNOSIS — Z5111 Encounter for antineoplastic chemotherapy: Secondary | ICD-10-CM

## 2015-12-10 DIAGNOSIS — C8378 Burkitt lymphoma, lymph nodes of multiple sites: Secondary | ICD-10-CM

## 2015-12-10 MED ORDER — PEGFILGRASTIM INJECTION 6 MG/0.6ML
6.0000 mg | Freq: Once | SUBCUTANEOUS | Status: AC
  Administered 2015-12-10: 6 mg via SUBCUTANEOUS
  Filled 2015-12-10: qty 0.6

## 2015-12-17 ENCOUNTER — Encounter: Payer: Self-pay | Admitting: Hematology

## 2015-12-17 ENCOUNTER — Ambulatory Visit (HOSPITAL_BASED_OUTPATIENT_CLINIC_OR_DEPARTMENT_OTHER): Payer: BLUE CROSS/BLUE SHIELD | Admitting: Hematology

## 2015-12-17 ENCOUNTER — Telehealth: Payer: Self-pay | Admitting: Hematology

## 2015-12-17 ENCOUNTER — Ambulatory Visit: Payer: BLUE CROSS/BLUE SHIELD

## 2015-12-17 ENCOUNTER — Other Ambulatory Visit (HOSPITAL_BASED_OUTPATIENT_CLINIC_OR_DEPARTMENT_OTHER): Payer: BLUE CROSS/BLUE SHIELD

## 2015-12-17 VITALS — BP 113/59 | HR 75 | Temp 98.8°F | Resp 18 | Ht 71.0 in | Wt 184.3 lb

## 2015-12-17 DIAGNOSIS — C8379 Burkitt lymphoma, extranodal and solid organ sites: Secondary | ICD-10-CM

## 2015-12-17 DIAGNOSIS — C8370 Burkitt lymphoma, unspecified site: Secondary | ICD-10-CM

## 2015-12-17 DIAGNOSIS — C8378 Burkitt lymphoma, lymph nodes of multiple sites: Secondary | ICD-10-CM

## 2015-12-17 LAB — CBC & DIFF AND RETIC
BASO%: 0.4 % (ref 0.0–2.0)
BASOS ABS: 0.1 10*3/uL (ref 0.0–0.1)
EOS%: 0.4 % (ref 0.0–7.0)
Eosinophils Absolute: 0.1 10*3/uL (ref 0.0–0.5)
HCT: 32.1 % — ABNORMAL LOW (ref 38.4–49.9)
HGB: 10.2 g/dL — ABNORMAL LOW (ref 13.0–17.1)
IMMATURE RETIC FRACT: 29.8 % — AB (ref 3.00–10.60)
LYMPH%: 8.2 % — ABNORMAL LOW (ref 14.0–49.0)
MCH: 28.8 pg (ref 27.2–33.4)
MCHC: 31.8 g/dL — AB (ref 32.0–36.0)
MCV: 90.3 fL (ref 79.3–98.0)
MONO#: 1.4 10*3/uL — ABNORMAL HIGH (ref 0.1–0.9)
MONO%: 7.5 % (ref 0.0–14.0)
NEUT%: 83.5 % — ABNORMAL HIGH (ref 39.0–75.0)
NEUTROS ABS: 16 10*3/uL — AB (ref 1.5–6.5)
PLATELETS: 154 10*3/uL (ref 140–400)
RBC: 3.55 10*6/uL — AB (ref 4.20–5.82)
RDW: 15.2 % — AB (ref 11.0–14.6)
RETIC %: 4.51 % — AB (ref 0.80–1.80)
RETIC CT ABS: 160.11 10*3/uL — AB (ref 34.80–93.90)
WBC: 19.1 10*3/uL — AB (ref 4.0–10.3)
lymph#: 1.6 10*3/uL (ref 0.9–3.3)

## 2015-12-17 LAB — COMPREHENSIVE METABOLIC PANEL
ALT: 25 U/L (ref 0–55)
ANION GAP: 9 meq/L (ref 3–11)
AST: 20 U/L (ref 5–34)
Albumin: 3.6 g/dL (ref 3.5–5.0)
Alkaline Phosphatase: 78 U/L (ref 40–150)
BUN: 12.5 mg/dL (ref 7.0–26.0)
CHLORIDE: 105 meq/L (ref 98–109)
CO2: 28 meq/L (ref 22–29)
CREATININE: 0.8 mg/dL (ref 0.7–1.3)
Calcium: 8.7 mg/dL (ref 8.4–10.4)
EGFR: >90 mL/min/{1.73_m2} (ref >90)
GLUCOSE: 87 mg/dL (ref 70–140)
Potassium: 3.9 mEq/L (ref 3.5–5.1)
SODIUM: 143 meq/L (ref 136–145)
Total Bilirubin: <0.3 mg/dL (ref 0.20–1.20)
Total Protein: 6 g/dL — ABNORMAL LOW (ref 6.4–8.3)

## 2015-12-17 LAB — LACTATE DEHYDROGENASE: LDH: 407 U/L — ABNORMAL HIGH (ref 125–245)

## 2015-12-17 MED ORDER — HYDROCODONE-ACETAMINOPHEN 7.5-325 MG PO TABS: 1.0000 | ORAL_TABLET | Freq: Four times a day (QID) | ORAL | Status: DC | PRN

## 2015-12-17 MED ORDER — HEPARIN SOD (PORK) LOCK FLUSH 100 UNIT/ML IV SOLN
500.0000 [IU] | Freq: Once | INTRAVENOUS | Status: AC
  Filled 2015-12-17: qty 5

## 2015-12-17 MED ORDER — SODIUM CHLORIDE 0.9% FLUSH
10.0000 mL | Freq: Once | INTRAVENOUS | Status: AC
  Filled 2015-12-17: qty 10

## 2015-12-17 MED ORDER — OXYCODONE HCL ER 10 MG PO T12A
10.0000 mg | EXTENDED_RELEASE_TABLET | Freq: Two times a day (BID) | ORAL | Status: DC
Start: 2015-12-17 — End: 2016-04-17

## 2015-12-17 NOTE — Telephone Encounter (Signed)
per pof to sch pt appt-cld & spoke to pt and gave pt time & date of next appt °

## 2015-12-21 ENCOUNTER — Other Ambulatory Visit: Payer: Self-pay | Admitting: Family Medicine

## 2015-12-21 DIAGNOSIS — E049 Nontoxic goiter, unspecified: Secondary | ICD-10-CM

## 2015-12-22 NOTE — Progress Notes (Signed)
Donald [name] Kitchen    HEMATOLOGY/ONCOLOGY CLINIC NOTE  Date of Service: .12/17/2015   Patient Care Team: [name], DO as PCP - General (Internal Medicine)  CHIEF COMPLAINTS/PURPOSE OF CONSULTATION:  Posthospitalization follow-up for Burkitt's lymphoma  Diagnosis: Stage III Burkitt's lymphoma without CNS involvement  TREATMENT EPOCH-R + CNS prophylaxis as per NEJM 2013 Dunleavy et al. S/p 6 cycles of EPOCH-R  s/p 4 doses of intrathecal methotrexate plus hydrocortisone for CNS prophylaxis  INTERVAL HISTORY  [name] is is here for her scheduled clinic follow-up after his 6th cycle of inpatient EPOCH-R chemotherapy and 4th dose of IT methotrexate for CNS prophylaxis. He notes some fatigue and had some bodyaches as expected from his neulasta shot but has otherwise been doing well. No acute new focal symptoms. Has been scheduled for his PET/CT on 01/04/2016. He is excited to be moving into his new house soon. No other acute new symptoms.   MEDICAL HISTORY:  Past Medical History  Diagnosis Date  . EBV infection 2013    Patient notes that he had an EBV infection in 2013 that left him with chronic fatigue. She also notes that he had significant lymphadenopathy and thyroiditis at the time. He has been managing his symptoms with an alternative medicine practitioner in [place] and with other alternative medicines.  . Marijuana use, episodic   . Complication of anesthesia   . Burkitt lymphoma ([place]) 08/15/2015    SURGICAL HISTORY: Past Surgical History  Procedure Laterality Date  . Tonsillectomy    . Appendectomy    . Laparoscopy N/A 07/20/2015    Procedure: LAPAROSCOPY DIAGNOSTIC, MULTIPLE BIOPSIES, DRAINAGE OF ASCITES;  Surgeon: Fanny Skates, MD;  Location: [facility];  Service: General;  Laterality: N/A;    SOCIAL HISTORY: Social History   Social History  . Marital Status: Married    Spouse Name: N/A  . Number of Children: N/A  . Years of Education: N/A   Occupational  History  . Not on file.   Social History Main Topics  . Smoking status: Never Smoker   . Smokeless tobacco: Never Used  . Alcohol Use: No  . Drug Use: 1.00 per week    Special: Marijuana  . Sexual Activity: Yes   Other Topics Concern  . 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   He has a fiance and has been in a steady relationship.      He has tried to maintain a very healthy lifestyle and cycles about 100 miles a week. He also uses a fair number of over-the-counter alternative medicines to stay healthy.    FAMILY HISTORY: Family History  Problem Relation Age of Onset  . Heart failure Father     ALLERGIES:  is allergic to levaquin and sulfa antibiotics.  MEDICATIONS:  Current Outpatient Prescriptions  Medication Sig Dispense Refill  . HYDROcodone-acetaminophen (NORCO) 7.5-325 MG tablet Take 1-2 tablets by mouth every 6 (six) hours as needed for severe pain. 60 tablet 0  . lansoprazole (PREVACID) 30 MG capsule Take 1 capsule (30 mg total) by mouth daily at 12 noon. (Patient not taking: Reported on 12/09/2015) 30 capsule 1  . metoCLOPramide (REGLAN) 10 MG tablet Take 1 tablet (10 mg total) by mouth every 6 (six) hours as needed for nausea. 30 tablet 0  . ondansetron (ZOFRAN ODT) 8 MG disintegrating tablet Take 1 tablet (8 mg total) by mouth every 8 (eight) hours  as needed for nausea or vomiting. 20 tablet 0  . ondansetron (ZOFRAN) 8 MG tablet Take 1 tablet (8 mg total) by mouth every 8 (eight) hours as needed for nausea or vomiting. 30 tablet 3  . oxyCODONE (OXYCONTIN) 10 mg 12 hr tablet Take 1 tablet (10 mg total) by mouth every 12 (twelve) hours. 60 tablet 0  . polyethylene glycol (MIRALAX / GLYCOLAX) packet Take 17 g by mouth daily. (Patient taking differently: Take 17 g by mouth daily as needed for mild constipation. ) 30 each 1  . PRESCRIPTION MEDICATION Patient receives his  chemo treatments at the [facility] at [facility] with Dr. Irene Limbo. He received Neulasta 6mg  on 08/21/15. He is on a 21 day cycle of EPOCH-R that he receives during hospitalization.    . senna-docusate (SENOKOT-S) 8.6-50 MG tablet Take 2 tablets by mouth 2 (two) times daily. (Patient taking differently: Take 2 tablets by mouth 2 (two) times daily as needed for mild constipation. ) 120 tablet 1   No current facility-administered medications for this visit.   Facility-Administered Medications Ordered in Other Visits  Medication Dose Route Frequency Provider Last Rate Last Dose  . heparin lock flush 100 unit/mL  500 Units Intravenous Once Brunetta Genera, MD      . sodium chloride flush (NS) 0.9 % injection 10 mL  10 mL Intravenous Once Brunetta Genera, MD        REVIEW OF SYSTEMS:    10 Point review of Systems was done is negative except as noted above.  PHYSICAL EXAMINATION: ECOG PERFORMANCE STATUS: 1 - Symptomatic but completely ambulatory  . Filed Vitals:   12/17/15 1513  Height: 5\' 11"  (1.803 m)  Weight: 184 lb 4.8 oz (83.598 kg)   Filed Weights   12/17/15 1513  Weight: 184 lb 4.8 oz (83.598 kg)   .Body mass index is 25.72 kg/(m^2).  GENERAL:alert, mild emotional distress, feeling somewhat overwhelmed with the treatment. SKIN: skin color, texture, turgor are normal, no rashes or significant lesions EYES: normal, conjunctiva are pink and non-injected, sclera clear OROPHARYNX:no exudate, no erythema and lips, buccal mucosa, and tongue normal  NECK: supple, no JVD, thyroid normal size, non-tender, without nodularity LYMPH:  no palpable lymphadenopathy in the cervical, axillary or inguinal LUNGS: clear to auscultation with normal respiratory effort. HEART: regular rate & rhythm,  no murmurs and no lower extremity edema ABDOMEN: abdomen soft, non-tender, normoactive bowel sounds  Musculoskeletal: no cyanosis of digits and no clubbing  PSYCH: alert & oriented x  3 with fluent speech NEURO: no focal motor/sensory deficits  LABORATORY DATA:  I have reviewed the data as listed  . CBC Latest Ref Rng 12/17/2015 12/09/2015 12/07/2015  WBC 4.0 - 10.3 10e3/uL 19.1(H) 3.6(L) 3.0(L)  Hemoglobin 13.0 - 17.1 g/dL 10.2(L) 11.4(L) 10.9(L)  Hematocrit 38.4 - 49.9 % 32.1(L) 34.7(L) 32.4(L)  Platelets 140 - 400 10e3/uL 154 234 216    . CMP Latest Ref Rng 12/17/2015 12/09/2015 12/07/2015  Glucose 70 - 140 mg/dl 87 90 136(H)  BUN 7.0 - 26.0 mg/dL 12.5 28(H) 18  Creatinine 0.7 - 1.3 mg/dL 0.8 0.65 0.62  Sodium 136 - 145 mEq/L 143 141 139  Potassium 3.5 - 5.1 mEq/L 3.9 3.2(L) 4.2  Chloride 101 - 111 mmol/L - 106 103  CO2 22 - 29 mEq/L 28 27 27   Calcium 8.4 - 10.4 mg/dL 8.7 8.7(L) 8.5(L)  Total Protein 6.4 - 8.3 g/dL 6.0(L) 6.0(L) 5.6(L)  Total Bilirubin 0.20 - 1.20 mg/dL <0.30  1.0 1.0  Alkaline Phos 40 - 150 U/L 78 39 38  AST 5 - 34 U/L 20 19 24   ALT 0 - 55 U/L 25 33 37   . Lab Results  Component Value Date   LDH 407* 12/17/2015    RADIOGRAPHIC STUDIES: I have personally reviewed the radiological images as listed and agreed with the findings in the report. Dg Fluoro Guided Loc Of Needle/cath Tip For Spinal Inject Rt  12/04/2015  CLINICAL DATA:  Burkitt's lymphoma EXAM: FLUOROSCOPICALLY GUIDED LUMBAR PUNCTURE FOR INTRATHECAL CHEMOTHERAPY FLUOROSCOPY TIME:  1 minutes 7 seconds PROCEDURE: Informed consent was obtained from the patient prior to the procedure, including potential complications of headache, allergy, and pain. With the patient prone, the lower back was prepped with Betadine. 1% Lidocaine was used for local anesthesia. Lumbar puncture was performed at the L3-4 level using a gauge needle with return of clearCSF. 10 cc of methotrexate was injected into the subarachnoid space. The patient tolerated the procedure well without apparent complication. IMPRESSION: Technically successful lumbar puncture with removal of 9 cc of clear CSF and intrathecal  administration of 10 cc of methotrexate. Electronically Signed   By: Kerby Moors M.D.   On: 12/04/2015 13:51   ASSESSMENT & PLAN:   Patient is a 40 yo male admitted with   1) High risk Burkitt's lymphoma stage III with no CNS involvement. 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 Patient is status post 6 cycles of EPOCH-R and received 4 doses of intrathecal methotrexate for CNS prophylaxis. His LDH level is down from 1200s to 300 to 188 to 155. PET/CT 08/22/2015 after 2 cycles of treatment. Complete metabolic response. No residual hypermetabolic lymphoma. patient has no clinical evidence of disease progression at this time.  2) Acute sinusitis resolved. 3) leucocytosis/neutrophilia likely from Neulasta shot Plan  - Patient is feeling well. Resolving bodyaches from the Neulasta. No new LNadenopathy. No headaches/changes in vision/focal neurological deficits. -LDH levels noted to elevated to 407 after the clinic visit. -called patient - rpt labs on 12/24/2015 to confirm lab variation/hemolyzed sample. -will try to move his PET/CT to as early a date as possible. -if LDH wnl and PET/CT shows that patient remaining in complete remission then will switch to q54month followup with labs. -if any concern for recurrence would like need to be referred to a HSCT center for consideration of High dose ChemoRx and transplant.  I spent 20 minutes counseling the patient face to face. The total time spent in the appointment was 25 minutes and more than 50% was on counseling and direct patient cares.    Sullivan Lone MD [place] AAHIVMS [facility] Hematology/Oncology Physician [place]  (Office):       [phone] (Work cell):  [phone] (Fax):           [phone]

## 2015-12-24 ENCOUNTER — Other Ambulatory Visit: Payer: Self-pay | Admitting: *Deleted

## 2015-12-24 ENCOUNTER — Ambulatory Visit (HOSPITAL_BASED_OUTPATIENT_CLINIC_OR_DEPARTMENT_OTHER): Payer: BLUE CROSS/BLUE SHIELD

## 2015-12-24 DIAGNOSIS — C8378 Burkitt lymphoma, lymph nodes of multiple sites: Secondary | ICD-10-CM

## 2015-12-24 LAB — CBC & DIFF AND RETIC
BASO%: 0.5 % (ref 0.0–2.0)
Basophils Absolute: 0 10e3/uL (ref 0.0–0.1)
EOS%: 0.2 % (ref 0.0–7.0)
Eosinophils Absolute: 0 10e3/uL (ref 0.0–0.5)
HCT: 36.1 % — ABNORMAL LOW (ref 38.4–49.9)
HGB: 11.8 g/dL — ABNORMAL LOW (ref 13.0–17.1)
Immature Retic Fract: 7.8 % (ref 3.00–10.60)
LYMPH%: 16.2 % (ref 14.0–49.0)
MCH: 29.6 pg (ref 27.2–33.4)
MCHC: 32.7 g/dL (ref 32.0–36.0)
MCV: 90.5 fL (ref 79.3–98.0)
MONO#: 0.6 10e3/uL (ref 0.1–0.9)
MONO%: 9.5 % (ref 0.0–14.0)
NEUT#: 4.8 10e3/uL (ref 1.5–6.5)
NEUT%: 73.6 % (ref 39.0–75.0)
Platelets: 229 10e3/uL (ref 140–400)
RBC: 3.99 10e6/uL — ABNORMAL LOW (ref 4.20–5.82)
RDW: 15 % — ABNORMAL HIGH (ref 11.0–14.6)
Retic %: 1.1 % (ref 0.80–1.80)
Retic Ct Abs: 43.89 10e3/uL (ref 34.80–93.90)
WBC: 6.5 10e3/uL (ref 4.0–10.3)
lymph#: 1.1 10e3/uL (ref 0.9–3.3)

## 2015-12-24 LAB — COMPREHENSIVE METABOLIC PANEL
ALBUMIN: 3.9 g/dL (ref 3.5–5.0)
ALK PHOS: 70 U/L (ref 40–150)
ALT: 27 U/L (ref 0–55)
ANION GAP: 10 meq/L (ref 3–11)
AST: 22 U/L (ref 5–34)
BUN: 15 mg/dL (ref 7.0–26.0)
CALCIUM: 8.9 mg/dL (ref 8.4–10.4)
CHLORIDE: 104 meq/L (ref 98–109)
CO2: 27 mEq/L (ref 22–29)
Creatinine: 0.8 mg/dL (ref 0.7–1.3)
EGFR: >90 mL/min/{1.73_m2} (ref >90)
Glucose: 85 mg/dl (ref 70–140)
POTASSIUM: 4 meq/L (ref 3.5–5.1)
Sodium: 140 mEq/L (ref 136–145)
Total Bilirubin: <0.3 mg/dL (ref 0.20–1.20)
Total Protein: 6.5 g/dL (ref 6.4–8.3)

## 2015-12-24 LAB — URIC ACID: Uric Acid, Serum: 6.2 mg/dL (ref 2.6–7.4)

## 2015-12-24 LAB — LACTATE DEHYDROGENASE: LDH: 215 U/L (ref 125–245)

## 2016-01-02 ENCOUNTER — Other Ambulatory Visit: Payer: BLUE CROSS/BLUE SHIELD

## 2016-01-04 ENCOUNTER — Ambulatory Visit (HOSPITAL_COMMUNITY): Payer: BLUE CROSS/BLUE SHIELD

## 2016-01-07 ENCOUNTER — Other Ambulatory Visit: Payer: Self-pay | Admitting: Hematology

## 2016-01-07 ENCOUNTER — Encounter (HOSPITAL_COMMUNITY)
Admission: RE | Admit: 2016-01-07 | Discharge: 2016-01-07 | Disposition: A | Discharge status: Home / Self Care | Payer: BLUE CROSS/BLUE SHIELD | Source: Ambulatory Visit | Attending: Hematology | Admitting: Hematology

## 2016-01-07 DIAGNOSIS — C8378 Burkitt lymphoma, lymph nodes of multiple sites: Secondary | ICD-10-CM | POA: Insufficient documentation

## 2016-01-07 DIAGNOSIS — Z9221 Personal history of antineoplastic chemotherapy: Secondary | ICD-10-CM | POA: Diagnosis not present

## 2016-01-07 LAB — GLUCOSE, CAPILLARY: GLUCOSE-CAPILLARY: 77 mg/dL (ref 65–99)

## 2016-01-07 MED ORDER — FLUDEOXYGLUCOSE F - 18 (FDG) INJECTION
9.6000 | Freq: Once | INTRAVENOUS | Status: AC | PRN
  Administered 2016-01-07: 9.6 via INTRAVENOUS

## 2016-01-08 ENCOUNTER — Other Ambulatory Visit: Payer: Self-pay | Admitting: Hematology

## 2016-01-08 ENCOUNTER — Telehealth: Payer: Self-pay | Admitting: Hematology

## 2016-01-08 NOTE — Telephone Encounter (Signed)
EXAM: NUCLEAR MEDICINE PET SKULL BASE TO THIGH  TECHNIQUE: 9.6 mCi F-18 FDG was injected intravenously. Full-ring PET imaging was performed from the skull base to thigh after the radiotracer. CT data was obtained and used for attenuation correction and anatomic localization.  FASTING BLOOD GLUCOSE: Value: 77 mg/dl  COMPARISON: 08/22/2015  FINDINGS: NECK  No hypermetabolic lymph nodes in the neck.  CHEST  No hypermetabolic mediastinal or hilar nodes. No suspicious pulmonary nodules on the CT scan.  ABDOMEN/PELVIS  No abnormal hypermetabolic activity within the liver, pancreas, adrenal glands, or spleen. No hypermetabolic lymph nodes in the abdomen or pelvis. 2 mm nonobstructing calculus again noted in lower pole of right kidney. No evidence hydronephrosis.  SKELETON  No focal hypermetabolic activity to suggest skeletal metastasis. Decreased in diffuse posttreatment bone marrow uptake noted compared to prior study.  IMPRESSION: No evidence of metabolically active lymphoma.   Electronically Signed  By: Earle Gell M.D.  On: 01/07/2016 16:24   ----------------------------------------------------------- + Called the patient and discussed his PET scan results with him. He is pleased that there is no evidence of lymphoma and hopes that it stays away.  He was recommended to keep his follow-up clinic appointment with labs on 02/14/2016. We also discussed that we might consider taking his port out if he feels okay on follow-up. He was given the option of removing the port immediately as well and would like to decide on follow-up.  Sullivan Lone MD MS Hematology/Oncology Physician [facility]

## 2016-01-14 ENCOUNTER — Other Ambulatory Visit: Payer: Self-pay | Admitting: Family Medicine

## 2016-02-06 ENCOUNTER — Telehealth: Payer: Self-pay | Admitting: Hematology

## 2016-02-06 NOTE — Telephone Encounter (Signed)
pt cld to r/s appt-gave r/s time & date 

## 2016-02-12 ENCOUNTER — Ambulatory Visit (HOSPITAL_BASED_OUTPATIENT_CLINIC_OR_DEPARTMENT_OTHER): Payer: BLUE CROSS/BLUE SHIELD | Admitting: Hematology

## 2016-02-12 ENCOUNTER — Encounter: Payer: Self-pay | Admitting: Hematology

## 2016-02-12 ENCOUNTER — Other Ambulatory Visit: Payer: Self-pay | Admitting: *Deleted

## 2016-02-12 ENCOUNTER — Other Ambulatory Visit (HOSPITAL_BASED_OUTPATIENT_CLINIC_OR_DEPARTMENT_OTHER): Payer: BLUE CROSS/BLUE SHIELD

## 2016-02-12 VITALS — BP 128/66 | HR 66 | Temp 98.8°F | Resp 18 | Wt 192.7 lb

## 2016-02-12 DIAGNOSIS — C8370 Burkitt lymphoma, unspecified site: Secondary | ICD-10-CM

## 2016-02-12 DIAGNOSIS — C8378 Burkitt lymphoma, lymph nodes of multiple sites: Secondary | ICD-10-CM

## 2016-02-12 LAB — COMPREHENSIVE METABOLIC PANEL
ALT: 18 U/L (ref 0–55)
ANION GAP: 8 meq/L (ref 3–11)
AST: 21 U/L (ref 5–34)
Albumin: 3.8 g/dL (ref 3.5–5.0)
Alkaline Phosphatase: 60 U/L (ref 40–150)
BUN: 18.2 mg/dL (ref 7.0–26.0)
CHLORIDE: 104 meq/L (ref 98–109)
CO2: 30 meq/L — AB (ref 22–29)
Calcium: 9.2 mg/dL (ref 8.4–10.4)
Creatinine: 0.9 mg/dL (ref 0.7–1.3)
Glucose: 90 mg/dl (ref 70–140)
Potassium: 4.3 mEq/L (ref 3.5–5.1)
Sodium: 142 mEq/L (ref 136–145)
Total Bilirubin: <0.3 mg/dL (ref 0.20–1.20)
Total Protein: 6.7 g/dL (ref 6.4–8.3)

## 2016-02-12 LAB — CBC & DIFF AND RETIC
BASO%: 0.4 % (ref 0.0–2.0)
Basophils Absolute: 0 10*3/uL (ref 0.0–0.1)
EOS ABS: 0.3 10*3/uL (ref 0.0–0.5)
EOS%: 3.3 % (ref 0.0–7.0)
HCT: 39.2 % (ref 38.4–49.9)
HGB: 12.8 g/dL — ABNORMAL LOW (ref 13.0–17.1)
Immature Retic Fract: 2.9 % — ABNORMAL LOW (ref 3.00–10.60)
LYMPH#: 1.5 10*3/uL (ref 0.9–3.3)
LYMPH%: 19.2 % (ref 14.0–49.0)
MCH: 28.5 pg (ref 27.2–33.4)
MCHC: 32.7 g/dL (ref 32.0–36.0)
MCV: 87.3 fL (ref 79.3–98.0)
MONO#: 0.5 10*3/uL (ref 0.1–0.9)
MONO%: 7.2 % (ref 0.0–14.0)
NEUT#: 5.3 10*3/uL (ref 1.5–6.5)
NEUT%: 69.9 % (ref 39.0–75.0)
PLATELETS: 243 10*3/uL (ref 140–400)
RBC: 4.49 10*6/uL (ref 4.20–5.82)
RDW: 13.3 % (ref 11.0–14.6)
Retic %: 0.79 % — ABNORMAL LOW (ref 0.80–1.80)
Retic Ct Abs: 35.47 10*3/uL (ref 34.80–93.90)
WBC: 7.6 10*3/uL (ref 4.0–10.3)

## 2016-02-13 ENCOUNTER — Telehealth: Payer: Self-pay | Admitting: Hematology

## 2016-02-13 NOTE — Telephone Encounter (Signed)
per pof to sch pt appt-cld pt and left message of time & date of appt °

## 2016-02-13 NOTE — Progress Notes (Signed)
[name]    HEMATOLOGY/ONCOLOGY CLINIC NOTE  Date of Service: 02/12/2016  Patient Care Team: Donald [name], DO as PCP - General (Internal Medicine)  CHIEF COMPLAINTS/PURPOSE OF CONSULTATION:  Posthospitalization follow-up for Burkitt's lymphoma  Diagnosis: Stage III Burkitt's lymphoma without CNS involvement  TREATMENT EPOCH-R + CNS prophylaxis as per NEJM 2013 Dunleavy et al. S/p 6 cycles of EPOCH-R  s/p 4 doses of intrathecal methotrexate plus hydrocortisone for CNS prophylaxis  INTERVAL HISTORY  [name] is is here for her scheduled clinic follow-up for his Burkitt's lymphoma.  He notes no fevers no chills no night sweats.   He notes that several members and his family have a respiratory infection which he seems to have picked up.  Was having nasal drainage and cough and runny eyes which have significantly improved and are resolving at this time.   We discussed that if this persists or gets worse that he would have a low threshold for using antibiotics.  Overall his energy levels have been improving. He has been very busy at work running his [employer].  He notes that he has been working on his house and his mother's house and Recently finished some tiling work and is having a little bit of rt wrist pain. He notes that he went to his alternative medicine physician and did receive high dose vitamin C which likely led him to having burning paresthesias in his feet which have since resolved.  No headaches.  No focal neurological deficits.  No other concerning new focal symptoms. His labs are stable today.  MEDICAL HISTORY:  Past Medical History  Diagnosis Date  . EBV infection 2013    Patient notes that he had an EBV infection in 2013 that left him with chronic fatigue. She also notes that he had significant lymphadenopathy and thyroiditis at the time. He has been managing his symptoms with an alternative medicine practitioner in [place] and with other  alternative medicines.  . Marijuana use, episodic   . Complication of anesthesia   . Burkitt lymphoma ([place]) 08/15/2015    SURGICAL HISTORY: Past Surgical History  Procedure Laterality Date  . Tonsillectomy    . Appendectomy    . Laparoscopy N/A 07/20/2015    Procedure: LAPAROSCOPY DIAGNOSTIC, MULTIPLE BIOPSIES, DRAINAGE OF ASCITES;  Surgeon: Fanny Skates, MD;  Location: [facility];  Service: General;  Laterality: N/A;    SOCIAL HISTORY: Social History   Social History  . Marital Status: Married    Spouse Name: N/A  . Number of Children: N/A  . Years of Education: N/A   Occupational History  . Not on file.   Social History Main Topics  . Smoking status: Never Smoker   . Smokeless tobacco: Never Used  . Alcohol Use: No  . Drug Use: 1.00 per week    Special: Marijuana  . Sexual Activity: Yes   Other Topics Concern  . 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   He has a fiance and has been in a steady relationship.      He has tried to maintain a very healthy lifestyle and cycles about 100 miles a week. He also uses a fair number of over-the-counter alternative medicines to stay healthy.    FAMILY HISTORY: Family History  Problem Relation Age of Onset  . Heart failure Father     ALLERGIES:  is allergic to levaquin and sulfa antibiotics.  MEDICATIONS:  Current Outpatient Prescriptions  Medication Sig Dispense Refill  . HYDROcodone-acetaminophen (NORCO) 7.5-325 MG tablet Take 1-2 tablets by mouth every 6 (six) hours as needed for severe pain. 60 tablet 0  . lansoprazole (PREVACID) 30 MG capsule Take 1 capsule (30 mg total) by mouth daily at 12 noon. (Patient not taking: Reported on 12/09/2015) 30 capsule 1  . metoCLOPramide (REGLAN) 10 MG tablet Take 1 tablet (10 mg total) by mouth every 6 (six) hours as needed for nausea. 30 tablet 0  . ondansetron (ZOFRAN ODT) 8 MG  disintegrating tablet Take 1 tablet (8 mg total) by mouth every 8 (eight) hours as needed for nausea or vomiting. 20 tablet 0  . ondansetron (ZOFRAN) 8 MG tablet Take 1 tablet (8 mg total) by mouth every 8 (eight) hours as needed for nausea or vomiting. 30 tablet 3  . oxyCODONE (OXYCONTIN) 10 mg 12 hr tablet Take 1 tablet (10 mg total) by mouth every 12 (twelve) hours. 60 tablet 0  . polyethylene glycol (MIRALAX / GLYCOLAX) packet Take 17 g by mouth daily. (Patient taking differently: Take 17 g by mouth daily as needed for mild constipation. ) 30 each 1  . PRESCRIPTION MEDICATION Patient receives his chemo treatments at the [facility] at [facility] with Dr. Irene Limbo. He received Neulasta 6mg  on 08/21/15. He is on a 21 day cycle of EPOCH-R that he receives during hospitalization.    . senna-docusate (SENOKOT-S) 8.6-50 MG tablet Take 2 tablets by mouth 2 (two) times daily. (Patient taking differently: Take 2 tablets by mouth 2 (two) times daily as needed for mild constipation. ) 120 tablet 1   No current facility-administered medications for this visit.   Facility-Administered Medications Ordered in Other Visits  Medication Dose Route Frequency Provider Last Rate Last Dose  . heparin lock flush 100 unit/mL  500 Units Intravenous Once Brunetta Genera, MD      . sodium chloride flush (NS) 0.9 % injection 10 mL  10 mL Intravenous Once Brunetta Genera, MD        REVIEW OF SYSTEMS:    10 Point review of Systems was done is negative except as noted above.  PHYSICAL EXAMINATION: ECOG PERFORMANCE STATUS: 1 - Symptomatic but completely ambulatory  . Filed Vitals:   02/12/16 1450  Weight: 192 lb 11.2 oz (87.408 kg)   Filed Weights   02/12/16 1450  Weight: 192 lb 11.2 oz (87.408 kg)   .Body mass index is 26.89 kg/(m^2).  GENERAL:alert, mild emotional distress, feeling somewhat overwhelmed with the treatment. SKIN: skin color, texture, turgor are normal, no rashes or  significant lesions EYES: normal, conjunctiva are pink and non-injected, sclera clear OROPHARYNX:no exudate, no erythema and lips, buccal mucosa, and tongue normal , coated tongue.  No sinus pain or tenderness.  No significant pharyngeal erythema or exudates. NECK: supple, no JVD, thyroid normal size, non-tender, without nodularity LYMPH:  no palpable lymphadenopathy in the cervical, axillary or inguinal LUNGS: clear to auscultation with normal respiratory effort. HEART: regular rate & rhythm,  no murmurs and no lower extremity edema ABDOMEN: abdomen soft, non-tender, normoactive bowel sounds ,No palpable hepatosplenomegaly.  Patient voluntarily guarding and since he is quite ticklish. Musculoskeletal: no cyanosis of digits and no clubbing  PSYCH: alert & oriented x 3 with fluent speech NEURO: no focal motor/sensory deficits  LABORATORY DATA:  I have reviewed the data as listed  . CBC Latest Ref Rng 02/12/2016 12/24/2015 12/17/2015  WBC 4.0 - 10.3 10e3/uL 7.6  6.5 19.1(H)  Hemoglobin 13.0 - 17.1 g/dL 12.8(L) 11.8(L) 10.2(L)  Hematocrit 38.4 - 49.9 % 39.2 36.1(L) 32.1(L)  Platelets 140 - 400 10e3/uL 243 229 154    . CMP Latest Ref Rng 02/12/2016 12/24/2015 12/17/2015  Glucose 70 - 140 mg/dl 90 85 87  BUN 7.0 - 26.0 mg/dL 18.2 15.0 12.5  Creatinine 0.7 - 1.3 mg/dL 0.9 0.8 0.8  Sodium 136 - 145 mEq/L 142 140 143  Potassium 3.5 - 5.1 mEq/L 4.3 4.0 3.9  Chloride 101 - 111 mmol/L - - -  CO2 22 - 29 mEq/L 30(H) 27 28  Calcium 8.4 - 10.4 mg/dL 9.2 8.9 8.7  Total Protein 6.4 - 8.3 g/dL 6.7 6.5 6.0(L)  Total Bilirubin 0.20 - 1.20 mg/dL <0.30 <0.30 <0.30  Alkaline Phos 40 - 150 U/L 60 70 78  AST 5 - 34 U/L 21 22 20   ALT 0 - 55 U/L 18 27 25    . Lab Results  Component Value Date   LDH 215 12/24/2015    RADIOGRAPHIC STUDIES: I have personally reviewed the radiological images as listed and agreed with the findings in the report. NUCLEAR MEDICINE PET SKULL BASE TO THIGH  01/07/2016  TECHNIQUE: 9.6 mCi F-18 FDG was injected intravenously. Full-ring PET imaging was performed from the skull base to thigh after the radiotracer. CT data was obtained and used for attenuation correction and anatomic localization.  FASTING BLOOD GLUCOSE: Value: 77 mg/dl  COMPARISON: 08/22/2015  FINDINGS: NECK  No hypermetabolic lymph nodes in the neck.  CHEST  No hypermetabolic mediastinal or hilar nodes. No suspicious pulmonary nodules on the CT scan.  ABDOMEN/PELVIS  No abnormal hypermetabolic activity within the liver, pancreas, adrenal glands, or spleen. No hypermetabolic lymph nodes in the abdomen or pelvis. 2 mm nonobstructing calculus again noted in lower pole of right kidney. No evidence hydronephrosis.  SKELETON  No focal hypermetabolic activity to suggest skeletal metastasis. Decreased in diffuse posttreatment bone marrow uptake noted compared to prior study.  IMPRESSION: No evidence of metabolically active lymphoma.   Electronically Signed  By: Earle Gell M.D.  On: 01/07/2016 16:24ASSESSMENT & PLAN:   Patient is a 40 yo male admitted with   1) High risk Burkitt's lymphoma stage III with no CNS involvement. 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 Patient is status post 6 cycles of EPOCH-R and received 4 doses of intrathecal methotrexate for CNS prophylaxis. His LDH level is down from 1200s to 300 to 188 to 155. PET/CT 08/22/2015 after 2 cycles of treatment. Complete metabolic response. No residual hypermetabolic lymphoma. That PET CT scan performed 01/07/2016 after completion of planned treatment showed no evidence of residual hypermetabolic disease. He had an LDH level done on 2/6 2017 which is elevated to 407 the patient had a respiratory infection at the time.  Repeat LDH was normal at 215.  Patient has no clinical evidence of disease progression  at this time. Labs are stable.  LDH was normal drawn today.  We'll repeat after resolution of his Upper respiratory infection  2) resolving URTI -several family members had symptoms of viral urti.  We discussed possibly doing vital respiratory panel and consideration of antibiotics but he wants to hold off at this time.  He was counseled to call us immediately if symptoms persisted or worsened. Plan -emotional counseling provided since the patient is to have some lingering understandable anxiety about whether his lymphoma will come back. -LDH was not drawn today  we shall repeat this in the next 3-4 weeks after his respiratory illness has resolved. -CBC CMP stable patient has no other symptoms suggestive of disease progression. -He was counseled to avoid high dose vitamin C (appears to have caused significant paresthesias). -we discussed the importance of adequate sleep and stress reduction since he tends to over-exert himself.  Return to care for labs in 1 month and labs and clinic visit with me in 2 months.  I spent 20 minutes counseling the patient face to face. The total time spent in the appointment was 25 minutes and more than 50% was on counseling and direct patient cares.    Sullivan Lone MD [place] AAHIVMS [facility] Hematology/Oncology Physician [place]  (Office):       [phone] (Work cell):  [phone] (Fax):           [phone]

## 2016-02-14 ENCOUNTER — Ambulatory Visit: Payer: BLUE CROSS/BLUE SHIELD | Admitting: Hematology

## 2016-02-14 ENCOUNTER — Other Ambulatory Visit: Payer: BLUE CROSS/BLUE SHIELD

## 2016-03-03 ENCOUNTER — Other Ambulatory Visit: Payer: Self-pay | Admitting: Hematology

## 2016-03-12 ENCOUNTER — Other Ambulatory Visit (HOSPITAL_BASED_OUTPATIENT_CLINIC_OR_DEPARTMENT_OTHER): Payer: BLUE CROSS/BLUE SHIELD

## 2016-03-12 ENCOUNTER — Ambulatory Visit (HOSPITAL_BASED_OUTPATIENT_CLINIC_OR_DEPARTMENT_OTHER): Payer: BLUE CROSS/BLUE SHIELD

## 2016-03-12 DIAGNOSIS — C8378 Burkitt lymphoma, lymph nodes of multiple sites: Secondary | ICD-10-CM

## 2016-03-12 DIAGNOSIS — Z452 Encounter for adjustment and management of vascular access device: Secondary | ICD-10-CM | POA: Diagnosis not present

## 2016-03-12 DIAGNOSIS — Z5111 Encounter for antineoplastic chemotherapy: Secondary | ICD-10-CM

## 2016-03-12 LAB — CBC & DIFF AND RETIC
BASO%: 1 % (ref 0.0–2.0)
BASOS ABS: 0.1 10*3/uL (ref 0.0–0.1)
EOS ABS: 0.1 10*3/uL (ref 0.0–0.5)
EOS%: 2.5 % (ref 0.0–7.0)
HEMATOCRIT: 41.8 % (ref 38.4–49.9)
HEMOGLOBIN: 13.8 g/dL (ref 13.0–17.1)
Immature Retic Fract: 2.4 % — ABNORMAL LOW (ref 3.00–10.60)
LYMPH#: 1.2 10*3/uL (ref 0.9–3.3)
LYMPH%: 22.8 % (ref 14.0–49.0)
MCH: 28.5 pg (ref 27.2–33.4)
MCHC: 33 g/dL (ref 32.0–36.0)
MCV: 86.4 fL (ref 79.3–98.0)
MONO#: 0.5 10*3/uL (ref 0.1–0.9)
MONO%: 8.9 % (ref 0.0–14.0)
NEUT#: 3.3 10*3/uL (ref 1.5–6.5)
NEUT%: 64.8 % (ref 39.0–75.0)
PLATELETS: 195 10*3/uL (ref 140–400)
RBC: 4.84 10*6/uL (ref 4.20–5.82)
RDW: 13.4 % (ref 11.0–14.6)
Retic %: 0.94 % (ref 0.80–1.80)
Retic Ct Abs: 45.5 10*3/uL (ref 34.80–93.90)
WBC: 5.1 10*3/uL (ref 4.0–10.3)

## 2016-03-12 LAB — LACTATE DEHYDROGENASE: LDH: 161 U/L (ref 125–245)

## 2016-03-12 MED ORDER — PEGFILGRASTIM INJECTION 6 MG/0.6ML
6.0000 mg | Freq: Once | SUBCUTANEOUS | Status: DC
  Filled 2016-03-12: qty 0.6

## 2016-03-12 MED ORDER — HEPARIN SOD (PORK) LOCK FLUSH 100 UNIT/ML IV SOLN
500.0000 [IU] | Freq: Once | INTRAVENOUS | Status: AC | PRN
  Administered 2016-03-12: 500 [IU] via INTRAVENOUS
  Filled 2016-03-12: qty 5

## 2016-03-12 MED ORDER — SODIUM CHLORIDE 0.9 % IJ SOLN
10.0000 mL | INTRAMUSCULAR | Status: DC | PRN
Start: 2016-03-12 — End: 2016-03-12
  Administered 2016-03-12: 10 mL via INTRAVENOUS
  Filled 2016-03-12: qty 10

## 2016-03-13 ENCOUNTER — Other Ambulatory Visit: Payer: Self-pay | Admitting: Hematology

## 2016-03-13 DIAGNOSIS — C8378 Burkitt lymphoma, lymph nodes of multiple sites: Secondary | ICD-10-CM

## 2016-03-18 ENCOUNTER — Other Ambulatory Visit: Payer: Self-pay | Admitting: General Surgery

## 2016-03-19 ENCOUNTER — Ambulatory Visit (HOSPITAL_COMMUNITY)
Admission: RE | Admit: 2016-03-19 | Discharge: 2016-03-19 | Disposition: A | Discharge status: Home / Self Care | Payer: BLUE CROSS/BLUE SHIELD | Source: Ambulatory Visit | Attending: Hematology | Admitting: Hematology

## 2016-03-19 ENCOUNTER — Encounter (HOSPITAL_COMMUNITY): Payer: Self-pay

## 2016-03-19 DIAGNOSIS — C8378 Burkitt lymphoma, lymph nodes of multiple sites: Secondary | ICD-10-CM

## 2016-03-19 DIAGNOSIS — Z538 Procedure and treatment not carried out for other reasons: Secondary | ICD-10-CM | POA: Insufficient documentation

## 2016-03-19 LAB — APTT: aPTT: 30 seconds (ref 24–37)

## 2016-03-19 LAB — CBC
HEMATOCRIT: 43.7 % (ref 39.0–52.0)
HEMOGLOBIN: 14.5 g/dL (ref 13.0–17.0)
MCH: 28.3 pg (ref 26.0–34.0)
MCHC: 33.2 g/dL (ref 30.0–36.0)
MCV: 85.4 fL (ref 78.0–100.0)
Platelets: 206 10*3/uL (ref 150–400)
RBC: 5.12 MIL/uL (ref 4.22–5.81)
RDW: 13.3 % (ref 11.5–15.5)
WBC: 5.3 10*3/uL (ref 4.0–10.5)

## 2016-03-19 LAB — PROTIME-INR
INR: 1.02 (ref 0.00–1.49)
PROTHROMBIN TIME: 13.6 s (ref 11.6–15.2)

## 2016-03-19 MED ORDER — CEFAZOLIN SODIUM-DEXTROSE 2-4 GM/100ML-% IV SOLN
2.0000 g | INTRAVENOUS | Status: DC
  Filled 2016-03-19: qty 100

## 2016-03-19 MED ORDER — SODIUM CHLORIDE 0.9 % IV SOLN
INTRAVENOUS | Status: DC
  Administered 2016-03-19: 13:00:00 via INTRAVENOUS

## 2016-03-19 NOTE — Progress Notes (Signed)
Procedure cancelled per IR,due to emergency case,  pt. Rescheduled for Friday at 12:30, pt. Aware of when to return.

## 2016-03-20 ENCOUNTER — Other Ambulatory Visit: Payer: Self-pay | Admitting: Radiology

## 2016-03-21 ENCOUNTER — Ambulatory Visit (HOSPITAL_COMMUNITY)
Admission: RE | Admit: 2016-03-21 | Discharge: 2016-03-21 | Disposition: A | Discharge status: Home / Self Care | Payer: BLUE CROSS/BLUE SHIELD | Source: Ambulatory Visit | Attending: Hematology | Admitting: Hematology

## 2016-03-21 ENCOUNTER — Encounter (HOSPITAL_COMMUNITY): Payer: Self-pay

## 2016-03-21 DIAGNOSIS — C8378 Burkitt lymphoma, lymph nodes of multiple sites: Secondary | ICD-10-CM | POA: Diagnosis not present

## 2016-03-21 DIAGNOSIS — Z452 Encounter for adjustment and management of vascular access device: Secondary | ICD-10-CM | POA: Insufficient documentation

## 2016-03-21 DIAGNOSIS — Z79899 Other long term (current) drug therapy: Secondary | ICD-10-CM | POA: Insufficient documentation

## 2016-03-21 DIAGNOSIS — Z882 Allergy status to sulfonamides status: Secondary | ICD-10-CM | POA: Diagnosis not present

## 2016-03-21 DIAGNOSIS — Z881 Allergy status to other antibiotic agents status: Secondary | ICD-10-CM | POA: Insufficient documentation

## 2016-03-21 MED ORDER — MIDAZOLAM HCL 2 MG/2ML IJ SOLN
INTRAMUSCULAR | Status: AC
  Filled 2016-03-21: qty 4

## 2016-03-21 MED ORDER — LIDOCAINE HCL 1 % IJ SOLN
INTRAMUSCULAR | Status: DC | PRN
  Administered 2016-03-21: 10 mL

## 2016-03-21 MED ORDER — FENTANYL CITRATE (PF) 100 MCG/2ML IJ SOLN
INTRAMUSCULAR | Status: AC | PRN
  Administered 2016-03-21 (×2): 25 ug via INTRAVENOUS
  Administered 2016-03-21: 50 ug via INTRAVENOUS

## 2016-03-21 MED ORDER — LIDOCAINE HCL 1 % IJ SOLN
INTRAMUSCULAR | Status: AC
  Filled 2016-03-21: qty 20

## 2016-03-21 MED ORDER — CEFAZOLIN SODIUM-DEXTROSE 2-4 GM/100ML-% IV SOLN
2.0000 g | Freq: Once | INTRAVENOUS | Status: DC
  Filled 2016-03-21: qty 100

## 2016-03-21 MED ORDER — SODIUM CHLORIDE 0.9 % IV SOLN
INTRAVENOUS | Status: DC
  Administered 2016-03-21: 14:00:00 via INTRAVENOUS

## 2016-03-21 MED ORDER — MIDAZOLAM HCL 2 MG/2ML IJ SOLN
INTRAMUSCULAR | Status: AC | PRN
  Administered 2016-03-21: 0.5 mg via INTRAVENOUS
  Administered 2016-03-21 (×2): 1 mg via INTRAVENOUS
  Administered 2016-03-21: 0.5 mg via INTRAVENOUS
  Administered 2016-03-21: 1 mg via INTRAVENOUS

## 2016-03-21 MED ORDER — FENTANYL CITRATE (PF) 100 MCG/2ML IJ SOLN
INTRAMUSCULAR | Status: AC
  Filled 2016-03-21: qty 2

## 2016-03-21 NOTE — Sedation Documentation (Signed)
Patient denies pain and is resting comfortably.  

## 2016-03-21 NOTE — Procedures (Signed)
Interventional Radiology Procedure Note  Procedure:  Port removal  Complications:  None  Estimated Blood Loss: < 10 mL  Right chest port removed with sharp and blunt dissection.  Entire port removed.  Venetia Night. Kathlene Cote, M.D Pager:  [phone]

## 2016-03-21 NOTE — Progress Notes (Signed)
Informed pt and his SO about delay in procedure time.  Informed pt it could be another hour to hour and a half.  Told pt IR is trying to arrange for another doctor to come and do him because the previous doctor is running behind with a procedure this afternoon.  Pt was disappointed but  voiced understanding.

## 2016-03-21 NOTE — Discharge Instructions (Signed)
Moderate Conscious Sedation, Adult, Care After Refer to this sheet in the next few weeks. These instructions provide you with information on caring for yourself after your procedure. Your health care provider may also give you more specific instructions. Your treatment has been planned according to current medical practices, but problems sometimes occur. Call your health care provider if you have any problems or questions after your procedure. WHAT TO EXPECT AFTER THE PROCEDURE  After your procedure:  You may feel sleepy, clumsy, and have poor balance for several hours.  Vomiting may occur if you eat too soon after the procedure. HOME CARE INSTRUCTIONS  Do not participate in any activities where you could become injured for at least 24 hours. Do not:  Drive.  Swim.  Ride a bicycle.  Operate heavy machinery.  Cook.  Use power tools.  Climb ladders.  Work from a high place.  Do not make important decisions or sign legal documents until you are improved.  If you vomit, drink water, juice, or soup when you can drink without vomiting. Make sure you have little or no nausea before eating solid foods.  Only take over-the-counter or prescription medicines for pain, discomfort, or fever as directed by your health care provider.  Make sure you and your family fully understand everything about the medicines given to you, including what side effects may occur.  You should not drink alcohol, take sleeping pills, or take medicines that cause drowsiness for at least 24 hours.  If you smoke, do not smoke without supervision.  If you are feeling better, you may resume normal activities 24 hours after you were sedated.  Keep all appointments with your health care provider. SEEK MEDICAL CARE IF:  Your skin is pale or bluish in color.  You continue to feel nauseous or vomit.  Your pain is getting worse and is not helped by medicine.  You have bleeding or swelling.  You are still  sleepy or feeling clumsy after 24 hours. SEEK IMMEDIATE MEDICAL CARE IF:  You develop a rash.  You have difficulty breathing.  You develop any type of allergic problem.  You have a fever. MAKE SURE YOU:  Understand these instructions.  Will watch your condition.  Will get help right away if you are not doing well or get worse.   This information is not intended to replace advice given to you by your health care provider. Make sure you discuss any questions you have with your health care provider.   Document Released: 08/17/2013 Document Revised: 11/17/2014 Document Reviewed: 08/17/2013 Elsevier Interactive Patient Education 2016 [place] An incision is when a surgeon cuts into your body. After surgery, the incision needs to be cared for properly to prevent infection.  HOW TO CARE FOR YOUR INCISION  Take medicines only as directed by your health care provider.  There are many different ways to close and cover an incision, including stitches, skin glue, and adhesive strips. Follow your health care provider's instructions on:  Incision care.  Bandage (dressing) changes and removal.  Incision closure removal.  Do not take baths, swim, or use a hot tub until your health care provider approves. You may shower as directed by your health care provider.  Resume your normal diet and activities as directed.  Use anti-itch medicine (such as an antihistamine) as directed by your health care provider. The incision may itch while it is healing. Do not pick or scratch at the incision.  Drink enough fluid to keep your  urine clear or pale yellow. SEEK MEDICAL CARE IF:   You have drainage, redness, swelling, or pain at your incision site.  You have muscle aches, chills, or a general ill feeling.  You notice a bad smell coming from the incision or dressing.  Your incision edges separate after the sutures, staples, or skin adhesive strips have been removed.  You  have persistent nausea or vomiting.  You have a fever.  You are dizzy. SEEK IMMEDIATE MEDICAL CARE IF:   You have a rash.  You faint.  You have difficulty breathing. MAKE SURE YOU:   Understand these instructions.  Will watch your condition.  Will get help right away if you are not doing well or get worse.   This information is not intended to replace advice given to you by your health care provider. Make sure you discuss any questions you have with your health care provider.   Document Released: 05/16/2005 Document Revised: 11/17/2014 Document Reviewed: 12/21/2013 Elsevier Interactive Patient Education [employer].

## 2016-03-21 NOTE — H&P (Signed)
Referring Physician(s): Brunetta Genera  Supervising Physician: Aletta Edouard  Patient Status: OP  Chief Complaint:   "I'm here to get my port out"  Subjective: Patient familiar to IR service from prior chest wall Port-A-Cath placement in September 2016. He has known history of stage III Burkitt's lymphoma and has completed chemotherapy. He currently has no residual hypermetabolic lymphoma and no clinical evidence of disease progression. He presents today for Port-A-Cath removal. He currently denies fever, headache, chest pain, dyspnea, cough, pain, nausea, vomiting or abnormal bleeding. He does have some occasional abdominal discomfort. Additional history as below.  Past Medical History  Diagnosis Date  . EBV infection 2013    Patient notes that he had an EBV infection in 2013 that left him with chronic fatigue. She also notes that he had significant lymphadenopathy and thyroiditis at the time. He has been managing his symptoms with an alternative medicine practitioner in [place] and with other alternative medicines.  . Marijuana use, episodic   . Complication of anesthesia   . Burkitt lymphoma ([place]) 08/15/2015   Past Surgical History  Procedure Laterality Date  . Tonsillectomy    . Appendectomy    . Laparoscopy N/A 07/20/2015    Procedure: LAPAROSCOPY DIAGNOSTIC, MULTIPLE BIOPSIES, DRAINAGE OF ASCITES;  Surgeon: Fanny Skates, MD;  Location: [facility];  Service: General;  Laterality: N/A;     Allergies: Levaquin and Sulfa antibiotics  Medications: Prior to Admission medications   Medication Sig Start Date End Date Taking? Authorizing Provider  HYDROcodone-acetaminophen (NORCO) 7.5-325 MG tablet Take 1-2 tablets by mouth every 6 (six) hours as needed for severe pain. 12/17/15  Yes Brunetta Genera, MD  oxyCODONE (OXYCONTIN) 10 mg 12 hr tablet Take 1 tablet (10 mg total) by mouth every 12 (twelve) hours. 12/17/15  Yes Brunetta Genera, MD  lansoprazole  (PREVACID) 30 MG capsule Take 1 capsule (30 mg total) by mouth daily at 12 noon. Patient not taking: Reported on 12/09/2015 10/11/15   Brunetta Genera, MD  metoCLOPramide (REGLAN) 10 MG tablet Take 1 tablet (10 mg total) by mouth every 6 (six) hours as needed for nausea. 10/14/15   Harvel Quale, MD  ondansetron (ZOFRAN ODT) 8 MG disintegrating tablet Take 1 tablet (8 mg total) by mouth every 8 (eight) hours as needed for nausea or vomiting. 12/09/15   Dorie Rank, MD  ondansetron (ZOFRAN) 8 MG tablet Take 1 tablet (8 mg total) by mouth every 8 (eight) hours as needed for nausea or vomiting. 08/03/15   Brunetta Genera, MD  polyethylene glycol (MIRALAX / GLYCOLAX) packet Take 17 g by mouth daily. Patient taking differently: Take 17 g by mouth daily as needed for mild constipation.  08/19/15   Brunetta Genera, MD  PRESCRIPTION MEDICATION Patient receives his chemo treatments at the [facility] with Dr. Irene Limbo. He received Neulasta 6mg  on 08/21/15. He is on a 21 day cycle of EPOCH-R that he receives during hospitalization.    Historical Provider, MD  senna-docusate (SENOKOT-S) 8.6-50 MG tablet Take 2 tablets by mouth 2 (two) times daily. Patient taking differently: Take 2 tablets by mouth 2 (two) times daily as needed for mild constipation.  09/21/15   Brunetta Genera, MD     Vital Signs: BP 128/61 mmHg  Pulse 58  Temp(Src) 98.1 F (36.7 C) (Oral)  Resp 16  SpO2 99%  Physical Exam patient awake, alert. Chest clear to auscultation bilaterally. Clean, intact, nontender right chest wall  Port-A-Cath. Heart with bradycardic but regular rhythm. Lower extremities with no edema.  Imaging: No results found.  Labs:  CBC:  Recent Labs  12/24/15 1039 02/12/16 1440 03/12/16 1514 03/19/16 1237  WBC 6.5 7.6 5.1 5.3  HGB 11.8* 12.8* 13.8 14.5  HCT 36.1* 39.2 41.8 43.7  PLT 229 243 195 206    COAGS:  Recent Labs  07/27/15 0444 09/05/15 1605  11/05/15 1235 03/19/16 1237  INR 1.15 1.09 1.05 1.02  APTT 24 32 33 30    BMP:  Recent Labs  12/05/15 0530 12/06/15 0410 12/07/15 0345 12/09/15 0514 12/17/15 1459 12/24/15 1039 02/12/16 1440  NA 139 143 139 141 143 140 142  K 3.9 4.4 4.2 3.2* 3.9 4.0 4.3  CL 106 109 103 106  --   --   --   CO2 24 27 27 27 28 27  30*  GLUCOSE 128* 144* 136* 90 87 85 90  BUN 18 19 18  28* 12.5 15.0 18.2  CALCIUM 8.7* 9.0 8.5* 8.7* 8.7 8.9 9.2  CREATININE 0.66 0.68 0.62 0.65 0.8 0.8 0.9  GFRNONAA >60 >60 >60 >60  --   --   --   GFRAA >60 >60 >60 >60  --   --   --     LIVER FUNCTION TESTS:  Recent Labs  12/09/15 0514 12/17/15 1459 12/24/15 1039 02/12/16 1440  BILITOT 1.0 <0.30 <0.30 <0.30  AST 19 20 22 21   ALT 33 25 27 18   ALKPHOS 39 78 70 60  PROT 6.0* 6.0* 6.5 6.7  ALBUMIN 3.6 3.6 3.9 3.8    Assessment and Plan: Pt with known history of stage III Burkitt's lymphoma and prior placement of rt chest wall port a cath; he has completed chemotherapy. He currently has no residual hypermetabolic lymphoma and no clinical evidence of disease progression. He presents today for Port-A-Cath removal. Details/risks of procedure, including but not limited to, internal bleeding, infection, discussed with patient and wife with their understanding and consent.   Electronically Signed: D. Rowe Robert 03/21/2016, 1:49 PM   I spent a total of 15 minutes at the the patient's bedside AND on the patient's hospital floor or unit, greater than 50% of which was counseling/coordinating care for port a cath removal

## 2016-04-16 ENCOUNTER — Encounter: Payer: BLUE CROSS/BLUE SHIELD | Admitting: Hematology

## 2016-04-16 ENCOUNTER — Telehealth: Payer: Self-pay | Admitting: Hematology

## 2016-04-16 ENCOUNTER — Other Ambulatory Visit: Payer: BLUE CROSS/BLUE SHIELD

## 2016-04-16 ENCOUNTER — Other Ambulatory Visit: Payer: Self-pay | Admitting: *Deleted

## 2016-04-16 NOTE — Telephone Encounter (Signed)
Apt added per pof, pt aware per pof

## 2016-04-17 ENCOUNTER — Ambulatory Visit (HOSPITAL_BASED_OUTPATIENT_CLINIC_OR_DEPARTMENT_OTHER): Payer: BLUE CROSS/BLUE SHIELD | Admitting: Hematology

## 2016-04-17 ENCOUNTER — Other Ambulatory Visit (HOSPITAL_BASED_OUTPATIENT_CLINIC_OR_DEPARTMENT_OTHER): Payer: BLUE CROSS/BLUE SHIELD

## 2016-04-17 ENCOUNTER — Encounter: Payer: Self-pay | Admitting: Hematology

## 2016-04-17 VITALS — BP 128/63 | HR 52 | Temp 98.1°F | Resp 16 | Wt 206.0 lb

## 2016-04-17 DIAGNOSIS — C8370 Burkitt lymphoma, unspecified site: Secondary | ICD-10-CM

## 2016-04-17 DIAGNOSIS — Z8572 Personal history of non-Hodgkin lymphomas: Secondary | ICD-10-CM

## 2016-04-17 DIAGNOSIS — C8378 Burkitt lymphoma, lymph nodes of multiple sites: Secondary | ICD-10-CM

## 2016-04-17 LAB — CBC & DIFF AND RETIC
BASO%: 0.6 % (ref 0.0–2.0)
BASOS ABS: 0 10*3/uL (ref 0.0–0.1)
EOS%: 4.3 % (ref 0.0–7.0)
Eosinophils Absolute: 0.2 10*3/uL (ref 0.0–0.5)
HCT: 41.5 % (ref 38.4–49.9)
HEMOGLOBIN: 13.8 g/dL (ref 13.0–17.1)
Immature Retic Fract: 2.8 % — ABNORMAL LOW (ref 3.00–10.60)
LYMPH#: 1.3 10*3/uL (ref 0.9–3.3)
LYMPH%: 24.8 % (ref 14.0–49.0)
MCH: 28.4 pg (ref 27.2–33.4)
MCHC: 33.3 g/dL (ref 32.0–36.0)
MCV: 85.4 fL (ref 79.3–98.0)
MONO#: 0.6 10*3/uL (ref 0.1–0.9)
MONO%: 11.2 % (ref 0.0–14.0)
NEUT#: 3.1 10*3/uL (ref 1.5–6.5)
NEUT%: 59.1 % (ref 39.0–75.0)
PLATELETS: 195 10*3/uL (ref 140–400)
RBC: 4.86 10*6/uL (ref 4.20–5.82)
RDW: 13.6 % (ref 11.0–14.6)
Retic %: 0.85 % (ref 0.80–1.80)
Retic Ct Abs: 41.31 10*3/uL (ref 34.80–93.90)
WBC: 5.2 10*3/uL (ref 4.0–10.3)

## 2016-04-17 LAB — COMPREHENSIVE METABOLIC PANEL
ALBUMIN: 4 g/dL (ref 3.5–5.0)
ALK PHOS: 62 U/L (ref 40–150)
ALT: 22 U/L (ref 0–55)
ANION GAP: 8 meq/L (ref 3–11)
AST: 20 U/L (ref 5–34)
BUN: 22.5 mg/dL (ref 7.0–26.0)
CALCIUM: 8.9 mg/dL (ref 8.4–10.4)
CO2: 29 mEq/L (ref 22–29)
Chloride: 103 mEq/L (ref 98–109)
Creatinine: 0.9 mg/dL (ref 0.7–1.3)
GLUCOSE: 96 mg/dL (ref 70–140)
POTASSIUM: 4.1 meq/L (ref 3.5–5.1)
SODIUM: 140 meq/L (ref 136–145)
Total Protein: 6.7 g/dL (ref 6.4–8.3)

## 2016-04-17 LAB — LACTATE DEHYDROGENASE: LDH: 176 U/L (ref 125–245)

## 2016-04-17 NOTE — Progress Notes (Signed)
This encounter was created in error - please disregard.

## 2016-04-18 NOTE — Progress Notes (Signed)
Donald [name]    HEMATOLOGY/ONCOLOGY CLINIC NOTE  Date of Service: 04/17/2016   Patient Care Team: [name], DO as PCP - General (Internal Medicine)  CHIEF COMPLAINTS/PURPOSE OF CONSULTATION:  Posthospitalization follow-up for Burkitt's lymphoma  Diagnosis: Stage III Burkitt's lymphoma without CNS involvement  TREATMENT EPOCH-R + CNS prophylaxis as per NEJM 2013 Dunleavy et al. S/p 6 cycles of EPOCH-R  s/p 4 doses of intrathecal methotrexate plus hydrocortisone for CNS prophylaxis  INTERVAL HISTORY  [name] is is here for her scheduled 64month clinic follow-up for his Burkitt's lymphoma.   He notes that he has been feeling well and has gained weight since his last clinic visit. 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 No fevers no chills no night sweats or weight loss no other acute new focal symptoms. Has been following up with his alternative medicine doctor and has received "Meyer's IV's with glutathione". He notes that he feels nauseous and down for about 3 days after this and then feels better. He is glad about having moved into his new house.   MEDICAL HISTORY:  Past Medical History  Diagnosis Date  . EBV infection 2013    Patient notes that he had an EBV infection in 2013 that left him with chronic fatigue. She also notes that he had significant lymphadenopathy and thyroiditis at the time. He has been managing his symptoms with an alternative medicine practitioner in [place] and with other alternative medicines.  . Marijuana use, episodic   . Complication of anesthesia   . Burkitt lymphoma ([place]) 08/15/2015    SURGICAL HISTORY: Past Surgical History  Procedure Laterality Date  . Tonsillectomy    . Appendectomy    . Laparoscopy N/A 07/20/2015    Procedure: LAPAROSCOPY DIAGNOSTIC, MULTIPLE BIOPSIES, DRAINAGE OF ASCITES;  Surgeon: Fanny Skates, MD;  Location: [facility];  Service: General;  Laterality: N/A;    SOCIAL HISTORY: Social History     Social History  . Marital Status: Married    Spouse Name: N/A  . Number of Children: N/A  . Years of Education: N/A   Occupational History  . Not on file.   Social History Main Topics  . Smoking status: Never Smoker   . Smokeless tobacco: Never Used  . Alcohol Use: No  . Drug Use: 1.00 per week    Special: Marijuana  . Sexual Activity: Yes   Other Topics Concern  . 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   He has a fiance and has been in a steady relationship.      He has tried to maintain a very healthy lifestyle and cycles about 100 miles a week. He also uses a fair number of over-the-counter alternative medicines to stay healthy.    FAMILY HISTORY: Family History  Problem Relation Age of Onset  . Heart failure Father     ALLERGIES:  is allergic to levaquin and sulfa antibiotics.  MEDICATIONS:  Current Outpatient Prescriptions  Medication Sig Dispense Refill  . HYDROcodone-acetaminophen (NORCO) 7.5-325 MG tablet Take 1-2 tablets by mouth every 6 (six) hours as needed for severe pain. 60 tablet 0   No current facility-administered medications for this visit.   Facility-Administered Medications Ordered in Other Visits  Medication Dose Route Frequency Provider Last Rate Last Dose  . heparin lock flush 100 unit/mL  500 Units Intravenous Once Brunetta Genera, MD      .  sodium chloride flush (NS) 0.9 % injection 10 mL  10 mL Intravenous Once Brunetta Genera, MD        REVIEW OF SYSTEMS:    10 Point review of Systems was done is negative except as noted above.  PHYSICAL EXAMINATION: ECOG PERFORMANCE STATUS: 1 - Symptomatic but completely ambulatory    BP 128/63 mmHg  Pulse 52  Temp(Src) 98.1 F (36.7 C) (Oral)  Resp 16  Wt 206 lb (93.441 kg)  SpO2 99%  . Filed Vitals:   04/17/16 1515  Weight: 206 lb (93.441 kg)   Filed Weights   04/17/16 1515   Weight: 206 lb (93.441 kg)   .Body mass index is 27.93 kg/(m^2).  GENERAL:alert, mild emotional distress, feeling somewhat overwhelmed with the treatment. SKIN: skin color, texture, turgor are normal, no rashes or significant lesions EYES: normal, conjunctiva are pink and non-injected, sclera clear OROPHARYNX:no exudate, no erythema and lips, buccal mucosa, and tongue normal , coated tongue.  No sinus pain or tenderness.  No significant pharyngeal erythema or exudates. NECK: supple, no JVD, thyroid normal size, non-tender, without nodularity LYMPH:  no palpable lymphadenopathy in the cervical, axillary or inguinal LUNGS: clear to auscultation with normal respiratory effort. HEART: regular rate & rhythm,  no murmurs and no lower extremity edema ABDOMEN: abdomen soft, non-tender, normoactive bowel sounds ,No palpable hepatosplenomegaly.   Musculoskeletal: no cyanosis of digits and no clubbing  PSYCH: alert & oriented x 3 with fluent speech NEURO: no focal motor/sensory deficits  LABORATORY DATA:  I have reviewed the data as listed  . CBC Latest Ref Rng 04/17/2016 03/19/2016 03/12/2016  WBC 4.0 - 10.3 10e3/uL 5.2 5.3 5.1  Hemoglobin 13.0 - 17.1 g/dL 13.8 14.5 13.8  Hematocrit 38.4 - 49.9 % 41.5 43.7 41.8  Platelets 140 - 400 10e3/uL 195 206 195    . CMP Latest Ref Rng 04/17/2016 02/12/2016 12/24/2015  Glucose 70 - 140 mg/dl 96 90 85  BUN 7.0 - 26.0 mg/dL 22.5 18.2 15.0  Creatinine 0.7 - 1.3 mg/dL 0.9 0.9 0.8  Sodium 136 - 145 mEq/L 140 142 140  Potassium 3.5 - 5.1 mEq/L 4.1 4.3 4.0  CO2 22 - 29 mEq/L 29 30(H) 27  Calcium 8.4 - 10.4 mg/dL 8.9 9.2 8.9  Total Protein 6.4 - 8.3 g/dL 6.7 6.7 6.5  Total Bilirubin 0.20 - 1.20 mg/dL <0.30 <0.30 <0.30  Alkaline Phos 40 - 150 U/L 62 60 70  AST 5 - 34 U/L 20 21 22   ALT 0 - 55 U/L 22 18 27    . Lab Results  Component Value Date   LDH 176 04/17/2016    RADIOGRAPHIC STUDIES: I have personally reviewed the radiological images as listed and  agreed with the findings in the report. NUCLEAR MEDICINE PET SKULL BASE TO THIGH 01/07/2016  TECHNIQUE: 9.6 mCi F-18 FDG was injected intravenously. Full-ring PET imaging was performed from the skull base to thigh after the radiotracer. CT data was obtained and used for attenuation correction and anatomic localization.  FASTING BLOOD GLUCOSE: Value: 77 mg/dl  COMPARISON: 08/22/2015  FINDINGS: NECK  No hypermetabolic lymph nodes in the neck.  CHEST  No hypermetabolic mediastinal or hilar nodes. No suspicious pulmonary nodules on the CT scan.  ABDOMEN/PELVIS  No abnormal hypermetabolic activity within the liver, pancreas, adrenal glands, or spleen. No hypermetabolic lymph nodes in the abdomen or pelvis. 2 mm nonobstructing calculus again noted in lower pole of right kidney. No evidence hydronephrosis.  SKELETON  No focal hypermetabolic activity to  suggest skeletal metastasis. Decreased in diffuse posttreatment bone marrow uptake noted compared to prior study.  IMPRESSION: No evidence of metabolically active lymphoma.   Electronically Signed  By: Earle Gell M.D.  On: 01/07/2016 16:24   ASSESSMENT & PLAN:   Patient is a 40 yo male admitted with   1) High risk Burkitt's lymphoma stage III with no CNS involvement. 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 Patient is status post 6 cycles of EPOCH-R and received 4 doses of intrathecal methotrexate for CNS prophylaxis. His LDH level is down from 1200s to normal  PET/CT 08/22/2015 after 2 cycles of treatment. Complete metabolic response. No residual hypermetabolic lymphoma. That PET CT scan performed 01/07/2016 after completion of planned treatment showed no evidence of residual hypermetabolic disease.  Patient has no clinical evidence of disease progression at this time. Labs are stable.  LDH drawn today WNL    Plan -Patient has been doing well and reports no clinical symptoms suggestive of disease recurrence at this time. -He appears to be gradually getting to a more comfortable place emotionally. -Labs are stable. -The indication for additional treatment of his Burkitt's lymphoma at this time. -He is working on getting increasingly more active and in better shape. -he is aware of the symptoms of lymphoma recurrence and has been counseled to call us immediately if any new questions or concerns arise   Return to care With Dr. Irene Limbo in 3 months with CBC, CMP, LDH  I spent 20 minutes counseling the patient face to face. The total time spent in the appointment was 20 minutes and more than 50% was on counseling and direct patient cares.    Sullivan Lone MD [place] AAHIVMS [facility] Hematology/Oncology Physician [place]  (Office):       [phone] (Work cell):  [phone] (Fax):           [phone]

## 2016-07-10 ENCOUNTER — Ambulatory Visit (HOSPITAL_BASED_OUTPATIENT_CLINIC_OR_DEPARTMENT_OTHER): Payer: BLUE CROSS/BLUE SHIELD | Admitting: Hematology

## 2016-07-10 ENCOUNTER — Encounter: Payer: Self-pay | Admitting: Hematology

## 2016-07-10 ENCOUNTER — Other Ambulatory Visit (HOSPITAL_BASED_OUTPATIENT_CLINIC_OR_DEPARTMENT_OTHER): Payer: BLUE CROSS/BLUE SHIELD

## 2016-07-10 VITALS — BP 126/62 | HR 68 | Temp 98.7°F | Resp 18 | Wt 212.7 lb

## 2016-07-10 DIAGNOSIS — C8370 Burkitt lymphoma, unspecified site: Secondary | ICD-10-CM

## 2016-07-10 DIAGNOSIS — C91A1 Mature B-cell leukemia Burkitt-type, in remission: Secondary | ICD-10-CM | POA: Diagnosis not present

## 2016-07-10 DIAGNOSIS — Z8572 Personal history of non-Hodgkin lymphomas: Secondary | ICD-10-CM

## 2016-07-10 DIAGNOSIS — C8378 Burkitt lymphoma, lymph nodes of multiple sites: Secondary | ICD-10-CM

## 2016-07-10 LAB — COMPREHENSIVE METABOLIC PANEL
ALBUMIN: 3.9 g/dL (ref 3.5–5.0)
ALK PHOS: 72 U/L (ref 40–150)
ALT: 20 U/L (ref 0–55)
ANION GAP: 7 meq/L (ref 3–11)
AST: 18 U/L (ref 5–34)
BUN: 13.5 mg/dL (ref 7.0–26.0)
CO2: 29 mEq/L (ref 22–29)
Calcium: 9.3 mg/dL (ref 8.4–10.4)
Chloride: 105 mEq/L (ref 98–109)
Creatinine: 1 mg/dL (ref 0.7–1.3)
GLUCOSE: 88 mg/dL (ref 70–140)
Potassium: 4.2 mEq/L (ref 3.5–5.1)
Sodium: 141 mEq/L (ref 136–145)
TOTAL PROTEIN: 6.6 g/dL (ref 6.4–8.3)

## 2016-07-10 LAB — CBC & DIFF AND RETIC
BASO%: 0.6 % (ref 0.0–2.0)
BASOS ABS: 0 10*3/uL (ref 0.0–0.1)
EOS%: 2.1 % (ref 0.0–7.0)
Eosinophils Absolute: 0.1 10*3/uL (ref 0.0–0.5)
HEMATOCRIT: 40.5 % (ref 38.4–49.9)
HEMOGLOBIN: 13.8 g/dL (ref 13.0–17.1)
Immature Retic Fract: 1.9 % — ABNORMAL LOW (ref 3.00–10.60)
LYMPH%: 23.7 % (ref 14.0–49.0)
MCH: 29.5 pg (ref 27.2–33.4)
MCHC: 34.1 g/dL (ref 32.0–36.0)
MCV: 86.5 fL (ref 79.3–98.0)
MONO#: 0.5 10*3/uL (ref 0.1–0.9)
MONO%: 9.1 % (ref 0.0–14.0)
NEUT#: 3.4 10*3/uL (ref 1.5–6.5)
NEUT%: 64.5 % (ref 39.0–75.0)
PLATELETS: 181 10*3/uL (ref 140–400)
RBC: 4.68 10*6/uL (ref 4.20–5.82)
RDW: 12.6 % (ref 11.0–14.6)
Retic %: 1.2 % (ref 0.80–1.80)
Retic Ct Abs: 56.16 10*3/uL (ref 34.80–93.90)
WBC: 5.2 10*3/uL (ref 4.0–10.3)
lymph#: 1.2 10*3/uL (ref 0.9–3.3)
nRBC: 0 % (ref 0–0)

## 2016-07-10 LAB — LACTATE DEHYDROGENASE: LDH: 186 U/L (ref 125–245)

## 2016-07-13 ENCOUNTER — Telehealth: Payer: Self-pay | Admitting: Hematology

## 2016-07-13 NOTE — Telephone Encounter (Signed)
Lvm advising appt 11/30 @ 3.30pm.

## 2016-07-17 NOTE — Progress Notes (Signed)
Donald [name]    HEMATOLOGY/ONCOLOGY CLINIC NOTE  Date of Service: 07/10/2016   Patient Care Team: Doe-Hyun Donald Searles, DO as PCP - General (Internal Medicine)  CHIEF COMPLAINTS/PURPOSE OF CONSULTATION:  Posthospitalization follow-up for Burkitt's lymphoma  Diagnosis: Stage III Burkitt's lymphoma without CNS involvement diagnosed in September 2016.  TREATMENT EPOCH-R + CNS prophylaxis as per NEJM 2013 Dunleavy et al. S/p 6 cycles of EPOCH-R completed 12/17/2015.  s/p 4 doses of intrathecal methotrexate plus hydrocortisone for CNS prophylaxis  INTERVAL HISTORY  [name] is is here for her scheduled 3 month clinic follow-up for his Burkitt's lymphoma.  He is now one year out from his initial diagnosis and about 7 months out after completing his planned treatment. He notes no acute new symptoms. No fever and no chills no night sweats no loss. Reports that he is gaining weight. He is trying to spend more time with his family and children. 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 He has not noted any new enlarged  MEDICAL HISTORY:   Past Medical History:  Diagnosis Date  . Burkitt lymphoma ([place]) 08/15/2015  . Complication of anesthesia   . EBV infection 2013   Patient notes that he had an EBV infection in 2013 that left him with chronic fatigue. She also notes that he had significant lymphadenopathy and thyroiditis at the time. He has been managing his symptoms with an alternative medicine practitioner in [place] and with other alternative medicines.  . Marijuana use, episodic     SURGICAL HISTORY: Past Surgical History:  Procedure Laterality Date  . APPENDECTOMY    . LAPAROSCOPY N/A 07/20/2015   Procedure: LAPAROSCOPY DIAGNOSTIC, MULTIPLE BIOPSIES, DRAINAGE OF ASCITES;  Surgeon: Fanny Skates, MD;  Location: [facility];  Service: General;  Laterality: N/A;  . TONSILLECTOMY      SOCIAL HISTORY: Social History   Social History  . Marital status: Married    Spouse  name: N/A  . Number of children: N/A  . Years of education: N/A   Occupational History  . Not on file.   Social History Main Topics  . Smoking status: Never Smoker  . Smokeless tobacco: Never Used  . Alcohol use No  . Drug use:     Frequency: 1.0 time per week    Types: Marijuana  . Sexual activity: Yes   Other Topics Concern  . 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   He has a fiance and has been in a steady relationship.      He has tried to maintain a very healthy lifestyle and cycles about 100 miles a week. He also uses a fair number of over-the-counter alternative medicines to stay healthy.    FAMILY HISTORY: Family History  Problem Relation Age of Onset  . Heart failure Father     ALLERGIES:  is allergic to levaquin [levofloxacin] and sulfa antibiotics.  MEDICATIONS:  Current Outpatient Prescriptions  Medication Sig Dispense Refill  . HYDROcodone-acetaminophen (NORCO) 7.5-325 MG tablet Take 1-2 tablets by mouth every 6 (six) hours as needed for severe pain. 60 tablet 0  . Nutritional Supplements (DHEA PO) Take by mouth daily.    . Pregnenolone POWD Take by mouth daily.     No current facility-administered medications for this visit.    Facility-Administered Medications Ordered in Other Visits  Medication Dose Route Frequency Provider Last Rate Last Dose  . heparin  lock flush 100 unit/mL  500 Units Intravenous Once Brunetta Genera, MD      . sodium chloride flush (NS) 0.9 % injection 10 mL  10 mL Intravenous Once Brunetta Genera, MD        REVIEW OF SYSTEMS:    10 Point review of Systems was done is negative except as noted above.  PHYSICAL EXAMINATION: ECOG PERFORMANCE STATUS: 1 - Symptomatic but completely ambulatory    BP 126/62 (BP Location: Left Arm, Patient Position: Sitting)   Pulse 68   Temp 98.7 F (37.1 C) (Oral)   Resp 18   Wt 212 lb  11.2 oz (96.5 kg)   SpO2 100%   BMI 28.85 kg/m   . Vitals:   07/10/16 1553  Weight: 212 lb 11.2 oz (96.5 kg)   Filed Weights   07/10/16 1553  Weight: 212 lb 11.2 oz (96.5 kg)   .Body mass index is 28.85 kg/m.  GENERAL:alert, mild emotional distress, feeling somewhat overwhelmed with the treatment. SKIN: skin color, texture, turgor are normal, no rashes or significant lesions EYES: normal, conjunctiva are pink and non-injected, sclera clear OROPHARYNX:no exudate, no erythema and lips, buccal mucosa, and tongue normal , coated tongue.  No sinus pain or tenderness.  No significant pharyngeal erythema or exudates. NECK: supple, no JVD, thyroid normal size, non-tender, without nodularity LYMPH:  no palpable lymphadenopathy in the cervical, axillary or inguinal LUNGS: clear to auscultation with normal respiratory effort. HEART: regular rate & rhythm,  no murmurs and no lower extremity edema ABDOMEN: abdomen soft, non-tender, normoactive bowel sounds ,No palpable hepatosplenomegaly.   Musculoskeletal: no cyanosis of digits and no clubbing  PSYCH: alert & oriented x 3 with fluent speech NEURO: no focal motor/sensory deficits  LABORATORY DATA:  I have reviewed the data as listed  . CBC Latest Ref Rng & Units 07/10/2016 04/17/2016 03/19/2016  WBC 4.0 - 10.3 10e3/uL 5.2 5.2 5.3  Hemoglobin 13.0 - 17.1 g/dL 13.8 13.8 14.5  Hematocrit 38.4 - 49.9 % 40.5 41.5 43.7  Platelets 140 - 400 10e3/uL 181 195 206    . CMP Latest Ref Rng & Units 07/10/2016 04/17/2016 02/12/2016  Glucose 70 - 140 mg/dl 88 96 90  BUN 7.0 - 26.0 mg/dL 13.5 22.5 18.2  Creatinine 0.7 - 1.3 mg/dL 1.0 0.9 0.9  Sodium 136 - 145 mEq/L 141 140 142  Potassium 3.5 - 5.1 mEq/L 4.2 4.1 4.3  Chloride 101 - 111 mmol/L - - -  CO2 22 - 29 mEq/L 29 29 30(H)  Calcium 8.4 - 10.4 mg/dL 9.3 8.9 9.2  Total Protein 6.4 - 8.3 g/dL 6.6 6.7 6.7  Total Bilirubin 0.20 - 1.20 mg/dL <0.30 <0.30 <0.30  Alkaline Phos 40 - 150 U/L 72 62 60  AST 5  - 34 U/L 18 20 21   ALT 0 - 55 U/L 20 22 18    . Lab Results  Component Value Date   LDH 186 07/10/2016    RADIOGRAPHIC STUDIES: No new imaging  ASSESSMENT & PLAN:   Patient is a 40 yo male admitted with   1) High risk Burkitt's lymphoma stage III with no CNS involvement. 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 Patient is status post 6 cycles of EPOCH-R and received 4 doses of intrathecal methotrexate for CNS prophylaxis. That PET CT scan performed 01/07/2016 after completion of planned treatment showed no evidence of residual hypermetabolic disease.  On clinic visit today  patient has  no clinical evidence of disease progression at this time. Labs are stable.  LDH drawn today WNL   Plan -Patient has been doing well and reports no clinical symptoms suggestive of disease recurrence at this time. -no indication for additional treatment of his Burkitt's lymphoma at this time. -He was encouraged to maintain an active and healthy lifestyle.  -he is aware of the symptoms of lymphoma recurrence and has been counseled to call us immediately if any new questions or concerns arise   Return to care With Dr. Irene Limbo in 3 months with CBC, CMP, LDH  I spent 20 minutes counseling the patient face to face. The total time spent in the appointment was 20 minutes and more than 50% was on counseling and direct patient cares.    Sullivan Lone MD [place] AAHIVMS [facility] Hematology/Oncology Physician [place]  (Office):       [phone] (Work cell):  [phone] (Fax):           [phone]

## 2016-08-08 ENCOUNTER — Encounter (HOSPITAL_COMMUNITY): Payer: Self-pay | Admitting: Oncology

## 2016-08-08 ENCOUNTER — Emergency Department (HOSPITAL_COMMUNITY)
Admission: EM | Admit: 2016-08-08 | Discharge: 2016-08-09 | Disposition: A | Discharge status: Home / Self Care | Payer: BLUE CROSS/BLUE SHIELD | Attending: Emergency Medicine | Admitting: Emergency Medicine

## 2016-08-08 DIAGNOSIS — K5669 Other intestinal obstruction: Secondary | ICD-10-CM | POA: Diagnosis not present

## 2016-08-08 DIAGNOSIS — K56600 Partial intestinal obstruction, unspecified as to cause: Secondary | ICD-10-CM

## 2016-08-08 DIAGNOSIS — R1084 Generalized abdominal pain: Secondary | ICD-10-CM | POA: Diagnosis present

## 2016-08-08 HISTORY — DX: Unspecified intestinal obstruction, unspecified as to partial versus complete obstruction: K56.609

## 2016-08-08 LAB — COMPREHENSIVE METABOLIC PANEL
ALBUMIN: 4.6 g/dL (ref 3.5–5.0)
ALK PHOS: 57 U/L (ref 38–126)
ALT: 26 U/L (ref 17–63)
AST: 22 U/L (ref 15–41)
Anion gap: 7 (ref 5–15)
BILIRUBIN TOTAL: 0.9 mg/dL (ref 0.3–1.2)
BUN: 19 mg/dL (ref 6–20)
CALCIUM: 9 mg/dL (ref 8.9–10.3)
CO2: 28 mmol/L (ref 22–32)
CREATININE: 0.82 mg/dL (ref 0.61–1.24)
Chloride: 103 mmol/L (ref 101–111)
GFR calc Af Amer: >60 mL/min (ref >60)
GFR calc non Af Amer: >60 mL/min (ref >60)
GLUCOSE: 103 mg/dL — AB (ref 65–99)
POTASSIUM: 4 mmol/L (ref 3.5–5.1)
Sodium: 138 mmol/L (ref 135–145)
TOTAL PROTEIN: 6.9 g/dL (ref 6.5–8.1)

## 2016-08-08 LAB — CBC
HEMATOCRIT: 44.6 % (ref 39.0–52.0)
Hemoglobin: 15.3 g/dL (ref 13.0–17.0)
MCH: 30 pg (ref 26.0–34.0)
MCHC: 34.3 g/dL (ref 30.0–36.0)
MCV: 87.5 fL (ref 78.0–100.0)
PLATELETS: 197 10*3/uL (ref 150–400)
RBC: 5.1 MIL/uL (ref 4.22–5.81)
RDW: 12.3 % (ref 11.5–15.5)
WBC: 12.3 10*3/uL — ABNORMAL HIGH (ref 4.0–10.5)

## 2016-08-08 LAB — LIPASE, BLOOD: Lipase: 19 U/L (ref 11–51)

## 2016-08-08 NOTE — ED Triage Notes (Signed)
Pt c/o generalized abdominal pain.  States he has had a SBO x 2 and this pain is similar.  Pt rates pain 10/10.  Hypoactive VS.  Abdopmen is distended.

## 2016-08-08 NOTE — ED Provider Notes (Signed)
[facility] DEPT Provider Note   CSN: [id number] Arrival date & time: 08/08/16  2151   By signing my name below, I, Maud Deed. Royston Sinner, attest that this documentation has been prepared under the direction and in the presence of Davonna Belling, MD.  Electronically Signed: Maud Deed. Royston Sinner, ED Scribe. 08/09/16. [name]   History   Chief Complaint Chief Complaint  Patient presents with  . Abdominal Pain   The history is provided by the patient. No language interpreter was used.    HPI Comments: Donald [name] is a 40 y.o. male with a PMHx of Burkitt Lymphoma and small bowel obstruction who presents to the Emergency Department complaining of constant, worsening generalized abdominal pain onset 4:00-5:00 PM this evening. Discomfort to abdomen made worse with palpation and certain positioning. He also reports associated nausea, vomiting, and dry heaving. No OTC medications or home remedies attempted prior to arrival. No recent fever or chills. Pt states he was able to pass gas as normal earlier but denies passing any gas in the last several hours.   PCP: Drema Pry, DO    Past Medical History:  Diagnosis Date  . Burkitt lymphoma ([place]) 08/15/2015  . Complication of anesthesia   . EBV infection 2013   Patient notes that he had an EBV infection in 2013 that left him with chronic fatigue. She also notes that he had significant lymphadenopathy and thyroiditis at the time. He has been managing his symptoms with an alternative medicine practitioner in [place] and with other alternative medicines.  . Marijuana use, episodic   . SBO (small bowel obstruction) [facility])     Patient Active Problem List   Diagnosis Date Noted  . Chemotherapy induced nausea and vomiting   . Burkitt lymphoma of intra-abdominal lymph nodes ([place])   . Burkitt's lymphoma of lymph nodes of multiple regions ([place]) 12/03/2015  . Acute sinusitis 11/20/2015  . Generalized abdominal pain   . Leukocytosis 10/23/2015  . SBO (small  bowel obstruction) ([place])   . Abdominal pain   . Dehydration   . Oral thrush   . Encounter for chemotherapy management   . Gastritis and gastroduodenitis   . Cephalalgia   . Uncontrollable vomiting   . Constipation   . Cerebrospinal fluid leak from spinal puncture   . Spinal headache   . Burkitt lymphoma ([place]) 08/15/2015  . Burkitt lymphoma of lymph nodes of multiple regions ([place])   . Chemotherapy management, encounter for   . CN (constipation)   . Dyspnea   . Encounter for antineoplastic chemotherapy 07/25/2015  . Burkitt's lymphoma ([place]) 07/24/2015  . Acute respiratory failure with hypoxemia ([place])   . Acute respiratory failure with hypoxia ([place]) 07/20/2015  . ARF (acute renal failure) ([place])   . Elevated LFTs   . Lymphoma ([place])   . Abdominal distension   . AKI (acute kidney injury) ([place])   . Ascites   . Mass   . Spontaneous tumor lysis syndrome   . Pleural effusion 07/15/2015  . Pleural effusion, right 07/14/2015  . Chronic fatigue syndrome 09/23/2012    Past Surgical History:  Procedure Laterality Date  . APPENDECTOMY    . LAPAROSCOPY N/A 07/20/2015   Procedure: LAPAROSCOPY DIAGNOSTIC, MULTIPLE BIOPSIES, DRAINAGE OF ASCITES;  Surgeon: Fanny Skates, MD;  Location: [facility];  Service: General;  Laterality: N/A;  . TONSILLECTOMY         Home Medications    Prior to Admission medications   Medication Sig Start Date End Date Taking? Authorizing Provider  HYDROcodone-acetaminophen (NORCO) 7.5-325 MG tablet Take 1-2 tablets by mouth every 6 (six) hours as needed for severe pain. Patient not taking: Reported on 08/08/2016 12/17/15   Brunetta Genera, MD    Family History Family History  Problem Relation Age of Onset  . Heart failure Father     Social History Social History  Substance Use Topics  . Smoking status: Never Smoker  . Smokeless tobacco: Never Used  . Alcohol use No     Allergies   Levaquin [levofloxacin] and Sulfa antibiotics   Review of  Systems Review of Systems  Constitutional: Negative for chills and fever.  Respiratory: Negative for cough.   Gastrointestinal: Positive for abdominal pain, nausea and vomiting.  All other systems reviewed and are negative.    Physical Exam Updated Vital Signs BP (!) 155/108 (BP Location: Right Arm)   Pulse 74   Temp 97.7 F (36.5 C) (Oral)   Resp 20   Ht 5\' 11"  (1.803 m)   Wt 200 lb (90.7 kg)   SpO2 99%   BMI 27.89 kg/m   Physical Exam  Constitutional: He is oriented to person, place, and time. He appears well-developed and well-nourished.  Pt appears uncomfortable. Moaning on exam.  HENT:  Head: Normocephalic and atraumatic.  Eyes: EOM are normal.  Neck: Normal range of motion.  Cardiovascular: Normal rate, regular rhythm, normal heart sounds and intact distal pulses.   Pulmonary/Chest: Effort normal and breath sounds normal. No respiratory distress.  Abdominal: He exhibits distension (Mild). There is tenderness.  Tenderness to palpation over R upper abdomen. No hernia noted.  Musculoskeletal: Normal range of motion.  Neurological: He is alert and oriented to person, place, and time.  Skin: Skin is warm and dry.  Psychiatric: He has a normal mood and affect. Judgment normal.  Nursing note and vitals reviewed.    ED Treatments / Results   DIAGNOSTIC STUDIES: Oxygen Saturation is 99% on RA, Normal by my interpretation.    COORDINATION OF CARE: 12:44 AM- Will give fluids, Zofran, and Sublimaze. Will order blood work, urinalysis, and imaging. Discussed treatment plan with pt at bedside and pt agreed to plan.     Labs (all labs ordered are listed, but only abnormal results are displayed) Labs Reviewed  COMPREHENSIVE METABOLIC PANEL - Abnormal; Notable for the following:       Result Value   Glucose, Bld 103 (*)    All other components within normal limits  CBC - Abnormal; Notable for the following:    WBC 12.3 (*)    All other components within normal limits    LIPASE, BLOOD  URINALYSIS, ROUTINE W REFLEX MICROSCOPIC (NOT AT [facility])    EKG  EKG Interpretation None       Radiology No results found.  Procedures Procedures (including critical care time)  Medications Ordered in ED Medications - No data to display   Initial Impression / Assessment and Plan / ED Course  I have reviewed the triage vital signs and the nursing notes.  Pertinent labs & imaging results that were available during my care of the patient were reviewed by me and considered in my medical decision making (see chart for details).  Clinical Course    Patient with nausea and vomiting. States felt like previous small bowel obstruction. CT scan shows possible partial small bowel obstruction. Has transition point that with the history is likely at the spot of his previous lymphoma. Patient feels much better after the CT scan. He's tolerated the contrast.  States he really does not want to be admitted to the hospitalist. He has been passing gas. At this point I think it is reasonable for him to give a trial at home. He has previous experience with dealing with the small bowel obstructions. Will discharge home. Will return if symptoms return or he feels worse.  Final Clinical Impressions(s) / ED Diagnoses   Final diagnoses:  None    New Prescriptions New Prescriptions   No medications on file   I personally performed the services described in this documentation, which was scribed in my presence. The recorded information has been reviewed and is accurate.      Davonna Belling, MD 08/09/16 [phone]

## 2016-08-09 ENCOUNTER — Emergency Department (HOSPITAL_COMMUNITY): Payer: BLUE CROSS/BLUE SHIELD

## 2016-08-09 ENCOUNTER — Encounter (HOSPITAL_COMMUNITY): Payer: Self-pay | Admitting: Radiology

## 2016-08-09 LAB — URINALYSIS, ROUTINE W REFLEX MICROSCOPIC
BILIRUBIN URINE: NEGATIVE
Glucose, UA: NEGATIVE mg/dL
Hgb urine dipstick: NEGATIVE
KETONES UR: NEGATIVE mg/dL
Leukocytes, UA: NEGATIVE
NITRITE: NEGATIVE
PROTEIN: NEGATIVE mg/dL
Specific Gravity, Urine: >1.046 — ABNORMAL HIGH (ref 1.005–1.030)
pH: 7 (ref 5.0–8.0)

## 2016-08-09 MED ORDER — ONDANSETRON 8 MG PO TBDP: 8.0000 mg | ORAL_TABLET | Freq: Three times a day (TID) | ORAL | 0 refills | Status: DC | PRN

## 2016-08-09 MED ORDER — IOPAMIDOL (ISOVUE-300) INJECTION 61%
100.0000 mL | Freq: Once | INTRAVENOUS | Status: AC | PRN
  Administered 2016-08-09: 100 mL via INTRAVENOUS

## 2016-08-09 MED ORDER — ONDANSETRON HCL 4 MG/2ML IJ SOLN
4.0000 mg | Freq: Once | INTRAMUSCULAR | Status: AC
  Administered 2016-08-09: 4 mg via INTRAVENOUS
  Filled 2016-08-09: qty 2

## 2016-08-09 MED ORDER — SODIUM CHLORIDE 0.9 % IV BOLUS (SEPSIS)
1000.0000 mL | Freq: Once | INTRAVENOUS | Status: AC
  Administered 2016-08-09: 1000 mL via INTRAVENOUS

## 2016-08-09 MED ORDER — FENTANYL CITRATE (PF) 100 MCG/2ML IJ SOLN
100.0000 ug | Freq: Once | INTRAMUSCULAR | Status: AC
  Administered 2016-08-09: 100 ug via INTRAVENOUS
  Filled 2016-08-09: qty 2

## 2016-08-09 NOTE — ED Notes (Signed)
Patient transported to CT 

## 2016-08-09 NOTE — ED Notes (Signed)
Pt. Reminded of urinalysis, verbalized understanding.

## 2016-10-09 ENCOUNTER — Ambulatory Visit (HOSPITAL_BASED_OUTPATIENT_CLINIC_OR_DEPARTMENT_OTHER): Payer: BLUE CROSS/BLUE SHIELD | Admitting: Hematology

## 2016-10-09 ENCOUNTER — Other Ambulatory Visit (HOSPITAL_BASED_OUTPATIENT_CLINIC_OR_DEPARTMENT_OTHER): Payer: BLUE CROSS/BLUE SHIELD

## 2016-10-09 ENCOUNTER — Encounter: Payer: Self-pay | Admitting: Hematology

## 2016-10-09 DIAGNOSIS — C8379 Burkitt lymphoma, extranodal and solid organ sites: Secondary | ICD-10-CM

## 2016-10-09 DIAGNOSIS — C91A1 Mature B-cell leukemia Burkitt-type, in remission: Secondary | ICD-10-CM

## 2016-10-09 LAB — CBC & DIFF AND RETIC
BASO%: 0.4 % (ref 0.0–2.0)
BASOS ABS: 0 10*3/uL (ref 0.0–0.1)
EOS%: 1.8 % (ref 0.0–7.0)
Eosinophils Absolute: 0.1 10*3/uL (ref 0.0–0.5)
HEMATOCRIT: 42.4 % (ref 38.4–49.9)
HGB: 14.4 g/dL (ref 13.0–17.1)
Immature Retic Fract: 2.9 % — ABNORMAL LOW (ref 3.00–10.60)
LYMPH%: 21.7 % (ref 14.0–49.0)
MCH: 29.4 pg (ref 27.2–33.4)
MCHC: 34 g/dL (ref 32.0–36.0)
MCV: 86.5 fL (ref 79.3–98.0)
MONO#: 0.6 10*3/uL (ref 0.1–0.9)
MONO%: 8.6 % (ref 0.0–14.0)
NEUT#: 4.6 10*3/uL (ref 1.5–6.5)
NEUT%: 67.5 % (ref 39.0–75.0)
PLATELETS: 204 10*3/uL (ref 140–400)
RBC: 4.9 10*6/uL (ref 4.20–5.82)
RDW: 12.3 % (ref 11.0–14.6)
RETIC CT ABS: 57.82 10*3/uL (ref 34.80–93.90)
Retic %: 1.18 % (ref 0.80–1.80)
WBC: 6.8 10*3/uL (ref 4.0–10.3)
lymph#: 1.5 10*3/uL (ref 0.9–3.3)

## 2016-10-09 LAB — COMPREHENSIVE METABOLIC PANEL
ALT: 30 U/L (ref 0–55)
ANION GAP: 9 meq/L (ref 3–11)
AST: 20 U/L (ref 5–34)
Albumin: 4.1 g/dL (ref 3.5–5.0)
Alkaline Phosphatase: 76 U/L (ref 40–150)
BUN: 17 mg/dL (ref 7.0–26.0)
CALCIUM: 9.3 mg/dL (ref 8.4–10.4)
CHLORIDE: 104 meq/L (ref 98–109)
CO2: 27 mEq/L (ref 22–29)
CREATININE: 0.9 mg/dL (ref 0.7–1.3)
Glucose: 106 mg/dl (ref 70–140)
Potassium: 3.8 mEq/L (ref 3.5–5.1)
Sodium: 140 mEq/L (ref 136–145)
Total Bilirubin: 0.38 mg/dL (ref 0.20–1.20)
Total Protein: 7.2 g/dL (ref 6.4–8.3)

## 2016-10-09 LAB — LACTATE DEHYDROGENASE: LDH: 188 U/L (ref 125–245)

## 2016-10-10 NOTE — Progress Notes (Signed)
[name]    HEMATOLOGY/ONCOLOGY CLINIC NOTE  Date of Service: 10/09/2016   Patient Care Team: [name], DO as PCP - General (Internal Medicine)  CHIEF COMPLAINTS/PURPOSE OF CONSULTATION:  Posthospitalization follow-up for Burkitt's lymphoma  Diagnosis: Stage III Burkitt's lymphoma without CNS involvement diagnosed in September 2016.  TREATMENT EPOCH-R + CNS prophylaxis as per NEJM 2013 Dunleavy et al. S/p 6 cycles of EPOCH-R completed 12/17/2015.  s/p 4 doses of intrathecal methotrexate plus hydrocortisone for CNS prophylaxis  INTERVAL HISTORY  [name] is is here for her scheduled 3 month clinic follow-up for his Burkitt's lymphoma.  He is now about 10 months out after completing his planned treatment. He notes no acute new symptoms. No fever and no chills no night sweats no loss.  No chest pain no shortness of breath no abdominal pain. No palpable new enlarged lymph nodes. No other acute new focal symptoms.   MEDICAL HISTORY:   Past Medical History:  Diagnosis Date  . Burkitt lymphoma ([place]) 08/15/2015  . Complication of anesthesia   . EBV infection 2013   Patient notes that he had an EBV infection in 2013 that left him with chronic fatigue. She also notes that he had significant lymphadenopathy and thyroiditis at the time. He has been managing his symptoms with an alternative medicine practitioner in [name] and with other alternative medicines.  . Marijuana use, episodic   . SBO (small bowel obstruction)     SURGICAL HISTORY: Past Surgical History:  Procedure Laterality Date  . APPENDECTOMY    . LAPAROSCOPY N/A 07/20/2015   Procedure: LAPAROSCOPY DIAGNOSTIC, MULTIPLE BIOPSIES, DRAINAGE OF ASCITES;  Surgeon: Fanny Skates, MD;  Location: [facility];  Service: General;  Laterality: N/A;  . TONSILLECTOMY      SOCIAL HISTORY: Social History   Social History  . Marital status: Married    Spouse name: N/A  . Number of children: N/A  . Years of education:  N/A   Occupational History  . Not on file.   Social History Main Topics  . Smoking status: Never Smoker  . Smokeless tobacco: Never Used  . Alcohol use No  . Drug use:     Frequency: 1.0 time per week    Types: Marijuana  . Sexual activity: Yes   Other Topics Concern  . 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   He has a fiance and has been in a steady relationship.      He has tried to maintain a very healthy lifestyle and cycles about 100 miles a week. He also uses a fair number of over-the-counter alternative medicines to stay healthy.    FAMILY HISTORY: Family History  Problem Relation Age of Onset  . Heart failure Father     ALLERGIES:  is allergic to levaquin [levofloxacin] and sulfa antibiotics.  MEDICATIONS:  No current outpatient prescriptions on file.   No current facility-administered medications for this visit.    Facility-Administered Medications Ordered in Other Visits  Medication Dose Route Frequency Provider Last Rate Last Dose  . heparin lock flush 100 unit/mL  500 Units Intravenous Once Brunetta Genera, MD      . sodium chloride flush (NS) 0.9 % injection 10 mL  10 mL Intravenous Once Maureen Duesing Juleen China, MD        REVIEW OF SYSTEMS:    10 Point review of Systems was done is negative except  as noted above.  PHYSICAL EXAMINATION: ECOG PERFORMANCE STATUS:0 VS reviewed and are stable  GENERAL:alert, mild emotional distress, feeling somewhat overwhelmed with the treatment. SKIN: skin color, texture, turgor are normal, no rashes or significant lesions EYES: normal, conjunctiva are pink and non-injected, sclera clear OROPHARYNX:no exudate, no erythema and lips, buccal mucosa, and tongue normal , coated tongue.  No sinus pain or tenderness.  No significant pharyngeal erythema or exudates. NECK: supple, no JVD, thyroid normal size, non-tender, without  nodularity LYMPH:  no palpable lymphadenopathy in the cervical, axillary or inguinal LUNGS: clear to auscultation with normal respiratory effort. HEART: regular rate & rhythm,  no murmurs and no lower extremity edema ABDOMEN: abdomen soft, non-tender, normoactive bowel sounds ,No palpable hepatosplenomegaly.   Musculoskeletal: no cyanosis of digits and no clubbing  PSYCH: alert & oriented x 3 with fluent speech NEURO: no focal motor/sensory deficits  LABORATORY DATA:  I have reviewed the data as listed  . CBC Latest Ref Rng & Units 10/09/2016 08/08/2016 07/10/2016  WBC 4.0 - 10.3 10e3/uL 6.8 12.3(H) 5.2  Hemoglobin 13.0 - 17.1 g/dL 14.4 15.3 13.8  Hematocrit 38.4 - 49.9 % 42.4 44.6 40.5  Platelets 140 - 400 10e3/uL 204 197 181    . CMP Latest Ref Rng & Units 10/09/2016 08/08/2016 07/10/2016  Glucose 70 - 140 mg/dl 106 103(H) 88  BUN 7.0 - 26.0 mg/dL 17.0 19 13.5  Creatinine 0.7 - 1.3 mg/dL 0.9 0.82 1.0  Sodium 136 - 145 mEq/L 140 138 141  Potassium 3.5 - 5.1 mEq/L 3.8 4.0 4.2  Chloride 101 - 111 mmol/L - 103 -  CO2 22 - 29 mEq/L 27 28 29   Calcium 8.4 - 10.4 mg/dL 9.3 9.0 9.3  Total Protein 6.4 - 8.3 g/dL 7.2 6.9 6.6  Total Bilirubin 0.20 - 1.20 mg/dL 0.38 0.9 <0.30  Alkaline Phos 40 - 150 U/L 76 57 72  AST 5 - 34 U/L 20 22 18   ALT 0 - 55 U/L 30 26 20    . Lab Results  Component Value Date   LDH 188 10/09/2016    RADIOGRAPHIC STUDIES: No new imaging  ASSESSMENT & PLAN:   Patient is a 40 yo male admitted with   1) High risk Burkitt's lymphoma stage III with no CNS involvement. 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 Patient is status post 6 cycles of EPOCH-R and received 4 doses of intrathecal methotrexate for CNS prophylaxis. That PET CT scan performed 01/07/2016 after completion of planned treatment showed no evidence of residual hypermetabolic disease.  On clinic visit today  patient has  no clinical evidence of disease progression at this time. Labs are stable.  LDH drawn today WNL  He is now out about 10 months since completion of treatment. Plan -Patient has been doing well and reports no clinical symptoms suggestive of disease recurrence at this time. -no indication for additional treatment of his Burkitt's lymphoma at this time. -He was encouraged to maintain an active and healthy lifestyle.  -he is aware of the symptoms of lymphoma recurrence and has been counseled to call us immediately if any new questions or concerns arise   Return to care With Dr. Irene Limbo in 3 months with CBC, CMP, LDH  I spent 20 minutes counseling the patient face to face. The total time spent in the appointment was 20 minutes and more than 50% was on counseling and direct patient cares.    Sullivan Lone MD  [place] AAHIVMS [facility] Hematology/Oncology Physician [place]  (Office):       [phone] (Work cell):  [phone] (Fax):           [phone]

## 2016-10-20 ENCOUNTER — Telehealth: Payer: Self-pay | Admitting: General Practice

## 2016-10-20 NOTE — Telephone Encounter (Signed)
Spoke with pt confirmed Feb 2018 appts.

## 2017-01-05 ENCOUNTER — Other Ambulatory Visit: Payer: Self-pay | Admitting: *Deleted

## 2017-01-05 ENCOUNTER — Other Ambulatory Visit (HOSPITAL_BASED_OUTPATIENT_CLINIC_OR_DEPARTMENT_OTHER): Payer: BLUE CROSS/BLUE SHIELD

## 2017-01-05 ENCOUNTER — Telehealth: Payer: Self-pay | Admitting: *Deleted

## 2017-01-05 ENCOUNTER — Ambulatory Visit (HOSPITAL_BASED_OUTPATIENT_CLINIC_OR_DEPARTMENT_OTHER): Payer: BLUE CROSS/BLUE SHIELD | Admitting: Hematology

## 2017-01-05 ENCOUNTER — Encounter: Payer: Self-pay | Admitting: Hematology

## 2017-01-05 VITALS — BP 127/81 | HR 54 | Temp 98.2°F | Resp 18

## 2017-01-05 DIAGNOSIS — Z8572 Personal history of non-Hodgkin lymphomas: Secondary | ICD-10-CM

## 2017-01-05 DIAGNOSIS — C8590 Non-Hodgkin lymphoma, unspecified, unspecified site: Secondary | ICD-10-CM

## 2017-01-05 DIAGNOSIS — C91A1 Mature B-cell leukemia Burkitt-type, in remission: Secondary | ICD-10-CM

## 2017-01-05 DIAGNOSIS — R197 Diarrhea, unspecified: Secondary | ICD-10-CM | POA: Diagnosis not present

## 2017-01-05 LAB — COMPREHENSIVE METABOLIC PANEL
ALT: 48 U/L (ref 0–55)
ANION GAP: 8 meq/L (ref 3–11)
AST: 32 U/L (ref 5–34)
Albumin: 4.2 g/dL (ref 3.5–5.0)
Alkaline Phosphatase: 67 U/L (ref 40–150)
BUN: 16.3 mg/dL (ref 7.0–26.0)
CALCIUM: 9.1 mg/dL (ref 8.4–10.4)
CHLORIDE: 104 meq/L (ref 98–109)
CO2: 27 mEq/L (ref 22–29)
CREATININE: 1.1 mg/dL (ref 0.7–1.3)
EGFR: 86 mL/min/{1.73_m2} — ABNORMAL LOW (ref >90)
Glucose: 96 mg/dl (ref 70–140)
POTASSIUM: 4.1 meq/L (ref 3.5–5.1)
Sodium: 139 mEq/L (ref 136–145)
Total Bilirubin: 0.41 mg/dL (ref 0.20–1.20)
Total Protein: 7 g/dL (ref 6.4–8.3)

## 2017-01-05 LAB — CBC & DIFF AND RETIC
BASO%: 0.4 % (ref 0.0–2.0)
BASOS ABS: 0 10*3/uL (ref 0.0–0.1)
EOS%: 2.2 % (ref 0.0–7.0)
Eosinophils Absolute: 0.1 10*3/uL (ref 0.0–0.5)
HEMATOCRIT: 44.7 % (ref 38.4–49.9)
HGB: 15 g/dL (ref 13.0–17.1)
Immature Retic Fract: 2.8 % — ABNORMAL LOW (ref 3.00–10.60)
LYMPH%: 24.7 % (ref 14.0–49.0)
MCH: 29.5 pg (ref 27.2–33.4)
MCHC: 33.6 g/dL (ref 32.0–36.0)
MCV: 88 fL (ref 79.3–98.0)
MONO#: 0.7 10*3/uL (ref 0.1–0.9)
MONO%: 15.5 % — AB (ref 0.0–14.0)
NEUT#: 2.6 10*3/uL (ref 1.5–6.5)
NEUT%: 57.2 % (ref 39.0–75.0)
PLATELETS: 178 10*3/uL (ref 140–400)
RBC: 5.08 10*6/uL (ref 4.20–5.82)
RDW: 12.7 % (ref 11.0–14.6)
Retic %: 1.09 % (ref 0.80–1.80)
Retic Ct Abs: 55.37 10*3/uL (ref 34.80–93.90)
WBC: 4.5 10*3/uL (ref 4.0–10.3)
lymph#: 1.1 10*3/uL (ref 0.9–3.3)

## 2017-01-05 LAB — LACTATE DEHYDROGENASE: LDH: 186 U/L (ref 125–245)

## 2017-01-05 NOTE — Telephone Encounter (Signed)
Patient rescheduled for today with MD Irene Limbo. Patient verbalized understanding.

## 2017-01-05 NOTE — Telephone Encounter (Signed)
"  I do not know when my next appointment is but I've felt bad for a the past week.  I have a nagging pain to my stomach at spleen and liver area.  Rate as a four or five on pain scale.  Also have nausea and diarrhea.  I vomited twice Saturday.  Diarrhea occurred like stomach bug territory.  I am trying to eat and drink.  I have not checked my temperature but do not have a fever.  I have not used any OTC medicines for any symptoms."   Provided next appointment information for Wednesday 01-07-2017 beginning at 3:00 pm.  Return number [phone].  "I did not realize I have an appointment this week.  Ask if he can see me today and I'll make myself available.  If not, I'll wait until my scheduled appointment."

## 2017-01-05 NOTE — Progress Notes (Signed)
[name]    HEMATOLOGY/ONCOLOGY CLINIC NOTE  Date of Service: 01/05/2017  Patient Care Team: [name], DO as PCP - General (Internal Medicine)  CHIEF COMPLAINTS/PURPOSE OF CONSULTATION:  Posthospitalization follow-up for Burkitt's lymphoma  Diagnosis: Stage III Burkitt's lymphoma without CNS involvement diagnosed in September 2016.  TREATMENT EPOCH-R + CNS prophylaxis as per NEJM 2013 Dunleavy et al. S/p 6 cycles of EPOCH-R completed 12/17/2015.  s/p 4 doses of intrathecal methotrexate plus hydrocortisone for CNS prophylaxis  INTERVAL HISTORY  [name] is is here for her scheduled 3 month clinic follow-up for his Burkitt's lymphoma.  He is now about a year out after completing his planned treatment for Burkitts lymphoma. He has been feeling well and recently had a vacation in [place] and was feeling well until he returned home and then developed some abdominal discomfort and diarrhea. He notes the diarrhea starting to resolve. No fevers no chills no night sweats no blood or mucus in stools. Clinical abdominal examination benign. No chest pain no shortness of breath. No palpable new enlarged lymph nodes. No other acute new focal symptoms. Labs are WNL and LDH is normal.  MEDICAL HISTORY:   Past Medical History:  Diagnosis Date  . Burkitt lymphoma ([place]) 08/15/2015  . Complication of anesthesia   . EBV infection 2013   Patient notes that he had an EBV infection in 2013 that left him with chronic fatigue. She also notes that he had significant lymphadenopathy and thyroiditis at the time. He has been managing his symptoms with an alternative medicine practitioner in [place] and with other alternative medicines.  . Marijuana use, episodic   . SBO (small bowel obstruction)     SURGICAL HISTORY: Past Surgical History:  Procedure Laterality Date  . APPENDECTOMY    . LAPAROSCOPY N/A 07/20/2015   Procedure: LAPAROSCOPY DIAGNOSTIC, MULTIPLE BIOPSIES, DRAINAGE OF ASCITES;   Surgeon: Fanny Skates, MD;  Location: [facility];  Service: General;  Laterality: N/A;  . TONSILLECTOMY      SOCIAL HISTORY: Social History   Social History  . Marital status: Married    Spouse name: N/A  . Number of children: N/A  . Years of education: N/A   Occupational History  . Not on file.   Social History Main Topics  . Smoking status: Never Smoker  . Smokeless tobacco: Never Used  . Alcohol use No  . Drug use: Yes    Frequency: 1.0 time per week    Types: Marijuana  . Sexual activity: Yes   Other Topics Concern  . 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   He has a fiance and has been in a steady relationship.      He has tried to maintain a very healthy lifestyle and cycles about 100 miles a week. He also uses a fair number of over-the-counter alternative medicines to stay healthy.    FAMILY HISTORY: Family History  Problem Relation Age of Onset  . Heart failure Father     ALLERGIES:  is allergic to levaquin [levofloxacin] and sulfa antibiotics.  MEDICATIONS:  No current outpatient prescriptions on file.   No current facility-administered medications for this visit.    Facility-Administered Medications Ordered in Other Visits  Medication Dose Route Frequency Provider Last Rate Last Dose  . heparin lock flush 100 unit/mL  500 Units Intravenous Once Brunetta Genera, MD      . sodium  chloride flush (NS) 0.9 % injection 10 mL  10 mL Intravenous Once Brunetta Genera, MD        REVIEW OF SYSTEMS:    10 Point review of Systems was done is negative except as noted above.  PHYSICAL EXAMINATION: ECOG PERFORMANCE STATUS:0 VS reviewed and are stable  GENERAL:alert, mild emotional distress, feeling somewhat overwhelmed with the treatment. SKIN: skin color, texture, turgor are normal, no rashes or significant lesions EYES: normal, conjunctiva are pink and  non-injected, sclera clear OROPHARYNX:no exudate, no erythema and lips, buccal mucosa, and tongue normal , coated tongue.  No sinus pain or tenderness.  No significant pharyngeal erythema or exudates. NECK: supple, no JVD, thyroid normal size, non-tender, without nodularity LYMPH:  no palpable lymphadenopathy in the cervical, axillary or inguinal LUNGS: clear to auscultation with normal respiratory effort. HEART: regular rate & rhythm,  no murmurs and no lower extremity edema ABDOMEN: abdomen soft, non-tender, normoactive bowel sounds ,No palpable hepatosplenomegaly.   Musculoskeletal: no cyanosis of digits and no clubbing  PSYCH: alert & oriented x 3 with fluent speech NEURO: no focal motor/sensory deficits  LABORATORY DATA:  I have reviewed the data as listed  . CBC Latest Ref Rng & Units 01/05/2017 10/09/2016 08/08/2016  WBC 4.0 - 10.3 10e3/uL 4.5 6.8 12.3(H)  Hemoglobin 13.0 - 17.1 g/dL 15.0 14.4 15.3  Hematocrit 38.4 - 49.9 % 44.7 42.4 44.6  Platelets 140 - 400 10e3/uL 178 204 197    . CMP Latest Ref Rng & Units 01/05/2017 10/09/2016 08/08/2016  Glucose 70 - 140 mg/dl 96 106 103(H)  BUN 7.0 - 26.0 mg/dL 16.3 17.0 19  Creatinine 0.7 - 1.3 mg/dL 1.1 0.9 0.82  Sodium 136 - 145 mEq/L 139 140 138  Potassium 3.5 - 5.1 mEq/L 4.1 3.8 4.0  Chloride 101 - 111 mmol/L - - 103  CO2 22 - 29 mEq/L 27 27 28   Calcium 8.4 - 10.4 mg/dL 9.1 9.3 9.0  Total Protein 6.4 - 8.3 g/dL 7.0 7.2 6.9  Total Bilirubin 0.20 - 1.20 mg/dL 0.41 0.38 0.9  Alkaline Phos 40 - 150 U/L 67 76 57  AST 5 - 34 U/L 32 20 22  ALT 0 - 55 U/L 48 30 26   . Lab Results  Component Value Date   LDH 186 01/05/2017    RADIOGRAPHIC STUDIES: No new imaging  ASSESSMENT & PLAN:   Patient is a 41 yo male admitted with   1) High risk Burkitt's lymphoma stage III with no CNS involvement -currently in remission.  Cytogenetics showed Myc Break  apart event associated with chromosomal variants of Burkitt's lymphoma t(2;8) and  t(8;22). Patient is status post 6 cycles of EPOCH-R and received 4 doses of intrathecal methotrexate for CNS prophylaxis. That PET CT scan performed 01/07/2016 after completion of planned treatment showed no evidence of residual hypermetabolic disease.  On clinic visit today  patient has no clinical evidence of disease progression at this time. Labs are stable.  LDH drawn today WNL  He is now out about 1 year since completion of his treatment. Plan -Patient has been doing well and reports no clinical symptoms suggestive of disease recurrence at this time. -no indication for additional treatment of his Burkitt's lymphoma at this time. -He was encouraged to maintain an active and healthy lifestyle.  -he is aware of the symptoms of lymphoma recurrence and has been counseled to call us immediately if any new questions or concerns arise .  2) Mild abdominal discomfort with diarrhea  after recent visit to [place]. No blood or mucus in his stools. No fevers. Abdominal examination fairly benign. No leukocytosis on labs. No evidence of dehydration. This likely represents a viral gastroenteritis or traveler's diarrhea. LDH levels within normal limits. Plan -Patient notes no nausea or vomiting and is able to keep well-hydrated. Was counseled on focusing on hydration. -Asked to avoid milk products for a couple weeks other than yogurt. -May use some infant probiotic mixed with yogurt. -May use over-the-counter Pepto-Bismol which might help but traveler's diarrhea. -His diarrhea is already resolving. He was asked to contact us if his diarrhea were to get worse or he developed increased abdominal cramping blood or mucus in the stools or fevers. -No indication for imaging of the abdomen at this time.   Return to care With Dr. Irene Limbo in 3 months with CBC, CMP, LDH. Earlier if any other acute new concerns.  I spent 20 minutes counseling the patient face to face. The total time spent in the appointment was 25  minutes and more than 50% was on counseling and direct patient cares.    Sullivan Lone MD [place] AAHIVMS [facility] Hematology/Oncology Physician [place]  (Office):       [phone] (Work cell):  [phone] (Fax):           [phone]

## 2017-01-07 ENCOUNTER — Ambulatory Visit: Payer: BLUE CROSS/BLUE SHIELD | Admitting: Hematology

## 2017-01-07 ENCOUNTER — Other Ambulatory Visit: Payer: BLUE CROSS/BLUE SHIELD

## 2017-01-14 ENCOUNTER — Inpatient Hospital Stay (HOSPITAL_COMMUNITY)
Admission: EM | Admit: 2017-01-14 | Discharge: 2017-01-16 | DRG: 389 | Disposition: A | Discharge status: Home / Self Care | Payer: BLUE CROSS/BLUE SHIELD | Attending: Internal Medicine | Admitting: Internal Medicine

## 2017-01-14 ENCOUNTER — Encounter (HOSPITAL_COMMUNITY): Payer: Self-pay | Admitting: Emergency Medicine

## 2017-01-14 ENCOUNTER — Emergency Department (HOSPITAL_COMMUNITY): Payer: BLUE CROSS/BLUE SHIELD

## 2017-01-14 DIAGNOSIS — Z8619 Personal history of other infectious and parasitic diseases: Secondary | ICD-10-CM

## 2017-01-14 DIAGNOSIS — Z5329 Procedure and treatment not carried out because of patient's decision for other reasons: Secondary | ICD-10-CM | POA: Diagnosis not present

## 2017-01-14 DIAGNOSIS — C8370 Burkitt lymphoma, unspecified site: Secondary | ICD-10-CM | POA: Diagnosis present

## 2017-01-14 DIAGNOSIS — Z881 Allergy status to other antibiotic agents status: Secondary | ICD-10-CM

## 2017-01-14 DIAGNOSIS — R109 Unspecified abdominal pain: Secondary | ICD-10-CM | POA: Diagnosis present

## 2017-01-14 DIAGNOSIS — Z8659 Personal history of other mental and behavioral disorders: Secondary | ICD-10-CM

## 2017-01-14 DIAGNOSIS — Z9221 Personal history of antineoplastic chemotherapy: Secondary | ICD-10-CM

## 2017-01-14 DIAGNOSIS — I491 Atrial premature depolarization: Secondary | ICD-10-CM | POA: Diagnosis present

## 2017-01-14 DIAGNOSIS — K56609 Unspecified intestinal obstruction, unspecified as to partial versus complete obstruction: Principal | ICD-10-CM | POA: Diagnosis present

## 2017-01-14 DIAGNOSIS — I499 Cardiac arrhythmia, unspecified: Secondary | ICD-10-CM

## 2017-01-14 DIAGNOSIS — F1210 Cannabis abuse, uncomplicated: Secondary | ICD-10-CM | POA: Diagnosis present

## 2017-01-14 DIAGNOSIS — R5382 Chronic fatigue, unspecified: Secondary | ICD-10-CM | POA: Diagnosis present

## 2017-01-14 DIAGNOSIS — R001 Bradycardia, unspecified: Secondary | ICD-10-CM | POA: Diagnosis present

## 2017-01-14 DIAGNOSIS — K565 Intestinal adhesions [bands], unspecified as to partial versus complete obstruction: Secondary | ICD-10-CM | POA: Diagnosis present

## 2017-01-14 DIAGNOSIS — Z882 Allergy status to sulfonamides status: Secondary | ICD-10-CM

## 2017-01-14 DIAGNOSIS — Z8249 Family history of ischemic heart disease and other diseases of the circulatory system: Secondary | ICD-10-CM

## 2017-01-14 DIAGNOSIS — I493 Ventricular premature depolarization: Secondary | ICD-10-CM | POA: Diagnosis present

## 2017-01-14 DIAGNOSIS — Z0189 Encounter for other specified special examinations: Secondary | ICD-10-CM

## 2017-01-14 LAB — CBC
HCT: 45.9 % (ref 39.0–52.0)
Hemoglobin: 15.6 g/dL (ref 13.0–17.0)
MCH: 29.5 pg (ref 26.0–34.0)
MCHC: 34 g/dL (ref 30.0–36.0)
MCV: 86.9 fL (ref 78.0–100.0)
PLATELETS: 223 10*3/uL (ref 150–400)
RBC: 5.28 MIL/uL (ref 4.22–5.81)
RDW: 12.7 % (ref 11.5–15.5)
WBC: 13.3 10*3/uL — ABNORMAL HIGH (ref 4.0–10.5)

## 2017-01-14 LAB — COMPREHENSIVE METABOLIC PANEL
ALT: 30 U/L (ref 17–63)
AST: 26 U/L (ref 15–41)
Albumin: 4.5 g/dL (ref 3.5–5.0)
Alkaline Phosphatase: 61 U/L (ref 38–126)
Anion gap: 9 (ref 5–15)
BUN: 17 mg/dL (ref 6–20)
CALCIUM: 9.2 mg/dL (ref 8.9–10.3)
CO2: 25 mmol/L (ref 22–32)
CREATININE: 0.91 mg/dL (ref 0.61–1.24)
Chloride: 103 mmol/L (ref 101–111)
GFR calc non Af Amer: >60 mL/min (ref >60)
Glucose, Bld: 110 mg/dL — ABNORMAL HIGH (ref 65–99)
Potassium: 3.7 mmol/L (ref 3.5–5.1)
SODIUM: 137 mmol/L (ref 135–145)
Total Bilirubin: 0.8 mg/dL (ref 0.3–1.2)
Total Protein: 7.1 g/dL (ref 6.5–8.1)

## 2017-01-14 LAB — LIPASE, BLOOD: Lipase: 27 U/L (ref 11–51)

## 2017-01-14 MED ORDER — IOPAMIDOL (ISOVUE-300) INJECTION 61%
100.0000 mL | Freq: Once | INTRAVENOUS | Status: AC | PRN
  Administered 2017-01-14: 100 mL via INTRAVENOUS

## 2017-01-14 MED ORDER — HYDROMORPHONE HCL 1 MG/ML IJ SOLN
1.0000 mg | Freq: Once | INTRAMUSCULAR | Status: AC
  Administered 2017-01-14: 1 mg via INTRAVENOUS
  Filled 2017-01-14: qty 1

## 2017-01-14 MED ORDER — IOPAMIDOL (ISOVUE-300) INJECTION 61%
INTRAVENOUS | Status: AC
  Filled 2017-01-14: qty 100

## 2017-01-14 MED ORDER — ONDANSETRON HCL 4 MG/2ML IJ SOLN
4.0000 mg | Freq: Once | INTRAMUSCULAR | Status: AC | PRN
  Administered 2017-01-14: 4 mg via INTRAVENOUS
  Filled 2017-01-14: qty 2

## 2017-01-14 MED ORDER — SODIUM CHLORIDE 0.9 % IV BOLUS (SEPSIS)
1000.0000 mL | Freq: Once | INTRAVENOUS | Status: AC
  Administered 2017-01-14: 1000 mL via INTRAVENOUS

## 2017-01-14 MED ORDER — ONDANSETRON 4 MG PO TBDP
4.0000 mg | ORAL_TABLET | Freq: Once | ORAL | Status: AC | PRN
  Administered 2017-01-14: 4 mg via ORAL
  Filled 2017-01-14: qty 1

## 2017-01-14 NOTE — ED Triage Notes (Signed)
Pt c/o possible bowel obstruction; pt states he's had one 4-5 times before and knows what it feels like; hx of lymphoma and had surgery that left scar tissue in his abdomen; pt dry-heaving in triage; pt endorses severe nausea; no diarrhea

## 2017-01-14 NOTE — ED Notes (Signed)
Patient transported to CT 

## 2017-01-15 ENCOUNTER — Emergency Department (HOSPITAL_COMMUNITY): Payer: BLUE CROSS/BLUE SHIELD

## 2017-01-15 ENCOUNTER — Inpatient Hospital Stay (HOSPITAL_COMMUNITY): Payer: BLUE CROSS/BLUE SHIELD

## 2017-01-15 DIAGNOSIS — I499 Cardiac arrhythmia, unspecified: Secondary | ICD-10-CM | POA: Diagnosis not present

## 2017-01-15 DIAGNOSIS — C8373 Burkitt lymphoma, intra-abdominal lymph nodes: Secondary | ICD-10-CM | POA: Diagnosis not present

## 2017-01-15 DIAGNOSIS — R5382 Chronic fatigue, unspecified: Secondary | ICD-10-CM | POA: Diagnosis present

## 2017-01-15 DIAGNOSIS — Z8659 Personal history of other mental and behavioral disorders: Secondary | ICD-10-CM | POA: Diagnosis not present

## 2017-01-15 DIAGNOSIS — F1210 Cannabis abuse, uncomplicated: Secondary | ICD-10-CM | POA: Diagnosis present

## 2017-01-15 DIAGNOSIS — Z9221 Personal history of antineoplastic chemotherapy: Secondary | ICD-10-CM | POA: Diagnosis not present

## 2017-01-15 DIAGNOSIS — Z5329 Procedure and treatment not carried out because of patient's decision for other reasons: Secondary | ICD-10-CM | POA: Diagnosis not present

## 2017-01-15 DIAGNOSIS — Z8619 Personal history of other infectious and parasitic diseases: Secondary | ICD-10-CM | POA: Diagnosis not present

## 2017-01-15 DIAGNOSIS — I491 Atrial premature depolarization: Secondary | ICD-10-CM | POA: Diagnosis present

## 2017-01-15 DIAGNOSIS — Z881 Allergy status to other antibiotic agents status: Secondary | ICD-10-CM | POA: Diagnosis not present

## 2017-01-15 DIAGNOSIS — Z8249 Family history of ischemic heart disease and other diseases of the circulatory system: Secondary | ICD-10-CM | POA: Diagnosis not present

## 2017-01-15 DIAGNOSIS — R109 Unspecified abdominal pain: Secondary | ICD-10-CM | POA: Diagnosis present

## 2017-01-15 DIAGNOSIS — K56609 Unspecified intestinal obstruction, unspecified as to partial versus complete obstruction: Principal | ICD-10-CM

## 2017-01-15 DIAGNOSIS — C8370 Burkitt lymphoma, unspecified site: Secondary | ICD-10-CM | POA: Diagnosis present

## 2017-01-15 DIAGNOSIS — R001 Bradycardia, unspecified: Secondary | ICD-10-CM | POA: Diagnosis present

## 2017-01-15 DIAGNOSIS — Z882 Allergy status to sulfonamides status: Secondary | ICD-10-CM | POA: Diagnosis not present

## 2017-01-15 DIAGNOSIS — K565 Intestinal adhesions [bands], unspecified as to partial versus complete obstruction: Secondary | ICD-10-CM | POA: Diagnosis present

## 2017-01-15 DIAGNOSIS — I493 Ventricular premature depolarization: Secondary | ICD-10-CM | POA: Diagnosis present

## 2017-01-15 LAB — BASIC METABOLIC PANEL
ANION GAP: 6 (ref 5–15)
BUN: 16 mg/dL (ref 6–20)
CO2: 26 mmol/L (ref 22–32)
Calcium: 8.5 mg/dL — ABNORMAL LOW (ref 8.9–10.3)
Chloride: 106 mmol/L (ref 101–111)
Creatinine, Ser: 0.8 mg/dL (ref 0.61–1.24)
GFR calc Af Amer: >60 mL/min (ref >60)
GLUCOSE: 119 mg/dL — AB (ref 65–99)
POTASSIUM: 4 mmol/L (ref 3.5–5.1)
Sodium: 138 mmol/L (ref 135–145)

## 2017-01-15 LAB — APTT: aPTT: 26 seconds (ref 24–36)

## 2017-01-15 LAB — TYPE AND SCREEN
ABO/RH(D): O POS
ANTIBODY SCREEN: NEGATIVE

## 2017-01-15 LAB — CBC
HCT: 42.8 % (ref 39.0–52.0)
HEMOGLOBIN: 14.5 g/dL (ref 13.0–17.0)
MCH: 29.6 pg (ref 26.0–34.0)
MCHC: 33.9 g/dL (ref 30.0–36.0)
MCV: 87.3 fL (ref 78.0–100.0)
Platelets: 219 10*3/uL (ref 150–400)
RBC: 4.9 MIL/uL (ref 4.22–5.81)
RDW: 12.7 % (ref 11.5–15.5)
WBC: 11.8 10*3/uL — ABNORMAL HIGH (ref 4.0–10.5)

## 2017-01-15 LAB — RAPID URINE DRUG SCREEN, HOSP PERFORMED
Amphetamines: NOT DETECTED
BARBITURATES: NOT DETECTED
BENZODIAZEPINES: NOT DETECTED
COCAINE: NOT DETECTED
OPIATES: POSITIVE — AB
Tetrahydrocannabinol: POSITIVE — AB

## 2017-01-15 LAB — GLUCOSE, CAPILLARY: GLUCOSE-CAPILLARY: 83 mg/dL (ref 65–99)

## 2017-01-15 LAB — URINALYSIS, ROUTINE W REFLEX MICROSCOPIC
Bilirubin Urine: NEGATIVE
Glucose, UA: NEGATIVE mg/dL
HGB URINE DIPSTICK: NEGATIVE
Ketones, ur: NEGATIVE mg/dL
LEUKOCYTES UA: NEGATIVE
Nitrite: NEGATIVE
PROTEIN: NEGATIVE mg/dL
pH: 6 (ref 5.0–8.0)

## 2017-01-15 LAB — PROTIME-INR
INR: 1.07
Prothrombin Time: 13.9 seconds (ref 11.4–15.2)

## 2017-01-15 LAB — ABO/RH: ABO/RH(D): O POS

## 2017-01-15 MED ORDER — LIP MEDEX EX OINT
1.0000 "application " | TOPICAL_OINTMENT | Freq: Two times a day (BID) | CUTANEOUS | Status: DC
  Administered 2017-01-15: 1 via TOPICAL

## 2017-01-15 MED ORDER — HYDROMORPHONE HCL 1 MG/ML IJ SOLN
1.0000 mg | INTRAMUSCULAR | Status: DC | PRN
  Administered 2017-01-15: 1 mg via INTRAVENOUS

## 2017-01-15 MED ORDER — HYDROMORPHONE HCL 1 MG/ML IJ SOLN
INTRAMUSCULAR | Status: AC
  Filled 2017-01-15: qty 1

## 2017-01-15 MED ORDER — ACETAMINOPHEN 650 MG RE SUPP: 650.0000 mg | Freq: Four times a day (QID) | RECTAL | Status: DC | PRN

## 2017-01-15 MED ORDER — LACTATED RINGERS IV BOLUS (SEPSIS): 1000.0000 mL | Freq: Three times a day (TID) | INTRAVENOUS | Status: DC | PRN

## 2017-01-15 MED ORDER — LACTATED RINGERS IV BOLUS (SEPSIS)
1000.0000 mL | Freq: Once | INTRAVENOUS | Status: AC
  Administered 2017-01-15: 1000 mL via INTRAVENOUS

## 2017-01-15 MED ORDER — DIATRIZOATE MEGLUMINE & SODIUM 66-10 % PO SOLN
90.0000 mL | Freq: Once | ORAL | Status: AC
  Administered 2017-01-15: 90 mL via NASOGASTRIC
  Filled 2017-01-15: qty 90

## 2017-01-15 MED ORDER — ZOLPIDEM TARTRATE 5 MG PO TABS: 5.0000 mg | ORAL_TABLET | Freq: Every evening | ORAL | Status: DC | PRN

## 2017-01-15 MED ORDER — MENTHOL 3 MG MT LOZG: 1.0000 | LOZENGE | OROMUCOSAL | Status: DC | PRN

## 2017-01-15 MED ORDER — MAGIC MOUTHWASH
15.0000 mL | Freq: Four times a day (QID) | ORAL | Status: DC | PRN
  Filled 2017-01-15: qty 15

## 2017-01-15 MED ORDER — BENZOCAINE 20 % MT AERO
INHALATION_SPRAY | Freq: Two times a day (BID) | OROMUCOSAL | Status: DC | PRN
  Filled 2017-01-15: qty 57

## 2017-01-15 MED ORDER — SODIUM CHLORIDE 0.9 % IV SOLN
INTRAVENOUS | Status: DC
  Administered 2017-01-15 – 2017-01-16 (×4): via INTRAVENOUS

## 2017-01-15 MED ORDER — ONDANSETRON HCL 4 MG/2ML IJ SOLN: 4.0000 mg | Freq: Three times a day (TID) | INTRAMUSCULAR | Status: DC | PRN

## 2017-01-15 MED ORDER — ACETAMINOPHEN 325 MG PO TABS: 650.0000 mg | ORAL_TABLET | Freq: Four times a day (QID) | ORAL | Status: DC | PRN

## 2017-01-15 MED ORDER — BISACODYL 10 MG RE SUPP: 10.0000 mg | Freq: Every day | RECTAL | Status: DC

## 2017-01-15 MED ORDER — PHENOL 1.4 % MT LIQD
2.0000 | OROMUCOSAL | Status: DC | PRN
  Filled 2017-01-15: qty 177

## 2017-01-15 NOTE — Consult Note (Signed)
[place]  [address] [place], [place] [address] Phone: [phone] FAX: [phone]     Michah [name]  09-06-1976 [id number]  CARE TEAM:  PCP: Drema Pry, DO  Outpatient Care Team: Patient Care Team: [name], DO as PCP - General (Internal Medicine)  Inpatient Treatment Team: Treatment Team: Attending Provider: Ivor Costa, MD; Registered Nurse: Yvette Rack, RN; Rounding Team: Fatima Blank, MD; Consulting Physician: Nolon Nations, MD   This patient is a 41 y.o.male who presents today for surgical evaluation at the request of Gareth Morgan, MD, [facility] ED.   Reason for evaluation: Recurrent SBO  41 year old male.  Carson ptosis and ascites.  Underwent laparoscopic biopsying in 2016.  Consistent with stage III Burkitt's lymphoma.  Underwent treatment:  EPOCH-R + CNS prophylaxis as per [facility] 2013 Dunleavy et al. S/p 6 cycles of EPOCH-R completed 12/17/2015. s/p 4 doses of intrathecal methotrexate plus hydrocortisone for CNS prophylaxis  Followed by Dr. Irene Limbo with hematology.  Appears to be at least a year disease free now.  However he gets intermittent episodes of severe crampy abdominal pain with nausea and vomiting.  Has had a few scans concerning for small bowel obstruction.  Usually goes to the ER get some IV fluids and feels better.  Does not like to be admitted.  We have not seen him in a year and a half.  I rate came in with rather severe crampy abdominal pain.  Underwent CAT scan.  Suspicious for high-grade bowel obstruction.  The just right of periumbilical region anterior abdominal wall.    Surgical consultation requested.  I recommended small bowel protocol with nasogastric tube, Gastrografin, and serial x-rays/exams.  Patient has difficult nose/sinus issues so difficult to get a nasogastric tube in him.  After two attempts he refused any other.  He refused to drink the Gastrografin contrast for the nurse, stating he was  worried he would throw it up.  On the other hand, he tells me his bowel obstruction is now totally resolved a few hours later from admission.  He is hoping that I can give him a diet and let him go home.  I tried to caution him against advancing too quickly  No personal nor family history of GI/colon cancer, inflammatory bowel disease, irritable bowel syndrome, allergy such as Celiac Sprue, dietary/dairy problems, colitis, ulcers nor gastritis.  No recent sick contacts/gastroenteritis.  No travel outside the country.  No changes in diet.  No dysphagia to solids or liquids.  No significant heartburn or reflux.  No hematochezia, hematemesis, coffee ground emesis.  No evidence of prior gastric/peptic ulceration.    Assessment  Donald [name]  41 y.o. male       Problem List:  Principal Problem:   SBO (small bowel obstruction) Active Problems:   Burkitt's lymphoma ([place])   History recurrent bowel obstructions.  Suspicious for high-grade at this point.  Noncompliant with small bowel protocol due to difficulty getting nasogastric tube in, etc  Plan:  -I am skeptical that his bowel obstruction is completely resolved this quickly.  He thinks it is because he took some pistachios last night.  He recalls another attack happened when he had almonds.  He is having a little bit of flatus but no bowel movements.  He is not nauseated.  Reasonable to try liquid diet and see how he tolerates it.  Serial exams & Xrays.    Should he tolerate liquids and advanced to a solid diet and  an exam and x-rays improved with flatus and bowel movements, that  would argue that the bowel obstructions resolve.  Statistically at about a 75% chance.  However in a patient with known prior carcinomatosis and recurrent episodes of obstructive-like symptoms, I am skeptical that this will not resolve without surgery or that surgery at some point is imminent.  I suspect that he has a chronically kinked off for strictured area that  will not resolve without surgery.    Would follow clinically.  He does not seem interested in surgery at this time.   I think he is hoping that this can be resolved electively in the future at his convenience.    I tried to caution the patient that it usually does not work that way.  The patient is stable.  There is no evidence of peritonitis, acute abdomen, nor shock.  There is no strong evidence of failure of improvement nor decline with current non-operative management.  There is no need for emergent surgery at the present moment.   The patient refuses to consider surgery at this time anyway.  We will continue to follow.  Dr Marlou Starks on the inpatient [facility] surgical service aware and will follow  -VTE prophylaxis- SCDs, etc  -mobilize as tolerated to help recovery    Adin Hector, M.D., F.A.C.S. Gastrointestinal and Minimally Invasive Surgery [facility] [address] [place], [place] [address] [phone] Main / Paging   01/15/2017      Past Medical History:  Diagnosis Date  . Burkitt lymphoma ([place]) 08/15/2015  . Complication of anesthesia   . EBV infection 2013   Patient notes that he had an EBV infection in 2013 that left him with chronic fatigue. She also notes that he had significant lymphadenopathy and thyroiditis at the time. He has been managing his symptoms with an alternative medicine practitioner in [place] and with other alternative medicines.  . Marijuana use, episodic   . SBO (small bowel obstruction)     Past Surgical History:  Procedure Laterality Date  . APPENDECTOMY    . LAPAROSCOPY N/A 07/20/2015   Procedure: LAPAROSCOPY DIAGNOSTIC, MULTIPLE BIOPSIES, DRAINAGE OF ASCITES;  Surgeon: Fanny Skates, MD;  Location: [facility] ORS;  Service: General;  Laterality: N/A;  . TONSILLECTOMY      Social History   Social History  . Marital status: Married    Spouse name: N/A  . Number of children: N/A  . Years of education: N/A    Occupational History  . Not on file.   Social History Main Topics  . Smoking status: Never Smoker  . Smokeless tobacco: Never Used  . Alcohol use No  . Drug use: Yes    Frequency: 1.0 time per week    Types: Marijuana  . Sexual activity: Yes   Other Topics Concern  . 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   He has a fiance and has been in a steady relationship.      He has tried to maintain a very healthy lifestyle and cycles about 100 miles a week. He also uses a fair number of over-the-counter alternative medicines to stay healthy.    Family History  Problem Relation Age of Onset  . Heart failure Father     Current Facility-Administered Medications  Medication Dose Route Frequency Provider Last Rate Last Dose  . 0.9 %  sodium chloride  infusion   Intravenous Continuous Ivor Costa, MD 125 mL/hr at 01/15/17 0119    . acetaminophen (TYLENOL) tablet 650 mg  650 mg Oral Q6H PRN Ivor Costa, MD       Or  . acetaminophen (TYLENOL) suppository 650 mg  650 mg Rectal Q6H PRN Ivor Costa, MD      . Benzocaine (HURRCAINE) 20 % mouth spray   Mouth/Throat BID PRN Gareth Morgan, MD      . diatrizoate meglumine-sodium (GASTROGRAFIN) 66-10 % solution 90 mL  90 mL Per NG tube Once Gareth Morgan, MD   Stopped at 01/15/17 0119  . HYDROmorphone (DILAUDID) injection 1 mg  1 mg Intravenous Q4H PRN Ivor Costa, MD   1 mg at 01/15/17 0116  . ondansetron (ZOFRAN) injection 4 mg  4 mg Intravenous Q8H PRN Ivor Costa, MD      . zolpidem (AMBIEN) tablet 5 mg  5 mg Oral QHS PRN Ivor Costa, MD       Facility-Administered Medications Ordered in Other Encounters  Medication Dose Route Frequency Provider Last Rate Last Dose  . heparin lock flush 100 unit/mL  500 Units Intravenous Once Brunetta Genera, MD      . sodium chloride flush (NS) 0.9 % injection 10 mL  10 mL Intravenous Once Gautam Juleen China, MD          Allergies  Allergen Reactions  . Levaquin [Levofloxacin] Other (See Comments)    Patient prefers not to take fluoroquinolones. He denies having any allergic reaction to them.  . Sulfa Antibiotics Other (See Comments)    Unknown, Patient was a child and does not remember the reaction    ROS: Constitutional:  No fevers, chills, sweats.  Weight stable Eyes:  No vision changes, No discharge HENT:  No sore throats, nasal drainage Lymph: No neck swelling, No bruising easily Pulmonary:  No cough, productive sputum CV: No orthopnea, PND  Patient walks 30 minutes for about 1 miles without difficulty.  No exertional chest/neck/shoulder/arm pain. GI: No personal nor family history of GI/colon cancer, inflammatory bowel disease, irritable bowel syndrome, allergy such as Celiac Sprue, dietary/dairy problems, colitis, ulcers nor gastritis.  No recent sick contacts/gastroenteritis.  No travel outside the country.  No changes in diet. Renal: No UTIs, No hematuria Genital:  No drainage, bleeding, masses Musculoskeletal: No severe joint pain.  Good ROM major joints Skin:  No sores or lesions.  No rashes Heme/Lymph:  No easy bleeding.  No swollen lymph nodes Neuro: No focal weakness/numbness.  No seizures Psych: No suicidal ideation.  No hallucinations  BP 115/63 (BP Location: Left Arm)   Pulse (!) 51   Temp 97.9 F (36.6 C) (Oral)   Resp 16   Ht 6' (1.829 m)   Wt 86.2 kg (190 lb)   SpO2 97%   BMI 25.77 kg/m   Physical Exam: General: Pt awake/alert/oriented x4 in no major acute distress.   Eyes: PERRL, normal EOM. Sclera nonicteric Neuro: CN II-XII intact w/o focal sensory/motor deficits. Lymph: No head/neck/groin lymphadenopathy Psych:  No delerium/psychosis/paranoia.  Chatty but no pressured speech HENT: Normocephalic, Mucus membranes moist.  No thrush Neck: Supple, No tracheal deviation Chest: No pain.  Good respiratory excursion. CV:  Pulses intact.  Regular rhythm Abdomen:  Overweight.  Mildly firm.  Moderately distended.  No diastases.  No umbilical or incisional hernias  Gen:  No inguinal hernias.  No inguinal lymphadenopathy.   Ext:  SCDs BLE.  No significant edema.  No cyanosis Skin: No petechiae /  purpurea.  No major sores Musculoskeletal: No severe joint pain.  Good ROM major joints   Results:   Labs: Results for orders placed or performed during the hospital encounter of 01/14/17 (from the past 48 hour(s))  Lipase, blood     Status: None   Collection Time: 01/14/17  9:27 PM  Result Value Ref Range   Lipase 27 11 - 51 U/L  Comprehensive metabolic panel     Status: Abnormal   Collection Time: 01/14/17  9:27 PM  Result Value Ref Range   Sodium 137 135 - 145 mmol/L   Potassium 3.7 3.5 - 5.1 mmol/L   Chloride 103 101 - 111 mmol/L   CO2 25 22 - 32 mmol/L   Glucose, Bld 110 (H) 65 - 99 mg/dL   BUN 17 6 - 20 mg/dL   Creatinine, Ser 0.91 0.61 - 1.24 mg/dL   Calcium 9.2 8.9 - 10.3 mg/dL   Total Protein 7.1 6.5 - 8.1 g/dL   Albumin 4.5 3.5 - 5.0 g/dL   AST 26 15 - 41 U/L   ALT 30 17 - 63 U/L   Alkaline Phosphatase 61 38 - 126 U/L   Total Bilirubin 0.8 0.3 - 1.2 mg/dL   GFR calc non Af Amer >60 >60 mL/min   GFR calc Af Amer >60 >60 mL/min    Comment: (NOTE) The eGFR has been calculated using the CKD EPI equation. This calculation has not been validated in all clinical situations. eGFR's persistently <60 mL/min signify possible Chronic Kidney Disease.    Anion gap 9 5 - 15  CBC     Status: Abnormal   Collection Time: 01/14/17  9:27 PM  Result Value Ref Range   WBC 13.3 (H) 4.0 - 10.5 K/uL   RBC 5.28 4.22 - 5.81 MIL/uL   Hemoglobin 15.6 13.0 - 17.0 g/dL   HCT 45.9 39.0 - 52.0 %   MCV 86.9 78.0 - 100.0 fL   MCH 29.5 26.0 - 34.0 pg   MCHC 34.0 30.0 - 36.0 g/dL   RDW 12.7 11.5 - 15.5 %   Platelets 223 150 - 400 K/uL  Urinalysis, Routine w reflex microscopic     Status: Abnormal   Collection Time: 01/14/17 11:54 PM  Result Value Ref Range    Color, Urine YELLOW YELLOW   APPearance CLEAR CLEAR   Specific Gravity, Urine >1.046 (H) 1.005 - 1.030   pH 6.0 5.0 - 8.0   Glucose, UA NEGATIVE NEGATIVE mg/dL   Hgb urine dipstick NEGATIVE NEGATIVE   Bilirubin Urine NEGATIVE NEGATIVE   Ketones, ur NEGATIVE NEGATIVE mg/dL   Protein, ur NEGATIVE NEGATIVE mg/dL   Nitrite NEGATIVE NEGATIVE   Leukocytes, UA NEGATIVE NEGATIVE  Type and screen [place]     Status: None   Collection Time: 01/15/17  2:15 AM  Result Value Ref Range   ABO/RH(D) O POS    Antibody Screen NEG    Sample Expiration 07/37/1062   Basic metabolic panel     Status: Abnormal   Collection Time: 01/15/17  2:15 AM  Result Value Ref Range   Sodium 138 135 - 145 mmol/L   Potassium 4.0 3.5 - 5.1 mmol/L   Chloride 106 101 - 111 mmol/L   CO2 26 22 - 32 mmol/L   Glucose, Bld 119 (H) 65 - 99 mg/dL   BUN 16 6 - 20 mg/dL   Creatinine, Ser 0.80 0.61 - 1.24 mg/dL   Calcium 8.5 (L) 8.9 - 10.3 mg/dL  GFR calc non Af Amer >60 >60 mL/min   GFR calc Af Amer >60 >60 mL/min    Comment: (NOTE) The eGFR has been calculated using the CKD EPI equation. This calculation has not been validated in all clinical situations. eGFR's persistently <60 mL/min signify possible Chronic Kidney Disease.    Anion gap 6 5 - 15  CBC     Status: Abnormal   Collection Time: 01/15/17  2:15 AM  Result Value Ref Range   WBC 11.8 (H) 4.0 - 10.5 K/uL   RBC 4.90 4.22 - 5.81 MIL/uL   Hemoglobin 14.5 13.0 - 17.0 g/dL   HCT 42.8 39.0 - 52.0 %   MCV 87.3 78.0 - 100.0 fL   MCH 29.6 26.0 - 34.0 pg   MCHC 33.9 30.0 - 36.0 g/dL   RDW 12.7 11.5 - 15.5 %   Platelets 219 150 - 400 K/uL  Protime-INR     Status: None   Collection Time: 01/15/17  2:27 AM  Result Value Ref Range   Prothrombin Time 13.9 11.4 - 15.2 seconds   INR 1.07   APTT     Status: None   Collection Time: 01/15/17  2:27 AM  Result Value Ref Range   aPTT 26 24 - 36 seconds    Imaging / Studies: Ct Abdomen Pelvis W  Contrast  Result Date: 01/14/2017 CLINICAL DATA:  Abdominal pain, similar to previous bowel obstructions. EXAM: CT ABDOMEN AND PELVIS WITH CONTRAST TECHNIQUE: Multidetector CT imaging of the abdomen and pelvis was performed using the standard protocol following bolus administration of intravenous contrast. CONTRAST:  19m ISOVUE-300 IOPAMIDOL (ISOVUE-300) INJECTION 61% COMPARISON:  08/09/2016, 01/07/2016 FINDINGS: Lower chest: No acute abnormality. Hepatobiliary: No focal liver abnormality is seen. No gallstones, gallbladder wall thickening, or biliary dilatation. Pancreas: Unremarkable. No pancreatic ductal dilatation or surrounding inflammatory changes. Spleen: Normal in size without focal abnormality. Adrenals/Urinary Tract: Both adrenals are normal. No significant renal parenchymal lesions. Probable 2 mm calculus in the lower pole right renal collecting system, coronal image 105 series 3. Otherwise normal appearances of the collecting systems and ureters. Urinary bladder is unremarkable. Stomach/Bowel: Stomach is unremarkable. There is abnormal dilatation and mural edema of small bowel, with adjacent mesenteric edema. Abrupt caliber transition to decompressed ileum in the anterior abdomen, without evidence of mass or hernia. This may represent adhesions. Colon is unremarkable. Vascular/Lymphatic: No significant vascular findings are present. No enlarged abdominal or pelvic lymph nodes. Reproductive: Unremarkable Other: Small volume ascites, predominantly in the right lower quadrant adjacent to obstructed small bowel. Musculoskeletal: No significant skeletal lesion. IMPRESSION: 1. Moderately high-grade small bowel obstruction, probably due to adhesions. Transition point is in the anterior abdomen to the right of midline at the level of the umbilicus. 2. No other acute findings. 3. Probable lower pole right nephrolithiasis, nonobstructing. Electronically Signed   By: DAndreas NewportM.D.   On: 01/14/2017 23:44     Medications / Allergies: per chart  Antibiotics: Anti-infectives    None        Note: Portions of this report may have been transcribed using voice recognition software. Every effort was made to ensure accuracy; however, inadvertent computerized transcription errors may be present.   Any transcriptional errors that result from this process are unintentional.    SAdin Hector M.D., F.A.C.S. Gastrointestinal and Minimally Invasive Surgery [facility] [address] [place] [phone] / Paging   01/15/2017

## 2017-01-15 NOTE — H&P (Signed)
History and Physical    Donald [name] TIW:[id number] DOB: 01-06-1976 DOA: 01/14/2017  Referring MD/NP/PA:   PCP: Drema Pry, DO   Patient coming from:  The patient is coming from home.  At baseline, pt is independent for most of ADL.   Chief Complaint: Nausea, vomiting, abdominal pain and abdominal distention  HPI: Donald [name] is a 42 y.o. male with medical history significant of Burkitt's lymphoma (s/p of surgery and chemotherapy), prior SBO secondary to adhesions, EBV, marijuana abuse, who presents with nausea, vomiting, abdominal pain.  Patient states that his symptoms started yesterday. He vomited more than 5 times without blood in the vomitus. His abdominal pain is diffuse, constant, 8 out of the severity, cramping pain. His last bowel movement is early of this morning. No diarrhea. No fever or chills. Patient denies chest pain, SOB, cough, symptoms of UTI or unilateral weakness.  ED Course: pt was found to have WBC 13.3, negative urinalysis, electrolytes renal function okay, temperature normal, no tachycardia, oxygen saturation normal on room air. CT-abdomen/pelvis showed moderately high-grade small bowel obstruction with transition point in the anterior abdomen to the right of midline at the level of the umbilicus; and probable lower pole right nephrolithiasis, nonobstructing. Pt is admited to med-surg bed as inpt. Gen. surgeon Dr. Johney Maine was consulted.  Review of Systems:   General: no fevers, chills, no changes in body weight, has poor appetite, has fatigue HEENT: no blurry vision, hearing changes or sore throat Respiratory: no dyspnea, coughing, wheezing CV: no chest pain, no palpitations GI: has nausea, vomiting, abdominal pain, no diarrhea. GU: no dysuria, burning on urination, increased urinary frequency, hematuria  Ext: no leg edema Neuro: no unilateral weakness, numbness, or tingling, no vision change or hearing loss Skin: no rash, no skin tear. MSK: No muscle spasm, no  deformity, no limitation of range of movement in spin Heme: No easy bruising.  Travel history: No recent long distant travel.  Allergy:  Allergies  Allergen Reactions  . Levaquin [Levofloxacin] Other (See Comments)    Patient prefers not to take fluoroquinolones. He denies having any allergic reaction to them.  . Sulfa Antibiotics Other (See Comments)    Unknown, Patient was a child and does not remember the reaction    Past Medical History:  Diagnosis Date  . Burkitt lymphoma ([place]) 08/15/2015  . Complication of anesthesia   . EBV infection 2013   Patient notes that he had an EBV infection in 2013 that left him with chronic fatigue. She also notes that he had significant lymphadenopathy and thyroiditis at the time. He has been managing his symptoms with an alternative medicine practitioner in [place] and with other alternative medicines.  . Marijuana use, episodic   . SBO (small bowel obstruction)     Past Surgical History:  Procedure Laterality Date  . APPENDECTOMY    . LAPAROSCOPY N/A 07/20/2015   Procedure: LAPAROSCOPY DIAGNOSTIC, MULTIPLE BIOPSIES, DRAINAGE OF ASCITES;  Surgeon: Fanny Skates, MD;  Location: [facility];  Service: General;  Laterality: N/A;  . TONSILLECTOMY      Social History:  reports that he has never smoked. He has never used smokeless tobacco. He reports that he uses drugs, including Marijuana, about 1 time per week. He reports that he does not drink alcohol.  Family History:  Family History  Problem Relation Age of Onset  . Heart failure Father      Prior to Admission medications   Not on File    Physical Exam: Vitals:   01/14/17  2253 01/15/17 0000 01/15/17 0057 01/15/17 0151  BP:  123/73 (!) 130/102 115/63  Pulse:  60 68 (!) 51  Resp:  16 16 16   Temp:    97.9 F (36.6 C)  TempSrc:    Oral  SpO2:  99% 98% 97%  Weight: 86.2 kg (190 lb)     Height: 6' (1.829 m)      General: Not in acute distress HEENT:       Eyes: PERRL, EOMI, no  scleral icterus.       ENT: No discharge from the ears and nose, no pharynx injection, no tonsillar enlargement.        Neck: No JVD, no bruit, no mass felt. Heme: No neck lymph node enlargement. Cardiac: S1/S2, RRR, No murmurs, No gallops or rubs. Respiratory: No rales, wheezing, rhonchi or rubs. GI: distended, diffusely tender, no rebound pain, no organomegaly, BS present. GU: No hematuria Ext: No pitting leg edema bilaterally. 2+DP/PT pulse bilaterally. Musculoskeletal: No joint deformities, No joint redness or warmth, no limitation of ROM in spin. Skin: No rashes.  Neuro: Alert, oriented X3, cranial nerves II-XII grossly intact, moves all extremities normally.  Psych: Patient is not psychotic, no suicidal or hemocidal ideation.  Labs on Admission: I have personally reviewed following labs and imaging studies  CBC:  Recent Labs Lab 01/14/17 2127  WBC 13.3*  HGB 15.6  HCT 45.9  MCV 86.9  PLT 121   Basic Metabolic Panel:  Recent Labs Lab 01/14/17 2127  NA 137  K 3.7  CL 103  CO2 25  GLUCOSE 110*  BUN 17  CREATININE 0.91  CALCIUM 9.2   GFR: Estimated Creatinine Clearance: 116.1 mL/min (by C-G formula based on SCr of 0.91 mg/dL). Liver Function Tests:  Recent Labs Lab 01/14/17 2127  AST 26  ALT 30  ALKPHOS 61  BILITOT 0.8  PROT 7.1  ALBUMIN 4.5    Recent Labs Lab 01/14/17 2127  LIPASE 27   No results for input(s): AMMONIA in the last 168 hours. Coagulation Profile:  Recent Labs Lab 01/15/17 0227  INR 1.07   Cardiac Enzymes: No results for input(s): CKTOTAL, CKMB, CKMBINDEX, TROPONINI in the last 168 hours. BNP (last 3 results) No results for input(s): PROBNP in the last 8760 hours. HbA1C: No results for input(s): HGBA1C in the last 72 hours. CBG: No results for input(s): GLUCAP in the last 168 hours. Lipid Profile: No results for input(s): CHOL, HDL, LDLCALC, TRIG, CHOLHDL, LDLDIRECT in the last 72 hours. Thyroid Function Tests: No  results for input(s): TSH, T4TOTAL, FREET4, T3FREE, THYROIDAB in the last 72 hours. Anemia Panel: No results for input(s): VITAMINB12, FOLATE, FERRITIN, TIBC, IRON, RETICCTPCT in the last 72 hours. Urine analysis:    Component Value Date/Time   COLORURINE YELLOW 01/14/2017 2354   APPEARANCEUR CLEAR 01/14/2017 2354   LABSPEC >1.046 (H) 01/14/2017 2354   PHURINE 6.0 01/14/2017 2354   GLUCOSEU NEGATIVE 01/14/2017 2354   HGBUR NEGATIVE 01/14/2017 2354   BILIRUBINUR NEGATIVE 01/14/2017 2354   KETONESUR NEGATIVE 01/14/2017 2354   PROTEINUR NEGATIVE 01/14/2017 2354   UROBILINOGEN 0.2 07/26/2015 1627   NITRITE NEGATIVE 01/14/2017 2354   LEUKOCYTESUR NEGATIVE 01/14/2017 2354   Sepsis Labs: @LABRCNTIP (procalcitonin:4,lacticidven:4) )No results found for this or any previous visit (from the past 240 hour(s)).   Radiological Exams on Admission: Ct Abdomen Pelvis W Contrast  Result Date: 01/14/2017 CLINICAL DATA:  Abdominal pain, similar to previous bowel obstructions. EXAM: CT ABDOMEN AND PELVIS WITH CONTRAST TECHNIQUE: Multidetector CT imaging of  the abdomen and pelvis was performed using the standard protocol following bolus administration of intravenous contrast. CONTRAST:  139ml ISOVUE-300 IOPAMIDOL (ISOVUE-300) INJECTION 61% COMPARISON:  08/09/2016, 01/07/2016 FINDINGS: Lower chest: No acute abnormality. Hepatobiliary: No focal liver abnormality is seen. No gallstones, gallbladder wall thickening, or biliary dilatation. Pancreas: Unremarkable. No pancreatic ductal dilatation or surrounding inflammatory changes. Spleen: Normal in size without focal abnormality. Adrenals/Urinary Tract: Both adrenals are normal. No significant renal parenchymal lesions. Probable 2 mm calculus in the lower pole right renal collecting system, coronal image 105 series 3. Otherwise normal appearances of the collecting systems and ureters. Urinary bladder is unremarkable. Stomach/Bowel: Stomach is unremarkable. There is  abnormal dilatation and mural edema of small bowel, with adjacent mesenteric edema. Abrupt caliber transition to decompressed ileum in the anterior abdomen, without evidence of mass or hernia. This may represent adhesions. Colon is unremarkable. Vascular/Lymphatic: No significant vascular findings are present. No enlarged abdominal or pelvic lymph nodes. Reproductive: Unremarkable Other: Small volume ascites, predominantly in the right lower quadrant adjacent to obstructed small bowel. Musculoskeletal: No significant skeletal lesion. IMPRESSION: 1. Moderately high-grade small bowel obstruction, probably due to adhesions. Transition point is in the anterior abdomen to the right of midline at the level of the umbilicus. 2. No other acute findings. 3. Probable lower pole right nephrolithiasis, nonobstructing. Electronically Signed   By: Andreas Newport M.D.   On: 01/14/2017 23:44     EKG:  Not done in ED, will get one.   Assessment/Plan Principal Problem:   SBO (small bowel obstruction) Active Problems:   Burkitt's lymphoma ([facility])   SBO (small bowel obstruction): CT-abdomen/pelvis showed moderately high-grade small bowel obstruction with transition point in the anterior abdomen to the right of midline at the level of the umbilicus. Currently patient is hemodynamically stable. He is most likely due to adhesion secondary to previous surgery for portable technique, which left scar tissue. Gen. surgeon, Dr. Johney Maine was consulted.  -Admit to med surg bed as inpt -NPO  -prn NG tube and SBO protocol started -Dilaruid prn pain -Prn Zofran prn nausea  -IVF: 1L NS and then 125 cc/h -INR/PTT/type & screen -Follow-up general surgeons recommendation  Hx of  Burkitt's lymphoma [facility]): s/p of surgery and chemotherapy. Following up with Dr. Irene Limbo. Last seen on 01/05/17.  -stable per dr. Grier Mitts note.  DVT ppx: SCD Code Status: Full code Family Communication: None at bed side.   Disposition Plan:  Anticipate  discharge back to previous home environment Consults called:  Gen. Surgeon, Dr. Johney Maine Admission status: medical floor/inpt Date of Service 01/15/2017    Ivor Costa [facility] Pager [phone]  If 7PM-7AM, please contact night-coverage [email or web address] Password [email or web address] 01/15/2017, 4:50 AM

## 2017-01-15 NOTE — ED Notes (Signed)
NG Tube unable to be placed met resistance during advancement of tube through right nostril. Pt reports hx of nasal fracture and deviated septum.

## 2017-01-15 NOTE — ED Provider Notes (Signed)
[facility] DEPT Provider Note   CSN: [id number] Arrival date & time: 01/14/17  2056     History   Chief Complaint Chief Complaint  Patient presents with  . Abdominal Pain    HPI Donald [name] is a 41 y.o. male.  HPI  Around noon developed severe abdominal pain. Diffuse, sharp and cramping. Associated emesis x5. No blood. Not passing flatus, no BM since abd pain started.  No urinarly symptoms nor fevers. Past Medical History:  Diagnosis Date  . Burkitt lymphoma ([place]) 08/15/2015  . Complication of anesthesia   . EBV infection 2013   Patient notes that he had an EBV infection in 2013 that left him with chronic fatigue. She also notes that he had significant lymphadenopathy and thyroiditis at the time. He has been managing his symptoms with an alternative medicine practitioner in [place] and with other alternative medicines.  . Marijuana use, episodic   . SBO (small bowel obstruction)     Patient Active Problem List   Diagnosis Date Noted  . Chemotherapy induced nausea and vomiting   . Burkitt lymphoma of intra-abdominal lymph nodes ([place])   . Burkitt's lymphoma of lymph nodes of multiple regions ([place]) 12/03/2015  . Acute sinusitis 11/20/2015  . Generalized abdominal pain   . Leukocytosis 10/23/2015  . SBO (small bowel obstruction)   . Abdominal pain   . Dehydration   . Oral thrush   . Encounter for chemotherapy management   . Gastritis and gastroduodenitis   . Cephalalgia   . Uncontrollable vomiting   . Constipation   . Cerebrospinal fluid leak from spinal puncture   . Spinal headache   . Burkitt lymphoma ([place]) 08/15/2015  . Burkitt lymphoma of lymph nodes of multiple regions ([place])   . Chemotherapy management, encounter for   . CN (constipation)   . Dyspnea   . Encounter for antineoplastic chemotherapy 07/25/2015  . Burkitt's lymphoma ([place]) 07/24/2015  . Acute respiratory failure with hypoxemia ([place])   . Acute respiratory failure with hypoxia ([place])  07/20/2015  . ARF (acute renal failure) ([place])   . Elevated LFTs   . Lymphoma ([place])   . Abdominal distension   . AKI (acute kidney injury) ([place])   . Ascites   . Mass   . Spontaneous tumor lysis syndrome   . Pleural effusion 07/15/2015  . Pleural effusion, right 07/14/2015  . Chronic fatigue syndrome 09/23/2012    Past Surgical History:  Procedure Laterality Date  . APPENDECTOMY    . LAPAROSCOPY N/A 07/20/2015   Procedure: LAPAROSCOPY DIAGNOSTIC, MULTIPLE BIOPSIES, DRAINAGE OF ASCITES;  Surgeon: Fanny Skates, MD;  Location: [facility];  Service: General;  Laterality: N/A;  . TONSILLECTOMY         Home Medications    Prior to Admission medications   Not on File    Family History Family History  Problem Relation Age of Onset  . Heart failure Father     Social History Social History  Substance Use Topics  . Smoking status: Never Smoker  . Smokeless tobacco: Never Used  . Alcohol use No     Allergies   Levaquin [levofloxacin] and Sulfa antibiotics   Review of Systems Review of Systems  Constitutional: Negative for fever.  HENT: Negative for sore throat.   Eyes: Negative for visual disturbance.  Respiratory: Negative for shortness of breath.   Cardiovascular: Negative for chest pain.  Gastrointestinal: Positive for abdominal pain, constipation, nausea and vomiting.  Genitourinary: Negative for difficulty urinating.  Musculoskeletal: Negative for back  pain and neck stiffness.  Skin: Negative for rash.  Neurological: Negative for syncope and headaches.     Physical Exam Updated Vital Signs BP 115/63 (BP Location: Left Arm)   Pulse (!) 51   Temp 97.9 F (36.6 C) (Oral)   Resp 16   Ht 6' (1.829 m)   Wt 190 lb (86.2 kg)   SpO2 97%   BMI 25.77 kg/m   Physical Exam  Constitutional: He is oriented to person, place, and time. He appears well-developed and well-nourished. He appears distressed (pain).  HENT:  Head: Normocephalic and atraumatic.  Eyes:  Conjunctivae and EOM are normal.  Neck: Normal range of motion.  Cardiovascular: Normal rate, regular rhythm, normal heart sounds and intact distal pulses.  Exam reveals no gallop and no friction rub.   No murmur heard. Pulmonary/Chest: Effort normal and breath sounds normal. No respiratory distress. He has no wheezes. He has no rales.  Abdominal: Soft. He exhibits distension. There is tenderness (diffuse). There is no guarding.  Musculoskeletal: He exhibits no edema.  Neurological: He is alert and oriented to person, place, and time.  Skin: Skin is warm and dry. He is not diaphoretic.  Nursing note and vitals reviewed.    ED Treatments / Results  Labs (all labs ordered are listed, but only abnormal results are displayed) Labs Reviewed  COMPREHENSIVE METABOLIC PANEL - Abnormal; Notable for the following:       Result Value   Glucose, Bld 110 (*)    All other components within normal limits  CBC - Abnormal; Notable for the following:    WBC 13.3 (*)    All other components within normal limits  URINALYSIS, ROUTINE W REFLEX MICROSCOPIC - Abnormal; Notable for the following:    Specific Gravity, Urine >1.046 (*)    All other components within normal limits  LIPASE, BLOOD  PROTIME-INR  APTT  HIV ANTIBODY (ROUTINE TESTING)  BASIC METABOLIC PANEL  CBC  TYPE AND SCREEN  ABO/RH    EKG  EKG Interpretation None       Radiology Ct Abdomen Pelvis W Contrast  Result Date: 01/14/2017 CLINICAL DATA:  Abdominal pain, similar to previous bowel obstructions. EXAM: CT ABDOMEN AND PELVIS WITH CONTRAST TECHNIQUE: Multidetector CT imaging of the abdomen and pelvis was performed using the standard protocol following bolus administration of intravenous contrast. CONTRAST:  179ml ISOVUE-300 IOPAMIDOL (ISOVUE-300) INJECTION 61% COMPARISON:  08/09/2016, 01/07/2016 FINDINGS: Lower chest: No acute abnormality. Hepatobiliary: No focal liver abnormality is seen. No gallstones, gallbladder wall  thickening, or biliary dilatation. Pancreas: Unremarkable. No pancreatic ductal dilatation or surrounding inflammatory changes. Spleen: Normal in size without focal abnormality. Adrenals/Urinary Tract: Both adrenals are normal. No significant renal parenchymal lesions. Probable 2 mm calculus in the lower pole right renal collecting system, coronal image 105 series 3. Otherwise normal appearances of the collecting systems and ureters. Urinary bladder is unremarkable. Stomach/Bowel: Stomach is unremarkable. There is abnormal dilatation and mural edema of small bowel, with adjacent mesenteric edema. Abrupt caliber transition to decompressed ileum in the anterior abdomen, without evidence of mass or hernia. This may represent adhesions. Colon is unremarkable. Vascular/Lymphatic: No significant vascular findings are present. No enlarged abdominal or pelvic lymph nodes. Reproductive: Unremarkable Other: Small volume ascites, predominantly in the right lower quadrant adjacent to obstructed small bowel. Musculoskeletal: No significant skeletal lesion. IMPRESSION: 1. Moderately high-grade small bowel obstruction, probably due to adhesions. Transition point is in the anterior abdomen to the right of midline at the level of the umbilicus. 2.  No other acute findings. 3. Probable lower pole right nephrolithiasis, nonobstructing. Electronically Signed   By: Andreas Newport M.D.   On: 01/14/2017 23:44    Procedures Procedures (including critical care time)  Medications Ordered in ED Medications  diatrizoate meglumine-sodium (GASTROGRAFIN) 66-10 % solution 90 mL (0 mLs Per NG tube Hold 01/15/17 0119)  Benzocaine (HURRCAINE) 20 % mouth spray (not administered)  HYDROmorphone (DILAUDID) injection 1 mg (1 mg Intravenous Given 01/15/17 0116)  ondansetron (ZOFRAN) injection 4 mg (not administered)  0.9 %  sodium chloride infusion ( Intravenous New Bag/Given 01/15/17 0119)  acetaminophen (TYLENOL) tablet 650 mg (not  administered)    Or  acetaminophen (TYLENOL) suppository 650 mg (not administered)  zolpidem (AMBIEN) tablet 5 mg (not administered)  ondansetron (ZOFRAN-ODT) disintegrating tablet 4 mg (4 mg Oral Given 01/14/17 2122)  sodium chloride 0.9 % bolus 1,000 mL (0 mLs Intravenous Stopped 01/14/17 2359)  HYDROmorphone (DILAUDID) injection 1 mg (1 mg Intravenous Given 01/14/17 2251)  iopamidol (ISOVUE-300) 61 % injection 100 mL (100 mLs Intravenous Contrast Given 01/14/17 2305)  ondansetron (ZOFRAN) injection 4 mg (4 mg Intravenous Given 01/14/17 2359)     Initial Impression / Assessment and Plan / ED Course  I have reviewed the triage vital signs and the nursing notes.  Pertinent labs & imaging results that were available during my care of the patient were reviewed by me and considered in my medical decision making (see chart for details).     41yo male with history of Burkitt's lymphoma, prior SBO secondary to adhesions who presents with concern for abdominal pain, nausea and vomiting.  CT shows high grade bowel obstruction. Small bowel protocol ordered. Dr. Johney Maine consulted and Surgery to evaluate tomorrow. Pt admitted to hospitalist service.   Final Clinical Impressions(s) / ED Diagnoses   Final diagnoses:  Encounter for imaging study to confirm nasogastric (NG) tube placement  Small bowel obstruction    New Prescriptions There are no discharge medications for this patient.    Gareth Morgan, MD 01/15/17 [phone]

## 2017-01-15 NOTE — Progress Notes (Signed)
NGT unable to be placed in ED.  The patient states he does not wish any additional attempts at insertion at this time.  Instructed to call for increased pain or N/V.  Patient acknowledges u/o the above.

## 2017-01-15 NOTE — Progress Notes (Addendum)
PROGRESS NOTE  Donald [name]  ION:[id number] DOB: 1976/03/07 DOA: 01/14/2017 PCP: Drema Pry, DO  Brief Narrative:  Donald [name] is a 41 y.o. male with medical history significant of Burkitt's lymphoma (s/p of surgery and chemotherapy), prior SBO secondary to adhesions, EBV, marijuana abuse, who presents with nausea, vomiting, abdominal pain.  CT confirmed a moderately high grade SBO.  Symptoms resolved overnight.  NG unable to be placed.  Diet advancing this morning.  Appreciate general surgery assistance.    Assessment & Plan:   Principal Problem:   SBO (small bowel obstruction) Active Problems:   Burkitt's lymphoma (HCC)  SBO (small bowel obstruction): CT-abdomen/pelvis showed moderately high-grade small bowel obstruction with transition point in the anterior abdomen to the right of midline at the level of the umbilicus.  -  CLD -  Small bowel follow through pain -  Dilaudid prn pain -Prn Zofran prn nausea  - continue IVF -  Appreciate general surgery assistance.    Hx of  Burkitt's lymphoma [facility]): s/p of surgery and chemotherapy. Following up with Dr. Irene Limbo. Last seen on 01/05/17.  -stable per dr. Grier Mitts note.  Irregular heart beat -  ECG:  NSR/mild sinus bradycardia -  Suspect PACs or PVCs -  Has distant history of cocaine use  THC use.   - UDS positive for THC  DVT ppx: SCD Code Status: Full code Family Communication: None at bed side.   Disposition Plan:  Anticipate discharge back to previous home environment   Consultants:   General surgery  Procedures:  none  Antimicrobials:  Anti-infectives    None       Subjective: Feeling completely better today and would like to go home.  Denies abdominal pain, nausea, cramping.  Passing flatus.  No BMs.    Objective: Vitals:   01/15/17 0057 01/15/17 0151 01/15/17 0640 01/15/17 1413  BP: (!) 130/102 115/63 (!) 110/56 131/60  Pulse: 68 (!) 51 (!) 50 (!) 52  Resp: 16 16 16 15   Temp:  97.9 F (36.6 C) 98.8 F  (37.1 C) 99.4 F (37.4 C)  TempSrc:  Oral Oral Oral  SpO2: 98% 97% 97% 98%  Weight:      Height:        Intake/Output Summary (Last 24 hours) at 01/15/17 1737 Last data filed at 01/15/17 1414  Gross per 24 hour  Intake           825.42 ml  Output             1450 ml  Net          -624.58 ml   Filed Weights   01/14/17 2253  Weight: 86.2 kg (190 lb)    Examination:  General exam:  Adult male.  No acute distress.  HEENT:  NCAT, MMM Respiratory system: Clear to auscultation bilaterally Cardiovascular system:  Frequent premature beats during exam. normal S1/S2. No murmurs, rubs, gallops or clicks.  Warm extremities Gastrointestinal system:  Hypoactive and high pitched BS, moderately distended and TTP in the umbilical area with a fullness in same area.  No rebound or guarding MSK:  Normal tone and bulk, no lower extremity edema Neuro:  Grossly intact    Data Reviewed: I have personally reviewed following labs and imaging studies  CBC:  Recent Labs Lab 01/14/17 2127 01/15/17 0215  WBC 13.3* 11.8*  HGB 15.6 14.5  HCT 45.9 42.8  MCV 86.9 87.3  PLT 223 244   Basic Metabolic Panel:  Recent Labs Lab 01/14/17 2127  01/15/17 0215  NA 137 138  K 3.7 4.0  CL 103 106  CO2 25 26  GLUCOSE 110* 119*  BUN 17 16  CREATININE 0.91 0.80  CALCIUM 9.2 8.5*   GFR: Estimated Creatinine Clearance: 132 mL/min (by C-G formula based on SCr of 0.8 mg/dL). Liver Function Tests:  Recent Labs Lab 01/14/17 2127  AST 26  ALT 30  ALKPHOS 61  BILITOT 0.8  PROT 7.1  ALBUMIN 4.5    Recent Labs Lab 01/14/17 2127  LIPASE 27   No results for input(s): AMMONIA in the last 168 hours. Coagulation Profile:  Recent Labs Lab 01/15/17 0227  INR 1.07   Cardiac Enzymes: No results for input(s): CKTOTAL, CKMB, CKMBINDEX, TROPONINI in the last 168 hours. BNP (last 3 results) No results for input(s): PROBNP in the last 8760 hours. HbA1C: No results for input(s): HGBA1C in the last  72 hours. CBG:  Recent Labs Lab 01/15/17 0844  GLUCAP 83   Lipid Profile: No results for input(s): CHOL, HDL, LDLCALC, TRIG, CHOLHDL, LDLDIRECT in the last 72 hours. Thyroid Function Tests: No results for input(s): TSH, T4TOTAL, FREET4, T3FREE, THYROIDAB in the last 72 hours. Anemia Panel: No results for input(s): VITAMINB12, FOLATE, FERRITIN, TIBC, IRON, RETICCTPCT in the last 72 hours. Urine analysis:    Component Value Date/Time   COLORURINE YELLOW 01/14/2017 2354   APPEARANCEUR CLEAR 01/14/2017 2354   LABSPEC >1.046 (H) 01/14/2017 2354   PHURINE 6.0 01/14/2017 2354   GLUCOSEU NEGATIVE 01/14/2017 2354   HGBUR NEGATIVE 01/14/2017 2354   BILIRUBINUR NEGATIVE 01/14/2017 2354   KETONESUR NEGATIVE 01/14/2017 2354   PROTEINUR NEGATIVE 01/14/2017 2354   UROBILINOGEN 0.2 07/26/2015 1627   NITRITE NEGATIVE 01/14/2017 2354   LEUKOCYTESUR NEGATIVE 01/14/2017 2354   Sepsis Labs: @LABRCNTIP (procalcitonin:4,lacticidven:4)  )No results found for this or any previous visit (from the past 240 hour(s)).    Radiology Studies: Ct Abdomen Pelvis W Contrast  Result Date: 01/14/2017 CLINICAL DATA:  Abdominal pain, similar to previous bowel obstructions. EXAM: CT ABDOMEN AND PELVIS WITH CONTRAST TECHNIQUE: Multidetector CT imaging of the abdomen and pelvis was performed using the standard protocol following bolus administration of intravenous contrast. CONTRAST:  156ml ISOVUE-300 IOPAMIDOL (ISOVUE-300) INJECTION 61% COMPARISON:  08/09/2016, 01/07/2016 FINDINGS: Lower chest: No acute abnormality. Hepatobiliary: No focal liver abnormality is seen. No gallstones, gallbladder wall thickening, or biliary dilatation. Pancreas: Unremarkable. No pancreatic ductal dilatation or surrounding inflammatory changes. Spleen: Normal in size without focal abnormality. Adrenals/Urinary Tract: Both adrenals are normal. No significant renal parenchymal lesions. Probable 2 mm calculus in the lower pole right renal  collecting system, coronal image 105 series 3. Otherwise normal appearances of the collecting systems and ureters. Urinary bladder is unremarkable. Stomach/Bowel: Stomach is unremarkable. There is abnormal dilatation and mural edema of small bowel, with adjacent mesenteric edema. Abrupt caliber transition to decompressed ileum in the anterior abdomen, without evidence of mass or hernia. This may represent adhesions. Colon is unremarkable. Vascular/Lymphatic: No significant vascular findings are present. No enlarged abdominal or pelvic lymph nodes. Reproductive: Unremarkable Other: Small volume ascites, predominantly in the right lower quadrant adjacent to obstructed small bowel. Musculoskeletal: No significant skeletal lesion. IMPRESSION: 1. Moderately high-grade small bowel obstruction, probably due to adhesions. Transition point is in the anterior abdomen to the right of midline at the level of the umbilicus. 2. No other acute findings. 3. Probable lower pole right nephrolithiasis, nonobstructing. Electronically Signed   By: Andreas Newport M.D.   On: 01/14/2017 23:44     Scheduled  Meds: . bisacodyl  10 mg Rectal Daily  . lip balm  1 application Topical BID   Continuous Infusions: . sodium chloride 125 mL/hr at 01/15/17 1249     LOS: 0 days    Time spent: 30 min    Janece Canterbury, MD [facility] Pager [phone]  If 7PM-7AM, please contact night-coverage [email or web address] Password [facility] 01/15/2017, 5:37 PM

## 2017-01-16 DIAGNOSIS — F1210 Cannabis abuse, uncomplicated: Secondary | ICD-10-CM

## 2017-01-16 DIAGNOSIS — I499 Cardiac arrhythmia, unspecified: Secondary | ICD-10-CM

## 2017-01-16 LAB — BASIC METABOLIC PANEL
ANION GAP: 4 — AB (ref 5–15)
BUN: 12 mg/dL (ref 6–20)
CALCIUM: 8.1 mg/dL — AB (ref 8.9–10.3)
CO2: 25 mmol/L (ref 22–32)
CREATININE: 0.92 mg/dL (ref 0.61–1.24)
Chloride: 109 mmol/L (ref 101–111)
Glucose, Bld: 86 mg/dL (ref 65–99)
Potassium: 3.9 mmol/L (ref 3.5–5.1)
Sodium: 138 mmol/L (ref 135–145)

## 2017-01-16 LAB — HIV ANTIBODY (ROUTINE TESTING W REFLEX): HIV SCREEN 4TH GENERATION: NONREACTIVE

## 2017-01-16 NOTE — Discharge Summary (Signed)
Physician Discharge Summary  Arkin Imran IRC:[id number] DOB: Jun 28, 1976 DOA: 01/14/2017  PCP: Drema Pry, DO  Admit date: 01/14/2017 Discharge date: 01/16/2017  Admitted From: home  Disposition:  home  Recommendations for Outpatient Follow-up:  1. Follow up with General Surgery in 1-2 weeks  Home Health:  none  Equipment/Devices:  None  Discharge Condition:  Stable, improved CODE STATUS:  Full code  Diet recommendation:  Soft, low residue   Brief/Interim Summary:  Davidson Palmieri a 41 y.o.malewith medical history significant of Burkitt's lymphoma (s/p of surgery and chemotherapy), prior SBO secondary to adhesions, EBV, marijuana abuse, who presented with nausea, vomiting, abdominal pain.  CT confirmed a moderately high grade SBO.  NG could not be placed secondary to nasal deviation.  Nausea and abdominal pain improved within 24 hours and he started passing flatus.  He had a BM the following day.  Follow up XR demonstrated contrast within the colon but was concerning for persistently dilated small bowel loops with some wall edema.  He continued to have some central abdominal bloating or discomfort but not pain.  He declined surgery but understands that he will likely need surgery to prevent recurrent SBOs.  He will follow up with surgery as an outpatient to discuss his options.  He should gradually advance to a low residue diet prior to discharge.    Discharge Diagnoses:  Principal Problem:   SBO (small bowel obstruction) Active Problems:   Burkitt's lymphoma (HCC)   Irregular heart beat   Marijuana abuse   SBO (small bowel obstruction): CT-abdomen/pelvis showed moderately high-grade small bowel obstruction with transition point in the anterior abdomen to the right of midline at the level of the umbilicus.  -  KUB:  Contrast in the colon but persistently dilated loops of small bowel -  Patient feeling better with decreased nausea, improved abdominal pain, and return of flatus/BMs -   Patient requesting discharge today and voices understanding that he continues to have evidence of bowel obstruction on XR and will likely need surgery to correct this issue.  He will return to the hospital if he has recurrent nausea, vomiting, abdominal pain -  Appreciate general surgery assistance > follow up as outpatient  Hx of Burkitt's lymphoma [facility]): s/p of surgery and chemotherapy. Following up with Dr. Irene Limbo. Last seen on 01/05/17.  Irregular heart beat -  ECG:  NSR/mild sinus bradycardia -  Suspect PACs or PVCs -  Has distant history of cocaine use  THC use.   - UDS positive for THC, advised to stop using marijuana  Discharge Instructions  Discharge Instructions    Call MD for:  persistant nausea and vomiting    Complete by:  As directed    Call MD for:  severe uncontrolled pain    Complete by:  As directed    Discharge instructions    Complete by:  As directed    Please gradually add more solid foods back into your diet.  Start with bland, low fiber foods like white rice, white bread, yogurts.  Please try to minimize high fiber foods such as salads, beans, corn, grapes until you have an opportunity to follow up with general surgery.  Use miralax per bottle instructions to prevent constipation.  Return to the hospital if your symptoms recur.   Increase activity slowly    Complete by:  As directed        Medication List    You have not been prescribed any medications.    Follow-up Information  TOTH Joneen Boers, MD. Call.   Specialty:  General Surgery Contact information: [address] [place] [address] [phone]        Drema Pry, DO Follow up.   Specialty:  Internal Medicine Why:  as needed Contact information: [address] [place] [address] [phone]          Allergies  Allergen Reactions  . Levaquin [Levofloxacin] Other (See Comments)    Patient prefers not to take fluoroquinolones. He denies having any  allergic reaction to them.  . Sulfa Antibiotics Other (See Comments)    Unknown, Patient was a child and does not remember the reaction    Consultations: General Surgery   Procedures/Studies: Ct Abdomen Pelvis W Contrast  Result Date: 01/14/2017 CLINICAL DATA:  Abdominal pain, similar to previous bowel obstructions. EXAM: CT ABDOMEN AND PELVIS WITH CONTRAST TECHNIQUE: Multidetector CT imaging of the abdomen and pelvis was performed using the standard protocol following bolus administration of intravenous contrast. CONTRAST:  169ml ISOVUE-300 IOPAMIDOL (ISOVUE-300) INJECTION 61% COMPARISON:  08/09/2016, 01/07/2016 FINDINGS: Lower chest: No acute abnormality. Hepatobiliary: No focal liver abnormality is seen. No gallstones, gallbladder wall thickening, or biliary dilatation. Pancreas: Unremarkable. No pancreatic ductal dilatation or surrounding inflammatory changes. Spleen: Normal in size without focal abnormality. Adrenals/Urinary Tract: Both adrenals are normal. No significant renal parenchymal lesions. Probable 2 mm calculus in the lower pole right renal collecting system, coronal image 105 series 3. Otherwise normal appearances of the collecting systems and ureters. Urinary bladder is unremarkable. Stomach/Bowel: Stomach is unremarkable. There is abnormal dilatation and mural edema of small bowel, with adjacent mesenteric edema. Abrupt caliber transition to decompressed ileum in the anterior abdomen, without evidence of mass or hernia. This may represent adhesions. Colon is unremarkable. Vascular/Lymphatic: No significant vascular findings are present. No enlarged abdominal or pelvic lymph nodes. Reproductive: Unremarkable Other: Small volume ascites, predominantly in the right lower quadrant adjacent to obstructed small bowel. Musculoskeletal: No significant skeletal lesion. IMPRESSION: 1. Moderately high-grade small bowel obstruction, probably due to adhesions. Transition point is in the anterior  abdomen to the right of midline at the level of the umbilicus. 2. No other acute findings. 3. Probable lower pole right nephrolithiasis, nonobstructing. Electronically Signed   By: Andreas Newport M.D.   On: 01/14/2017 23:44   Dg Abd Portable 1v-small Bowel Obstruction Protocol-initial, 8 Hr Delay  Result Date: 01/15/2017 CLINICAL DATA:  Small bowel obstruction 8 hour delay EXAM: PORTABLE ABDOMEN - 1 VIEW COMPARISON:  CT 01/14/2017 FINDINGS: Gaseous dilatation of small bowel within the right and left abdomen, measuring up to 3.1 cm. Contrast is present within the colon. Calcified pelvic phleboliths. IMPRESSION: Persistent gaseous dilatation of small bowel in the right and left abdomen consistent with bowel obstruction; there is contrast material within the colon. Electronically Signed   By: Donavan Foil M.D.   On: 01/15/2017 23:43   Subjective: Feeling well.  Had a BM (contrast material from CT) and still passing flatus.  Denies nausea and abdominal pains but still has some soreness or abdominal discomfort in the mid-abdomen.  Tolerating clear liquid diet.  Asking to be discharged right away.    Discharge Exam: Vitals:   01/15/17 2152 01/16/17 0523  BP: 126/69 131/62  Pulse: (!) 49 (!) 51  Resp: 16 16  Temp: 98.6 F (37 C) 98.2 F (36.8 C)   Vitals:   01/15/17 0640 01/15/17 1413 01/15/17 2152 01/16/17 0523  BP: (!) 110/56 131/60 126/69 131/62  Pulse: (!) 50 (!)  52 (!) 49 (!) 51  Resp: 16 15 16 16   Temp: 98.8 F (37.1 C) 99.4 F (37.4 C) 98.6 F (37 C) 98.2 F (36.8 C)  TempSrc: Oral Oral Oral Oral  SpO2: 97% 98% 98% 93%  Weight:      Height:       General exam:  Adult male.  No acute distress.  HEENT:  NCAT, MMM Respiratory system: Clear to auscultation bilaterally Cardiovascular system:  RRR. normal S1/S2. No murmurs, rubs, gallops or clicks.  Warm extremities Gastrointestinal system:  Normal active BS and lower pitched and more frequent compared to yesterday.  Mildly  distended and still has a full feeling in the mid abdomen with discomfort but not pain with palpation MSK:  Normal tone and bulk, no lower extremity edema Neuro:  Grossly intact     The results of significant diagnostics from this hospitalization (including imaging, microbiology, ancillary and laboratory) are listed below for reference.     Microbiology: No results found for this or any previous visit (from the past 240 hour(s)).   Labs: BNP (last 3 results) No results for input(s): BNP in the last 8760 hours. Basic Metabolic Panel:  Recent Labs Lab 01/14/17 2127 01/15/17 0215 01/16/17 0441  NA 137 138 138  K 3.7 4.0 3.9  CL 103 106 109  CO2 25 26 25   GLUCOSE 110* 119* 86  BUN 17 16 12   CREATININE 0.91 0.80 0.92  CALCIUM 9.2 8.5* 8.1*   Liver Function Tests:  Recent Labs Lab 01/14/17 2127  AST 26  ALT 30  ALKPHOS 61  BILITOT 0.8  PROT 7.1  ALBUMIN 4.5    Recent Labs Lab 01/14/17 2127  LIPASE 27   No results for input(s): AMMONIA in the last 168 hours. CBC:  Recent Labs Lab 01/14/17 2127 01/15/17 0215  WBC 13.3* 11.8*  HGB 15.6 14.5  HCT 45.9 42.8  MCV 86.9 87.3  PLT 223 219   Cardiac Enzymes: No results for input(s): CKTOTAL, CKMB, CKMBINDEX, TROPONINI in the last 168 hours. BNP: Invalid input(s): POCBNP CBG:  Recent Labs Lab 01/15/17 0844  GLUCAP 83   D-Dimer No results for input(s): DDIMER in the last 72 hours. Hgb A1c No results for input(s): HGBA1C in the last 72 hours. Lipid Profile No results for input(s): CHOL, HDL, LDLCALC, TRIG, CHOLHDL, LDLDIRECT in the last 72 hours. Thyroid function studies No results for input(s): TSH, T4TOTAL, T3FREE, THYROIDAB in the last 72 hours.  Invalid input(s): FREET3 Anemia work up No results for input(s): VITAMINB12, FOLATE, FERRITIN, TIBC, IRON, RETICCTPCT in the last 72 hours. Urinalysis    Component Value Date/Time   COLORURINE YELLOW 01/14/2017 2354   APPEARANCEUR CLEAR 01/14/2017  2354   LABSPEC >1.046 (H) 01/14/2017 2354   PHURINE 6.0 01/14/2017 2354   GLUCOSEU NEGATIVE 01/14/2017 2354   HGBUR NEGATIVE 01/14/2017 2354   BILIRUBINUR NEGATIVE 01/14/2017 2354   KETONESUR NEGATIVE 01/14/2017 2354   PROTEINUR NEGATIVE 01/14/2017 2354   UROBILINOGEN 0.2 07/26/2015 1627   NITRITE NEGATIVE 01/14/2017 2354   LEUKOCYTESUR NEGATIVE 01/14/2017 2354   Sepsis Labs Invalid input(s): PROCALCITONIN,  WBC,  LACTICIDVEN   Time coordinating discharge: Over 30 minutes  SIGNED:   Janece Canterbury, MD  [facility] 01/16/2017, 11:05 AM Pager   If 7PM-7AM, please contact night-coverage [email or web address] Password [email or web address]

## 2017-01-16 NOTE — Progress Notes (Signed)
Patient ID: Donald [name], male   DOB: 1976-08-21, 41 y.o.   MRN: [id number]  [facility] Surgery Progress Note     Subjective: Feeling better this morning. XR last night showed contrast in the colon. States that he had a small BM yesterday. He has passed flatus this morning. Tolerated regular food last night. Asking to go home.  Objective: Vital signs in last 24 hours: Temp:  [98.2 F (36.8 C)-99.4 F (37.4 C)] 98.2 F (36.8 C) (03/09 0523) Pulse Rate:  [49-52] 51 (03/09 0523) Resp:  [15-16] 16 (03/09 0523) BP: (126-131)/(60-69) 131/62 (03/09 0523) SpO2:  [93 %-98 %] 93 % (03/09 0523) Last BM Date: 01/15/17  Intake/Output from previous day: 03/08 0701 - 03/09 0700 In: 4497.1 [P.O.:700; I.V.:2297.1; IV Piggyback:1500] Out: 1700 [Urine:1700] Intake/Output this shift: No intake/output data recorded.  PE: Gen:  Alert, NAD, pleasant Pulm:  CTAB, no W/R/R, effort normal Abd: Soft, mild distension, +BS, no HSM, NT Ext:  No erythema, edema, or tenderness   Lab Results:   Recent Labs  01/14/17 2127 01/15/17 0215  WBC 13.3* 11.8*  HGB 15.6 14.5  HCT 45.9 42.8  PLT 223 219   BMET  Recent Labs  01/15/17 0215 01/16/17 0441  NA 138 138  K 4.0 3.9  CL 106 109  CO2 26 25  GLUCOSE 119* 86  BUN 16 12  CREATININE 0.80 0.92  CALCIUM 8.5* 8.1*   PT/INR  Recent Labs  01/15/17 0227  LABPROT 13.9  INR 1.07   CMP     Component Value Date/Time   NA 138 01/16/2017 0441   NA 139 01/05/2017 1249   K 3.9 01/16/2017 0441   K 4.1 01/05/2017 1249   CL 109 01/16/2017 0441   CO2 25 01/16/2017 0441   CO2 27 01/05/2017 1249   GLUCOSE 86 01/16/2017 0441   GLUCOSE 96 01/05/2017 1249   BUN 12 01/16/2017 0441   BUN 16.3 01/05/2017 1249   CREATININE 0.92 01/16/2017 0441   CREATININE 1.1 01/05/2017 1249   CALCIUM 8.1 (L) 01/16/2017 0441   CALCIUM 9.1 01/05/2017 1249   PROT 7.1 01/14/2017 2127   PROT 7.0 01/05/2017 1249   ALBUMIN 4.5 01/14/2017 2127   ALBUMIN 4.2  01/05/2017 1249   AST 26 01/14/2017 2127   AST 32 01/05/2017 1249   ALT 30 01/14/2017 2127   ALT 48 01/05/2017 1249   ALKPHOS 61 01/14/2017 2127   ALKPHOS 67 01/05/2017 1249   BILITOT 0.8 01/14/2017 2127   BILITOT 0.41 01/05/2017 1249   GFRNONAA >60 01/16/2017 0441   GFRAA >60 01/16/2017 0441   Lipase     Component Value Date/Time   LIPASE 27 01/14/2017 2127       Studies/Results: Ct Abdomen Pelvis W Contrast  Result Date: 01/14/2017 CLINICAL DATA:  Abdominal pain, similar to previous bowel obstructions. EXAM: CT ABDOMEN AND PELVIS WITH CONTRAST TECHNIQUE: Multidetector CT imaging of the abdomen and pelvis was performed using the standard protocol following bolus administration of intravenous contrast. CONTRAST:  126ml ISOVUE-300 IOPAMIDOL (ISOVUE-300) INJECTION 61% COMPARISON:  08/09/2016, 01/07/2016 FINDINGS: Lower chest: No acute abnormality. Hepatobiliary: No focal liver abnormality is seen. No gallstones, gallbladder wall thickening, or biliary dilatation. Pancreas: Unremarkable. No pancreatic ductal dilatation or surrounding inflammatory changes. Spleen: Normal in size without focal abnormality. Adrenals/Urinary Tract: Both adrenals are normal. No significant renal parenchymal lesions. Probable 2 mm calculus in the lower pole right renal collecting system, coronal image 105 series 3. Otherwise normal appearances of the collecting systems and ureters.  Urinary bladder is unremarkable. Stomach/Bowel: Stomach is unremarkable. There is abnormal dilatation and mural edema of small bowel, with adjacent mesenteric edema. Abrupt caliber transition to decompressed ileum in the anterior abdomen, without evidence of mass or hernia. This may represent adhesions. Colon is unremarkable. Vascular/Lymphatic: No significant vascular findings are present. No enlarged abdominal or pelvic lymph nodes. Reproductive: Unremarkable Other: Small volume ascites, predominantly in the right lower quadrant adjacent  to obstructed small bowel. Musculoskeletal: No significant skeletal lesion. IMPRESSION: 1. Moderately high-grade small bowel obstruction, probably due to adhesions. Transition point is in the anterior abdomen to the right of midline at the level of the umbilicus. 2. No other acute findings. 3. Probable lower pole right nephrolithiasis, nonobstructing. Electronically Signed   By: Andreas Newport M.D.   On: 01/14/2017 23:44   Dg Abd Portable 1v-small Bowel Obstruction Protocol-initial, 8 Hr Delay  Result Date: 01/15/2017 CLINICAL DATA:  Small bowel obstruction 8 hour delay EXAM: PORTABLE ABDOMEN - 1 VIEW COMPARISON:  CT 01/14/2017 FINDINGS: Gaseous dilatation of small bowel within the right and left abdomen, measuring up to 3.1 cm. Contrast is present within the colon. Calcified pelvic phleboliths. IMPRESSION: Persistent gaseous dilatation of small bowel in the right and left abdomen consistent with bowel obstruction; there is contrast material within the colon. Electronically Signed   By: Donavan Foil M.D.   On: 01/15/2017 23:43    Anti-infectives: Anti-infectives    None       Assessment/Plan Recurrent SBO - XR 3/8 showed contrast in colon - small BM yesterday, +flatus today - tolerated diet  Plan - Bowel function returning. Patient is ok for discharge from a surgical standpoint. He will follow-up with Dr. Marlou Starks outpatient.    LOS: 1 day    Jerrye Beavers , [facility] Surgery 01/16/2017, 9:24 AM Pager: [phone] Consults: [phone] Mon-Fri 7:00 am-4:30 pm Sat-Sun 7:00 am-11:30 am

## 2017-01-22 ENCOUNTER — Telehealth: Payer: Self-pay | Admitting: Hematology

## 2017-01-22 NOTE — Telephone Encounter (Signed)
Spoke with patient and confirmed appointments and told him they were on my chart as well

## 2017-04-02 ENCOUNTER — Other Ambulatory Visit (HOSPITAL_BASED_OUTPATIENT_CLINIC_OR_DEPARTMENT_OTHER): Payer: BLUE CROSS/BLUE SHIELD

## 2017-04-02 ENCOUNTER — Ambulatory Visit (HOSPITAL_BASED_OUTPATIENT_CLINIC_OR_DEPARTMENT_OTHER): Payer: BLUE CROSS/BLUE SHIELD | Admitting: Hematology

## 2017-04-02 VITALS — BP 134/81 | HR 61 | Temp 98.0°F | Resp 18 | Ht 72.0 in | Wt 209.0 lb

## 2017-04-02 DIAGNOSIS — Z8572 Personal history of non-Hodgkin lymphomas: Secondary | ICD-10-CM | POA: Diagnosis not present

## 2017-04-02 DIAGNOSIS — C91A1 Mature B-cell leukemia Burkitt-type, in remission: Secondary | ICD-10-CM

## 2017-04-02 LAB — COMPREHENSIVE METABOLIC PANEL
ALBUMIN: 4.3 g/dL (ref 3.5–5.0)
ALK PHOS: 60 U/L (ref 40–150)
ALT: 24 U/L (ref 0–55)
ANION GAP: 7 meq/L (ref 3–11)
AST: 24 U/L (ref 5–34)
BUN: 20.7 mg/dL (ref 7.0–26.0)
CALCIUM: 9.4 mg/dL (ref 8.4–10.4)
CO2: 25 mEq/L (ref 22–29)
Chloride: 108 mEq/L (ref 98–109)
Creatinine: 1.1 mg/dL (ref 0.7–1.3)
EGFR: 86 mL/min/{1.73_m2} — ABNORMAL LOW (ref >90)
Glucose: 87 mg/dl (ref 70–140)
Potassium: 4.1 mEq/L (ref 3.5–5.1)
Sodium: 140 mEq/L (ref 136–145)
TOTAL PROTEIN: 6.9 g/dL (ref 6.4–8.3)
Total Bilirubin: 0.71 mg/dL (ref 0.20–1.20)

## 2017-04-02 LAB — CBC & DIFF AND RETIC
BASO%: 0.5 % (ref 0.0–2.0)
BASOS ABS: 0 10*3/uL (ref 0.0–0.1)
EOS ABS: 0.2 10*3/uL (ref 0.0–0.5)
EOS%: 2.3 % (ref 0.0–7.0)
HCT: 42 % (ref 38.4–49.9)
HEMOGLOBIN: 13.9 g/dL (ref 13.0–17.1)
IMMATURE RETIC FRACT: 3.4 % (ref 3.00–10.60)
LYMPH#: 1.9 10*3/uL (ref 0.9–3.3)
LYMPH%: 29.1 % (ref 14.0–49.0)
MCH: 29.6 pg (ref 27.2–33.4)
MCHC: 33.1 g/dL (ref 32.0–36.0)
MCV: 89.4 fL (ref 79.3–98.0)
MONO#: 0.7 10*3/uL (ref 0.1–0.9)
MONO%: 10.6 % (ref 0.0–14.0)
NEUT#: 3.7 10*3/uL (ref 1.5–6.5)
NEUT%: 57.5 % (ref 39.0–75.0)
PLATELETS: 186 10*3/uL (ref 140–400)
RBC: 4.7 10*6/uL (ref 4.20–5.82)
RDW: 13 % (ref 11.0–14.6)
RETIC CT ABS: 58.75 10*3/uL (ref 34.80–93.90)
Retic %: 1.25 % (ref 0.80–1.80)
WBC: 6.4 10*3/uL (ref 4.0–10.3)

## 2017-04-02 LAB — LACTATE DEHYDROGENASE: LDH: 226 U/L (ref 125–245)

## 2017-04-03 ENCOUNTER — Telehealth: Payer: Self-pay | Admitting: Hematology

## 2017-04-03 NOTE — Telephone Encounter (Signed)
Scheduled appt per 5/24 LOS - unable to reach patient - sent reminder letter in the mail.

## 2017-04-08 ENCOUNTER — Telehealth: Payer: Self-pay | Admitting: *Deleted

## 2017-04-08 NOTE — Progress Notes (Signed)
[name]    HEMATOLOGY/ONCOLOGY CLINIC NOTE  Date of Service: 04/02/2017  Patient Care Team: Rosine Abe, DO as PCP - General (Internal Medicine)  CHIEF COMPLAINTS/PURPOSE OF CONSULTATION:   follow-up for Burkitt's lymphoma  Diagnosis: Stage III Burkitt's lymphoma without CNS involvement diagnosed in September 2016.  TREATMENT EPOCH-R + CNS prophylaxis as per NEJM 2013 Dunleavy et al. S/p 6 cycles of EPOCH-R completed 12/17/2015.  s/p 4 doses of intrathecal methotrexate plus hydrocortisone for CNS prophylaxis  INTERVAL HISTORY  [name] is is here for his scheduled 3 month clinic follow-up for his Burkitt's lymphoma.  He is now more than 1 year out after completing his planned treatment for Burkitts lymphoma. He has been feeling well and has recently started cycling again. He was recently admitted to the hospital on 01/14/2017 with a second bout of partial small bowel obstruction thought to be related to adhesions. CT had shown a moderately high-grade small bowel obstruction. Improved with conservative management and notes that he has been able to eat well and prefers to avoid surgical intervention at this time.  No chest pain no shortness of breath. No palpable new enlarged lymph nodes. No other acute new focal symptoms. Labs are WNL and LDH is normal.  MEDICAL HISTORY:   Past Medical History:  Diagnosis Date  . Burkitt lymphoma ([place]) 08/15/2015  . Complication of anesthesia   . EBV infection 2013   Patient notes that he had an EBV infection in 2013 that left him with chronic fatigue. She also notes that he had significant lymphadenopathy and thyroiditis at the time. He has been managing his symptoms with an alternative medicine practitioner in [place] and with other alternative medicines.  . Marijuana use, episodic   . SBO (small bowel obstruction)     SURGICAL HISTORY: Past Surgical History:  Procedure Laterality Date  . APPENDECTOMY    . LAPAROSCOPY N/A  07/20/2015   Procedure: LAPAROSCOPY DIAGNOSTIC, MULTIPLE BIOPSIES, DRAINAGE OF ASCITES;  Surgeon: Fanny Skates, MD;  Location: [facility];  Service: General;  Laterality: N/A;  . TONSILLECTOMY      SOCIAL HISTORY: Social History   Social History  . Marital status: Married    Spouse name: N/A  . Number of children: N/A  . Years of education: N/A   Occupational History  . Not on file.   Social History Main Topics  . Smoking status: Never Smoker  . Smokeless tobacco: Never Used  . Alcohol use No  . Drug use: Yes    Frequency: 1.0 time per week    Types: Marijuana  . Sexual activity: Yes   Other Topics Concern  . 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   He has a fiance and has been in a steady relationship.      He has tried to maintain a very healthy lifestyle and cycles about 100 miles a week. He also uses a fair number of over-the-counter alternative medicines to stay healthy.    FAMILY HISTORY: Family History  Problem Relation Age of Onset  . Heart failure Father     ALLERGIES:  is allergic to levaquin [levofloxacin] and sulfa antibiotics.  MEDICATIONS:  No current outpatient prescriptions on file.   No current facility-administered medications for this visit.    Facility-Administered Medications Ordered in Other Visits  Medication Dose Route Frequency Provider Last Rate Last Dose  . heparin lock flush 100  unit/mL  500 Units Intravenous Once Brunetta Genera, MD      . sodium chloride flush (NS) 0.9 % injection 10 mL  10 mL Intravenous Once Brunetta Genera, MD        REVIEW OF SYSTEMS:    10 Point review of Systems was done is negative except as noted above.  PHYSICAL EXAMINATION: ECOG PERFORMANCE STATUS:0 VS reviewed and are stable  GENERAL:alert, mild emotional distress, feeling somewhat overwhelmed with the treatment. SKIN: skin color, texture,  turgor are normal, no rashes or significant lesions EYES: normal, conjunctiva are pink and non-injected, sclera clear OROPHARYNX:no exudate, no erythema and lips, buccal mucosa, and tongue normal , coated tongue.  No sinus pain or tenderness.  No significant pharyngeal erythema or exudates. NECK: supple, no JVD, thyroid normal size, non-tender, without nodularity LYMPH:  no palpable lymphadenopathy in the cervical, axillary or inguinal LUNGS: clear to auscultation with normal respiratory effort. HEART: regular rate & rhythm,  no murmurs and no lower extremity edema ABDOMEN: abdomen soft, non-tender, normoactive bowel sounds ,No palpable hepatosplenomegaly.   Musculoskeletal: no cyanosis of digits and no clubbing  PSYCH: alert & oriented x 3 with fluent speech NEURO: no focal motor/sensory deficits  LABORATORY DATA:  I have reviewed the data as listed  . CBC Latest Ref Rng & Units 04/02/2017 01/15/2017 01/14/2017  WBC 4.0 - 10.3 10e3/uL 6.4 11.8(H) 13.3(H)  Hemoglobin 13.0 - 17.1 g/dL 13.9 14.5 15.6  Hematocrit 38.4 - 49.9 % 42.0 42.8 45.9  Platelets 140 - 400 10e3/uL 186 219 223    . CMP Latest Ref Rng & Units 04/02/2017 01/16/2017 01/15/2017  Glucose 70 - 140 mg/dl 87 86 119(H)  BUN 7.0 - 26.0 mg/dL 20.7 12 16   Creatinine 0.7 - 1.3 mg/dL 1.1 0.92 0.80  Sodium 136 - 145 mEq/L 140 138 138  Potassium 3.5 - 5.1 mEq/L 4.1 3.9 4.0  Chloride 101 - 111 mmol/L - 109 106  CO2 22 - 29 mEq/L 25 25 26   Calcium 8.4 - 10.4 mg/dL 9.4 8.1(L) 8.5(L)  Total Protein 6.4 - 8.3 g/dL 6.9 - -  Total Bilirubin 0.20 - 1.20 mg/dL 0.71 - -  Alkaline Phos 40 - 150 U/L 60 - -  AST 5 - 34 U/L 24 - -  ALT 0 - 55 U/L 24 - -   . Lab Results  Component Value Date   LDH 226 04/02/2017    RADIOGRAPHIC STUDIES: No new imaging  ASSESSMENT & PLAN:   Patient is a 41 yo male admitted with   1) High risk Burkitt's lymphoma stage III with no CNS involvement -currently in remission.  Cytogenetics showed Myc Break   apart event associated with chromosomal variants of Burkitt's lymphoma t(2;8) and t(8;22). Patient is status post 6 cycles of EPOCH-R and received 4 doses of intrathecal methotrexate for CNS prophylaxis. That PET CT scan performed 01/07/2016 after completion of planned treatment showed no evidence of residual hypermetabolic disease.  On clinic visit today  patient has no clinical evidence of disease progression at this time. Labs are stable.  LDH drawn today WNL  He is now out about 15 months since completion of his treatment. Plan -Patient has been doing well and reports no clinical symptoms suggestive of disease recurrence at this time. -no indication for additional treatment of his Burkitt's lymphoma at this time. -He was encouraged to maintain an active and healthy lifestyle.  -he is aware of the symptoms of lymphoma recurrence and has been counseled to call  us immediately if any new questions or concerns arise .  2) recent episode of small bowel obstruction likely due to adhesions. Resolved with conservative management. Plan -Patient chose not to follow-up with surgery as outpatient and prefers to avoid surgical intervention at this time after understanding the pros and cons. -He understands that he is at high risk for recurrent symptoms of small bowel obstruction -He notes he has been working to keep himself well-hydrated and avoiding excessively high fiber diet  Return to care With Dr. Irene Limbo in 3 months with CBC, CMP, LDH. Earlier if any other acute new concerns.  I spent 20 minutes counseling the patient face to face. The total time spent in the appointment was 25 minutes and more than 50% was on counseling and direct patient cares.    Sullivan Lone MD [place] AAHIVMS [facility] Hematology/Oncology Physician [place]  (Office):       [phone] (Work cell):  [phone] (Fax):           [phone]

## 2017-04-08 NOTE — Telephone Encounter (Signed)
-----   Message from Brunetta Genera, MD sent at 04/08/2017  8:13 AM EDT ----- Plz let Donald [name] know that his LDH level are within normal limits  thx [place]

## 2017-04-08 NOTE — Telephone Encounter (Signed)
Per staff message, informed patient of information below. Patient verbalized understanding.

## 2017-07-02 ENCOUNTER — Encounter: Payer: Self-pay | Admitting: Hematology

## 2017-07-02 ENCOUNTER — Ambulatory Visit (HOSPITAL_BASED_OUTPATIENT_CLINIC_OR_DEPARTMENT_OTHER): Payer: BLUE CROSS/BLUE SHIELD | Admitting: Hematology

## 2017-07-02 ENCOUNTER — Other Ambulatory Visit (HOSPITAL_BASED_OUTPATIENT_CLINIC_OR_DEPARTMENT_OTHER): Payer: BLUE CROSS/BLUE SHIELD

## 2017-07-02 VITALS — BP 132/87 | HR 54 | Temp 98.7°F | Resp 20 | Ht 72.0 in | Wt 206.9 lb

## 2017-07-02 DIAGNOSIS — C8378 Burkitt lymphoma, lymph nodes of multiple sites: Secondary | ICD-10-CM

## 2017-07-02 DIAGNOSIS — Z8572 Personal history of non-Hodgkin lymphomas: Secondary | ICD-10-CM | POA: Diagnosis not present

## 2017-07-02 DIAGNOSIS — C91A1 Mature B-cell leukemia Burkitt-type, in remission: Secondary | ICD-10-CM

## 2017-07-02 LAB — CBC & DIFF AND RETIC
BASO%: 0.3 % (ref 0.0–2.0)
Basophils Absolute: 0 10*3/uL (ref 0.0–0.1)
EOS%: 2.1 % (ref 0.0–7.0)
Eosinophils Absolute: 0.1 10*3/uL (ref 0.0–0.5)
HCT: 41.6 % (ref 38.4–49.9)
HGB: 14 g/dL (ref 13.0–17.1)
Immature Retic Fract: 3.4 % (ref 3.00–10.60)
LYMPH%: 31.1 % (ref 14.0–49.0)
MCH: 29.7 pg (ref 27.2–33.4)
MCHC: 33.7 g/dL (ref 32.0–36.0)
MCV: 88.3 fL (ref 79.3–98.0)
MONO#: 0.5 10*3/uL (ref 0.1–0.9)
MONO%: 8.7 % (ref 0.0–14.0)
NEUT%: 57.8 % (ref 39.0–75.0)
NEUTROS ABS: 3.6 10*3/uL (ref 1.5–6.5)
PLATELETS: 165 10*3/uL (ref 140–400)
RBC: 4.71 10*6/uL (ref 4.20–5.82)
RDW: 12.5 % (ref 11.0–14.6)
Retic %: 0.93 % (ref 0.80–1.80)
Retic Ct Abs: 43.8 10*3/uL (ref 34.80–93.90)
WBC: 6.2 10*3/uL (ref 4.0–10.3)
lymph#: 1.9 10*3/uL (ref 0.9–3.3)

## 2017-07-02 LAB — COMPREHENSIVE METABOLIC PANEL
ALBUMIN: 3.9 g/dL (ref 3.5–5.0)
ALK PHOS: 60 U/L (ref 40–150)
ALT: 21 U/L (ref 0–55)
ANION GAP: 6 meq/L (ref 3–11)
AST: 22 U/L (ref 5–34)
BUN: 16.5 mg/dL (ref 7.0–26.0)
CO2: 30 mEq/L — ABNORMAL HIGH (ref 22–29)
Calcium: 9.4 mg/dL (ref 8.4–10.4)
Chloride: 104 mEq/L (ref 98–109)
Creatinine: 1 mg/dL (ref 0.7–1.3)
GLUCOSE: 96 mg/dL (ref 70–140)
POTASSIUM: 4 meq/L (ref 3.5–5.1)
SODIUM: 139 meq/L (ref 136–145)
Total Bilirubin: 0.49 mg/dL (ref 0.20–1.20)
Total Protein: 6.6 g/dL (ref 6.4–8.3)

## 2017-07-02 LAB — LACTATE DEHYDROGENASE: LDH: 170 U/L (ref 125–245)

## 2017-07-02 NOTE — Patient Instructions (Signed)
Thank you for choosing [facility] to provide your oncology and hematology care.  To afford each patient quality time with our providers, please arrive 30 minutes before your scheduled appointment time.  If you arrive late for your appointment, you may be asked to reschedule.  We strive to give you quality time with our providers, and arriving late affects you and other patients whose appointments are after yours.   If you are a no show for multiple scheduled visits, you may be dismissed from the clinic at the providers discretion.    Again, thank you for choosing [facility], our hope is that these requests will decrease the amount of time that you wait before being seen by our physicians.  ______________________________________________________________________  Should you have questions after your visit to the [facility], please contact our office at [phone] between the hours of 8:30 and 4:30 p.m.    Voicemails left after 4:30p.m will not be returned until the following business day.    For prescription refill requests, please have your pharmacy contact us directly.  Please also try to allow 48 hours for prescription requests.    Please contact the scheduling department for questions regarding scheduling.  For scheduling of procedures such as PET scans, CT scans, MRI, Ultrasound, etc please contact central scheduling at [phone].    Resources For Cancer Patients and Caregivers:   Oncolink.org:  A wonderful resource for patients and healthcare providers for information regarding your disease, ways to tract your treatment, what to expect, etc.     American Cancer Society:  800-227-2345  Can help patients locate various types of support and financial assistance  Cancer Care: 1-800-813-HOPE (4673) Provides financial assistance, online support groups, medication/co-pay assistance.    [place] DSS:  336-641-3447 Where to apply for food  stamps, Medicaid, and utility assistance  Medicare Rights Center: 800-333-4114 Helps people with Medicare understand their rights and benefits, navigate the Medicare system, and secure the quality healthcare they deserve  SCAT: 336-333-6589 [place] [employer]'s shared-ride transportation service for eligible riders who have a disability that prevents them from riding the fixed route bus.    For additional information on assistance programs please contact our social worker:   Grier Hock/Abigail Elmore:  [phone]            

## 2017-07-03 ENCOUNTER — Telehealth: Payer: Self-pay | Admitting: Hematology

## 2017-07-03 NOTE — Telephone Encounter (Signed)
Gave patient AVS report and calendar of upcoming November appointments.

## 2017-07-03 NOTE — Telephone Encounter (Signed)
Spoke to patient regarding upcoming November appointments. °

## 2017-07-08 NOTE — Progress Notes (Signed)
Donald [name] Kitchen    HEMATOLOGY/ONCOLOGY CLINIC NOTE  Date of Service: 07/02/2017  Patient Care Team: Rosine Abe, DO as PCP - General (Internal Medicine)  CHIEF COMPLAINTS/PURPOSE OF CONSULTATION:   follow-up for Burkitt's lymphoma  Diagnosis: Stage III Burkitt's lymphoma without CNS involvement diagnosed in September 2016.  TREATMENT EPOCH-R + CNS prophylaxis as per NEJM 2013 Dunleavy et al. S/p 6 cycles of EPOCH-R completed 12/17/2015.  s/p 4 doses of intrathecal methotrexate plus hydrocortisone for CNS prophylaxis  INTERVAL HISTORY  Donald [name] is is here for his scheduled 3 month clinic follow-up for his Burkitt's lymphoma.  He is now more than 18 months out after completing his planned treatment for Burkitts lymphoma. He has been feeling well and has been trying to keep physically active and has started a new business.  He was recently admitted to the hospital in [place] while on vacation in July for symptoms of partial SBO (recurrent symptoms) -- was given surgical referral and was to be seen at [facility] surgery for this but missed his appointment -- he notes that he will reschedule this appointment for consideration of surgical treatment to correct his recurrent SBO  No chest pain no shortness of breath. No palpable new enlarged lymph nodes. No other acute new focal symptoms. Labs are WNL and LDH is normal.  MEDICAL HISTORY:   Past Medical History:  Diagnosis Date  . Burkitt lymphoma ([place]) 08/15/2015  . Complication of anesthesia   . EBV infection 2013   Patient notes that he had an EBV infection in 2013 that left him with chronic fatigue. She also notes that he had significant lymphadenopathy and thyroiditis at the time. He has been managing his symptoms with an alternative medicine practitioner in [place] and with other alternative medicines.  . Marijuana use, episodic   . SBO (small bowel obstruction) ([place])     SURGICAL HISTORY: Past Surgical History:    Procedure Laterality Date  . APPENDECTOMY    . LAPAROSCOPY N/A 07/20/2015   Procedure: LAPAROSCOPY DIAGNOSTIC, MULTIPLE BIOPSIES, DRAINAGE OF ASCITES;  Surgeon: Fanny Skates, MD;  Location: [facility];  Service: General;  Laterality: N/A;  . TONSILLECTOMY      SOCIAL HISTORY: Social History   Social History  . Marital status: Married    Spouse name: N/A  . Number of children: N/A  . Years of education: N/A   Occupational History  . Not on file.   Social History Main Topics  . Smoking status: Never Smoker  . Smokeless tobacco: Never Used  . Alcohol use No  . Drug use: Yes    Frequency: 1.0 time per week    Types: Marijuana  . Sexual activity: Yes   Other Topics Concern  . 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   He has a fiance and has been in a steady relationship.      He has tried to maintain a very healthy lifestyle and cycles about 100 miles a week. He also uses a fair number of over-the-counter alternative medicines to stay healthy.    FAMILY HISTORY: Family History  Problem Relation Age of Onset  . Heart failure Father     ALLERGIES:  is allergic to levaquin [levofloxacin] and sulfa antibiotics.  MEDICATIONS:  No current outpatient prescriptions on file.   No current facility-administered medications for this visit.    Facility-Administered Medications Ordered in Other Visits  Medication Dose Route Frequency Provider Last Rate Last Dose  . heparin lock flush 100 unit/mL  500 Units Intravenous Once Brunetta Genera, MD      . sodium chloride flush (NS) 0.9 % injection 10 mL  10 mL Intravenous Once Brunetta Genera, MD        REVIEW OF SYSTEMS:    10 Point review of Systems was done is negative except as noted above.  PHYSICAL EXAMINATION: ECOG PERFORMANCE STATUS:0 VS reviewed and are stable  GENERAL:alert, mild emotional distress, feeling  somewhat overwhelmed with the treatment. SKIN: skin color, texture, turgor are normal, no rashes or significant lesions EYES: normal, conjunctiva are pink and non-injected, sclera clear OROPHARYNX:no exudate, no erythema and lips, buccal mucosa, and tongue normal , coated tongue.  No sinus pain or tenderness.  No significant pharyngeal erythema or exudates. NECK: supple, no JVD, thyroid normal size, non-tender, without nodularity LYMPH:  no palpable lymphadenopathy in the cervical, axillary or inguinal LUNGS: clear to auscultation with normal respiratory effort. HEART: regular rate & rhythm,  no murmurs and no lower extremity edema ABDOMEN: abdomen soft, non-tender, normoactive bowel sounds ,No palpable hepatosplenomegaly.   Musculoskeletal: no cyanosis of digits and no clubbing  PSYCH: alert & oriented x 3 with fluent speech NEURO: no focal motor/sensory deficits  LABORATORY DATA:  I have reviewed the data as listed  . CBC Latest Ref Rng & Units 07/02/2017 04/02/2017 01/15/2017  WBC 4.0 - 10.3 10e3/uL 6.2 6.4 11.8(H)  Hemoglobin 13.0 - 17.1 g/dL 14.0 13.9 14.5  Hematocrit 38.4 - 49.9 % 41.6 42.0 42.8  Platelets 140 - 400 10e3/uL 165 186 219    . CMP Latest Ref Rng & Units 07/02/2017 04/02/2017 01/16/2017  Glucose 70 - 140 mg/dl 96 87 86  BUN 7.0 - 26.0 mg/dL 16.5 20.7 12  Creatinine 0.7 - 1.3 mg/dL 1.0 1.1 0.92  Sodium 136 - 145 mEq/L 139 140 138  Potassium 3.5 - 5.1 mEq/L 4.0 4.1 3.9  Chloride 101 - 111 mmol/L - - 109  CO2 22 - 29 mEq/L 30(H) 25 25  Calcium 8.4 - 10.4 mg/dL 9.4 9.4 8.1(L)  Total Protein 6.4 - 8.3 g/dL 6.6 6.9 -  Total Bilirubin 0.20 - 1.20 mg/dL 0.49 0.71 -  Alkaline Phos 40 - 150 U/L 60 60 -  AST 5 - 34 U/L 22 24 -  ALT 0 - 55 U/L 21 24 -   . Lab Results  Component Value Date   LDH 170 07/02/2017    RADIOGRAPHIC STUDIES: No new imaging  ASSESSMENT & PLAN:   Patient is a 41 yo male admitted with   1) High risk Burkitt's lymphoma stage III with no CNS  involvement -currently in remission.  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 Patient is status post 6 cycles of EPOCH-R and received 4 doses of intrathecal methotrexate for CNS prophylaxis. That PET CT scan performed 01/07/2016 after completion of planned treatment showed no evidence of residual hypermetabolic disease.  On clinic visit today  patient has no clinical evidence of disease progression at this time. Labs are stable.  LDH drawn today WNL  He is now out about 18 months since completion of his treatment. Plan -Patient has been doing well and reports no clinical symptoms suggestive of disease recurrence at this time. -no indication for additional treatment of his Burkitt's lymphoma at this time. -He was encouraged to maintain an active and healthy lifestyle.  -  he is aware of the symptoms of lymphoma recurrence and has been counseled to call us immediately if any new questions or concerns arise .  2) recent repeat episode of small bowel obstruction likely due to adhesions. Resolved with conservative management. Plan -missed surgical appointment at [facility] surgery -he was strongly recommend to re-schedule f/u with [facility]. -He understands that he is at high risk for recurrent symptoms of small bowel obstruction -He notes he has been working to keep himself well-hydrated and avoiding excessively high fiber diet  Return to care With Dr. Irene Limbo in 3 months with CBC, CMP, LDH. Earlier if any other acute new concerns.  I spent 20 minutes counseling the patient face to face. The total time spent in the appointment was 25 minutes and more than 50% was on counseling and direct patient cares.    Sullivan Lone MD [place] AAHIVMS [facility] Hematology/Oncology Physician [place]  (Office):       [phone] (Work cell):  [phone] (Fax):           [phone]

## 2017-08-01 IMAGING — DX DG CHEST 1V PORT
1 series · 1 of 1 positions shown · non-contrast
Comparison: 07/20/2015.

CLINICAL DATA: Acute respiratory failure with hypoxemia. Nonsmoker.

EXAM:
PORTABLE CHEST - 1 VIEW

[chest ap]
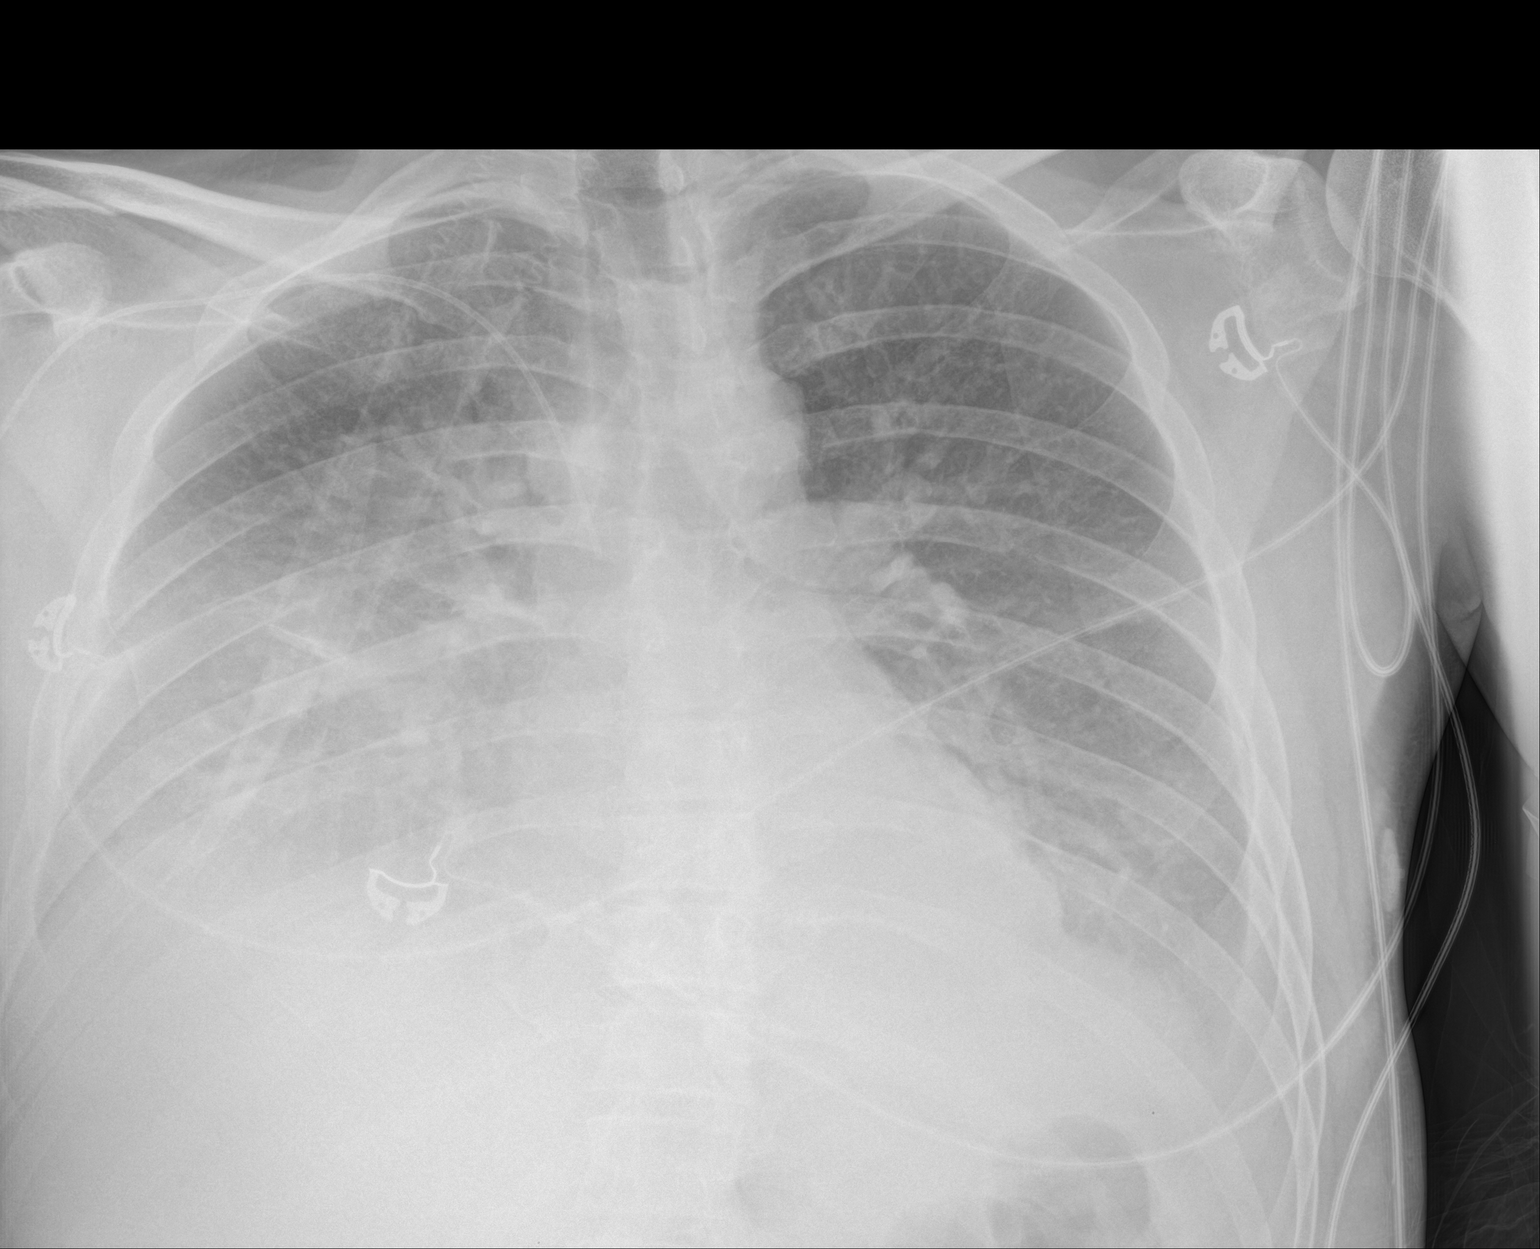

[1 of 1 positions shown; findings below may reference images not displayed]

FINDINGS: Cardiomediastinal silhouette appears within normal limits for size.
Diffuse pulmonary opacities persist but are slightly improved
consistent with resolving edema. Decreased BILATERAL pleural
effusions. PICC line tip SVC. No pneumothorax.
IMPRESSION: Improving pulmonary edema.

## 2017-08-04 IMAGING — CT CT BIOPSY
1 of 2 series · 6 of 10 positions shown, 11 images · non-contrast
Comparison: none

CLINICAL DATA: 38-year-old male with a new diagnosis of lymphoma.
TECHNIQUE: Informed consent was obtained from the patient following explanation
of the procedure, risks, benefits and alternatives. The patient
understands, agrees and consents for the procedure. All questions
were addressed. A time out was performed.

[Series 5: (hospital) 6.0 b30f · axial · 0.89mm/px · z∈[-67,-62]mm · 6 of 8 slices shown, 11 images]
[im 2/8  soft-tissue]
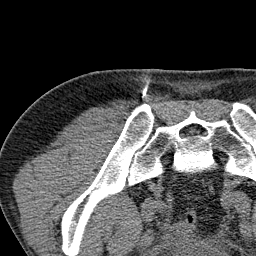
[im 2/8  lung]
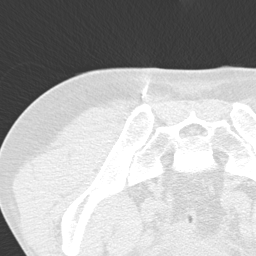
[im 2/8  bone]
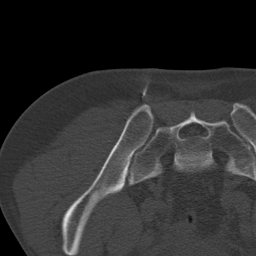
[im 3/8  soft-tissue]
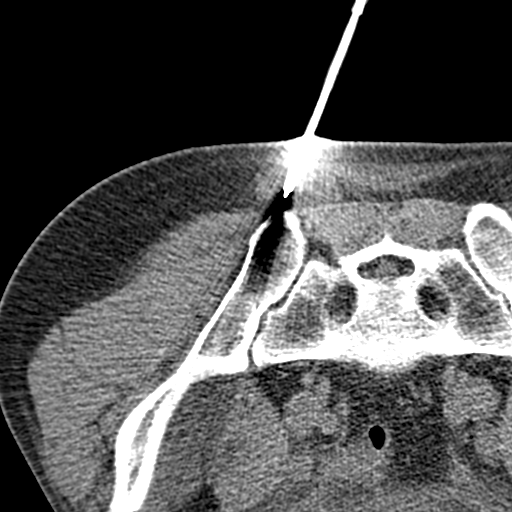
[im 4/8  soft-tissue]
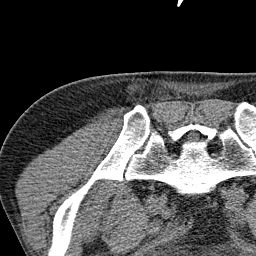
[im 5/8  soft-tissue]
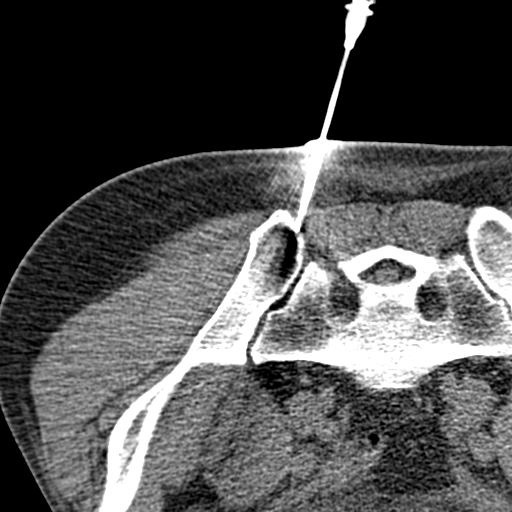
[im 5/8  lung]
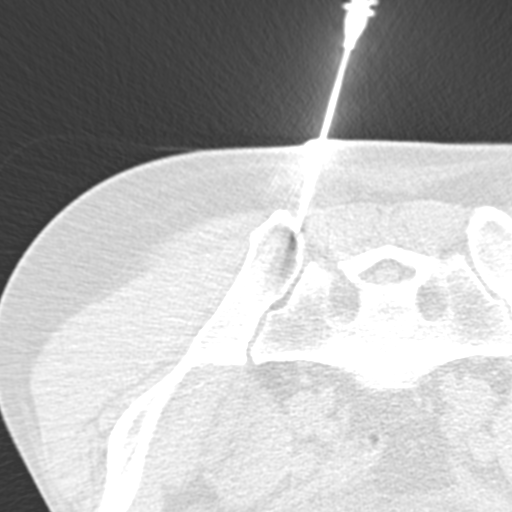
[im 6/8  soft-tissue]
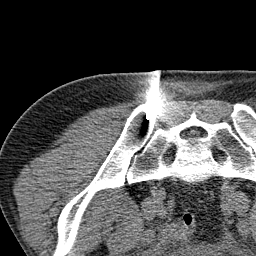
[im 6/8  lung]
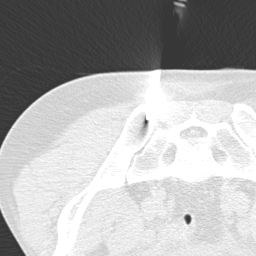
[im 7/8  soft-tissue]
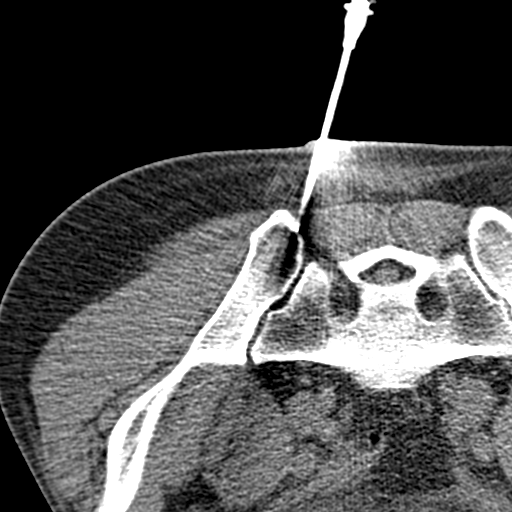
[im 7/8  lung]
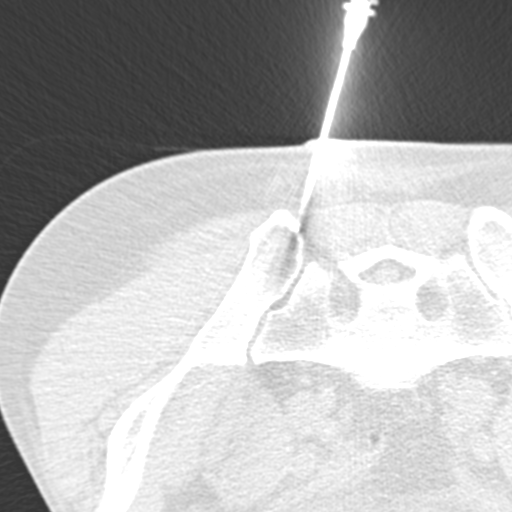

[6 of 10 positions shown; findings below may reference images not displayed]

EXAM:
CT BIOPSY

Date: 07/24/2015

PROCEDURE:
1. CT guided bone marrow aspiration and core biopsy

ANESTHESIA/SEDATION:
Moderate (conscious) sedation was used. 2.5 mg Versed, 100 mcg
Fentanyl were administered intravenously. The patient's vital signs
were monitored continuously by radiology nursing throughout the
procedure.

Sedation Time: 9 minutes

FLUOROSCOPY TIME:  None
The patient was positioned prone and noncontrast localization CT was
performed of the pelvis to demonstrate the iliac marrow spaces.

Maximal barrier sterile technique utilized including caps, mask,
sterile gowns, sterile gloves, large sterile drape, hand hygiene,
and betadine prep.

Under sterile conditions and local anesthesia, an 11 gauge coaxial
bone biopsy needle was advanced into the right iliac marrow space.
Needle position was confirmed with CT imaging. Initially, bone
marrow aspiration was performed. Next, the 11 gauge outer cannula
was utilized to obtain a right iliac bone marrow core biopsy. Needle
was removed. Hemostasis was obtained with compression. The patient
tolerated the procedure well. Samples were prepared with the
cytotechnologist. No immediate complications.
IMPRESSION: CT guided right iliac bone marrow aspiration and core biopsy. The
On-Control drill was used.

## 2017-09-30 ENCOUNTER — Ambulatory Visit: Payer: BLUE CROSS/BLUE SHIELD | Admitting: Hematology

## 2017-09-30 ENCOUNTER — Other Ambulatory Visit: Payer: BLUE CROSS/BLUE SHIELD

## 2017-10-05 ENCOUNTER — Encounter (INDEPENDENT_AMBULATORY_CARE_PROVIDER_SITE_OTHER): Payer: Self-pay

## 2017-10-05 ENCOUNTER — Telehealth: Payer: Self-pay | Admitting: Hematology

## 2017-10-05 NOTE — Telephone Encounter (Signed)
Spoke to patient regarding upcoming November appointments per 11/26 sch message.

## 2017-10-06 ENCOUNTER — Other Ambulatory Visit (HOSPITAL_BASED_OUTPATIENT_CLINIC_OR_DEPARTMENT_OTHER): Payer: BLUE CROSS/BLUE SHIELD

## 2017-10-06 ENCOUNTER — Ambulatory Visit (HOSPITAL_BASED_OUTPATIENT_CLINIC_OR_DEPARTMENT_OTHER): Payer: BLUE CROSS/BLUE SHIELD | Admitting: Hematology

## 2017-10-06 ENCOUNTER — Encounter: Payer: Self-pay | Admitting: Hematology

## 2017-10-06 ENCOUNTER — Telehealth: Payer: Self-pay | Admitting: Hematology

## 2017-10-06 VITALS — BP 147/73 | HR 60 | Temp 98.8°F | Resp 16 | Wt 204.9 lb

## 2017-10-06 DIAGNOSIS — C8378 Burkitt lymphoma, lymph nodes of multiple sites: Secondary | ICD-10-CM

## 2017-10-06 DIAGNOSIS — C91A1 Mature B-cell leukemia Burkitt-type, in remission: Secondary | ICD-10-CM

## 2017-10-06 DIAGNOSIS — Z8572 Personal history of non-Hodgkin lymphomas: Secondary | ICD-10-CM

## 2017-10-06 LAB — CBC & DIFF AND RETIC
BASO%: 0.2 % (ref 0.0–2.0)
BASOS ABS: 0 10*3/uL (ref 0.0–0.1)
EOS ABS: 0.1 10*3/uL (ref 0.0–0.5)
EOS%: 1.5 % (ref 0.0–7.0)
HEMATOCRIT: 42.4 % (ref 38.4–49.9)
HEMOGLOBIN: 14.3 g/dL (ref 13.0–17.1)
IMMATURE RETIC FRACT: 3.7 % (ref 3.00–10.60)
LYMPH%: 23.6 % (ref 14.0–49.0)
MCH: 29.7 pg (ref 27.2–33.4)
MCHC: 33.7 g/dL (ref 32.0–36.0)
MCV: 88 fL (ref 79.3–98.0)
MONO#: 0.7 10*3/uL (ref 0.1–0.9)
MONO%: 8.4 % (ref 0.0–14.0)
NEUT#: 5.8 10*3/uL (ref 1.5–6.5)
NEUT%: 66.3 % (ref 39.0–75.0)
Platelets: 220 10*3/uL (ref 140–400)
RBC: 4.82 10*6/uL (ref 4.20–5.82)
RDW: 12.2 % (ref 11.0–14.6)
Retic %: 1.09 % (ref 0.80–1.80)
Retic Ct Abs: 52.54 10*3/uL (ref 34.80–93.90)
WBC: 8.8 10*3/uL (ref 4.0–10.3)
lymph#: 2.1 10*3/uL (ref 0.9–3.3)

## 2017-10-06 LAB — COMPREHENSIVE METABOLIC PANEL
ALBUMIN: 3.8 g/dL (ref 3.5–5.0)
ALK PHOS: 58 U/L (ref 40–150)
ALT: 21 U/L (ref 0–55)
AST: 18 U/L (ref 5–34)
Anion Gap: 9 mEq/L (ref 3–11)
BILIRUBIN TOTAL: 0.39 mg/dL (ref 0.20–1.20)
BUN: 18.6 mg/dL (ref 7.0–26.0)
CALCIUM: 9 mg/dL (ref 8.4–10.4)
CO2: 26 mEq/L (ref 22–29)
Chloride: 104 mEq/L (ref 98–109)
Creatinine: 0.9 mg/dL (ref 0.7–1.3)
Glucose: 84 mg/dl (ref 70–140)
POTASSIUM: 4.2 meq/L (ref 3.5–5.1)
Sodium: 139 mEq/L (ref 136–145)
TOTAL PROTEIN: 7 g/dL (ref 6.4–8.3)

## 2017-10-06 LAB — LACTATE DEHYDROGENASE: LDH: 205 U/L (ref 125–245)

## 2017-10-06 NOTE — Telephone Encounter (Signed)
Scheduled appt per 11/27 los - sent reminder letter in the mail with appt date and time. Lab and f/u in 3 months.

## 2017-10-06 NOTE — Patient Instructions (Signed)
Thank you for choosing [facility] to provide your oncology and hematology care.  To afford each patient quality time with our providers, please arrive 30 minutes before your scheduled appointment time.  If you arrive late for your appointment, you may be asked to reschedule.  We strive to give you quality time with our providers, and arriving late affects you and other patients whose appointments are after yours.   If you are a no show for multiple scheduled visits, you may be dismissed from the clinic at the providers discretion.    Again, thank you for choosing [facility], our hope is that these requests will decrease the amount of time that you wait before being seen by our physicians.  ______________________________________________________________________  Should you have questions after your visit to the [facility], please contact our office at [phone] between the hours of 8:30 and 4:30 p.m.    Voicemails left after 4:30p.m will not be returned until the following business day.    For prescription refill requests, please have your pharmacy contact us directly.  Please also try to allow 48 hours for prescription requests.    Please contact the scheduling department for questions regarding scheduling.  For scheduling of procedures such as PET scans, CT scans, MRI, Ultrasound, etc please contact central scheduling at [phone].    Resources For Cancer Patients and Caregivers:   Oncolink.org:  A wonderful resource for patients and healthcare providers for information regarding your disease, ways to tract your treatment, what to expect, etc.     American Cancer Society:  800-227-2345  Can help patients locate various types of support and financial assistance  Cancer Care: 1-800-813-HOPE (4673) Provides financial assistance, online support groups, medication/co-pay assistance.    [place] DSS:  336-641-3447 Where to apply for food  stamps, Medicaid, and utility assistance  Medicare Rights Center: 800-333-4114 Helps people with Medicare understand their rights and benefits, navigate the Medicare system, and secure the quality healthcare they deserve  SCAT: 336-333-6589 [place] [employer]'s shared-ride transportation service for eligible riders who have a disability that prevents them from riding the fixed route bus.    For additional information on assistance programs please contact our social worker:   Grier Hock/Abigail Elmore:  [phone]            

## 2017-10-08 NOTE — Progress Notes (Signed)
Donald Kitchen    HEMATOLOGY/ONCOLOGY CLINIC NOTE  Date of Service: 10/06/2017  Patient Care Team: Rosine Abe, DO as PCP - General (Internal Medicine)  CHIEF COMPLAINTS/PURPOSE OF CONSULTATION:   follow-up for Burkitt's lymphoma  Diagnosis: Stage III Burkitt's lymphoma without CNS involvement diagnosed in September 2016.  TREATMENT EPOCH-R + CNS prophylaxis as per NEJM 2013 Dunleavy et al. S/p 6 cycles of EPOCH-R completed 12/17/2015.  s/p 4 doses of intrathecal methotrexate plus hydrocortisone for CNS prophylaxis  INTERVAL HISTORY  [name] is is here for his scheduled 3 month clinic follow-up for his Burkitt's lymphoma.  He is now more than 21 months out after completing his planned treatment for Burkitts lymphoma.  He notes that he is doing well overall and has no acute new concerns.  No chest pain no shortness of breath. No palpable new enlarged lymph nodes. No other acute new focal symptoms. Labs are WNL and LDH is normal. No fevers/chills/night sweats/weight loss.  He notes no further symptoms suggestive of bowel obstruction similar to his previous and wants to hold off on surgical re-evaluation at this time.  MEDICAL HISTORY:   Past Medical History:  Diagnosis Date  . Burkitt lymphoma ([place]) 08/15/2015  . Complication of anesthesia   . EBV infection 2013   Patient notes that he had an EBV infection in 2013 that left him with chronic fatigue. She also notes that he had significant lymphadenopathy and thyroiditis at the time. He has been managing his symptoms with an alternative medicine practitioner in [place] and with other alternative medicines.  . Marijuana use, episodic   . SBO (small bowel obstruction) ([place])     SURGICAL HISTORY: Past Surgical History:  Procedure Laterality Date  . APPENDECTOMY    . LAPAROSCOPY N/A 07/20/2015   Procedure: LAPAROSCOPY DIAGNOSTIC, MULTIPLE BIOPSIES, DRAINAGE OF ASCITES;  Surgeon: Fanny Skates, MD;  Location: [facility];   Service: General;  Laterality: N/A;  . TONSILLECTOMY      SOCIAL HISTORY: Social History   Socioeconomic History  . Marital status: Married    Spouse name: Not on file  . Number of children: Not on file  . Years of education: Not on file  . Highest education level: Not on file  Social Needs  . Financial resource strain: Not on file  . Food insecurity - worry: Not on file  . Food insecurity - inability: Not on file  . Transportation needs - medical: Not on file  . Transportation needs - non-medical: Not on file  Occupational History  . Not on file  Tobacco Use  . Smoking status: Never Smoker  . Smokeless tobacco: Never Used  Substance and Sexual Activity  . Alcohol use: No    Alcohol/week: 0.0 oz  . Drug use: Yes    Frequency: 1.0 times per week    Types: Marijuana  . Sexual activity: Yes  Other Topics Concern  . 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   He has a fiance and has been in a steady relationship.      He has tried to maintain a very healthy lifestyle and cycles about 100 miles a week. He also uses a fair number of over-the-counter alternative medicines to stay healthy.    FAMILY HISTORY: Family History  Problem Relation Age of Onset  . Heart failure Father     ALLERGIES:  is allergic to levaquin [levofloxacin] and sulfa  antibiotics.  MEDICATIONS:  No current outpatient medications on file.   No current facility-administered medications for this visit.    Facility-Administered Medications Ordered in Other Visits  Medication Dose Route Frequency Provider Last Rate Last Dose  . heparin lock flush 100 unit/mL  500 Units Intravenous Once Brunetta Genera, MD      . sodium chloride flush (NS) 0.9 % injection 10 mL  10 mL Intravenous Once Brunetta Genera, MD        REVIEW OF SYSTEMS:    10 Point review of Systems was done is negative except as  noted above.  PHYSICAL EXAMINATION: ECOG PERFORMANCE STATUS:0 VS reviewed and are stable  GENERAL:alert, mild emotional distress, feeling somewhat overwhelmed with the treatment. SKIN: skin color, texture, turgor are normal, no rashes or significant lesions EYES: normal, conjunctiva are pink and non-injected, sclera clear OROPHARYNX:no exudate, no erythema and lips, buccal mucosa, and tongue normal , coated tongue.  No sinus pain or tenderness.  No significant pharyngeal erythema or exudates. NECK: supple, no JVD, thyroid normal size, non-tender, without nodularity LYMPH:  no palpable lymphadenopathy in the cervical, axillary or inguinal LUNGS: clear to auscultation with normal respiratory effort. HEART: regular rate & rhythm,  no murmurs and no lower extremity edema ABDOMEN: abdomen soft, non-tender, normoactive bowel sounds ,No palpable hepatosplenomegaly.   Musculoskeletal: no cyanosis of digits and no clubbing  PSYCH: alert & oriented x 3 with fluent speech NEURO: no focal motor/sensory deficits  LABORATORY DATA:  I have reviewed the data as listed  . CBC Latest Ref Rng & Units 10/06/2017 07/02/2017 04/02/2017  WBC 4.0 - 10.3 10e3/uL 8.8 6.2 6.4  Hemoglobin 13.0 - 17.1 g/dL 14.3 14.0 13.9  Hematocrit 38.4 - 49.9 % 42.4 41.6 42.0  Platelets 140 - 400 10e3/uL 220 165 186    . CMP Latest Ref Rng & Units 10/06/2017 07/02/2017 04/02/2017  Glucose 70 - 140 mg/dl 84 96 87  BUN 7.0 - 26.0 mg/dL 18.6 16.5 20.7  Creatinine 0.7 - 1.3 mg/dL 0.9 1.0 1.1  Sodium 136 - 145 mEq/L 139 139 140  Potassium 3.5 - 5.1 mEq/L 4.2 4.0 4.1  Chloride 101 - 111 mmol/L - - -  CO2 22 - 29 mEq/L 26 30(H) 25  Calcium 8.4 - 10.4 mg/dL 9.0 9.4 9.4  Total Protein 6.4 - 8.3 g/dL 7.0 6.6 6.9  Total Bilirubin 0.20 - 1.20 mg/dL 0.39 0.49 0.71  Alkaline Phos 40 - 150 U/L 58 60 60  AST 5 - 34 U/L 18 22 24   ALT 0 - 55 U/L 21 21 24    . Lab Results  Component Value Date   LDH 205 10/06/2017    RADIOGRAPHIC  STUDIES: No new imaging  ASSESSMENT & PLAN:   Patient is a 41 yo male admitted with   1) High risk Burkitt's lymphoma stage III with no CNS involvement -currently in remission.  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 Patient is status post 6 cycles of EPOCH-R and received 4 doses of intrathecal methotrexate for CNS prophylaxis. That PET CT scan performed 01/07/2016 after completion of planned treatment showed no evidence of residual hypermetabolic disease.  On clinic visit today  patient has no clinical evidence of disease progression at this time. Labs are stable.  LDH drawn today WNL  He is now out about 21 months since completion of his treatment. Plan -Patient has been doing well and reports no clinical symptoms suggestive  of disease recurrence at this time. -no indication for additional treatment of his Burkitt's lymphoma at this time. -He was encouraged to maintain an active and healthy lifestyle.  -he is aware of the symptoms of lymphoma recurrence and has been counseled to call us immediately if any new questions or concerns arise .  2) no recurrent  Symptoms suggestive of small bowel obstruction since his last clinic visit. Previously has had a few episodes of SBO likely due to adhesions which resolved with conservative management. Plan --He understands that he is at high risk for recurrent symptoms of small bowel obstruction -He notes he has been working to keep himself well-hydrated and avoiding excessively high fiber diet - declines to have a surgical evaluation at his time .  Return to care With Dr. Irene Limbo in 3 months with CBC, CMP, LDH. Earlier if any other acute new concerns.  I spent 20 minutes counseling the patient face to face. The total time spent in the appointment was 25 minutes and more than 50% was on counseling and direct patient cares.    Sullivan Lone MD [place] AAHIVMS [facility]  [facility] Hematology/Oncology Physician [place]  (Office):       [phone] (Work cell):  [phone] (Fax):           [phone]

## 2018-01-04 NOTE — Progress Notes (Signed)
Donald [name] Kitchen    HEMATOLOGY/ONCOLOGY CLINIC NOTE  Date of Service: 01/06/18  Donald [name] Care Team: Rosine Abe, DO as PCP - General (Internal Medicine)  CHIEF COMPLAINTS/PURPOSE OF CONSULTATION:   F/u for Burkitts lymphoma  Diagnosis: Stage III Burkitt's lymphoma without CNS involvement diagnosed in September 2016.  TREATMENT EPOCH-R + CNS prophylaxis as per NEJM 2013 Dunleavy et al. S/p 6 cycles of EPOCH-R completed 12/17/2015.  s/p 4 doses of intrathecal methotrexate plus hydrocortisone for CNS prophylaxis  INTERVAL HISTORY:  Donald [name] is is here for his scheduled 3 month clinic follow-up for his Burkitt's lymphoma.  Donald [name] is now 2 years out from completing his planned treatment for Burkitts lymphoma. The Donald [name]'s last visit with [place] was on 10/06/17. The pt reports that Donald [name] is doing well overall and has been enjoying time with his 4 kids. Donald [name] notes that his business is always busy but not exceptionally stressful. Donald [name] notes that Donald [name] does not take flu shots or pneumonia vaccines. We discussed that this would be recommended. Donald [name] notes that Donald [name] does not believe in vaccines and that Donald [name] has not vaccinated all of his children either.Donald [name] Kitchen Donald [name] continues to take multivitamin.   Donald [name] notes general discomfort in his abd at times but no overt symptoms of recurrent SBO. Donald [name] notes that Donald [name] has had a few viral URI as have other members in his family but is overcoming this.  Lab results today (01/06/18) of CBC, CMP, and Reticulocytes is as follows: all values are WNL.  LDH 01/06/18 is WNL at 198.  On review of systems, pt reports lower back pain and resolving URI symptoms. No nausea,vomiting, rashes, lower abdomen discomfort, and any other symptoms. No new lumps or bumps.  MEDICAL HISTORY:   Past Medical History:  Diagnosis Date  . Burkitt lymphoma ([place]) 08/15/2015  . Complication of anesthesia   . EBV infection 2013   Donald [name] notes that Donald [name] had an EBV infection in 2013 that left him with chronic fatigue. Donald [name] also  notes that Donald [name] had significant lymphadenopathy and thyroiditis at the time. Donald [name] has been managing his symptoms with an alternative medicine practitioner in [place] and with other alternative medicines.  . Marijuana use, episodic   . SBO (small bowel obstruction) ([place])     SURGICAL HISTORY: Past Surgical History:  Procedure Laterality Date  . APPENDECTOMY    . LAPAROSCOPY N/A 07/20/2015   Procedure: LAPAROSCOPY DIAGNOSTIC, MULTIPLE BIOPSIES, DRAINAGE OF ASCITES;  Surgeon: Fanny Skates, MD;  Location: [facility];  Service: General;  Laterality: N/A;  . TONSILLECTOMY      SOCIAL HISTORY: Social History   Socioeconomic History  . Marital status: Married    Spouse name: Not on file  . Number of children: Not on file  . Years of education: Not on file  . Highest education level: Not on file  Social Needs  . Financial resource strain: Not on file  . Food insecurity - worry: Not on file  . Food insecurity - inability: Not on file  . Transportation needs - medical: Not on file  . Transportation needs - non-medical: Not on file  Occupational History  . Not on file  Tobacco Use  . Smoking status: Never Smoker  . Smokeless tobacco: Never Used  Substance and Sexual Activity  . Alcohol use: No    Alcohol/week: 0.0 oz  . Drug use: Yes    Frequency: 1.0 times per week    Types: Marijuana  . Sexual activity: Yes  Other Topics Concern  .  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   Donald [name] has a fiance and has been in a steady relationship.      Donald [name] has tried to maintain a very healthy lifestyle and cycles about 100 miles a week. Donald [name] also uses a fair number of over-the-counter alternative medicines to stay healthy.    FAMILY HISTORY: Family History  Problem Relation Age of Onset  . Heart failure Father     ALLERGIES:  is allergic to levaquin [levofloxacin] and sulfa  antibiotics.  MEDICATIONS:  No current outpatient medications on file.   No current facility-administered medications for this visit.    Facility-Administered Medications Ordered in Other Visits  Medication Dose Route Frequency Provider Last Rate Last Dose  . heparin lock flush 100 unit/mL  500 Units Intravenous Once Brunetta Genera, MD      . sodium chloride flush (NS) 0.9 % injection 10 mL  10 mL Intravenous Once Brunetta Genera, MD        REVIEW OF SYSTEMS:    10 Point review of Systems was done is negative except as noted above.  PHYSICAL EXAMINATION: ECOG PERFORMANCE STATUS:0 .BP 138/80 (BP Location: Left Arm, Donald [name] Position: Sitting)   Pulse 60   Temp 98.6 F (37 C) (Oral)   Resp 18   Ht 6' (1.829 m)   Wt 208 lb 8 oz (94.6 kg)   SpO2 100%   BMI 28.28 kg/m    GENERAL:alert, mild emotional distress, feeling somewhat overwhelmed with the treatment. SKIN: skin color, texture, turgor are normal, no rashes or significant lesions EYES: normal, conjunctiva are pink and non-injected, sclera clear OROPHARYNX:no exudate, no erythema and lips, buccal mucosa, and tongue normal , coated tongue.  No sinus pain or tenderness.  No significant pharyngeal erythema or exudates. NECK: supple, no JVD, thyroid normal size, non-tender, without nodularity LYMPH:  no palpable lymphadenopathy in the cervical, axillary or inguinal LUNGS: clear to auscultation with normal respiratory effort. HEART: regular rate & rhythm,  no murmurs and no lower extremity edema ABDOMEN: abdomen soft, non-tender, normoactive bowel sounds ,No palpable hepatosplenomegaly.   Musculoskeletal: no cyanosis of digits and no clubbing  PSYCH: alert & oriented x 3 with fluent speech NEURO: no focal motor/sensory deficits  LABORATORY DATA:  I have reviewed the data as listed  . CBC Latest Ref Rng & Units 01/06/2018 10/06/2017 07/02/2017  WBC 4.0 - 10.3 K/uL 7.5 8.8 6.2  Hemoglobin 13.0 - 17.1 g/dL 14.4 14.3  14.0  Hematocrit 38.4 - 49.9 % 42.7 42.4 41.6  Platelets 140 - 400 K/uL 177 220 165    . CMP Latest Ref Rng & Units 01/06/2018 10/06/2017 07/02/2017  Glucose 70 - 140 mg/dL 92 84 96  BUN 7 - 26 mg/dL 17 18.6 16.5  Creatinine 0.70 - 1.30 mg/dL 0.85 0.9 1.0  Sodium 136 - 145 mmol/L 139 139 139  Potassium 3.5 - 5.1 mmol/L 3.8 4.2 4.0  Chloride 98 - 109 mmol/L 103 - -  CO2 22 - 29 mmol/L 25 26 30(H)  Calcium 8.4 - 10.4 mg/dL 9.4 9.0 9.4  Total Protein 6.4 - 8.3 g/dL 7.1 7.0 6.6  Total Bilirubin 0.2 - 1.2 mg/dL 0.3 0.39 0.49  Alkaline Phos 40 - 150 U/L 70 58 60  AST 5 - 34 U/L 21 18 22   ALT 0 - 55 U/L 27 21 21    . Lab Results  Component Value  Date   LDH 198 01/06/2018    RADIOGRAPHIC STUDIES: No new imaging  ASSESSMENT & PLAN:   Donald [name] is a 42 yo male with  1) High risk Burkitt's lymphoma stage III with no CNS involvement -currently in remission.  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 Donald [name] is status post 6 cycles of EPOCH-R and received 4 doses of intrathecal methotrexate for CNS prophylaxis. That PET CT scan performed 01/07/2016 after completion of planned treatment showed no evidence of residual hypermetabolic disease.  Plan -Donald [name] has been doing well and reports no clinical symptoms suggestive of disease recurrence at this time. Labs reviewed with the Donald [name]. LDH WNL. -Donald [name] is now 2 yrs post treatment completion and we discussed that this decreases his risk of Burkitts lymphoma recurrence significantly. -no indication for additional treatment of his Burkitt's lymphoma at this time. -Donald [name] was encouraged to maintain an active and healthy lifestyle.  -Donald [name] is aware of the symptoms of lymphoma recurrence and has been counseled to call us immediately if any new questions or concerns arise . -recommend Flu shot and consideration of pneumococcal vaccination - Donald [name] politely declined these.  2)  no Recurrent  Symptoms suggestive of small bowel obstruction since his last clinic visit. Previously has had a few episodes of SBO likely due to adhesions which resolved with conservative management. Plan --Donald [name] understands that Donald [name] is at high risk for recurrent symptoms of small bowel obstruction -Donald [name] notes Donald [name] has been working to keep himself well-hydrated and avoiding excessively high fiber diet -continues to decline  surgical evaluation at his time .  RTC with Dr Irene Limbo with labs in 6 months  The total time spent in the appointment was 15 minutes and more than 50% was on counseling and direct Donald [name] cares.   Sullivan Lone MD [place] AAHIVMS [facility] Hematology/Oncology Physician [place]  (Office):       [phone] (Work cell):  [phone] (Fax):           [phone]  This document serves as a record of services personally performed by Sullivan Lone, MD. It was created on his behalf by Baldwin Jamaica, a trained medical scribe. The creation of this record is based on the scribe's personal observations and the provider's statements to them.   .I have reviewed the above documentation for accuracy and completeness, and I agree with the above. Brunetta Genera MD MS

## 2018-01-06 ENCOUNTER — Encounter: Payer: Self-pay | Admitting: Hematology

## 2018-01-06 ENCOUNTER — Telehealth: Payer: Self-pay

## 2018-01-06 ENCOUNTER — Inpatient Hospital Stay: Payer: BLUE CROSS/BLUE SHIELD | Attending: Hematology | Admitting: Hematology

## 2018-01-06 ENCOUNTER — Inpatient Hospital Stay: Payer: BLUE CROSS/BLUE SHIELD

## 2018-01-06 VITALS — BP 138/80 | HR 60 | Temp 98.6°F | Resp 18 | Ht 72.0 in | Wt 208.5 lb

## 2018-01-06 DIAGNOSIS — K565 Intestinal adhesions [bands], unspecified as to partial versus complete obstruction: Secondary | ICD-10-CM | POA: Insufficient documentation

## 2018-01-06 DIAGNOSIS — Z8572 Personal history of non-Hodgkin lymphomas: Secondary | ICD-10-CM | POA: Insufficient documentation

## 2018-01-06 DIAGNOSIS — C8378 Burkitt lymphoma, lymph nodes of multiple sites: Secondary | ICD-10-CM

## 2018-01-06 DIAGNOSIS — R5382 Chronic fatigue, unspecified: Secondary | ICD-10-CM

## 2018-01-06 DIAGNOSIS — C91A1 Mature B-cell leukemia Burkitt-type, in remission: Secondary | ICD-10-CM

## 2018-01-06 LAB — COMPREHENSIVE METABOLIC PANEL
ALBUMIN: 4.1 g/dL (ref 3.5–5.0)
ALT: 27 U/L (ref 0–55)
ANION GAP: 11 (ref 3–11)
AST: 21 U/L (ref 5–34)
Alkaline Phosphatase: 70 U/L (ref 40–150)
BUN: 17 mg/dL (ref 7–26)
CHLORIDE: 103 mmol/L (ref 98–109)
CO2: 25 mmol/L (ref 22–29)
Calcium: 9.4 mg/dL (ref 8.4–10.4)
Creatinine, Ser: 0.85 mg/dL (ref 0.70–1.30)
GFR calc Af Amer: >60 mL/min (ref >60)
GFR calc non Af Amer: >60 mL/min (ref >60)
GLUCOSE: 92 mg/dL (ref 70–140)
POTASSIUM: 3.8 mmol/L (ref 3.5–5.1)
Sodium: 139 mmol/L (ref 136–145)
TOTAL PROTEIN: 7.1 g/dL (ref 6.4–8.3)
Total Bilirubin: 0.3 mg/dL (ref 0.2–1.2)

## 2018-01-06 LAB — CBC WITH DIFFERENTIAL/PLATELET
BASOS ABS: 0 10*3/uL (ref 0.0–0.1)
Basophils Relative: 1 %
EOS PCT: 2 %
Eosinophils Absolute: 0.2 10*3/uL (ref 0.0–0.5)
HEMATOCRIT: 42.7 % (ref 38.4–49.9)
Hemoglobin: 14.4 g/dL (ref 13.0–17.1)
LYMPHS ABS: 2.4 10*3/uL (ref 0.9–3.3)
LYMPHS PCT: 32 %
MCH: 29.9 pg (ref 27.2–33.4)
MCHC: 33.7 g/dL (ref 32.0–36.0)
MCV: 88.8 fL (ref 79.3–98.0)
MONO ABS: 0.6 10*3/uL (ref 0.1–0.9)
MONOS PCT: 8 %
NEUTROS ABS: 4.3 10*3/uL (ref 1.5–6.5)
Neutrophils Relative %: 57 %
Platelets: 177 10*3/uL (ref 140–400)
RBC: 4.81 MIL/uL (ref 4.20–5.82)
RDW: 12.7 % (ref 11.0–14.6)
WBC: 7.5 10*3/uL (ref 4.0–10.3)

## 2018-01-06 LAB — RETICULOCYTES
RBC.: 4.81 MIL/uL (ref 4.20–5.82)
RETIC COUNT ABSOLUTE: 77 10*3/uL (ref 34.8–93.9)
Retic Ct Pct: 1.6 % (ref 0.8–1.8)

## 2018-01-06 LAB — LACTATE DEHYDROGENASE: LDH: 198 U/L (ref 125–245)

## 2018-01-06 NOTE — Telephone Encounter (Signed)
Spoken with patient concerning upcoming appointment. Per 2/27 los

## 2018-07-02 ENCOUNTER — Telehealth: Payer: Self-pay

## 2018-07-02 NOTE — Telephone Encounter (Signed)
Return patient call and was unable to leave a message concerning their appointment due to their voice mail box was full. Mailed a letter to verify this appointment with a calender enclosed. Per 8/22 phone msg return

## 2018-07-07 ENCOUNTER — Other Ambulatory Visit: Payer: Self-pay

## 2018-07-07 DIAGNOSIS — C8378 Burkitt lymphoma, lymph nodes of multiple sites: Secondary | ICD-10-CM

## 2018-07-08 ENCOUNTER — Inpatient Hospital Stay: Payer: Self-pay

## 2018-07-08 ENCOUNTER — Inpatient Hospital Stay: Payer: Self-pay | Attending: Hematology | Admitting: Hematology

## 2018-07-08 ENCOUNTER — Encounter: Payer: Self-pay | Admitting: Hematology

## 2018-07-08 VITALS — BP 122/71 | HR 60 | Temp 98.8°F | Resp 18 | Ht 72.0 in | Wt 214.3 lb

## 2018-07-08 DIAGNOSIS — Z9221 Personal history of antineoplastic chemotherapy: Secondary | ICD-10-CM | POA: Insufficient documentation

## 2018-07-08 DIAGNOSIS — C8370 Burkitt lymphoma, unspecified site: Secondary | ICD-10-CM | POA: Insufficient documentation

## 2018-07-08 DIAGNOSIS — C8378 Burkitt lymphoma, lymph nodes of multiple sites: Secondary | ICD-10-CM

## 2018-07-08 LAB — CBC WITH DIFFERENTIAL (CANCER CENTER ONLY)
BASOS ABS: 0 10*3/uL (ref 0.0–0.1)
BASOS PCT: 0 %
Eosinophils Absolute: 0.1 10*3/uL (ref 0.0–0.5)
Eosinophils Relative: 2 %
HCT: 42.9 % (ref 38.4–49.9)
HEMOGLOBIN: 14.2 g/dL (ref 13.0–17.1)
Lymphocytes Relative: 36 %
Lymphs Abs: 2.3 10*3/uL (ref 0.9–3.3)
MCH: 29.6 pg (ref 27.2–33.4)
MCHC: 33.1 g/dL (ref 32.0–36.0)
MCV: 89.6 fL (ref 79.3–98.0)
Monocytes Absolute: 0.5 10*3/uL (ref 0.1–0.9)
Monocytes Relative: 7 %
NEUTROS PCT: 55 %
Neutro Abs: 3.5 10*3/uL (ref 1.5–6.5)
Platelet Count: 183 10*3/uL (ref 140–400)
RBC: 4.79 MIL/uL (ref 4.20–5.82)
RDW: 12.9 % (ref 11.0–14.6)
WBC: 6.3 10*3/uL (ref 4.0–10.3)

## 2018-07-08 LAB — CMP (CANCER CENTER ONLY)
ALT: 22 U/L (ref 0–44)
AST: 24 U/L (ref 15–41)
Albumin: 4.1 g/dL (ref 3.5–5.0)
Alkaline Phosphatase: 56 U/L (ref 38–126)
Anion gap: 8 (ref 5–15)
BUN: 14 mg/dL (ref 6–20)
CHLORIDE: 106 mmol/L (ref 98–111)
CO2: 27 mmol/L (ref 22–32)
Calcium: 9.2 mg/dL (ref 8.9–10.3)
Creatinine: 0.98 mg/dL (ref 0.61–1.24)
GFR, Est AFR Am: >60 mL/min (ref >60)
GFR, Estimated: >60 mL/min (ref >60)
Glucose, Bld: 85 mg/dL (ref 70–99)
Potassium: 4.2 mmol/L (ref 3.5–5.1)
SODIUM: 141 mmol/L (ref 135–145)
Total Bilirubin: 0.3 mg/dL (ref 0.3–1.2)
Total Protein: 6.8 g/dL (ref 6.5–8.1)

## 2018-07-08 LAB — RETICULOCYTES
RBC.: 4.79 MIL/uL (ref 4.20–5.82)
RETIC COUNT ABSOLUTE: 52.7 10*3/uL (ref 34.8–93.9)
Retic Ct Pct: 1.1 % (ref 0.8–1.8)

## 2018-07-08 LAB — LACTATE DEHYDROGENASE: LDH: 192 U/L (ref 98–192)

## 2018-07-08 NOTE — Progress Notes (Signed)
[name]    HEMATOLOGY/ONCOLOGY CLINIC NOTE  Date of Service: 07/08/18    Patient Care Team: Rosine Abe, DO as PCP - General (Internal Medicine)  CHIEF COMPLAINTS/PURPOSE OF CONSULTATION:   F/u for Burkitts lymphoma  Diagnosis: Stage III Burkitt's lymphoma without CNS involvement diagnosed in September 2016.  TREATMENT EPOCH-R + CNS prophylaxis as per NEJM 2013 Dunleavy et al. S/p 6 cycles of EPOCH-R completed 12/17/2015.  s/p 4 doses of intrathecal methotrexate plus hydrocortisone for CNS prophylaxis  INTERVAL HISTORY:  Donald [name] is is here for his scheduled 6 month clinic follow-up for his Burkitt's lymphoma. He is now 2 years out from completing his planned treatment for Burkitts lymphoma. The patient's last visit with [place] was on 01/06/18. The pt reports that he is doing well overall.   The pt reports that he recently had an itchy rash on his right arm that was resolved with a course of prednisone. He notes that it began on the right side of his face and completely resolved. He notes that he recently feels that his liver has felt more full and uncomfortable when leaning over and riding his bike. He is conscious of this when he carries something.   The pt notes that his small bowel obstruction hasn't felt symptomatic recently. He has been eating well and notes that he doesn't have too much stress.   Lab results today (07/08/18) of CBC w/diff, CMP, and Reticulocytes is as follows: all values are WNL. 07/08/18 LDH is WNL at 192.   On review of systems, pt reports occasional feelings of abdominal fullness, recent but resolved rash, good energy levels, eating well, and denies fevers, chills, night sweats, unexpected weight loss, abdominal pain, changes in bowel habits, leg swelling, testicular pain or swelling, and any other symptoms.   MEDICAL HISTORY:   Past Medical History:  Diagnosis Date  . Burkitt lymphoma ([place]) 08/15/2015  . Complication of anesthesia   . EBV infection  2013   Patient notes that he had an EBV infection in 2013 that left him with chronic fatigue. She also notes that he had significant lymphadenopathy and thyroiditis at the time. He has been managing his symptoms with an alternative medicine practitioner in [place] and with other alternative medicines.  . Marijuana use, episodic   . SBO (small bowel obstruction) ([place])     SURGICAL HISTORY: Past Surgical History:  Procedure Laterality Date  . APPENDECTOMY    . LAPAROSCOPY N/A 07/20/2015   Procedure: LAPAROSCOPY DIAGNOSTIC, MULTIPLE BIOPSIES, DRAINAGE OF ASCITES;  Surgeon: Fanny Skates, MD;  Location: [facility];  Service: General;  Laterality: N/A;  . TONSILLECTOMY      SOCIAL HISTORY: Social History   Socioeconomic History  . Marital status: Married    Spouse name: Not on file  . Number of children: Not on file  . Years of education: Not on file  . Highest education level: Not on file  Occupational History  . Not on file  Social Needs  . Financial resource strain: Not on file  . Food insecurity:    Worry: Not on file    Inability: Not on file  . Transportation needs:    Medical: Not on file    Non-medical: Not on file  Tobacco Use  . Smoking status: Never Smoker  . Smokeless tobacco: Never Used  Substance and Sexual Activity  . Alcohol use: No    Alcohol/week: 0.0 standard drinks  . Drug use: Yes    Frequency: 1.0 times per week  Types: Marijuana  . Sexual activity: Yes  Lifestyle  . Physical activity:    Days per week: Not on file    Minutes per session: Not on file  . Stress: Not on file  Relationships  . Social connections:    Talks on phone: Not on file    Gets together: Not on file    Attends religious service: Not on file    Active member of club or organization: Not on file    Attends meetings of clubs or organizations: Not on file    Relationship status: Not on file  . Intimate partner violence:    Fear of current or ex partner: Not on file     Emotionally abused: Not on file    Physically abused: Not on file    Forced sexual activity: Not on file  Other Topics Concern  . 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   He has a fiance and has been in a steady relationship.      He has tried to maintain a very healthy lifestyle and cycles about 100 miles a week. He also uses a fair number of over-the-counter alternative medicines to stay healthy.    FAMILY HISTORY: Family History  Problem Relation Age of Onset  . Heart failure Father     ALLERGIES:  is allergic to levaquin [levofloxacin] and sulfa antibiotics.  MEDICATIONS:  No current outpatient medications on file.   No current facility-administered medications for this visit.    Facility-Administered Medications Ordered in Other Visits  Medication Dose Route Frequency Provider Last Rate Last Dose  . heparin lock flush 100 unit/mL  500 Units Intravenous Once Brunetta Genera, MD      . sodium chloride flush (NS) 0.9 % injection 10 mL  10 mL Intravenous Once Irene Limbo, Cloria [place], MD        REVIEW OF SYSTEMS:    A 10+ POINT REVIEW OF SYSTEMS WAS OBTAINED including neurology, dermatology, psychiatry, cardiac, respiratory, lymph, extremities, GI, GU, Musculoskeletal, constitutional, breasts, reproductive, HEENT.  All pertinent positives are noted in the HPI.  All others are negative.   PHYSICAL EXAMINATION: ECOG PERFORMANCE STATUS:0 .BP 122/71 (BP Location: Left Arm, Patient Position: Sitting)   Pulse 60   Temp 98.8 F (37.1 C) (Oral)   Resp 18   Ht 6' (1.829 m)   Wt 214 lb 4.8 oz (97.2 kg)   SpO2 100%   BMI 29.06 kg/m   GENERAL:alert, in no acute distress and comfortable SKIN: no acute rashes, no significant lesions EYES: conjunctiva are pink and non-injected, sclera anicteric OROPHARYNX: MMM, no exudates, no oropharyngeal erythema or ulceration NECK: supple, no  JVD LYMPH:  no palpable lymphadenopathy in the cervical, axillary or inguinal regions LUNGS: clear to auscultation b/l with normal respiratory effort HEART: regular rate & rhythm ABDOMEN:  normoactive bowel sounds , non tender, not distended. No palpable hepatosplenomegaly.  Extremity: no pedal edema PSYCH: alert & oriented x 3 with fluent speech NEURO: no focal motor/sensory deficits   LABORATORY DATA:  I have reviewed the data as listed  . CBC Latest Ref Rng & Units 07/08/2018 01/06/2018 10/06/2017  WBC 4.0 - 10.3 K/uL 6.3 7.5 8.8  Hemoglobin 13.0 - 17.1 g/dL 14.2 14.4 14.3  Hematocrit 38.4 - 49.9 % 42.9 42.7 42.4  Platelets 140 - 400 K/uL 183 177 220    . CMP Latest Ref Rng &  Units 07/08/2018 01/06/2018 10/06/2017  Glucose 70 - 99 mg/dL 85 92 84  BUN 6 - 20 mg/dL 14 17 18.6  Creatinine 0.61 - 1.24 mg/dL 0.98 0.85 0.9  Sodium 135 - 145 mmol/L 141 139 139  Potassium 3.5 - 5.1 mmol/L 4.2 3.8 4.2  Chloride 98 - 111 mmol/L 106 103 -  CO2 22 - 32 mmol/L 27 25 26   Calcium 8.9 - 10.3 mg/dL 9.2 9.4 9.0  Total Protein 6.5 - 8.1 g/dL 6.8 7.1 7.0  Total Bilirubin 0.3 - 1.2 mg/dL 0.3 0.3 0.39  Alkaline Phos 38 - 126 U/L 56 70 58  AST 15 - 41 U/L 24 21 18   ALT 0 - 44 U/L 22 27 21    . Lab Results  Component Value Date   LDH 192 07/08/2018    RADIOGRAPHIC STUDIES: No new imaging  ASSESSMENT & PLAN:   Patient is a 42 yo male with  1) High risk Burkitt's lymphoma stage III with no CNS involvement -currently in remission.  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 Patient is status post 6 cycles of EPOCH-R and received 4 doses of intrathecal methotrexate for CNS prophylaxis. That PET CT scan performed 01/07/2016 after completion of planned treatment showed no evidence of residual hypermetabolic disease.  PLAN:  -he is now 2 yrs post treatment completion and we discussed that this decreases his risk of  Burkitts lymphoma recurrence significantly. -he is aware of the symptoms of lymphoma recurrence and has been counseled to call us immediately if any new questions or concerns arise -recommend Flu shot and consideration of pneumococcal vaccination - patient politely declined these. -Discussed pt labwork today, 07/08/18; blood counts, chemistries, LDH are all within normal values.  -The pt shows no clinical or lab progression of Burkitt lymphoma at this time.  -No indication for further treatment at this time.   -Will see the pt back in 6 months, sooner if any new concerns    2) no Recurrent  Symptoms suggestive of small bowel obstruction since his last clinic visit. Previously has had a few episodes of SBO likely due to adhesions which resolved with conservative management. Plan -He understands that he is at high risk for recurrent symptoms of small bowel obstruction -He notes he has been working to keep himself well-hydrated and avoiding excessively high fiber diet -continues to decline  surgical evaluation at his time .   RTC with Dr Irene Limbo in 6 months with labs   The total time spent in the appt was 20 minutes and more than 50% was on counseling and direct patient cares.   Sullivan Lone MD [place] AAHIVMS [facility] Hematology/Oncology Physician [place]  (Office):       [phone] (Work cell):  [phone] (Fax):           [phone]  I, Baldwin [place], am acting as a scribe for Dr. Irene Limbo  .I have reviewed the above documentation for accuracy and completeness, and I agree with the above. Brunetta Genera MD

## 2018-07-09 ENCOUNTER — Telehealth: Payer: Self-pay

## 2018-07-09 NOTE — Telephone Encounter (Signed)
Patient mail box full. Mailed a letter with a calender enclosed. Per 8/29 los

## 2018-12-04 ENCOUNTER — Encounter: Payer: Self-pay | Admitting: Hematology & Oncology

## 2018-12-04 ENCOUNTER — Other Ambulatory Visit: Payer: Self-pay | Admitting: Nurse Practitioner

## 2019-01-05 ENCOUNTER — Telehealth: Payer: Self-pay | Admitting: *Deleted

## 2019-01-05 ENCOUNTER — Other Ambulatory Visit: Payer: Self-pay | Admitting: *Deleted

## 2019-01-05 DIAGNOSIS — C8378 Burkitt lymphoma, lymph nodes of multiple sites: Secondary | ICD-10-CM

## 2019-01-05 NOTE — Telephone Encounter (Signed)
Patient called to ask if Dr. Irene Limbo would order Epstein-Barr Virus panel to his lab work on Friday. Per Dr. Irene Limbo, will discuss during appt and order following appt if indicated. Contacted patient with this information - he verbalized understanding.

## 2019-01-06 ENCOUNTER — Emergency Department (HOSPITAL_COMMUNITY)
Admission: EM | Admit: 2019-01-06 | Discharge: 2019-01-07 | Disposition: A | Discharge status: Home / Self Care | Payer: BLUE CROSS/BLUE SHIELD | Attending: Emergency Medicine | Admitting: Emergency Medicine

## 2019-01-06 ENCOUNTER — Emergency Department (HOSPITAL_COMMUNITY): Payer: BLUE CROSS/BLUE SHIELD

## 2019-01-06 ENCOUNTER — Encounter (HOSPITAL_COMMUNITY): Payer: Self-pay | Admitting: Emergency Medicine

## 2019-01-06 DIAGNOSIS — R110 Nausea: Secondary | ICD-10-CM | POA: Insufficient documentation

## 2019-01-06 DIAGNOSIS — Z8572 Personal history of non-Hodgkin lymphomas: Secondary | ICD-10-CM | POA: Diagnosis not present

## 2019-01-06 DIAGNOSIS — R0789 Other chest pain: Secondary | ICD-10-CM | POA: Insufficient documentation

## 2019-01-06 DIAGNOSIS — R42 Dizziness and giddiness: Secondary | ICD-10-CM | POA: Insufficient documentation

## 2019-01-06 DIAGNOSIS — F1290 Cannabis use, unspecified, uncomplicated: Secondary | ICD-10-CM | POA: Insufficient documentation

## 2019-01-06 DIAGNOSIS — R0602 Shortness of breath: Secondary | ICD-10-CM | POA: Insufficient documentation

## 2019-01-06 DIAGNOSIS — R079 Chest pain, unspecified: Secondary | ICD-10-CM

## 2019-01-06 LAB — I-STAT TROPONIN, ED
Troponin i, poc: 0 ng/mL (ref 0.00–0.08)
Troponin i, poc: 0 ng/mL (ref 0.00–0.08)

## 2019-01-06 LAB — CBC
HCT: 39.9 % (ref 39.0–52.0)
Hemoglobin: 13 g/dL (ref 13.0–17.0)
MCH: 28.8 pg (ref 26.0–34.0)
MCHC: 32.6 g/dL (ref 30.0–36.0)
MCV: 88.5 fL (ref 80.0–100.0)
Platelets: 174 10*3/uL (ref 150–400)
RBC: 4.51 MIL/uL (ref 4.22–5.81)
RDW: 11.9 % (ref 11.5–15.5)
WBC: 8.2 10*3/uL (ref 4.0–10.5)
nRBC: 0 % (ref 0.0–0.2)

## 2019-01-06 LAB — BASIC METABOLIC PANEL
Anion gap: 5 (ref 5–15)
BUN: 14 mg/dL (ref 6–20)
CO2: 27 mmol/L (ref 22–32)
CREATININE: 1 mg/dL (ref 0.61–1.24)
Calcium: 8.3 mg/dL — ABNORMAL LOW (ref 8.9–10.3)
Chloride: 106 mmol/L (ref 98–111)
GFR calc Af Amer: >60 mL/min (ref >60)
Glucose, Bld: 136 mg/dL — ABNORMAL HIGH (ref 70–99)
Potassium: 3.4 mmol/L — ABNORMAL LOW (ref 3.5–5.1)
Sodium: 138 mmol/L (ref 135–145)

## 2019-01-06 MED ORDER — SODIUM CHLORIDE 0.9% FLUSH: 3.0000 mL | Freq: Once | INTRAVENOUS | Status: DC

## 2019-01-06 NOTE — ED Triage Notes (Signed)
  Patient BIB EMS for chest pain that started about an hour ago.  Patient states he was sitting on the couch and had sudden L sided chest pain that radiated to arm.  Patient said he was diaphoretic and nauseated.  Patient was SOB and felt dizzy.  Pain 10/10 on EMS arrival.  Patient was given 324 ASA and 3 nitro tabs.  Pain 1/10 now.  No cardiac hx.  Patient is A&O x4.

## 2019-01-06 NOTE — ED Provider Notes (Signed)
[facility] EMERGENCY DEPARTMENT Provider Note   CSN: [id number] Arrival date & time: 01/06/19  2018    History   Chief Complaint Chief Complaint  Patient presents with  . Chest Pain  . Shortness of Breath    HPI Donald [name] is a 43 y.o. male who has a past medical history of EBV and Burkitt lymphoma which has been in remission for the past 3 years.  Patient states that he and his wife were sitting on the couch getting ready to go out when he had sudden onset of spasm-like left-sided chest pain, left arm weakness, inability to take a deep breath, severe pain.  He states that he became diaphoretic and was unable to move off the couch to get in the car to come to the hospital.  He felt lightheaded.  He had some associated nausea without vomiting.  Patient states that the pain lasted for about 15 minutes.  He took a full dose of aspirin and EMS arrived.  He states when he got into the ambulance his symptoms suddenly stopped.  He has had no return of symptoms.  He did not get any sublingual nitroglycerin.  The patient denies a history of PE or DVT.  He denies hemoptysis, unilateral leg swelling, pleuritic chest pain.  The patient has no history of stroke, has no history of peripheral artery disease, has not smoked in the past 90 days, denies any history of treated diabetes, has no relevant family history of coronary artery disease (first degree relative at less than age 19), is not hypertensive, has no history of hypercholesterolemia and does not have an elevated BMI (>=30).      Past Medical History:  Diagnosis Date  . Burkitt lymphoma ([place]) 08/15/2015  . Complication of anesthesia   . EBV infection 2013   Patient notes that he had an EBV infection in 2013 that left him with chronic fatigue. She also notes that he had significant lymphadenopathy and thyroiditis at the time. He has been managing his symptoms with an alternative medicine practitioner in [place] and with  other alternative medicines.  . Marijuana use, episodic   . SBO (small bowel obstruction) [facility])     Patient Active Problem List   Diagnosis Date Noted  . Irregular heart beat 01/16/2017  . Marijuana abuse 01/16/2017  . Chemotherapy induced nausea and vomiting   . Burkitt lymphoma of intra-abdominal lymph nodes ([place])   . Burkitt's lymphoma of lymph nodes of multiple regions ([place]) 12/03/2015  . Acute sinusitis 11/20/2015  . Generalized abdominal pain   . Leukocytosis 10/23/2015  . SBO (small bowel obstruction) ([place])   . Abdominal pain   . Dehydration   . Oral thrush   . Encounter for chemotherapy management   . Gastritis and gastroduodenitis   . Cephalalgia   . Uncontrollable vomiting   . Constipation   . Cerebrospinal fluid leak from spinal puncture   . Spinal headache   . Burkitt lymphoma ([place]) 08/15/2015  . Burkitt lymphoma of lymph nodes of multiple regions ([place])   . Chemotherapy management, encounter for   . CN (constipation)   . Dyspnea   . Encounter for antineoplastic chemotherapy 07/25/2015  . Burkitt's lymphoma ([place]) 07/24/2015  . Acute respiratory failure with hypoxemia ([place])   . Acute respiratory failure with hypoxia ([place]) 07/20/2015  . ARF (acute renal failure) ([place])   . Elevated LFTs   . Lymphoma ([place])   . Abdominal distension   . AKI (acute kidney injury) ([place])   .  Ascites   . Mass   . Spontaneous tumor lysis syndrome   . Pleural effusion 07/15/2015  . Pleural effusion, right 07/14/2015  . Chronic fatigue syndrome 09/23/2012    Past Surgical History:  Procedure Laterality Date  . APPENDECTOMY    . LAPAROSCOPY N/A 07/20/2015   Procedure: LAPAROSCOPY DIAGNOSTIC, MULTIPLE BIOPSIES, DRAINAGE OF ASCITES;  Surgeon: Fanny Skates, MD;  Location: [facility];  Service: General;  Laterality: N/A;  . TONSILLECTOMY          Home Medications    Prior to Admission medications   Not on File    Family History Family History  Problem Relation Age of Onset  .  Heart failure Father     Social History Social History   Tobacco Use  . Smoking status: Never Smoker  . Smokeless tobacco: Never Used  Substance Use Topics  . Alcohol use: No    Alcohol/week: 0.0 standard drinks  . Drug use: Yes    Frequency: 1.0 times per week    Types: Marijuana     Allergies   Levaquin [levofloxacin] and Sulfa antibiotics   Review of Systems Review of Systems Ten systems reviewed and are negative for acute change, except as noted in the HPI.    Physical Exam Updated Vital Signs BP 109/69   Pulse 66   Temp 98.2 F (36.8 C) (Oral)   Resp 12   Ht 6' (1.829 m)   Wt 95.3 kg   SpO2 97%   BMI 28.48 kg/m   Physical Exam Vitals signs and nursing note reviewed.  Constitutional:      General: He is not in acute distress.    Appearance: He is well-developed. He is not diaphoretic.  HENT:     Head: Normocephalic and atraumatic.  Eyes:     General: No scleral icterus.    Conjunctiva/sclera: Conjunctivae normal.  Neck:     Musculoskeletal: Normal range of motion and neck supple.  Cardiovascular:     Rate and Rhythm: Normal rate and regular rhythm.     Heart sounds: Normal heart sounds.  Pulmonary:     Effort: Pulmonary effort is normal. No respiratory distress.     Breath sounds: Normal breath sounds.  Abdominal:     Palpations: Abdomen is soft.     Tenderness: There is no abdominal tenderness.  Skin:    General: Skin is warm and dry.  Neurological:     Mental Status: He is alert.  Psychiatric:        Behavior: Behavior normal.      ED Treatments / Results  Labs (all labs ordered are listed, but only abnormal results are displayed) Labs Reviewed  CBC  BASIC METABOLIC PANEL  I-STAT TROPONIN, ED    EKG EKG Interpretation  Date/Time:  Thursday January 06 2019 20:18:30 EST Ventricular Rate:  59 PR Interval:    QRS Duration: 155 QT Interval:  423 QTC Calculation: 419 R Axis:   53 Text Interpretation:  Sinus rhythm Nonspecific  intraventricular conduction delay Confirmed by Nat Christen [phone]) on 01/06/2019 8:42:42 PM   Radiology No results found.  Procedures Procedures (including critical care time)  Medications Ordered in ED Medications  sodium chloride flush (NS) 0.9 % injection 3 mL (has no administration in time range)     Initial Impression / Assessment and Plan / ED Course  I have reviewed the triage vital signs and the nursing notes.  Pertinent labs & imaging results that were available during my care of the  patient were reviewed by me and considered in my medical decision making (see chart for details).       9:08 PM BP 118/76   Pulse 65   Temp 98.2 F (36.8 C) (Oral)   Resp 12   Ht 6' (1.829 m)   Wt 95.3 kg   SpO2 98%   BMI 28.48 kg/m  Patient with c/o cp. Hx gathered fform the patient and his wife who is at bedside.The emergent differential diagnosis of chest pain includes: Acute coronary syndrome, pericarditis, aortic dissection, pulmonary embolism, tension pneumothorax, pneumonia, and esophageal rupture. His work up is still pending however he is currently low risk for MI. PERC negative. I have given sign out to Dr. Lacinda Axon who will assume care of the patient.    Final Clinical Impressions(s) / ED Diagnoses   Final diagnoses:  None    ED Discharge Orders    None       Margarita Mail, PA-C 01/06/19 2149    Nat Christen, MD 01/07/19 [phone]

## 2019-01-06 NOTE — Discharge Instructions (Signed)
Test showed no life-threatening condition.  Recommend follow-up with cardiologist.  Phone number given.  Would also suggest a primary care doctor.

## 2019-01-06 NOTE — Progress Notes (Signed)
[name]    HEMATOLOGY/ONCOLOGY CLINIC NOTE  Date of Service: 01/07/19    Patient Care Team: Rosine Abe, DO as PCP - General (Internal Medicine)  CHIEF COMPLAINTS/PURPOSE OF CONSULTATION:   F/u for Burkitts lymphoma  Diagnosis: Stage III Burkitt's lymphoma without CNS involvement diagnosed in September 2016.  TREATMENT EPOCH-R + CNS prophylaxis as per NEJM 2013 Dunleavy et al. S/p 6 cycles of EPOCH-R completed 12/17/2015.  s/p 4 doses of intrathecal methotrexate plus hydrocortisone for CNS prophylaxis  INTERVAL HISTORY:  [name] is is here for his scheduled 6 month clinic follow-up for his Burkitt's lymphoma. The patient's last visit with [place] was on 07/08/18. The pt reports that he is doing well overall.   The pt notes that last night while sitting on his couch he developed sudden left sided chest pain which worsened. He had left arm numbness, then suddenly became diaphoretic, SOB, and nauseous which lasted for 15 minutes. He notes that he took 4 aspirin at home, and then after he was in the ambulance his pain completely resolved within 10-15 minutes. He notes that "all" of his symptoms completely resolved. After presenting to the ED he had a CXR and troponins which were both negative. He did not have a CTA Chest. The pt denies recent infections. He has been staying active recently with going to the gym and denies overdoing this. He last smoked marijuana more than 20 days before. The pt denies using any other drugs or chemicals. He is taking 1000mg  Vitamin C a couple times a week, Vitamin B, and Vitamin D in the winter, and denies taking other supplements.   The pt notes that other than this episode, he has enjoyed good energy levels, has been active, and denies recent infections.  He notes that he is having small red flat, bumps "which slough skin," appear near his genitals. He notes these are painful during intercourse. He notes that he had a dental infection in late December at  which time he took an antibiotic, which also cleared his genital rash.  The pt reports that he had difficulty seeing out of his left eye in early January, and had a "full cataract on the left side," and is pursuing further work up. He denies recent trauma to his eye  The pt notes that when he first developed EBV in 2013 he developed a stinging pain in his lower back. He feels that he has having a similar stinging pain in his knees which are tender to the touch.   He denies abdominal pains, nausea, vomiting, diarrhea, unexpected weight loss, fevers, chills, or night sweats.   He feels that his stress levels have been normal.  Lab results today (01/07/19) of CBC w/diff and CMP is as follows: all values are WNL except for Calcium at 8.6, ALT at 62. 01/07/19 LDH is at 197  On review of systems, pt reports good energy levels, staying active, occasional genital rash, and denies SOB, CP, mouth sores, recent infections, and any other symptoms.  MEDICAL HISTORY:   Past Medical History:  Diagnosis Date  . Burkitt lymphoma ([place]) 08/15/2015  . Complication of anesthesia   . EBV infection 2013   Patient notes that he had an EBV infection in 2013 that left him with chronic fatigue. She also notes that he had significant lymphadenopathy and thyroiditis at the time. He has been managing his symptoms with an alternative medicine practitioner in [place] and with other alternative medicines.  . Marijuana use, episodic   .  SBO (small bowel obstruction) ([place])     SURGICAL HISTORY: Past Surgical History:  Procedure Laterality Date  . APPENDECTOMY    . LAPAROSCOPY N/A 07/20/2015   Procedure: LAPAROSCOPY DIAGNOSTIC, MULTIPLE BIOPSIES, DRAINAGE OF ASCITES;  Surgeon: Fanny Skates, MD;  Location: [facility];  Service: General;  Laterality: N/A;  . TONSILLECTOMY      SOCIAL HISTORY: Social History   Socioeconomic History  . Marital status: Married    Spouse name: Not on file  . Number of children: Not  on file  . Years of education: Not on file  . Highest education level: Not on file  Occupational History  . Not on file  Social Needs  . Financial resource strain: Not on file  . Food insecurity:    Worry: Not on file    Inability: Not on file  . Transportation needs:    Medical: Not on file    Non-medical: Not on file  Tobacco Use  . Smoking status: Never Smoker  . Smokeless tobacco: Never Used  Substance and Sexual Activity  . Alcohol use: No    Alcohol/week: 0.0 standard drinks  . Drug use: Yes    Frequency: 1.0 times per week    Types: Marijuana  . Sexual activity: Yes  Lifestyle  . Physical activity:    Days per week: Not on file    Minutes per session: Not on file  . Stress: Not on file  Relationships  . Social connections:    Talks on phone: Not on file    Gets together: Not on file    Attends religious service: Not on file    Active member of club or organization: Not on file    Attends meetings of clubs or organizations: Not on file    Relationship status: Not on file  . Intimate partner violence:    Fear of current or ex partner: Not on file    Emotionally abused: Not on file    Physically abused: Not on file    Forced sexual activity: Not on file  Other Topics Concern  . 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   He has a fiance and has been in a steady relationship.      He has tried to maintain a very healthy lifestyle and cycles about 100 miles a week. He also uses a fair number of over-the-counter alternative medicines to stay healthy.    FAMILY HISTORY: Family History  Problem Relation Age of Onset  . Heart failure Father     ALLERGIES:  is allergic to levaquin [levofloxacin] and sulfa antibiotics.  MEDICATIONS:  No current outpatient medications on file.   No current facility-administered medications for this visit.    Facility-Administered  Medications Ordered in Other Visits  Medication Dose Route Frequency Provider Last Rate Last Dose  . heparin lock flush 100 unit/mL  500 Units Intravenous Once Brunetta Genera, MD      . sodium chloride flush (NS) 0.9 % injection 10 mL  10 mL Intravenous Once Irene Limbo, Cloria Spring, MD        REVIEW OF SYSTEMS:    A 10+ POINT REVIEW OF SYSTEMS WAS OBTAINED including neurology, dermatology, psychiatry, cardiac, respiratory, lymph, extremities, GI, GU, Musculoskeletal, constitutional, breasts, reproductive, HEENT.  All pertinent positives are noted in the HPI.  All others are negative.   PHYSICAL EXAMINATION: ECOG PERFORMANCE STATUS:0 .BP 136/63 (BP  Location: Left Arm, Patient Position: Sitting)   Pulse 61   Temp 98.5 F (36.9 C) (Oral)   Resp 18   Ht 6' (1.829 m)   Wt 218 lb 9.6 oz (99.2 kg)   SpO2 100%   BMI 29.65 kg/m   GENERAL:alert, in no acute distress and comfortable SKIN: no acute rashes, no significant lesions EYES: conjunctiva are pink and non-injected, sclera anicteric OROPHARYNX: MMM, no exudates, no oropharyngeal erythema or ulceration NECK: supple, no JVD LYMPH:  no palpable lymphadenopathy in the cervical, axillary or inguinal regions LUNGS: clear to auscultation b/l with normal respiratory effort HEART: regular rate & rhythm ABDOMEN:  normoactive bowel sounds , non tender, not distended. No palpable hepatosplenomegaly.  Extremity: no pedal edema PSYCH: alert & oriented x 3 with fluent speech NEURO: no focal motor/sensory deficits   LABORATORY DATA:  I have reviewed the data as listed  . CBC Latest Ref Rng & Units 01/07/2019 01/06/2019 07/08/2018  WBC 4.0 - 10.5 K/uL 7.0 8.2 6.3  Hemoglobin 13.0 - 17.0 g/dL 14.3 13.0 14.2  Hematocrit 39.0 - 52.0 % 43.7 39.9 42.9  Platelets 150 - 400 K/uL 198 174 183    . CMP Latest Ref Rng & Units 01/07/2019 01/06/2019 07/08/2018  Glucose 70 - 99 mg/dL 90 136(H) 85  BUN 6 - 20 mg/dL 15 14 14   Creatinine 0.61 - 1.24 mg/dL  0.86 1.00 0.98  Sodium 135 - 145 mmol/L 139 138 141  Potassium 3.5 - 5.1 mmol/L 4.1 3.4(L) 4.2  Chloride 98 - 111 mmol/L 104 106 106  CO2 22 - 32 mmol/L 26 27 27   Calcium 8.9 - 10.3 mg/dL 8.6(L) 8.3(L) 9.2  Total Protein 6.5 - 8.1 g/dL 6.8 - 6.8  Total Bilirubin 0.3 - 1.2 mg/dL 0.5 - 0.3  Alkaline Phos 38 - 126 U/L 62 - 56  AST 15 - 41 U/L 38 - 24  ALT 0 - 44 U/L 62(H) - 22   . Lab Results  Component Value Date   LDH 197 (H) 01/07/2019    RADIOGRAPHIC STUDIES: No new imaging  ASSESSMENT & PLAN:   Patient is a 43 yo male with  1) High risk Burkitt's lymphoma stage III with no CNS involvement -currently in remission.  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 Patient is status post 6 cycles of EPOCH-R and received 4 doses of intrathecal methotrexate for CNS prophylaxis. That PET CT scan performed 01/07/2016 after completion of planned treatment showed no evidence of residual hypermetabolic disease.  2) Anginal chest pain 1 episodes ACS ruled out in ED. ? SVT vs other arrhythmia vs prinzmetal angina vs atypical CP PLAN:  -Discussed pt labwork today, 01/07/19; blood counts are normal, chemistries are stable. LDH WNL -The pt shows no clinical or lab progression/return of Burkitt's Lymphoma at this time. -No indication for further treatment at this time.   -Pleurisy? Angina? SVT? -Referring the pt to cardiology for symptoms leading to recent ED visit -Advised that the pt establish care with a PCP. The pt notes that he will establish care with a PCP -he is now 2 yrs post treatment completion and we discussed that this decreases his risk of Burkitts lymphoma recurrence significantly. -he is aware of the symptoms of lymphoma recurrence and has been counseled to call us immediately if any new questions or concerns arise -recommend Flu shot and consideration of pneumococcal vaccination - patient politely  declined these. -Will see the pt  back in 6 months  2) no Recurrent  Symptoms suggestive of small bowel obstruction since his last clinic visit. Previously has had a few episodes of SBO likely due to adhesions which resolved with conservative management. Plan -He understands that he is at high risk for recurrent symptoms of small bowel obstruction -He notes he has been working to keep himself well-hydrated and avoiding excessively high fiber diet -continues to decline  surgical evaluation at his time .   Refer to cardiology for anginal chest pain RTC with Dr Irene Limbo with labs in 6 months   The total time spent in the appt was 25 minutes and more than 50% was on counseling and direct patient cares.   Sullivan Lone MD [place] AAHIVMS [facility] Hematology/Oncology Physician [place]  (Office):       [phone] (Work cell):  [phone] (Fax):           [phone]  I, Baldwin [place], am acting as a scribe for Dr. Sullivan Lone.   .I have reviewed the above documentation for accuracy and completeness, and I agree with the above. Brunetta Genera MD

## 2019-01-07 ENCOUNTER — Inpatient Hospital Stay: Payer: BLUE CROSS/BLUE SHIELD

## 2019-01-07 ENCOUNTER — Telehealth: Payer: Self-pay | Admitting: Hematology

## 2019-01-07 ENCOUNTER — Inpatient Hospital Stay: Payer: BLUE CROSS/BLUE SHIELD | Attending: Hematology | Admitting: Hematology

## 2019-01-07 VITALS — BP 136/63 | HR 61 | Temp 98.5°F | Resp 18 | Ht 72.0 in | Wt 218.6 lb

## 2019-01-07 DIAGNOSIS — Z9221 Personal history of antineoplastic chemotherapy: Secondary | ICD-10-CM | POA: Diagnosis not present

## 2019-01-07 DIAGNOSIS — C8378 Burkitt lymphoma, lymph nodes of multiple sites: Secondary | ICD-10-CM

## 2019-01-07 DIAGNOSIS — R079 Chest pain, unspecified: Secondary | ICD-10-CM

## 2019-01-07 DIAGNOSIS — C8370 Burkitt lymphoma, unspecified site: Secondary | ICD-10-CM | POA: Diagnosis not present

## 2019-01-07 LAB — CMP (CANCER CENTER ONLY)
ALT: 62 U/L — ABNORMAL HIGH (ref 0–44)
AST: 38 U/L (ref 15–41)
Albumin: 4 g/dL (ref 3.5–5.0)
Alkaline Phosphatase: 62 U/L (ref 38–126)
Anion gap: 9 (ref 5–15)
BUN: 15 mg/dL (ref 6–20)
CO2: 26 mmol/L (ref 22–32)
Calcium: 8.6 mg/dL — ABNORMAL LOW (ref 8.9–10.3)
Chloride: 104 mmol/L (ref 98–111)
Creatinine: 0.86 mg/dL (ref 0.61–1.24)
GFR, Estimated: >60 mL/min (ref >60)
Glucose, Bld: 90 mg/dL (ref 70–99)
POTASSIUM: 4.1 mmol/L (ref 3.5–5.1)
SODIUM: 139 mmol/L (ref 135–145)
Total Bilirubin: 0.5 mg/dL (ref 0.3–1.2)
Total Protein: 6.8 g/dL (ref 6.5–8.1)

## 2019-01-07 LAB — CBC WITH DIFFERENTIAL/PLATELET
Abs Immature Granulocytes: 0.03 10*3/uL (ref 0.00–0.07)
Basophils Absolute: 0 10*3/uL (ref 0.0–0.1)
Basophils Relative: 1 %
Eosinophils Absolute: 0.2 10*3/uL (ref 0.0–0.5)
Eosinophils Relative: 3 %
HCT: 43.7 % (ref 39.0–52.0)
Hemoglobin: 14.3 g/dL (ref 13.0–17.0)
Immature Granulocytes: 0 %
Lymphocytes Relative: 29 %
Lymphs Abs: 2.1 10*3/uL (ref 0.7–4.0)
MCH: 29.2 pg (ref 26.0–34.0)
MCHC: 32.7 g/dL (ref 30.0–36.0)
MCV: 89.4 fL (ref 80.0–100.0)
Monocytes Absolute: 0.6 10*3/uL (ref 0.1–1.0)
Monocytes Relative: 8 %
NEUTROS ABS: 4.1 10*3/uL (ref 1.7–7.7)
Neutrophils Relative %: 59 %
Platelets: 198 10*3/uL (ref 150–400)
RBC: 4.89 MIL/uL (ref 4.22–5.81)
RDW: 12.2 % (ref 11.5–15.5)
WBC: 7 10*3/uL (ref 4.0–10.5)
nRBC: 0 % (ref 0.0–0.2)

## 2019-01-07 LAB — LACTATE DEHYDROGENASE: LDH: 197 U/L — ABNORMAL HIGH (ref 98–192)

## 2019-01-07 NOTE — Telephone Encounter (Signed)
Scheduled appt 02/28 los.  Patient aware of appt date and time.

## 2019-02-25 ENCOUNTER — Ambulatory Visit: Payer: BLUE CROSS/BLUE SHIELD | Admitting: Interventional Cardiology

## 2019-07-07 NOTE — Progress Notes (Signed)
HEMATOLOGY/ONCOLOGY CLINIC NOTE  Date of Service: 07/08/19    Patient Care Team: Donald Abe, DO as PCP - General (Internal Medicine)  CHIEF COMPLAINTS/PURPOSE OF CONSULTATION:   F/u for Burkitts lymphoma  Diagnosis: Stage III Burkitt's lymphoma without CNS involvement diagnosed in September 2016.  TREATMENT EPOCH-R + CNS prophylaxis as per NEJM 2013 Dunleavy et al. S/p 6 cycles of EPOCH-R completed 12/17/2015.  s/p 4 doses of intrathecal methotrexate plus hydrocortisone for CNS prophylaxis  INTERVAL HISTORY:  [name] is is here for his scheduled 6 month clinic follow-up for his Burkitt's lymphoma. The patient's last visit with [place] was on 01/07/2019. The pt reports that he is doing well overall.  The pt reports that he has a knot right above the belly button at the site of previous surgery likely scar tissue. It has remained unchanged. No issues with recurrent SBO. No fevers/chills/night sweats/weight loss.   Lab results today (07/08/19) of CBC w/diff and CMP is as follows: all values are WNL except for calcium at 8.4.  On review of systems, pt reports decreased abdominal pain and denies any other symptoms.   MEDICAL HISTORY:   Past Medical History:  Diagnosis Date  . Burkitt lymphoma ([place]) 08/15/2015  . Complication of anesthesia   . EBV infection 2013   Patient notes that he had an EBV infection in 2013 that left him with chronic fatigue. She also notes that he had significant lymphadenopathy and thyroiditis at the time. He has been managing his symptoms with an alternative medicine practitioner in [place] and with other alternative medicines.  . Marijuana use, episodic   . SBO (small bowel obstruction) ([place])     SURGICAL HISTORY: Past Surgical History:  Procedure Laterality Date  . APPENDECTOMY    . LAPAROSCOPY N/A 07/20/2015   Procedure: LAPAROSCOPY DIAGNOSTIC, MULTIPLE BIOPSIES, DRAINAGE OF ASCITES;  Surgeon: Donald Skates, MD;  Location: [facility];  Service: General;  Laterality: N/A;  . TONSILLECTOMY      SOCIAL HISTORY: Social History   Socioeconomic History  . Marital status: Married    Spouse name: Not on file  . Number of children: Not on file  . Years of education: Not on file  . Highest education level: Not on file  Occupational History  . Not on file  Social Needs  . Financial resource strain: Not on file  . Food insecurity    Worry: Not on file    Inability: Not on file  . Transportation needs    Medical: Not on file    Non-medical: Not on file  Tobacco Use  . Smoking status: Never Smoker  . Smokeless tobacco: Never Used  Substance and Sexual Activity  . Alcohol use: No    Alcohol/week: 0.0 standard drinks  . Drug use: Yes    Frequency: 1.0 times per week    Types: Marijuana  . Sexual activity: Yes  Lifestyle  . Physical activity    Days per week: Not on file    Minutes per session: Not on file  . Stress: Not on file  Relationships  . Social Herbalist on phone: Not on file    Gets together: Not on file    Attends religious service: Not on file    Active member of club or organization: Not on file    Attends meetings of clubs or organizations: Not on file    Relationship status: Not on file  . Intimate partner violence  Fear of current or ex partner: Not on file    Emotionally abused: Not on file    Physically abused: Not on file    Forced sexual activity: Not on file  Other Topics Concern  . 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   He has a fiance and has been in a steady relationship.      He has tried to maintain a very healthy lifestyle and cycles about 100 miles a week. He also uses a fair number of over-the-counter alternative medicines to stay healthy.    FAMILY HISTORY: Family History  Problem Relation Age of Onset  . Heart failure Father     ALLERGIES:  is allergic  to levaquin [levofloxacin] and sulfa antibiotics.  MEDICATIONS:  No current outpatient medications on file.   No current facility-administered medications for this visit.    Facility-Administered Medications Ordered in Other Visits  Medication Dose Route Frequency Provider Last Rate Last Dose  . heparin lock flush 100 unit/mL  500 Units Intravenous Once Donald Genera, MD      . sodium chloride flush (NS) 0.9 % injection 10 mL  10 mL Intravenous Once Donald [name], Donald Spring, MD        REVIEW OF SYSTEMS:   A 10+ POINT REVIEW OF SYSTEMS WAS OBTAINED including neurology, dermatology, psychiatry, cardiac, respiratory, lymph, extremities, GI, GU, Musculoskeletal, constitutional, breasts, reproductive, HEENT.  All pertinent positives are noted in the HPI.  All others are negative.     PHYSICAL EXAMINATION: ECOG PERFORMANCE STATUS:0 .BP 121/69 (BP Location: Left Arm, Patient Position: Sitting)   Pulse 63   Temp 98.3 F (36.8 C) (Temporal)   Resp 18   Ht 6' (1.829 m)   Wt 214 lb (97.1 kg)   SpO2 100%   BMI 29.02 kg/m    GENERAL:alert, in no acute distress and comfortable SKIN: no acute rashes, no significant lesions EYES: conjunctiva are pink and non-injected, sclera anicteric OROPHARYNX: MMM, no exudates, no oropharyngeal erythema or ulceration NECK: supple, no JVD LYMPH:  no palpable lymphadenopathy in the cervical, axillary or inguinal regions LUNGS: clear to auscultation b/l with normal respiratory effort HEART: regular rate & rhythm ABDOMEN:  normoactive bowel sounds , non tender, not distended; subcutaneous knot, likely scar tissue, just above the umbilicus.  Extremity: no pedal edema PSYCH: alert & oriented x 3 with fluent speech NEURO: no focal motor/sensory deficits    LABORATORY DATA:  I have reviewed the data as listed  . CBC Latest Ref Rng & Units 07/08/2019 01/07/2019 01/06/2019  WBC 4.0 - 10.5 K/uL 6.5 7.0 8.2  Hemoglobin 13.0 - 17.0 g/dL 14.7 14.3 13.0   Hematocrit 39.0 - 52.0 % 44.5 43.7 39.9  Platelets 150 - 400 K/uL 182 198 174    . CMP Latest Ref Rng & Units 07/08/2019 01/07/2019 01/06/2019  Glucose 70 - 99 mg/dL 95 90 136(H)  BUN 6 - 20 mg/dL 17 15 14   Creatinine 0.61 - 1.24 mg/dL 0.90 0.86 1.00  Sodium 135 - 145 mmol/L 138 139 138  Potassium 3.5 - 5.1 mmol/L 4.0 4.1 3.4(L)  Chloride 98 - 111 mmol/L 104 104 106  CO2 22 - 32 mmol/L 27 26 27   Calcium 8.9 - 10.3 mg/dL 8.4(L) 8.6(L) 8.3(L)  Total Protein 6.5 - 8.1 g/dL 6.6 6.8 -  Total Bilirubin 0.3 - 1.2 mg/dL 0.5 0.5 -  Alkaline Phos 38 - 126  U/L 59 62 -  AST 15 - 41 U/L 25 38 -  ALT 0 - 44 U/L 33 62(H) -   . Lab Results  Component Value Date   LDH 176 07/08/2019    RADIOGRAPHIC STUDIES: No new imaging  ASSESSMENT & PLAN:   Patient is a 43 yo male with  1) High risk Burkitt's lymphoma stage III with no CNS involvement -currently in remission.  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 Patient is status post 6 cycles of EPOCH-R and received 4 doses of intrathecal methotrexate for CNS prophylaxis. That PET CT scan performed 01/07/2016 after completion of planned treatment showed no evidence of residual hypermetabolic disease.  2) Anginal chest pain 1 episodes ACS ruled out in ED. ? SVT vs other arrhythmia vs prinzmetal angina vs atypical CP  3) no Recurrent  Symptoms suggestive of small bowel obstruction since his last clinic visit. Previously has had a few episodes of SBO likely due to adhesions which resolved with conservative management. Has declined surgical intervention and several previous discussions. No new symptoms regarding this since his last visit. Plan -Discussed pt labwork today, 07/08/19; all values are WNL except for calcium at 8.4. -07/08/19 LDH at 176.  -Discussed small knot on stomach; unlikely to be lymph node, more likely a product of biopsy or injection -no clinical or lab  evidence of Burkitts lymphoma recurrence/progression at this time. -Return in 6 months with labs  FOLLOW UP: RTC with Dr Donald [name] with labs in 6 months   The total time spent in the appt was 15 minutes and more than 50% was on counseling and direct patient cares.  All of the patient's questions were answered with apparent satisfaction. The patient knows to call the clinic with any problems, questions or concerns.   Sullivan Lone MD [place] AAHIVMS [facility] Hematology/Oncology Physician [place]  (Office):       [phone] (Work cell):  [phone] (Fax):           [phone]  I, Jacqualyn Posey, am acting as a scribe for Dr. Sullivan Lone.   .I have reviewed the above documentation for accuracy and completeness, and I agree with the above. Donald Genera MD

## 2019-07-08 ENCOUNTER — Telehealth: Payer: Self-pay | Admitting: Hematology

## 2019-07-08 ENCOUNTER — Other Ambulatory Visit: Payer: Self-pay

## 2019-07-08 ENCOUNTER — Inpatient Hospital Stay (HOSPITAL_BASED_OUTPATIENT_CLINIC_OR_DEPARTMENT_OTHER): Payer: BC Managed Care – PPO | Admitting: Hematology

## 2019-07-08 ENCOUNTER — Inpatient Hospital Stay: Payer: BC Managed Care – PPO | Attending: Hematology

## 2019-07-08 VITALS — BP 121/69 | HR 63 | Temp 98.3°F | Resp 18 | Ht 72.0 in | Wt 214.0 lb

## 2019-07-08 DIAGNOSIS — C8373 Burkitt lymphoma, intra-abdominal lymph nodes: Secondary | ICD-10-CM | POA: Diagnosis not present

## 2019-07-08 DIAGNOSIS — C8378 Burkitt lymphoma, lymph nodes of multiple sites: Secondary | ICD-10-CM

## 2019-07-08 DIAGNOSIS — Z9221 Personal history of antineoplastic chemotherapy: Secondary | ICD-10-CM | POA: Diagnosis not present

## 2019-07-08 DIAGNOSIS — C8370 Burkitt lymphoma, unspecified site: Secondary | ICD-10-CM | POA: Diagnosis not present

## 2019-07-08 LAB — CMP (CANCER CENTER ONLY)
ALT: 33 U/L (ref 0–44)
AST: 25 U/L (ref 15–41)
Albumin: 3.8 g/dL (ref 3.5–5.0)
Alkaline Phosphatase: 59 U/L (ref 38–126)
Anion gap: 7 (ref 5–15)
BUN: 17 mg/dL (ref 6–20)
CO2: 27 mmol/L (ref 22–32)
Calcium: 8.4 mg/dL — ABNORMAL LOW (ref 8.9–10.3)
Chloride: 104 mmol/L (ref 98–111)
Creatinine: 0.9 mg/dL (ref 0.61–1.24)
GFR, Est AFR Am: >60 mL/min (ref >60)
GFR, Estimated: >60 mL/min (ref >60)
Glucose, Bld: 95 mg/dL (ref 70–99)
Potassium: 4 mmol/L (ref 3.5–5.1)
Sodium: 138 mmol/L (ref 135–145)
Total Bilirubin: 0.5 mg/dL (ref 0.3–1.2)
Total Protein: 6.6 g/dL (ref 6.5–8.1)

## 2019-07-08 LAB — CBC WITH DIFFERENTIAL/PLATELET
Abs Immature Granulocytes: 0.01 10*3/uL (ref 0.00–0.07)
Basophils Absolute: 0.1 10*3/uL (ref 0.0–0.1)
Basophils Relative: 1 %
Eosinophils Absolute: 0.3 10*3/uL (ref 0.0–0.5)
Eosinophils Relative: 4 %
HCT: 44.5 % (ref 39.0–52.0)
Hemoglobin: 14.7 g/dL (ref 13.0–17.0)
Immature Granulocytes: 0 %
Lymphocytes Relative: 31 %
Lymphs Abs: 2 10*3/uL (ref 0.7–4.0)
MCH: 29.3 pg (ref 26.0–34.0)
MCHC: 33 g/dL (ref 30.0–36.0)
MCV: 88.6 fL (ref 80.0–100.0)
Monocytes Absolute: 0.6 10*3/uL (ref 0.1–1.0)
Monocytes Relative: 9 %
Neutro Abs: 3.5 10*3/uL (ref 1.7–7.7)
Neutrophils Relative %: 55 %
Platelets: 182 10*3/uL (ref 150–400)
RBC: 5.02 MIL/uL (ref 4.22–5.81)
RDW: 11.9 % (ref 11.5–15.5)
WBC: 6.5 10*3/uL (ref 4.0–10.5)
nRBC: 0 % (ref 0.0–0.2)

## 2019-07-08 LAB — LACTATE DEHYDROGENASE: LDH: 176 U/L (ref 98–192)

## 2019-07-08 NOTE — Telephone Encounter (Signed)
Scheduled appt per 8/28 los.  Spoke with patient and he is aware of his appt date and time.

## 2020-01-10 ENCOUNTER — Other Ambulatory Visit: Payer: BC Managed Care – PPO

## 2020-01-10 ENCOUNTER — Telehealth: Payer: Self-pay | Admitting: *Deleted

## 2020-01-10 ENCOUNTER — Ambulatory Visit: Payer: BC Managed Care – PPO | Admitting: Hematology

## 2020-01-10 NOTE — Telephone Encounter (Signed)
Contacted patient regarding missed appt 01/10/20 @ 9a: No answer. Named voice mail, unable to leave message - VMB full.

## 2020-01-31 ENCOUNTER — Ambulatory Visit (HOSPITAL_COMMUNITY)
Admission: EM | Admit: 2020-01-31 | Discharge: 2020-01-31 | Disposition: A | Discharge status: Home / Self Care | Payer: 59 | Attending: Urgent Care | Admitting: Urgent Care

## 2020-01-31 ENCOUNTER — Other Ambulatory Visit: Payer: Self-pay

## 2020-01-31 ENCOUNTER — Encounter (HOSPITAL_COMMUNITY): Payer: Self-pay | Admitting: Emergency Medicine

## 2020-01-31 DIAGNOSIS — L259 Unspecified contact dermatitis, unspecified cause: Secondary | ICD-10-CM

## 2020-01-31 DIAGNOSIS — R21 Rash and other nonspecific skin eruption: Secondary | ICD-10-CM

## 2020-01-31 DIAGNOSIS — L237 Allergic contact dermatitis due to plants, except food: Secondary | ICD-10-CM | POA: Diagnosis not present

## 2020-01-31 MED ORDER — HYDROXYZINE HCL 25 MG PO TABS: 12.5000 mg | ORAL_TABLET | Freq: Three times a day (TID) | ORAL | 0 refills | Status: DC | PRN

## 2020-01-31 MED ORDER — PREDNISONE 20 MG PO TABS: ORAL_TABLET | ORAL | 0 refills | Status: DC

## 2020-01-31 NOTE — ED Provider Notes (Signed)
  [place]   MRN: [id number] DOB: [birth year]  Subjective:   Donald [name] is a 44 y.o. male presenting for 1 week history of persistent and worsening itchy rash.  Patient states that he was doing yard work when the rash started, suspect that it is due to poison ivy.  He states that he tried to wear protective gear but ended up getting the rash anyway.  Symptoms started on his forearms but have since spread to part of his neck his groin area and his legs bilaterally.  Has been using calamine lotion with minimal relief.  Denies fever, shortness of breath, facial swelling, oral involvement, nausea, vomiting, belly pain.  He is not currently taking any medications and has no known food or drug allergies.  Denies past medical and surgical history.  History reviewed. No pertinent family history.  Social History   Tobacco Use  . Smoking status: Never Smoker  Substance Use Topics  . Alcohol use: Yes  . Drug use: Not Currently    ROS   Objective:   Vitals: BP 121/77 (BP Location: Left Arm)   Pulse 80   Temp 98.1 F (36.7 C) (Oral)   Resp 16   SpO2 100%   Physical Exam Constitutional:      General: He is not in acute distress.    Appearance: Normal appearance. He is well-developed and normal weight. He is not ill-appearing, toxic-appearing or diaphoretic.  HENT:     Head: Normocephalic and atraumatic.     Right Ear: External ear normal.     Left Ear: External ear normal.     Nose: Nose normal.     Mouth/Throat:     Pharynx: Oropharynx is clear.  Eyes:     General: No scleral icterus.       Right eye: No discharge.        Left eye: No discharge.     Extraocular Movements: Extraocular movements intact.     Pupils: Pupils are equal, round, and reactive to light.  Cardiovascular:     Rate and Rhythm: Normal rate.  Pulmonary:     Effort: Pulmonary effort is normal.  Musculoskeletal:     Cervical back: Normal range of motion.  Skin:    General: Skin is warm and  dry.     Findings: Rash (patches of urticaria lesions in excoriations diffusely scattered over his wrist bilaterally, left hip/groin area, bilateral lower extremities and left lateral posterior neck) present.  Neurological:     Mental Status: He is alert and oriented to person, place, and time.  Psychiatric:        Mood and Affect: Mood normal.        Behavior: Behavior normal.        Thought Content: Thought content normal.        Judgment: Judgment normal.     Assessment and Plan :   1. Rash and nonspecific skin eruption   2. Contact dermatitis, unspecified contact dermatitis type, unspecified trigger   3. Poison ivy dermatitis     Will use a 15-day steroid course to address contact dermatitis.  Use Vistaril for severe itching. Counseled patient on potential for adverse effects with medications prescribed/recommended today, ER and return-to-clinic precautions discussed, patient verbalized understanding.    Jaynee Eagles, [place] 01/31/20 [phone]

## 2020-01-31 NOTE — ED Triage Notes (Signed)
Patient had been doing yard work and feels certain he was removing poison ivy .    Patient has rash in patches and lines on arms and reports having the same on his legs

## 2020-02-01 ENCOUNTER — Encounter (HOSPITAL_COMMUNITY): Payer: Self-pay | Admitting: Emergency Medicine

## 2020-02-16 ENCOUNTER — Telehealth: Payer: Self-pay | Admitting: *Deleted

## 2020-02-16 NOTE — Telephone Encounter (Signed)
Received message from patient via scheduler asking if he could have covid antibody testing added to next labs at [facility].  Contacted patient to inform that per Dr. Irene Limbo, he would not be able order covid antibody testing for patient with next lab work at [facility] as he is not treating patient for covid related condition. Patient verbalized understanding.

## 2020-02-20 NOTE — Progress Notes (Signed)
HEMATOLOGY/ONCOLOGY CLINIC NOTE  Date of Service: 02/21/20    Patient Care Team: Rosine Abe, DO as PCP - General (Internal Medicine) Shawna Orleans, Doe-Hyun R, DO (Internal Medicine)  CHIEF COMPLAINTS/PURPOSE OF CONSULTATION:   F/u for Burkitts lymphoma  Diagnosis: Stage III Burkitt's lymphoma without CNS involvement diagnosed in September 2016.  TREATMENT EPOCH-R + CNS prophylaxis as per NEJM 2013 Dunleavy et al. S/p 6 cycles of EPOCH-R completed 12/17/2015.  s/p 4 doses of intrathecal methotrexate plus hydrocortisone for CNS prophylaxis  INTERVAL HISTORY: [name] is is here for his scheduled 6 month clinic follow-up for his Burkitt's lymphoma. The patient's last visit with [place] was on 07/08/2019. The pt reports that he is doing well overall.  The pt reports that he has had some difficulty urinating, but it is slowly resolving. He is working on improving his diet and losing weight.   Lab results today (02/21/20) of CBC w/diff and CMP is as follows: all values are WNL except for Calcium at 8.8, ALT at 50. 02/21/2020 LDH at 196  On review of systems, pt reports urinary retention and denies fevers, chills, night sweats, unexpected weight loss, N/V/D, constipation, abdominal pain and any other symptoms.   MEDICAL HISTORY:   Past Medical History:  Diagnosis Date  . Burkitt lymphoma ([place]) 08/15/2015  . Complication of anesthesia   . EBV infection 2013   Patient notes that he had an EBV infection in 2013 that left him with chronic fatigue. She also notes that he had significant lymphadenopathy and thyroiditis at the time. He has been managing his symptoms with an alternative medicine practitioner in [place] and with other alternative medicines.  . Marijuana use, episodic   . SBO (small bowel obstruction) ([place])     SURGICAL HISTORY: Past Surgical History:  Procedure Laterality Date  . APPENDECTOMY    . LAPAROSCOPY N/A 07/20/2015   Procedure: LAPAROSCOPY DIAGNOSTIC,  MULTIPLE BIOPSIES, DRAINAGE OF ASCITES;  Surgeon: Fanny Skates, MD;  Location: [facility];  Service: General;  Laterality: N/A;  . TONSILLECTOMY      SOCIAL HISTORY: Social History   Socioeconomic History  . Marital status: Married    Spouse name: Not on file  . Number of children: Not on file  . Years of education: Not on file  . Highest education level: Not on file  Occupational History  . Not on file  Tobacco Use  . Smoking status: Never Smoker  . Smokeless tobacco: Never Used  Substance and Sexual Activity  . Alcohol use: Yes  . Drug use: Not Currently    Frequency: 1.0 times per week    Types: Marijuana  . Sexual activity: Yes  Other Topics Concern  . 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 He has a fiance and has been in a steady relationship.  He has tried to maintain a very healthy lifestyle and cycles about 100 miles a week. He also uses    a fair number of over-the-counter alternative medicines to stay healthy.   Social Determinants of Health   Financial Resource Strain:   . Difficulty of Paying Living Expenses:   Food Insecurity:   . Worried About Charity fundraiser in the Last Year:   . Arboriculturist in the Last Year:   Transportation Needs:   . Lack of Transportation (  Medical):   [name] Lack of Transportation (Non-Medical):   Physical Activity:   . Days of Exercise per Week:   . Minutes of Exercise per Session:   Stress:   . Feeling of Stress :   Social Connections:   . Frequency of Communication with Friends and Family:   . Frequency of Social Gatherings with Friends and Family:   . Attends Religious Services:   . Active Member of Clubs or Organizations:   . Attends Archivist Meetings:   [name] Marital Status:   Intimate Partner Violence:   . Fear of Current or Ex-Partner:   . Emotionally Abused:   [name]  Physically Abused:   . Sexually Abused:     FAMILY HISTORY: Family History  Problem Relation Age of Onset  . Heart failure Father     ALLERGIES:  is allergic to levaquin [levofloxacin] and sulfa antibiotics.  MEDICATIONS:  No current outpatient medications on file.   No current facility-administered medications for this visit.   Facility-Administered Medications Ordered in Other Visits  Medication Dose Route Frequency Provider Last Rate Last Admin  . heparin lock flush 100 unit/mL  500 Units Intravenous Once Brunetta Genera, MD      . sodium chloride flush (NS) 0.9 % injection 10 mL  10 mL Intravenous Once Irene Limbo, Cloria Spring, MD        REVIEW OF SYSTEMS:   A 10+ POINT REVIEW OF SYSTEMS WAS OBTAINED including neurology, dermatology, psychiatry, cardiac, respiratory, lymph, extremities, GI, GU, Musculoskeletal, constitutional, breasts, reproductive, HEENT.  All pertinent positives are noted in the HPI.  All others are negative.   PHYSICAL EXAMINATION: ECOG PERFORMANCE STATUS:0 .BP 120/61 (BP Location: Left Arm, Patient Position: Sitting)   Pulse (!) 56   Temp 98 F (36.7 C) (Temporal)   Resp 17   Ht 6' (1.829 m)   Wt 219 lb 3.2 oz (99.4 kg)   SpO2 100%   BMI 29.73 kg/m    GENERAL:alert, in no acute distress and comfortable SKIN: no acute rashes, no significant lesions EYES: conjunctiva are pink and non-injected, sclera anicteric OROPHARYNX: MMM, no exudates, no oropharyngeal erythema or ulceration NECK: supple, no JVD LYMPH:  no palpable lymphadenopathy in the cervical, axillary or inguinal regions LUNGS: clear to auscultation b/l with normal respiratory effort HEART: regular rate & rhythm ABDOMEN:  normoactive bowel sounds , non tender, not distended. No palpable hepatosplenomegaly. Extremity: no pedal edema PSYCH: alert & oriented x 3 with fluent speech NEURO: no focal motor/sensory deficits  LABORATORY DATA:  I have reviewed the data as listed  . CBC  Latest Ref Rng & Units 02/21/2020 07/08/2019 01/07/2019  WBC 4.0 - 10.5 K/uL 6.6 6.5 7.0  Hemoglobin 13.0 - 17.0 g/dL 14.9 14.7 14.3  Hematocrit 39.0 - 52.0 % 45.0 44.5 43.7  Platelets 150 - 400 K/uL 172 182 198    . CMP Latest Ref Rng & Units 02/21/2020 07/08/2019 01/07/2019  Glucose 70 - 99 mg/dL 78 95 90  BUN 6 - 20 mg/dL 14 17 15   Creatinine 0.61 - 1.24 mg/dL 1.00 0.90 0.86  Sodium 135 - 145 mmol/L 142 138 139  Potassium 3.5 - 5.1 mmol/L 4.2 4.0 4.1  Chloride 98 - 111 mmol/L 106 104 104  CO2 22 - 32 mmol/L 28 27 26   Calcium 8.9 - 10.3 mg/dL 8.8(L) 8.4(L) 8.6(L)  Total Protein 6.5 - 8.1 g/dL 6.5 6.6 6.8  Total Bilirubin 0.3 - 1.2 mg/dL 0.5 0.5 0.5  Alkaline Phos 38 - 126  U/L 60 59 62  AST 15 - 41 U/L 33 25 38  ALT 0 - 44 U/L 50(H) 33 62(H)   . Lab Results  Component Value Date   LDH 196 (H) 02/21/2020    RADIOGRAPHIC STUDIES: No new imaging  ASSESSMENT & PLAN:   Patient is a 44 yo male with  1) High risk Burkitt's lymphoma stage III with no CNS involvement -currently in remission.  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 Patient is status post 6 cycles of EPOCH-R and received 4 doses of intrathecal methotrexate for CNS prophylaxis. That PET CT scan performed 01/07/2016 after completion of planned treatment showed no evidence of residual hypermetabolic disease.  2) Anginal chest pain 1 episodes ACS ruled out in ED. ? SVT vs other arrhythmia vs prinzmetal angina vs atypical CP  3) no Recurrent  Symptoms suggestive of small bowel obstruction since his last clinic visit. Previously has had a few episodes of SBO likely due to adhesions which resolved with conservative management. Has declined surgical intervention and several previous discussions. No new symptoms regarding this since his last visit.  Plan: -Discussed pt labwork today, 02/21/20; blood counts are nml, ALT is borderline high, other  blood counts look good, LDH is borderline elevated at 196 (like from elevated transaminases) -counseled on decreasing ETOH intake, appropriate diet and exercise. -The pt shows no clinical or lab evidence of Burkitts lymphoma recurrence/progression at this time. -Will continue to monitor with labs and clinic visits  -If labs and symptoms stable at next visit will move to 12 month follow ups. -Recommended pt receive COVID19 vaccine - pt declined -Will see back in 6 months with labs   FOLLOW UP: RTC with Dr Irene Limbo with labs in 6 months   The total time spent in the appt was 20 minutes and more than 50% was on counseling and direct patient cares.  All of the patient's questions were answered with apparent satisfaction. The patient knows to call the clinic with any problems, questions or concerns.   Sullivan Lone MD [place] AAHIVMS [facility] Hematology/Oncology Physician [place]  (Office):       [phone] (Work cell):  [phone] (Fax):           [phone]  I, Yevette Edwards, am acting as a scribe for Dr. Sullivan Lone.   .I have reviewed the above documentation for accuracy and completeness, and I agree with the above. Brunetta Genera MD

## 2020-02-21 ENCOUNTER — Other Ambulatory Visit: Payer: Self-pay

## 2020-02-21 ENCOUNTER — Inpatient Hospital Stay (HOSPITAL_BASED_OUTPATIENT_CLINIC_OR_DEPARTMENT_OTHER): Payer: 59 | Admitting: Hematology

## 2020-02-21 ENCOUNTER — Inpatient Hospital Stay: Payer: 59 | Attending: Hematology

## 2020-02-21 VITALS — BP 120/61 | HR 56 | Temp 98.0°F | Resp 17 | Ht 72.0 in | Wt 219.2 lb

## 2020-02-21 DIAGNOSIS — Z8249 Family history of ischemic heart disease and other diseases of the circulatory system: Secondary | ICD-10-CM | POA: Insufficient documentation

## 2020-02-21 DIAGNOSIS — Z9221 Personal history of antineoplastic chemotherapy: Secondary | ICD-10-CM | POA: Insufficient documentation

## 2020-02-21 DIAGNOSIS — Z8572 Personal history of non-Hodgkin lymphomas: Secondary | ICD-10-CM | POA: Diagnosis present

## 2020-02-21 DIAGNOSIS — C8378 Burkitt lymphoma, lymph nodes of multiple sites: Secondary | ICD-10-CM

## 2020-02-21 DIAGNOSIS — C8373 Burkitt lymphoma, intra-abdominal lymph nodes: Secondary | ICD-10-CM

## 2020-02-21 LAB — CMP (CANCER CENTER ONLY)
ALT: 50 U/L — ABNORMAL HIGH (ref 0–44)
AST: 33 U/L (ref 15–41)
Albumin: 3.7 g/dL (ref 3.5–5.0)
Alkaline Phosphatase: 60 U/L (ref 38–126)
Anion gap: 8 (ref 5–15)
BUN: 14 mg/dL (ref 6–20)
CO2: 28 mmol/L (ref 22–32)
Calcium: 8.8 mg/dL — ABNORMAL LOW (ref 8.9–10.3)
Chloride: 106 mmol/L (ref 98–111)
Creatinine: 1 mg/dL (ref 0.61–1.24)
GFR, Est AFR Am: >60 mL/min (ref >60)
GFR, Estimated: >60 mL/min (ref >60)
Glucose, Bld: 78 mg/dL (ref 70–99)
Potassium: 4.2 mmol/L (ref 3.5–5.1)
Sodium: 142 mmol/L (ref 135–145)
Total Bilirubin: 0.5 mg/dL (ref 0.3–1.2)
Total Protein: 6.5 g/dL (ref 6.5–8.1)

## 2020-02-21 LAB — CBC WITH DIFFERENTIAL/PLATELET
Abs Immature Granulocytes: 0.02 10*3/uL (ref 0.00–0.07)
Basophils Absolute: 0.1 10*3/uL (ref 0.0–0.1)
Basophils Relative: 1 %
Eosinophils Absolute: 0.3 10*3/uL (ref 0.0–0.5)
Eosinophils Relative: 4 %
HCT: 45 % (ref 39.0–52.0)
Hemoglobin: 14.9 g/dL (ref 13.0–17.0)
Immature Granulocytes: 0 %
Lymphocytes Relative: 29 %
Lymphs Abs: 1.9 10*3/uL (ref 0.7–4.0)
MCH: 30 pg (ref 26.0–34.0)
MCHC: 33.1 g/dL (ref 30.0–36.0)
MCV: 90.5 fL (ref 80.0–100.0)
Monocytes Absolute: 0.5 10*3/uL (ref 0.1–1.0)
Monocytes Relative: 7 %
Neutro Abs: 3.9 10*3/uL (ref 1.7–7.7)
Neutrophils Relative %: 59 %
Platelets: 172 10*3/uL (ref 150–400)
RBC: 4.97 MIL/uL (ref 4.22–5.81)
RDW: 12.6 % (ref 11.5–15.5)
WBC: 6.6 10*3/uL (ref 4.0–10.5)
nRBC: 0 % (ref 0.0–0.2)

## 2020-02-21 LAB — LACTATE DEHYDROGENASE: LDH: 196 U/L — ABNORMAL HIGH (ref 98–192)

## 2020-08-23 ENCOUNTER — Inpatient Hospital Stay (HOSPITAL_BASED_OUTPATIENT_CLINIC_OR_DEPARTMENT_OTHER): Payer: 59 | Admitting: Hematology

## 2020-08-23 ENCOUNTER — Inpatient Hospital Stay: Payer: 59 | Admitting: Hematology

## 2020-08-23 ENCOUNTER — Other Ambulatory Visit: Payer: Self-pay

## 2020-08-23 ENCOUNTER — Inpatient Hospital Stay: Payer: 59 | Attending: Hematology

## 2020-08-23 ENCOUNTER — Inpatient Hospital Stay: Payer: 59

## 2020-08-23 VITALS — BP 121/77 | HR 84 | Temp 98.4°F | Resp 18 | Ht 72.0 in | Wt 214.2 lb

## 2020-08-23 DIAGNOSIS — Z9221 Personal history of antineoplastic chemotherapy: Secondary | ICD-10-CM | POA: Insufficient documentation

## 2020-08-23 DIAGNOSIS — Z8249 Family history of ischemic heart disease and other diseases of the circulatory system: Secondary | ICD-10-CM | POA: Insufficient documentation

## 2020-08-23 DIAGNOSIS — C8378 Burkitt lymphoma, lymph nodes of multiple sites: Secondary | ICD-10-CM

## 2020-08-23 DIAGNOSIS — Z8572 Personal history of non-Hodgkin lymphomas: Secondary | ICD-10-CM | POA: Diagnosis present

## 2020-08-23 LAB — CBC WITH DIFFERENTIAL/PLATELET
Abs Immature Granulocytes: 0.02 10*3/uL (ref 0.00–0.07)
Basophils Absolute: 0.1 10*3/uL (ref 0.0–0.1)
Basophils Relative: 1 %
Eosinophils Absolute: 0.1 10*3/uL (ref 0.0–0.5)
Eosinophils Relative: 2 %
HCT: 40.7 % (ref 39.0–52.0)
Hemoglobin: 14.1 g/dL (ref 13.0–17.0)
Immature Granulocytes: 0 %
Lymphocytes Relative: 24 %
Lymphs Abs: 2.1 10*3/uL (ref 0.7–4.0)
MCH: 29.8 pg (ref 26.0–34.0)
MCHC: 34.6 g/dL (ref 30.0–36.0)
MCV: 86 fL (ref 80.0–100.0)
Monocytes Absolute: 0.5 10*3/uL (ref 0.1–1.0)
Monocytes Relative: 6 %
Neutro Abs: 5.9 10*3/uL (ref 1.7–7.7)
Neutrophils Relative %: 67 %
Platelets: 181 10*3/uL (ref 150–400)
RBC: 4.73 MIL/uL (ref 4.22–5.81)
RDW: 11.9 % (ref 11.5–15.5)
WBC: 8.8 10*3/uL (ref 4.0–10.5)
nRBC: 0 % (ref 0.0–0.2)

## 2020-08-23 LAB — CMP (CANCER CENTER ONLY)
ALT: 23 U/L (ref 0–44)
AST: 25 U/L (ref 15–41)
Albumin: 3.8 g/dL (ref 3.5–5.0)
Alkaline Phosphatase: 64 U/L (ref 38–126)
Anion gap: 7 (ref 5–15)
BUN: 21 mg/dL — ABNORMAL HIGH (ref 6–20)
CO2: 27 mmol/L (ref 22–32)
Calcium: 9 mg/dL (ref 8.9–10.3)
Chloride: 107 mmol/L (ref 98–111)
Creatinine: 1.24 mg/dL (ref 0.61–1.24)
GFR, Estimated: >60 mL/min (ref >60)
Glucose, Bld: 105 mg/dL — ABNORMAL HIGH (ref 70–99)
Potassium: 4 mmol/L (ref 3.5–5.1)
Sodium: 141 mmol/L (ref 135–145)
Total Bilirubin: 0.4 mg/dL (ref 0.3–1.2)
Total Protein: 6.5 g/dL (ref 6.5–8.1)

## 2020-08-23 LAB — LACTATE DEHYDROGENASE: LDH: 210 U/L — ABNORMAL HIGH (ref 98–192)

## 2020-08-23 NOTE — Progress Notes (Signed)
HEMATOLOGY/ONCOLOGY CLINIC NOTE  Date of Service: 08/23/20    Patient Care Team: Shawna Orleans, Doe-Hyun Alfonso Patten, DO (Inactive) as PCP - General (Internal Medicine) Shawna Orleans, Doe-Hyun R, DO (Inactive) (Internal Medicine)  CHIEF COMPLAINTS/PURPOSE OF CONSULTATION:   F/u for Burkitts lymphoma  Diagnosis: Stage III Burkitt's lymphoma without CNS involvement diagnosed in September 2016.  TREATMENT EPOCH-R + CNS prophylaxis as per NEJM 2013 Dunleavy et al. S/p 6 cycles of EPOCH-R completed 12/17/2015.  s/p 4 doses of intrathecal methotrexate plus hydrocortisone for CNS prophylaxis  INTERVAL HISTORY: [name] is is here for his scheduled 6 month clinic follow-up for his Burkitt's lymphoma. The patient's last visit with [place] was on 02/21/2020. The pt reports that he is doing well overall.  The pt reports that he is feeling well and staying active. He denies any new symptoms or concerns.   Lab results today (08/23/20) of CBC w/diff and CMP is as follows: all values are WNL except for Glucose at 105, BUN at 21. 08/23/2020 LDH at 210  On review of systems, pt denies fevers, chills, night sweats and any other symptoms.   MEDICAL HISTORY:   Past Medical History:  Diagnosis Date  . Burkitt lymphoma (Donald [name]) 08/15/2015  . Complication of anesthesia   . EBV infection 2013   Patient notes that he had an EBV infection in 2013 that left him with chronic fatigue. She also notes that he had significant lymphadenopathy and thyroiditis at the time. He has been managing his symptoms with an alternative medicine practitioner in [place] and with other alternative medicines.  . Marijuana use, episodic   . SBO (small bowel obstruction) ([place])     SURGICAL HISTORY: Past Surgical History:  Procedure Laterality Date  . APPENDECTOMY    . LAPAROSCOPY N/A 07/20/2015   Procedure: LAPAROSCOPY DIAGNOSTIC, MULTIPLE BIOPSIES, DRAINAGE OF ASCITES;  Surgeon: Fanny Skates, MD;  Location: [facility];  Service: General;   Laterality: N/A;  . TONSILLECTOMY      SOCIAL HISTORY: Social History   Socioeconomic History  . Marital status: Married    Spouse name: Not on file  . Number of children: Not on file  . Years of education: Not on file  . Highest education level: Not on file  Occupational History  . Not on file  Tobacco Use  . Smoking status: Never Smoker  . Smokeless tobacco: Never Used  Vaping Use  . Vaping Use: Never used  Substance and Sexual Activity  . Alcohol use: Yes  . Drug use: Not Currently    Frequency: 1.0 times per week    Types: Marijuana  . Sexual activity: Yes  Other Topics Concern  . 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 He has a fiance and has been in a steady relationship.  He has tried to maintain a very healthy lifestyle and cycles about 100 miles a week. He also uses    a fair number of over-the-counter alternative medicines to stay healthy.   Social Determinants of Health   Financial Resource Strain:   . Difficulty of Paying Living Expenses: Not on file  Food Insecurity:   . Worried About Charity fundraiser in the Last Year: Not on file  . Ran Out of Food in the Last Year: Not on file  Transportation Needs:   . Lack of Transportation (Medical):  Not on file  . Lack of Transportation (Non-Medical): Not on file  Physical Activity:   . Days of Exercise per Week: Not on file  . Minutes of Exercise per Session: Not on file  Stress:   . Feeling of Stress : Not on file  Social Connections:   . Frequency of Communication with Friends and Family: Not on file  . Frequency of Social Gatherings with Friends and Family: Not on file  . Attends Religious Services: Not on file  . Active Member of Clubs or Organizations: Not on file  . Attends Archivist Meetings: Not on file  . Marital Status: Not on file  Intimate Partner  Violence:   . Fear of Current or Ex-Partner: Not on file  . Emotionally Abused: Not on file  . Physically Abused: Not on file  . Sexually Abused: Not on file    FAMILY HISTORY: Family History  Problem Relation Age of Onset  . Heart failure Father     ALLERGIES:  is allergic to levaquin [levofloxacin] and sulfa antibiotics.  MEDICATIONS:  No current outpatient medications on file.   No current facility-administered medications for this visit.   Facility-Administered Medications Ordered in Other Visits  Medication Dose Route Frequency Provider Last Rate Last Admin  . heparin lock flush 100 unit/mL  500 Units Intravenous Once Brunetta Genera, MD      . sodium chloride flush (NS) 0.9 % injection 10 mL  10 mL Intravenous Once Irene Limbo, Cloria Spring, MD        REVIEW OF SYSTEMS:   A 10+ POINT REVIEW OF SYSTEMS WAS OBTAINED including neurology, dermatology, psychiatry, cardiac, respiratory, lymph, extremities, GI, GU, Musculoskeletal, constitutional, breasts, reproductive, HEENT.  All pertinent positives are noted in the HPI.  All others are negative.   PHYSICAL EXAMINATION: ECOG PERFORMANCE STATUS:0 .There were no vitals taken for this visit.   GENERAL:alert, in no acute distress and comfortable SKIN: no acute rashes, no significant lesions EYES: conjunctiva are pink and non-injected, sclera anicteric OROPHARYNX: MMM, no exudates, no oropharyngeal erythema or ulceration NECK: supple, no JVD LYMPH:  no palpable lymphadenopathy in the cervical, axillary or inguinal regions LUNGS: clear to auscultation b/l with normal respiratory effort HEART: regular rate & rhythm ABDOMEN:  normoactive bowel sounds , non tender, not distended. No palpable hepatosplenomegaly.  Extremity: no pedal edema PSYCH: alert & oriented x 3 with fluent speech NEURO: no focal motor/sensory deficits  LABORATORY DATA:  I have reviewed the data as listed  . CBC Latest Ref Rng & Units 02/21/2020 07/08/2019  01/07/2019  WBC 4.0 - 10.5 K/uL 6.6 6.5 7.0  Hemoglobin 13.0 - 17.0 g/dL 14.9 14.7 14.3  Hematocrit 39 - 52 % 45.0 44.5 43.7  Platelets 150 - 400 K/uL 172 182 198    . CMP Latest Ref Rng & Units 02/21/2020 07/08/2019 01/07/2019  Glucose 70 - 99 mg/dL 78 95 90  BUN 6 - 20 mg/dL 14 17 15   Creatinine 0.61 - 1.24 mg/dL 1.00 0.90 0.86  Sodium 135 - 145 mmol/L 142 138 139  Potassium 3.5 - 5.1 mmol/L 4.2 4.0 4.1  Chloride 98 - 111 mmol/L 106 104 104  CO2 22 - 32 mmol/L 28 27 26   Calcium 8.9 - 10.3 mg/dL 8.8(L) 8.4(L) 8.6(L)  Total Protein 6.5 - 8.1 g/dL 6.5 6.6 6.8  Total Bilirubin 0.3 - 1.2 mg/dL 0.5 0.5 0.5  Alkaline Phos 38 - 126 U/L 60 59 62  AST 15 - 41 U/L 33 25 38  ALT 0 - 44 U/L 50(H) 33 62(H)   . Lab Results  Component Value Date   LDH 196 (H) 02/21/2020    RADIOGRAPHIC STUDIES: No new imaging  ASSESSMENT & PLAN:   Patient is a 44 yo male with  1) High risk Burkitt's lymphoma stage III with no CNS involvement -currently in remission.  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 Patient is status post 6 cycles of EPOCH-R and received 4 doses of intrathecal methotrexate for CNS prophylaxis. That PET CT scan performed 01/07/2016 after completion of planned treatment showed no evidence of residual hypermetabolic disease.  2) Anginal chest pain 1 episodes ACS ruled out in ED. ? SVT vs other arrhythmia vs prinzmetal angina vs atypical CP  3) no Recurrent  Symptoms suggestive of small bowel obstruction since his last clinic visit. Previously has had a few episodes of SBO likely due to adhesions which resolved with conservative management. Has declined surgical intervention and several previous discussions. No new symptoms regarding this since his last visit.  Plan: -Discussed pt labwork today, 08/23/20; blood counts and chemistries are nml, LDH is borderline elevated.  -No lab or clinical evidence of  Burkitt's lymphoma recurrence at this time.  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 -Will see back in 6 months with labs.   FOLLOW UP: RTC with Dr Irene Limbo with labs in 6 months   The total time spent in the appt was 20 minutes and more than 50% was on counseling and direct patient cares.  All of the patient's questions were answered with apparent satisfaction. The patient knows to call the clinic with any problems, questions or concerns.   Sullivan Lone MD [place] AAHIVMS [facility] Hematology/Oncology Physician [place]  (Office):       [phone] (Work cell):  [phone] (Fax):           [phone]  I, Yevette Edwards, am acting as a scribe for Dr. Sullivan Lone.   .I have reviewed the above documentation for accuracy and completeness, and I agree with the above. Brunetta Genera MD

## 2021-02-20 ENCOUNTER — Telehealth: Payer: Self-pay | Admitting: Hematology

## 2021-02-20 NOTE — Telephone Encounter (Signed)
Called pt to r/s appt per 4/12 sch msg. No answer. Left msg for pt to call back to r/s.

## 2021-02-21 ENCOUNTER — Inpatient Hospital Stay: Payer: 59

## 2021-02-21 ENCOUNTER — Inpatient Hospital Stay: Payer: 59 | Admitting: Hematology

## 2021-04-01 NOTE — Progress Notes (Incomplete)
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  TREATMENT EPOCH-R + CNS prophylaxis as per NEJM 2013 Dunleavy et al. S/p 6 cycles of EPOCH-R completed 12/17/2015.  s/p 4 doses of intrathecal methotrexate plus hydrocortisone for CNS prophylaxis  INTERVAL HISTORY: Donald [name] is is here for his scheduled 6 month clinic follow-up for his Burkitt's lymphoma. The patient's last visit with [place] was on 10/142021. The pt reports that he is doing well overall.  The pt reports ***  Lab results today 04/02/2021  of CBC w/diff and CMP is as follows: all values are WNL except for ***  On review of systems, pt reports *** and denies *** and any other symptoms.  MEDICAL HISTORY:   Past Medical History:  Diagnosis Date  . Burkitt lymphoma (Donald [name]) 08/15/2015  . Complication of anesthesia   . EBV infection 2013   Patient notes that he had an EBV infection in 2013 that left him with chronic fatigue. She also notes that he had significant lymphadenopathy and thyroiditis at the time. He has been managing his symptoms with an alternative medicine practitioner in [place] and with other alternative medicines.  . Marijuana use, episodic   . SBO (small bowel obstruction) ([place])     SURGICAL HISTORY: Past Surgical History:  Procedure Laterality Date  . APPENDECTOMY    . LAPAROSCOPY N/A 07/20/2015   Procedure: LAPAROSCOPY DIAGNOSTIC, MULTIPLE BIOPSIES, DRAINAGE OF ASCITES;  Surgeon: Fanny Skates, MD;  Location: [facility];  Service: General;  Laterality: N/A;  . TONSILLECTOMY      SOCIAL HISTORY: Social History   Socioeconomic History  . Marital status: Married    Spouse  name: Not on file  . Number of children: Not on file  . Years of education: Not on file  . Highest education level: Not on file  Occupational History  . Not on file  Tobacco Use  . Smoking status: Never Smoker  . Smokeless tobacco: Never Used  Vaping Use  . Vaping Use: Never used  Substance and Sexual Activity  . Alcohol use: Yes  . Drug use: Not Currently    Frequency: 1.0 times per week    Types: Marijuana  . Sexual activity: Yes  Other Topics Concern  . 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 He has a fiance and has been in a steady relationship.  He has tried to maintain a very healthy lifestyle and cycles about 100 miles a week. He also uses    a fair number of over-the-counter alternative medicines to stay healthy.   Social Determinants of Health   Financial Resource Strain: Not on file  Food Insecurity: Not on file  Transportation Needs: Not on file  Physical Activity: Not on file  Stress: Not on file  Social Connections: Not on file  Intimate Partner Violence: Not on file    FAMILY HISTORY: Family History  Problem Relation Age of Onset  . Heart failure Father     ALLERGIES:  is allergic to levaquin [levofloxacin] and sulfa antibiotics.  MEDICATIONS:  No current outpatient  medications on file.   No current facility-administered medications for this visit.   Facility-Administered Medications Ordered in Other Visits  Medication Dose Route Frequency Provider Last Rate Last Admin  . heparin lock flush 100 unit/mL  500 Units Intravenous Once Brunetta Genera, MD      . sodium chloride flush (NS) 0.9 % injection 10 mL  10 mL Intravenous Once Brunetta Genera, MD        REVIEW OF SYSTEMS:   10 Point review of Systems was done is negative except as noted above.  PHYSICAL EXAMINATION: ECOG PERFORMANCE STATUS:0 .There  were no vitals taken for this visit.   *** GENERAL:alert, in no acute distress and comfortable SKIN: no acute rashes, no significant lesions EYES: conjunctiva are pink and non-injected, sclera anicteric OROPHARYNX: MMM, no exudates, no oropharyngeal erythema or ulceration NECK: supple, no JVD LYMPH:  no palpable lymphadenopathy in the cervical, axillary or inguinal regions LUNGS: clear to auscultation b/l with normal respiratory effort HEART: regular rate & rhythm ABDOMEN:  normoactive bowel sounds , non tender, not distended. No palpable hepatosplenomegaly.  Extremity: no pedal edema PSYCH: alert & oriented x 3 with fluent speech NEURO: no focal motor/sensory deficits  LABORATORY DATA:  I have reviewed the data as listed  . CBC Latest Ref Rng & Units 08/23/2020 02/21/2020 07/08/2019  WBC 4.0 - 10.5 K/uL 8.8 6.6 6.5  Hemoglobin 13.0 - 17.0 g/dL 14.1 14.9 14.7  Hematocrit 39.0 - 52.0 % 40.7 45.0 44.5  Platelets 150 - 400 K/uL 181 172 182    . CMP Latest Ref Rng & Units 08/23/2020 02/21/2020 07/08/2019  Glucose 70 - 99 mg/dL 105(H) 78 95  BUN 6 - 20 mg/dL 21(H) 14 17  Creatinine 0.61 - 1.24 mg/dL 1.24 1.00 0.90  Sodium 135 - 145 mmol/L 141 142 138  Potassium 3.5 - 5.1 mmol/L 4.0 4.2 4.0  Chloride 98 - 111 mmol/L 107 106 104  CO2 22 - 32 mmol/L 27 28 27   Calcium 8.9 - 10.3 mg/dL 9.0 8.8(L) 8.4(L)  Total Protein 6.5 - 8.1 g/dL 6.5 6.5 6.6  Total Bilirubin 0.3 - 1.2 mg/dL 0.4 0.5 0.5  Alkaline Phos 38 - 126 U/L 64 60 59  AST 15 - 41 U/L 25 33 25  ALT 0 - 44 U/L 23 50(H) 33   . Lab Results  Component Value Date   LDH 210 (H) 08/23/2020    RADIOGRAPHIC STUDIES: No new imaging  ASSESSMENT & PLAN:   Patient is a 45 yo male with  1) High risk Burkitt's lymphoma stage III with no CNS involvement -currently in remission.  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 Patient is status post 6 cycles  of EPOCH-R and received 4 doses of intrathecal methotrexate for CNS prophylaxis. That PET CT scan performed 01/07/2016 after completion of planned treatment showed no evidence of residual hypermetabolic disease.  2) Anginal chest pain 1 episodes ACS ruled out in ED. ? SVT vs other arrhythmia vs prinzmetal angina vs atypical CP  3) no Recurrent  Symptoms suggestive of small bowel obstruction since his last clinic visit. Previously has had a few episodes of SBO likely due to adhesions which resolved with conservative management. Has declined surgical intervention and several previous discussions. No new symptoms regarding this since his last visit.  Plan: -Discussed pt labwork today, 04/02/2021; ***   -No lab or clinical evidence of Burkitt's lymphoma recurrence at this time.  -Will  see back in ***   FOLLOW UP: ***   The total time spent in the appt was *** minutes and more than 50% was on counseling and direct patient cares.  All of the patient's questions were answered with apparent satisfaction. The patient knows to call the clinic with any problems, questions or concerns.   Sullivan Lone MD [place] AAHIVMS [facility] Hematology/Oncology Physician [place]  (Office):       [phone] (Work cell):  [phone] (Fax):           [phone]  I, Reinaldo Raddle, am acting as scribe for Dr. Sullivan Lone, MD.

## 2021-04-02 ENCOUNTER — Inpatient Hospital Stay: Payer: 59

## 2021-04-02 ENCOUNTER — Telehealth: Payer: Self-pay | Admitting: Hematology

## 2021-04-02 ENCOUNTER — Telehealth: Payer: Self-pay | Admitting: *Deleted

## 2021-04-02 ENCOUNTER — Inpatient Hospital Stay: Payer: 59 | Admitting: Hematology

## 2021-04-02 NOTE — Telephone Encounter (Signed)
Patient did not come to appt for labs or with Dr. Irene Limbo on 5/24 Schedule message sent to contact patient to r/s

## 2021-04-02 NOTE — Telephone Encounter (Signed)
R/s appts per 5/24 sch msg. Called pt, no answer. Left msg with appts date and times.

## 2021-05-28 NOTE — Progress Notes (Signed)
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  TREATMENT EPOCH-[name] + CNS prophylaxis as per NEJM 2013 Dunleavy et al. S/p 6 cycles of EPOCH-[name] completed 12/17/2015.  s/p 4 doses of intrathecal methotrexate plus hydrocortisone for CNS prophylaxis  INTERVAL HISTORY: [name] is is here for his scheduled 6 month clinic follow-up for his Burkitt's lymphoma. The patient's last visit with [place] was on 08/23/2020. The pt reports that he is doing well overall.  The pt reports no has been doing well and has no acute new symptoms. He notes he has gained some weight.  Lab results today 05/29/2021 of CBC w/diff and CMP is as follows: all values are WNL . LDH WNL @ 186.  On review of systems, pt reports no fevers/chills/night sweat  any other symptoms.   MEDICAL HISTORY:   Past Medical History:  Diagnosis Date   Burkitt lymphoma ([place]) 76/12/8313   Complication of anesthesia    EBV infection 2013   Patient notes that he had an EBV infection in 2013 that left him with chronic fatigue. She also notes that he had significant lymphadenopathy and thyroiditis at the time. He has been managing his symptoms with an alternative medicine practitioner in [place] and with other alternative medicines.   Marijuana use, episodic    SBO (small bowel obstruction) ([place])     SURGICAL HISTORY: Past Surgical History:  Procedure Laterality Date   APPENDECTOMY     LAPAROSCOPY N/A 07/20/2015   Procedure: LAPAROSCOPY DIAGNOSTIC, MULTIPLE BIOPSIES, DRAINAGE OF ASCITES;  Surgeon: Donald Skates, MD;  Location: [facility];  Service: General;  Laterality: N/A;   TONSILLECTOMY      SOCIAL  HISTORY: Social History   Socioeconomic History   Marital status: Married    Spouse name: Not on file   Number of children: Not on file   Years of education: Not on file   Highest education level: Not on file  Occupational History   Not on file  Tobacco Use   Smoking status: Never   Smokeless tobacco: Never  Vaping Use   Vaping Use: Never used  Substance and Sexual Activity   Alcohol use: Yes   Drug use: Not Currently    Frequency: 1.0 times per week    Types: Marijuana   Sexual activity: Yes  Other Topics Concern   Not on file  Social History Narrative   ** Merged History Encounter **       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 He has a fiance and has been in a steady relationship.  He has tried to maintain a very healthy lifestyle and cycles about 100 miles a week. He also uses    a fair number of over-the-counter alternative medicines to stay healthy.   Social Determinants of Health   Financial Resource Strain: Not on file  Food Insecurity: Not on file  Transportation Needs: Not on file  Physical Activity: Not on file  Stress: Not on file  Social Connections: Not on file  Intimate Partner Violence: Not on file    FAMILY HISTORY: Family History  Problem Relation Age of Onset   Heart failure Father     ALLERGIES:  is allergic to levaquin [levofloxacin] and sulfa antibiotics.  MEDICATIONS:  No current outpatient medications on file.   No current facility-administered medications for this visit.   Facility-Administered Medications Ordered in Other Visits  Medication Dose Route Frequency Provider Last Rate Last Admin   heparin lock flush 100 unit/mL  500 Units Intravenous Once Donald Genera, MD       sodium chloride flush (NS) 0.9 % injection 10 mL  10 mL Intravenous Once Donald Genera, MD        REVIEW OF SYSTEMS:   10 Point review of Systems was done is negative except as noted  above.  PHYSICAL EXAMINATION: ECOG PERFORMANCE STATUS:0 .There were no vitals taken for this visit.   NAD GENERAL:alert, in no acute distress and comfortable SKIN: no acute rashes, no significant lesions EYES: conjunctiva are pink and non-injected, sclera anicteric OROPHARYNX: MMM, no exudates, no oropharyngeal erythema or ulceration NECK: supple, no JVD LYMPH:  no palpable lymphadenopathy in the cervical, axillary or inguinal regions LUNGS: clear to auscultation b/l with normal respiratory effort HEART: regular rate & rhythm ABDOMEN:  normoactive bowel sounds , non tender, not distended. No palpable hepatosplenomegaly.  Extremity: no pedal edema PSYCH: alert & oriented x 3 with fluent speech NEURO: no focal motor/sensory deficits  LABORATORY DATA:  I have reviewed the data as listed  . CBC Latest Ref Rng & Units 05/29/2021 08/23/2020 02/21/2020  WBC 4.0 - 10.5 K/uL 5.9 8.8 6.6  Hemoglobin 13.0 - 17.0 g/dL 14.0 14.1 14.9  Hematocrit 39.0 - 52.0 % 41.9 40.7 45.0  Platelets 150 - 400 K/uL 181 181 172    . CMP Latest Ref Rng & Units 05/29/2021 08/23/2020 02/21/2020  Glucose 70 - 99 mg/dL 101(H) 105(H) 78  BUN 6 - 20 mg/dL 18 21(H) 14  Creatinine 0.61 - 1.24 mg/dL 0.89 1.24 1.00  Sodium 135 - 145 mmol/L 140 141 142  Potassium 3.5 - 5.1 mmol/L 3.9 4.0 4.2  Chloride 98 - 111 mmol/L 107 107 106  CO2 22 - 32 mmol/L 25 27 28   Calcium 8.9 - 10.3 mg/dL 8.4(L) 9.0 8.8(L)  Total Protein 6.5 - 8.1 g/dL 6.4(L) 6.5 6.5  Total Bilirubin 0.3 - 1.2 mg/dL 0.4 0.4 0.5  Alkaline Phos 38 - 126 U/L 58 64 60  AST 15 - 41 U/L 26 25 33  ALT 0 - 44 U/L 29 23 50(H)   . Lab Results  Component Value Date   LDH 186 05/29/2021    RADIOGRAPHIC STUDIES: No new imaging  ASSESSMENT & PLAN:   Patient is a 45 yo male with  1) High risk Burkitt's lymphoma stage III with no CNS involvement -currently in remission.  Cytogenetics showed Myc Break  apart event associated with chromosomal variants of  Burkitt's lymphoma t(2;8) and t(8;22). Patient is status post 6 cycles of EPOCH-[name] and received 4 doses of intrathecal methotrexate for CNS prophylaxis. That PET CT scan performed 01/07/2016 after completion of planned treatment showed no evidence of residual hypermetabolic disease.  2) Anginal chest pain 1 episodes ACS ruled out in ED. ? SVT vs other arrhythmia vs prinzmetal angina vs atypical CP  3) no Recurrent  Symptoms suggestive of small bowel obstruction since his last clinic visit. Previously has had a few episodes of SBO likely due to adhesions which resolved with conservative management. Has declined surgical intervention and several previous discussions. No new symptoms regarding this since his last visit.  Plan: -Discussed pt labwork today, 05/29/2021; labs wnl -No lab  or clinical evidence of Burkitt's lymphoma recurrence at this time.  - no recurrent symptoms of bowel obstruction -Will see back in 12 months   FOLLOW UP: RTC with Dr Irene Limbo with labs in 12 months   The total time spent in the appt was 20 minutes and more than 50% was on counseling and direct patient cares.  All of the patient's questions were answered with apparent satisfaction. The patient knows to call the clinic with any problems, questions or concerns.   Sullivan Lone MD [place] AAHIVMS [facility] Hematology/Oncology Physician [place]  (Office):       [phone] (Work cell):  [phone] (Fax):           [phone]  I, Reinaldo Raddle, am acting as scribe for Dr. Sullivan Lone, MD.    .I have reviewed the above documentation for accuracy and completeness, and I agree with the above. Donald Genera MD

## 2021-05-29 ENCOUNTER — Other Ambulatory Visit: Payer: Self-pay

## 2021-05-29 ENCOUNTER — Inpatient Hospital Stay: Payer: 59 | Attending: Hematology

## 2021-05-29 ENCOUNTER — Inpatient Hospital Stay (HOSPITAL_BASED_OUTPATIENT_CLINIC_OR_DEPARTMENT_OTHER): Payer: 59 | Admitting: Hematology

## 2021-05-29 VITALS — BP 135/72 | HR 61 | Temp 98.9°F | Resp 17 | Ht 72.0 in | Wt 222.3 lb

## 2021-05-29 DIAGNOSIS — C8378 Burkitt lymphoma, lymph nodes of multiple sites: Secondary | ICD-10-CM

## 2021-05-29 DIAGNOSIS — C8370 Burkitt lymphoma, unspecified site: Secondary | ICD-10-CM | POA: Diagnosis present

## 2021-05-29 DIAGNOSIS — F1290 Cannabis use, unspecified, uncomplicated: Secondary | ICD-10-CM | POA: Insufficient documentation

## 2021-05-29 DIAGNOSIS — Z9221 Personal history of antineoplastic chemotherapy: Secondary | ICD-10-CM | POA: Diagnosis not present

## 2021-05-29 LAB — CBC WITH DIFFERENTIAL/PLATELET
Abs Immature Granulocytes: 0.01 10*3/uL (ref 0.00–0.07)
Basophils Absolute: 0 10*3/uL (ref 0.0–0.1)
Basophils Relative: 1 %
Eosinophils Absolute: 0.3 10*3/uL (ref 0.0–0.5)
Eosinophils Relative: 5 %
HCT: 41.9 % (ref 39.0–52.0)
Hemoglobin: 14 g/dL (ref 13.0–17.0)
Immature Granulocytes: 0 %
Lymphocytes Relative: 35 %
Lymphs Abs: 2 10*3/uL (ref 0.7–4.0)
MCH: 29.4 pg (ref 26.0–34.0)
MCHC: 33.4 g/dL (ref 30.0–36.0)
MCV: 87.8 fL (ref 80.0–100.0)
Monocytes Absolute: 0.5 10*3/uL (ref 0.1–1.0)
Monocytes Relative: 9 %
Neutro Abs: 3 10*3/uL (ref 1.7–7.7)
Neutrophils Relative %: 50 %
Platelets: 181 10*3/uL (ref 150–400)
RBC: 4.77 MIL/uL (ref 4.22–5.81)
RDW: 12 % (ref 11.5–15.5)
WBC: 5.9 10*3/uL (ref 4.0–10.5)
nRBC: 0 % (ref 0.0–0.2)

## 2021-05-29 LAB — CMP (CANCER CENTER ONLY)
ALT: 29 U/L (ref 0–44)
AST: 26 U/L (ref 15–41)
Albumin: 3.6 g/dL (ref 3.5–5.0)
Alkaline Phosphatase: 58 U/L (ref 38–126)
Anion gap: 8 (ref 5–15)
BUN: 18 mg/dL (ref 6–20)
CO2: 25 mmol/L (ref 22–32)
Calcium: 8.4 mg/dL — ABNORMAL LOW (ref 8.9–10.3)
Chloride: 107 mmol/L (ref 98–111)
Creatinine: 0.89 mg/dL (ref 0.61–1.24)
GFR, Estimated: >60 mL/min (ref >60)
Glucose, Bld: 101 mg/dL — ABNORMAL HIGH (ref 70–99)
Potassium: 3.9 mmol/L (ref 3.5–5.1)
Sodium: 140 mmol/L (ref 135–145)
Total Bilirubin: 0.4 mg/dL (ref 0.3–1.2)
Total Protein: 6.4 g/dL — ABNORMAL LOW (ref 6.5–8.1)

## 2021-05-29 LAB — LACTATE DEHYDROGENASE: LDH: 186 U/L (ref 98–192)

## 2021-05-30 ENCOUNTER — Telehealth: Payer: Self-pay | Admitting: Hematology

## 2021-05-30 NOTE — Telephone Encounter (Signed)
Scheduled follow-up appointment per 7/20 los. Patient is aware. 

## 2021-08-22 ENCOUNTER — Emergency Department (HOSPITAL_COMMUNITY): Payer: 59

## 2021-08-22 ENCOUNTER — Emergency Department (HOSPITAL_COMMUNITY)
Admission: EM | Admit: 2021-08-22 | Discharge: 2021-08-22 | Disposition: A | Discharge status: Home / Self Care | Payer: 59 | Attending: Emergency Medicine | Admitting: Emergency Medicine

## 2021-08-22 ENCOUNTER — Encounter (HOSPITAL_COMMUNITY): Payer: Self-pay | Admitting: Emergency Medicine

## 2021-08-22 DIAGNOSIS — M545 Low back pain, unspecified: Secondary | ICD-10-CM | POA: Diagnosis present

## 2021-08-22 LAB — URINALYSIS, ROUTINE W REFLEX MICROSCOPIC
Bilirubin Urine: NEGATIVE
Glucose, UA: NEGATIVE mg/dL
Hgb urine dipstick: NEGATIVE
Ketones, ur: NEGATIVE mg/dL
Leukocytes,Ua: NEGATIVE
Nitrite: NEGATIVE
Protein, ur: NEGATIVE mg/dL
Specific Gravity, Urine: 1.023 (ref 1.005–1.030)
pH: 7 (ref 5.0–8.0)

## 2021-08-22 MED ORDER — HYDROCODONE-ACETAMINOPHEN 5-325 MG PO TABS: 2.0000 | ORAL_TABLET | ORAL | 0 refills | Status: DC | PRN

## 2021-08-22 MED ORDER — PREDNISONE 20 MG PO TABS: ORAL_TABLET | ORAL | 0 refills | Status: DC

## 2021-08-22 MED ORDER — KETOROLAC TROMETHAMINE 30 MG/ML IJ SOLN
30.0000 mg | Freq: Once | INTRAMUSCULAR | Status: AC
  Administered 2021-08-22: 30 mg via INTRAMUSCULAR
  Filled 2021-08-22: qty 1

## 2021-08-22 MED ORDER — LIDOCAINE 5 % EX PTCH: 1.0000 | MEDICATED_PATCH | CUTANEOUS | 0 refills | Status: DC

## 2021-08-22 MED ORDER — DIAZEPAM 5 MG PO TABS
5.0000 mg | ORAL_TABLET | Freq: Once | ORAL | Status: AC
  Administered 2021-08-22: 5 mg via ORAL
  Filled 2021-08-22: qty 1

## 2021-08-22 MED ORDER — MORPHINE SULFATE (PF) 4 MG/ML IV SOLN
6.0000 mg | Freq: Once | INTRAVENOUS | Status: AC
  Administered 2021-08-22: 6 mg via INTRAMUSCULAR
  Filled 2021-08-22: qty 2

## 2021-08-22 NOTE — ED Triage Notes (Addendum)
Patient here from home reporting intermittent lower back spasms. Concerned about history of cancer and epstein barr virus. Ibuprofen with no relief.

## 2021-08-22 NOTE — ED Provider Notes (Signed)
**Note [name]-Identified via Obfuscation** Donald DEPT Provider Note   CSN: [id number] Arrival date & time: 08/22/21  0859     History Chief Complaint  Patient presents with   Back Pain    Donald [name] is a 45 y.o. male.  Patient is a 45 year old male who presents with back pain.  He has a prior history of Burkitt's lymphoma in 2016.  He completed treatment.  He has been cancer free for 5 years.  He reports pain in his left lower back.  He says its been intermittent for about a month.  He says it catches from time to time.  Over the last week he has become more consistent and today it was much more painful than it had been in the past.  He describes it as a spasm in his left mid back.  There is no radiation down his legs.  No numbness or weakness to his extremities.  No loss of bowel or bladder function.  No history of similar symptoms in the past.  No recent traumatic injuries.  He has not really been use anything regularly to treat his symptoms.  Yesterday he did take some ibuprofen.  It is worse with certain movements and worse if he bends his neck over.      Past Medical History:  Diagnosis Date   Burkitt lymphoma ([place]) 20/12/5425   Complication of anesthesia    EBV infection 2013   Patient notes that he had an EBV infection in 2013 that left him with chronic fatigue. She also notes that he had significant lymphadenopathy and thyroiditis at the time. He has been managing his symptoms with an alternative medicine practitioner in [place] and with other alternative medicines.   Marijuana use, episodic    SBO (small bowel obstruction) ([place])     Patient Active Problem List   Diagnosis Date Noted   Irregular heart beat 01/16/2017   Marijuana abuse 01/16/2017   Chemotherapy induced nausea and vomiting    Burkitt lymphoma of intra-abdominal lymph nodes (HCC)    Burkitt's lymphoma of lymph nodes of multiple regions ([place]) 12/03/2015   Acute sinusitis 11/20/2015   Generalized abdominal  pain    Leukocytosis 10/23/2015   SBO (small bowel obstruction) (HCC)    Abdominal pain    Dehydration    Oral thrush    Encounter for chemotherapy management    Gastritis and gastroduodenitis    Cephalalgia    Uncontrollable vomiting    Constipation    Cerebrospinal fluid leak from spinal puncture    Spinal headache    Burkitt lymphoma ([place]) 08/15/2015   Burkitt lymphoma of lymph nodes of multiple regions [facility])    Chemotherapy management, encounter for    CN (constipation)    Dyspnea    Encounter for antineoplastic chemotherapy 07/25/2015   Burkitt's lymphoma ([place]) 07/24/2015   Acute respiratory failure with hypoxemia (HCC)    Acute respiratory failure with hypoxia ([place]) 07/20/2015   ARF (acute renal failure) (HCC)    Elevated LFTs    Lymphoma (HCC)    Abdominal distension    AKI (acute kidney injury) ([place])    Ascites    Mass    Spontaneous tumor lysis syndrome    Pleural effusion 07/15/2015   Pleural effusion, right 07/14/2015   Chronic fatigue syndrome 09/23/2012    Past Surgical History:  Procedure Laterality Date   APPENDECTOMY     LAPAROSCOPY N/A 07/20/2015   Procedure: LAPAROSCOPY DIAGNOSTIC, MULTIPLE BIOPSIES, DRAINAGE OF ASCITES;  Surgeon: Fanny Skates, MD;  Location: [facility];  Service: General;  Laterality: N/A;   TONSILLECTOMY         Family History  Problem Relation Age of Onset   Heart failure Father     Social History   Tobacco Use   Smoking status: Never   Smokeless tobacco: Never  Vaping Use   Vaping Use: Never used  Substance Use Topics   Alcohol use: Yes   Drug use: Not Currently    Frequency: 1.0 times per week    Types: Marijuana    Home Medications Prior to Admission medications   Medication Sig Start Date End Date Taking? Authorizing Provider  HYDROcodone-acetaminophen (NORCO/VICODIN) 5-325 MG tablet Take 2 tablets by mouth every 4 (four) hours as needed. 08/22/21  Yes Malvin Johns, MD  lidocaine (LIDODERM) 5 % Place 1 patch  onto the skin daily. Remove & Discard patch within 12 hours or as directed by MD 08/22/21  Yes Malvin Johns, MD  predniSONE (DELTASONE) 20 MG tablet 3 tabs po day one, then 2 po daily x 4 days 08/22/21  Yes Malvin Johns, MD    Allergies    Levaquin [levofloxacin] and Sulfa antibiotics  Review of Systems   Review of Systems  Constitutional:  Negative for chills, diaphoresis, fatigue and fever.  HENT:  Negative for congestion, rhinorrhea and sneezing.   Eyes: Negative.   Respiratory:  Negative for cough, chest tightness and shortness of breath.   Cardiovascular:  Negative for chest pain and leg swelling.  Gastrointestinal:  Negative for abdominal pain, blood in stool, diarrhea, nausea and vomiting.  Genitourinary:  Negative for difficulty urinating, flank pain, frequency and hematuria.  Musculoskeletal:  Positive for back pain. Negative for arthralgias.  Skin:  Negative for rash.  Neurological:  Negative for dizziness, speech difficulty, weakness, numbness and headaches.   Physical Exam Updated Vital Signs BP (!) 148/86   Pulse (!) 59   Temp 98.1 F (36.7 C) (Oral)   Resp 16   SpO2 99%   Physical Exam Constitutional:      Appearance: He is well-developed.  HENT:     Head: Normocephalic and atraumatic.  Eyes:     Pupils: Pupils are equal, round, and reactive to light.  Cardiovascular:     Rate and Rhythm: Normal rate and regular rhythm.     Heart sounds: Normal heart sounds.  Pulmonary:     Effort: Pulmonary effort is normal. No respiratory distress.     Breath sounds: Normal breath sounds. No wheezing or rales.  Chest:     Chest wall: No tenderness.  Abdominal:     General: Bowel sounds are normal.     Palpations: Abdomen is soft.     Tenderness: There is no abdominal tenderness. There is no guarding or rebound.  Musculoskeletal:        General: Normal range of motion.     Cervical back: Normal range of motion and neck supple.     Comments: Glory Buff is some  muscle spasm in the left mid and lower back.  There is no bony tenderness on the spine step-offs or deformities.  No warmth or erythema.  Negative straight leg raise.  Patellar reflexes intact.  Pedal pulses are intact.  He has normal sensation and motor function distally.  Lymphadenopathy:     Cervical: No cervical adenopathy.  Skin:    General: Skin is warm and dry.     Findings: No rash.  Neurological:     General: No focal deficit present.  Mental Status: He is alert and oriented to person, place, and time.    ED Results / Procedures / Treatments   Labs (all labs ordered are listed, but only abnormal results are displayed) Labs Reviewed  URINALYSIS, ROUTINE W REFLEX MICROSCOPIC    EKG None  Radiology DG Lumbar Spine Complete  Result Date: 08/22/2021 CLINICAL DATA:  Back pain for several days EXAM: LUMBAR SPINE - COMPLETE 4+ VIEW COMPARISON:  CT abdomen/pelvis 01/14/2017 FINDINGS: There are 5 non-rib-bearing lumbar type vertebral bodies. Vertebral body heights are preserved. Alignment is normal. There is no spondylolysis. There is minimal degenerative change. The SI joints are intact. IMPRESSION: No acute findings in the lumbar spine. Electronically Signed   By: Valetta Mole M.D.   On: 08/22/2021 11:33    Procedures Procedures   Medications Ordered in ED Medications  ketorolac (TORADOL) 30 MG/ML injection 30 mg (30 mg Intramuscular Given 08/22/21 1035)  diazepam (VALIUM) tablet 5 mg (5 mg Oral Given 08/22/21 1035)  morphine 4 MG/ML injection 6 mg (6 mg Intramuscular Given 08/22/21 1310)    ED Course  I have reviewed the triage vital signs and the nursing notes.  Pertinent labs & imaging results that were available during my care of the patient were reviewed by me and considered in my medical decision making (see chart for details).    MDM Rules/Calculators/A&P                           Patient is a 45 year old who presents with left-sided back pain.  He has no  radicular symptoms.  No neurologic deficits.  No signs of cauda equina.  No spinal tenderness.  No fevers.  I did imaging studies due to his prior history of lymphoma.  There is no bony lesions noted.  His urinalysis is normal.  There is no suggestions of infection/renal colic.  He has no associated abdominal pain on exam.  His pain has improved slightly with treatment in the ED.  He was discharged home in good condition.  He was given a referral to follow-up with orthopedics.  He was given prescriptions for a prednisone burst, Lidoderm patches and a short course of Vicodin.  Return precautions were given. Final Clinical Impression(s) / ED Diagnoses Final diagnoses:  Acute left-sided low back pain without sciatica    Rx / DC Orders ED Discharge Orders          Ordered    HYDROcodone-acetaminophen (NORCO/VICODIN) 5-325 MG tablet  Every 4 hours PRN        08/22/21 1348    lidocaine (LIDODERM) 5 %  Every 24 hours        08/22/21 1348    predniSONE (DELTASONE) 20 MG tablet        08/22/21 1348             Malvin Johns, MD 08/22/21 1451

## 2021-11-19 DIAGNOSIS — E041 Nontoxic single thyroid nodule: Secondary | ICD-10-CM | POA: Diagnosis not present

## 2021-11-19 DIAGNOSIS — R5381 Other malaise: Secondary | ICD-10-CM | POA: Diagnosis not present

## 2021-11-19 DIAGNOSIS — E039 Hypothyroidism, unspecified: Secondary | ICD-10-CM | POA: Diagnosis not present

## 2021-11-19 DIAGNOSIS — N400 Enlarged prostate without lower urinary tract symptoms: Secondary | ICD-10-CM | POA: Diagnosis not present

## 2022-02-18 DIAGNOSIS — E039 Hypothyroidism, unspecified: Secondary | ICD-10-CM | POA: Diagnosis not present

## 2022-02-18 DIAGNOSIS — N400 Enlarged prostate without lower urinary tract symptoms: Secondary | ICD-10-CM | POA: Diagnosis not present

## 2022-02-18 DIAGNOSIS — R5381 Other malaise: Secondary | ICD-10-CM | POA: Diagnosis not present

## 2022-05-19 ENCOUNTER — Telehealth: Payer: Self-pay | Admitting: Hematology

## 2022-05-19 NOTE — Telephone Encounter (Signed)
Rescheduled upcoming appointment due to provider's PAL. Patient is aware of changes. ?

## 2022-05-27 ENCOUNTER — Inpatient Hospital Stay: Payer: Self-pay

## 2022-05-27 ENCOUNTER — Inpatient Hospital Stay: Payer: Self-pay | Admitting: Hematology

## 2022-06-02 ENCOUNTER — Telehealth: Payer: Self-pay | Admitting: *Deleted

## 2022-06-02 NOTE — Chronic Care Management (AMB) (Signed)
  Care Coordination  Note  06/02/2022 Name: Sasuke Yaffe MRN: [id number] DOB: [birth year]  [name] is a 46 y.o. year old male who is a primary care patient of Shawna Orleans, Doe-Hyun R, DO (Inactive). I reached out to Jobe Gibbon by phone today to offer care coordination services.       Follow up plan: Unsuccessful telephone outreach attempt made. The care guide will reach out to the patient again over the next 7 days. If patient calls provider office to request assistance with care coordination needs, please contact the care guide at the number below.   [place]  Direct Dial: [phone]

## 2022-06-11 NOTE — Chronic Care Management (AMB) (Signed)
  Care Coordination  Outreach Note  06/11/2022 Name: Donald [name] MRN: [id number] DOB: Dec 02, 1975   Care Coordination Outreach Attempts  A second unsuccessful outreach was attempted today to offer the patient with information about available care coordination services as a benefit of their health plan.     Follow Up Plan:  Additional outreach attempts will be made to offer the patient care coordination information and services.   Encounter Outcome:  No Answer  [place]  Direct Dial: [phone]

## 2022-06-23 NOTE — Chronic Care Management (AMB) (Signed)
  Care Coordination  Outreach Note  06/23/2022 Name: Bauer Ausborn MRN: [id number] DOB: 1976/01/15   Care Coordination Outreach Attempts  A third unsuccessful outreach was attempted today to offer the patient with information about available care coordination services as a benefit of their health plan.   Follow Up Plan:  No further outreach attempts will be made at this time. We have been unable to contact the patient to offer or enroll patient in care coordination services  Encounter Outcome:  No Answer   [place]: [phone]

## 2022-07-21 ENCOUNTER — Other Ambulatory Visit: Payer: Self-pay | Admitting: *Deleted

## 2022-07-21 DIAGNOSIS — C8378 Burkitt lymphoma, lymph nodes of multiple sites: Secondary | ICD-10-CM

## 2022-07-22 ENCOUNTER — Inpatient Hospital Stay: Payer: Self-pay | Admitting: Hematology

## 2022-07-22 ENCOUNTER — Inpatient Hospital Stay: Payer: Self-pay

## 2022-07-29 DIAGNOSIS — N400 Enlarged prostate without lower urinary tract symptoms: Secondary | ICD-10-CM | POA: Diagnosis not present

## 2022-07-29 DIAGNOSIS — E559 Vitamin D deficiency, unspecified: Secondary | ICD-10-CM | POA: Diagnosis not present

## 2022-07-29 DIAGNOSIS — R5381 Other malaise: Secondary | ICD-10-CM | POA: Diagnosis not present

## 2022-07-29 DIAGNOSIS — E041 Nontoxic single thyroid nodule: Secondary | ICD-10-CM | POA: Diagnosis not present

## 2022-08-12 ENCOUNTER — Other Ambulatory Visit: Payer: Self-pay

## 2022-08-12 ENCOUNTER — Inpatient Hospital Stay (HOSPITAL_BASED_OUTPATIENT_CLINIC_OR_DEPARTMENT_OTHER): Payer: 59 | Admitting: Hematology

## 2022-08-12 ENCOUNTER — Inpatient Hospital Stay: Payer: 59 | Attending: Hematology

## 2022-08-12 VITALS — BP 126/82 | HR 57 | Temp 98.1°F | Resp 15 | Wt 217.4 lb

## 2022-08-12 DIAGNOSIS — C8378 Burkitt lymphoma, lymph nodes of multiple sites: Secondary | ICD-10-CM

## 2022-08-12 DIAGNOSIS — C8370 Burkitt lymphoma, unspecified site: Secondary | ICD-10-CM | POA: Insufficient documentation

## 2022-08-12 LAB — CBC WITH DIFFERENTIAL (CANCER CENTER ONLY)
Abs Immature Granulocytes: 0.02 10*3/uL (ref 0.00–0.07)
Basophils Absolute: 0 10*3/uL (ref 0.0–0.1)
Basophils Relative: 1 %
Eosinophils Absolute: 0.3 10*3/uL (ref 0.0–0.5)
Eosinophils Relative: 4 %
HCT: 43.1 % (ref 39.0–52.0)
Hemoglobin: 14.6 g/dL (ref 13.0–17.0)
Immature Granulocytes: 0 %
Lymphocytes Relative: 32 %
Lymphs Abs: 2.1 10*3/uL (ref 0.7–4.0)
MCH: 29.7 pg (ref 26.0–34.0)
MCHC: 33.9 g/dL (ref 30.0–36.0)
MCV: 87.6 fL (ref 80.0–100.0)
Monocytes Absolute: 0.5 10*3/uL (ref 0.1–1.0)
Monocytes Relative: 7 %
Neutro Abs: 3.7 10*3/uL (ref 1.7–7.7)
Neutrophils Relative %: 56 %
Platelet Count: 206 10*3/uL (ref 150–400)
RBC: 4.92 MIL/uL (ref 4.22–5.81)
RDW: 11.9 % (ref 11.5–15.5)
WBC Count: 6.6 10*3/uL (ref 4.0–10.5)
nRBC: 0 % (ref 0.0–0.2)

## 2022-08-12 LAB — CMP (CANCER CENTER ONLY)
ALT: 25 U/L (ref 0–44)
AST: 22 U/L (ref 15–41)
Albumin: 4.1 g/dL (ref 3.5–5.0)
Alkaline Phosphatase: 60 U/L (ref 38–126)
Anion gap: 3 — ABNORMAL LOW (ref 5–15)
BUN: 17 mg/dL (ref 6–20)
CO2: 31 mmol/L (ref 22–32)
Calcium: 8.7 mg/dL — ABNORMAL LOW (ref 8.9–10.3)
Chloride: 107 mmol/L (ref 98–111)
Creatinine: 1.07 mg/dL (ref 0.61–1.24)
GFR, Estimated: >60 mL/min (ref >60)
Glucose, Bld: 100 mg/dL — ABNORMAL HIGH (ref 70–99)
Potassium: 4.3 mmol/L (ref 3.5–5.1)
Sodium: 141 mmol/L (ref 135–145)
Total Bilirubin: 0.3 mg/dL (ref 0.3–1.2)
Total Protein: 6.2 g/dL — ABNORMAL LOW (ref 6.5–8.1)

## 2022-08-12 LAB — LACTATE DEHYDROGENASE: LDH: 172 U/L (ref 98–192)

## 2022-08-12 NOTE — Progress Notes (Signed)
HEMATOLOGY/ONCOLOGY CLINIC NOTE  Date of Service: 08/12/22   Patient Care Team: Donald [name], Donald Alfonso Patten, DO (Inactive) as PCP - General (Internal Medicine) Donald [name], Donald R, DO (Inactive) (Internal Medicine)  CHIEF COMPLAINTS/PURPOSE OF CONSULTATION:   F/u for Burkitts lymphoma  Diagnosis: Stage III Burkitt's lymphoma without CNS involvement diagnosed in September 2016.  TREATMENT EPOCH-[name] + CNS prophylaxis as per NEJM 2013 Dunleavy et al. S/p 6 cycles of EPOCH-[name] completed 12/17/2015.  s/p 4 doses of intrathecal methotrexate plus hydrocortisone for CNS prophylaxis  INTERVAL HISTORY:  [name] is is here for follow-up for his Burkitt's lymphoma. The patient's last visit with [place] was on 05/29/2021. He reports He is doing well with no new symptoms or concerns.  He was advised to stay up to date with age appropriate vaccinations. He notes that he does not intend to get the COVID 19 booster or other immunizations. We discussed the risks associated with not be vaccinated and he notes that he understands.  Labs done today were reviewed in detail.  No fever, chills, or night sweats. No new lumps, bumps, or lesions/rashes. No changes in bowel habits. No other new or acute focal symptoms.  MEDICAL HISTORY:   Past Medical History:  Diagnosis Date   Burkitt lymphoma ([name]) 17/02/813   Complication of anesthesia    EBV infection 2013   Patient notes that he had an EBV infection in 2013 that left him with chronic fatigue. She also notes that he had significant lymphadenopathy and thyroiditis at the time. He has been managing his symptoms with an alternative medicine practitioner in [place] and with other alternative medicines.   Marijuana use, episodic    SBO (small bowel obstruction) ([place])     SURGICAL HISTORY: Past Surgical History:  Procedure Laterality Date   APPENDECTOMY     LAPAROSCOPY N/A 07/20/2015   Procedure: LAPAROSCOPY DIAGNOSTIC, MULTIPLE BIOPSIES, DRAINAGE OF  ASCITES;  Surgeon: Fanny Skates, MD;  Location: [facility];  Service: General;  Laterality: N/A;   TONSILLECTOMY      SOCIAL HISTORY: Social History   Socioeconomic History   Marital status: Married    Spouse name: Not on file   Number of children: Not on file   Years of education: Not on file   Highest education level: Not on file  Occupational History   Not on file  Tobacco Use   Smoking status: Never   Smokeless tobacco: Never  Vaping Use   Vaping Use: Never used  Substance and Sexual Activity   Alcohol use: Yes   Drug use: Not Currently    Frequency: 1.0 times per week    Types: Marijuana   Sexual activity: Yes  Other Topics Concern   Not on file  Social History Narrative   ** Merged History Encounter **       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 He has a fiance and has been in a steady relationship.  He has tried to maintain a very healthy lifestyle and cycles about 100 miles a week. He also uses    a fair number of over-the-counter alternative medicines to stay healthy.   Social Determinants of Health   Financial Resource Strain: Not on file  Food Insecurity: Not on file  Transportation Needs: Not on file  Physical Activity: Not on file  Stress: Not on file  Social Connections: Not on file  Intimate Partner Violence: Not on file    FAMILY HISTORY: Family  History  Problem Relation Age of Onset   Heart failure Father     ALLERGIES:  is allergic to levaquin [levofloxacin] and sulfa antibiotics.  MEDICATIONS:  Current Outpatient Medications  Medication Sig Dispense Refill   HYDROcodone-acetaminophen (NORCO/VICODIN) 5-325 MG tablet Take 2 tablets by mouth every 4 (four) hours as needed. 10 tablet 0   lidocaine (LIDODERM) 5 % Place 1 patch onto the skin daily. Remove & Discard patch within 12 hours or as directed by MD 30 patch 0   predniSONE (DELTASONE) 20 MG tablet 3 tabs po day one, then 2 po  daily x 4 days 11 tablet 0   No current facility-administered medications for this visit.   Facility-Administered Medications Ordered in Other Visits  Medication Dose Route Frequency Provider Last Rate Last Admin   heparin lock flush 100 unit/mL  500 Units Intravenous Once Brunetta Genera, MD       sodium chloride flush (NS) 0.9 % injection 10 mL  10 mL Intravenous Once Brunetta Genera, MD        REVIEW OF SYSTEMS:   10 Point review of Systems was done is negative except as noted above.  PHYSICAL EXAMINATION: ECOG PERFORMANCE STATUS:0 .BP 126/82 (BP Location: Left Arm, Patient Position: Sitting)   Pulse (!) 57 Comment: Nurse was notify  Temp 98.1 F (36.7 C) (Temporal)   Resp 15   Wt 217 lb 6.4 oz (98.6 kg)   SpO2 100%   BMI 29.48 kg/m    NAD GENERAL:alert, in no acute distress and comfortable SKIN: no acute rashes, no significant lesions EYES: conjunctiva are pink and non-injected, sclera anicteric NECK: supple, no JVD LYMPH:  no palpable lymphadenopathy in the cervical, axillary or inguinal regions LUNGS: clear to auscultation b/l with normal respiratory effort HEART: regular rate & rhythm ABDOMEN:  normoactive bowel sounds , non tender, not distended. Extremity: no pedal edema PSYCH: alert & oriented x 3 with fluent speech NEURO: no focal motor/sensory deficits  LABORATORY DATA:  I have reviewed the data as listed  .    Latest Ref Rng & Units 08/12/2022    9:40 AM 05/29/2021    9:00 AM 08/23/2020    3:07 PM  CBC  WBC 4.0 - 10.5 K/uL 6.6  5.9  8.8   Hemoglobin 13.0 - 17.0 g/dL 14.6  14.0  14.1   Hematocrit 39.0 - 52.0 % 43.1  41.9  40.7   Platelets 150 - 400 K/uL 206  181  181     .    Latest Ref Rng & Units 08/12/2022    9:40 AM 05/29/2021    9:00 AM 08/23/2020    3:07 PM  CMP  Glucose 70 - 99 mg/dL 100  101  105   BUN 6 - 20 mg/dL '17  18  21   '$ Creatinine 0.61 - 1.24 mg/dL 1.07  0.89  1.24   Sodium 135 - 145 mmol/L 141  140  141   Potassium  3.5 - 5.1 mmol/L 4.3  3.9  4.0   Chloride 98 - 111 mmol/L 107  107  107   CO2 22 - 32 mmol/L '31  25  27   '$ Calcium 8.9 - 10.3 mg/dL 8.7  8.4  9.0   Total Protein 6.5 - 8.1 g/dL 6.2  6.4  6.5   Total Bilirubin 0.3 - 1.2 mg/dL 0.3  0.4  0.4   Alkaline Phos 38 - 126 U/L 60  58  64   AST 15 - 41  U/L '22  26  25   '$ ALT 0 - 44 U/L '25  29  23    '$ . Lab Results  Component Value Date   LDH 172 08/12/2022    RADIOGRAPHIC STUDIES: No new imaging  ASSESSMENT & PLAN:   Patient is a 46 yo male with  1) High risk Burkitt's lymphoma stage III with no CNS involvement -currently in remission.  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 Patient is status post 6 cycles of EPOCH-[name] and received 4 doses of intrathecal methotrexate for CNS prophylaxis. That PET CT scan performed 01/07/2016 after completion of planned treatment showed no evidence of residual hypermetabolic disease.  2) Anginal chest pain 1 episodes ACS ruled out in ED. ? SVT vs other arrhythmia vs prinzmetal angina vs atypical CP  3) no Recurrent  Symptoms suggestive of small bowel obstruction since his last clinic visit. Previously has had a few episodes of SBO likely due to adhesions which resolved with conservative management. Has declined surgical intervention and several previous discussions. No new symptoms regarding this since his last visit.  Plan: -Discussed pt labwork today, 08/12/2022; CBC w/diff and CMP is as follows: all values are WNL . LDH WNL @ 172. -No lab or clinical evidence of Burkitt's lymphoma recurrence at this time.  - no recurrent symptoms of bowel obstruction -Will see back in 12 months -He was advised to stay up to date with age appropriate vaccinations. He notes that he does not intend to get the COVID 19 booster or other immunizations. We discussed the risks associated with not be vaccinated and he notes that he understands.   FOLLOW  UP: RTC with Dr Irene Limbo with labs in 12 months   The total time spent in the appt was 20 minutes and more than 50% was on counseling and direct patient cares.  All of the patient's questions were answered with apparent satisfaction. The patient knows to call the clinic with any problems, questions or concerns.   Sullivan Lone MD [place] AAHIVMS [facility] Hematology/Oncology Physician [place]  (Office):       [phone] (Work cell):  [phone] (Fax):           [phone]  I, Melene Muller, am acting as scribe for Dr. Sullivan Lone, MD.  .I have reviewed the above documentation for accuracy and completeness, and I agree with the above. Brunetta Genera MD

## 2022-08-15 ENCOUNTER — Telehealth: Payer: Self-pay | Admitting: Hematology

## 2022-08-15 NOTE — Telephone Encounter (Signed)
Unable to leave message with follow-up appointment per 10/3 los. Mailed calendar.

## 2022-10-14 DIAGNOSIS — R7309 Other abnormal glucose: Secondary | ICD-10-CM | POA: Diagnosis not present

## 2022-10-14 DIAGNOSIS — B349 Viral infection, unspecified: Secondary | ICD-10-CM | POA: Diagnosis not present

## 2022-10-14 DIAGNOSIS — R5383 Other fatigue: Secondary | ICD-10-CM | POA: Diagnosis not present

## 2022-10-14 DIAGNOSIS — E041 Nontoxic single thyroid nodule: Secondary | ICD-10-CM | POA: Diagnosis not present

## 2022-10-14 DIAGNOSIS — R7989 Other specified abnormal findings of blood chemistry: Secondary | ICD-10-CM | POA: Diagnosis not present

## 2023-01-27 DIAGNOSIS — R7309 Other abnormal glucose: Secondary | ICD-10-CM | POA: Diagnosis not present

## 2023-01-27 DIAGNOSIS — R5383 Other fatigue: Secondary | ICD-10-CM | POA: Diagnosis not present

## 2023-01-27 DIAGNOSIS — B349 Viral infection, unspecified: Secondary | ICD-10-CM | POA: Diagnosis not present

## 2023-01-27 DIAGNOSIS — R7989 Other specified abnormal findings of blood chemistry: Secondary | ICD-10-CM | POA: Diagnosis not present

## 2023-01-27 DIAGNOSIS — E041 Nontoxic single thyroid nodule: Secondary | ICD-10-CM | POA: Diagnosis not present

## 2023-08-17 ENCOUNTER — Other Ambulatory Visit: Payer: Self-pay

## 2023-08-17 DIAGNOSIS — C8378 Burkitt lymphoma, lymph nodes of multiple sites: Secondary | ICD-10-CM

## 2023-08-18 ENCOUNTER — Inpatient Hospital Stay: Payer: 59 | Attending: Hematology | Admitting: Hematology

## 2023-08-18 ENCOUNTER — Inpatient Hospital Stay: Payer: 59

## 2023-08-18 VITALS — BP 114/72 | HR 59 | Temp 97.9°F | Resp 20 | Wt 216.4 lb

## 2023-08-18 DIAGNOSIS — C8378 Burkitt lymphoma, lymph nodes of multiple sites: Secondary | ICD-10-CM

## 2023-08-18 DIAGNOSIS — Z8572 Personal history of non-Hodgkin lymphomas: Secondary | ICD-10-CM | POA: Insufficient documentation

## 2023-08-18 LAB — CBC WITH DIFFERENTIAL (CANCER CENTER ONLY)
Abs Immature Granulocytes: 0.01 10*3/uL (ref 0.00–0.07)
Basophils Absolute: 0.1 10*3/uL (ref 0.0–0.1)
Basophils Relative: 1 %
Eosinophils Absolute: 0.3 10*3/uL (ref 0.0–0.5)
Eosinophils Relative: 5 %
HCT: 43.6 % (ref 39.0–52.0)
Hemoglobin: 14.5 g/dL (ref 13.0–17.0)
Immature Granulocytes: 0 %
Lymphocytes Relative: 35 %
Lymphs Abs: 2 10*3/uL (ref 0.7–4.0)
MCH: 29.7 pg (ref 26.0–34.0)
MCHC: 33.3 g/dL (ref 30.0–36.0)
MCV: 89.3 fL (ref 80.0–100.0)
Monocytes Absolute: 0.4 10*3/uL (ref 0.1–1.0)
Monocytes Relative: 7 %
Neutro Abs: 2.9 10*3/uL (ref 1.7–7.7)
Neutrophils Relative %: 52 %
Platelet Count: 189 10*3/uL (ref 150–400)
RBC: 4.88 MIL/uL (ref 4.22–5.81)
RDW: 11.9 % (ref 11.5–15.5)
WBC Count: 5.7 10*3/uL (ref 4.0–10.5)
nRBC: 0 % (ref 0.0–0.2)

## 2023-08-18 LAB — CMP (CANCER CENTER ONLY)
ALT: 34 U/L (ref 0–44)
AST: 40 U/L (ref 15–41)
Albumin: 4 g/dL (ref 3.5–5.0)
Alkaline Phosphatase: 56 U/L (ref 38–126)
Anion gap: 4 — ABNORMAL LOW (ref 5–15)
BUN: 20 mg/dL (ref 6–20)
CO2: 33 mmol/L — ABNORMAL HIGH (ref 22–32)
Calcium: 8.8 mg/dL — ABNORMAL LOW (ref 8.9–10.3)
Chloride: 105 mmol/L (ref 98–111)
Creatinine: 0.99 mg/dL (ref 0.61–1.24)
GFR, Estimated: >60 mL/min (ref >60)
Glucose, Bld: 108 mg/dL — ABNORMAL HIGH (ref 70–99)
Potassium: 4.2 mmol/L (ref 3.5–5.1)
Sodium: 142 mmol/L (ref 135–145)
Total Bilirubin: 0.5 mg/dL (ref 0.3–1.2)
Total Protein: 6.3 g/dL — ABNORMAL LOW (ref 6.5–8.1)

## 2023-08-18 LAB — LACTATE DEHYDROGENASE: LDH: 162 U/L (ref 98–192)

## 2023-08-18 NOTE — Progress Notes (Signed)
HEMATOLOGY/ONCOLOGY CLINIC NOTE  Date of Service: 08/18/23   Patient Care Team: Artist Pais, Doe-Hyun Elvera Lennox, DO (Inactive) as PCP - General (Internal Medicine) Artist Pais, Doe-Hyun R, DO (Inactive) (Internal Medicine)  CHIEF COMPLAINTS/PURPOSE OF CONSULTATION:   F/u for Burkitts lymphoma  Diagnosis: Stage III Burkitt's lymphoma without CNS involvement diagnosed in September 2016.  TREATMENT EPOCH-R + CNS prophylaxis as per [facility] 2013 Dunleavy et al. S/p 6 cycles of EPOCH-R completed 12/17/2015.  s/p 4 doses of intrathecal methotrexate plus hydrocortisone for CNS prophylaxis  INTERVAL HISTORY:  [name] is is here for follow-up for his Burkitt's lymphoma. Patient was last seen by me on 08/12/2022 and was doing well overall with no new medical concerns.  Today, he reports that he has been feeling well overall since is last clinical visit. Patient denies any new medications or hospitalizations. He notes that his abdomen is sensitive to touch on the exterior, but this is not causing any obstructive symptoms like previously. Patient is eating well and continues to exercise regularly. He denies any fever, chills, night sweats, new lumps/bumps, or other new localizing symptoms.   Patient reports fluctuating lingering effects from Epstein-Barr virus involving fatigue once every couple of months. Patient denies having any colds recently.   Patient reports that his blood glucose levels have been borderline high recently despite monitoring his diet. He denies any levels over 120-125 fasting. He has no other new concerns. He does not take vitamin D at this time.   MEDICAL HISTORY:   Past Medical History:  Diagnosis Date   Burkitt lymphoma (HCC) 08/15/2015   Complication of anesthesia    EBV infection 2013   Patient notes that he had an EBV infection in 2013 that left him with chronic fatigue. She also notes that he had significant lymphadenopathy and thyroiditis at the time. He has been managing his  symptoms with an alternative medicine practitioner in [place] and with other alternative medicines.   Marijuana use, episodic    SBO (small bowel obstruction) (HCC)     SURGICAL HISTORY: Past Surgical History:  Procedure Laterality Date   APPENDECTOMY     LAPAROSCOPY N/A 07/20/2015   Procedure: LAPAROSCOPY DIAGNOSTIC, MULTIPLE BIOPSIES, DRAINAGE OF ASCITES;  Surgeon: Claud Kelp, MD;  Location: [facility];  Service: General;  Laterality: N/A;   TONSILLECTOMY      SOCIAL HISTORY: Social History   Socioeconomic History   Marital status: Married    Spouse name: Not on file   Number of children: Not on file   Years of education: Not on file   Highest education level: Not on file  Occupational History   Not on file  Tobacco Use   Smoking status: Never   Smokeless tobacco: Never  Vaping Use   Vaping status: Never Used  Substance and Sexual Activity   Alcohol use: Yes   Drug use: Not Currently    Frequency: 1.0 times per week    Types: Marijuana   Sexual activity: Yes  Other Topics Concern   Not on file  Social History Narrative   ** Merged History Encounter **       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 He has a fiance and has been in a steady relationship.  He has tried to maintain a very healthy lifestyle and cycles about 100 miles a week. He also uses    a fair number of over-the-counter alternative medicines to stay healthy.   Social Determinants  of Health   Financial Resource Strain: Not on file  Food Insecurity: Not on file  Transportation Needs: Not on file  Physical Activity: Not on file  Stress: Not on file  Social Connections: Unknown (03/24/2022)   Received from [facility], [facility]   Social Network    Social Network: Not on file  Intimate Partner Violence: Unknown (02/13/2022)   Received from [facility], [facility]   HITS    Physically Hurt: Not on file    Insult or Talk Down  To: Not on file    Threaten Physical Harm: Not on file    Scream or Curse: Not on file    FAMILY HISTORY: Family History  Problem Relation Age of Onset   Heart failure Father     ALLERGIES:  is allergic to levaquin [levofloxacin] and sulfa antibiotics.  MEDICATIONS:  Current Outpatient Medications  Medication Sig Dispense Refill   HYDROcodone-acetaminophen (NORCO/VICODIN) 5-325 MG tablet Take 2 tablets by mouth every 4 (four) hours as needed. 10 tablet 0   lidocaine (LIDODERM) 5 % Place 1 patch onto the skin daily. Remove & Discard patch within 12 hours or as directed by MD 30 patch 0   predniSONE (DELTASONE) 20 MG tablet 3 tabs po day one, then 2 po daily x 4 days 11 tablet 0   No current facility-administered medications for this visit.   Facility-Administered Medications Ordered in Other Visits  Medication Dose Route Frequency Provider Last Rate Last Admin   heparin lock flush 100 unit/mL  500 Units Intravenous Once Johney Maine, MD       sodium chloride flush (NS) 0.9 % injection 10 mL  10 mL Intravenous Once Johney Maine, MD        REVIEW OF SYSTEMS:    10 Point review of Systems was done is negative except as noted above.   PHYSICAL EXAMINATION: ECOG PERFORMANCE STATUS:0 .BP 114/72   Pulse (!) 59   Temp 97.9 F (36.6 C)   Resp 20   Wt 216 lb 6.4 oz (98.2 kg)   SpO2 99%   BMI 29.35 kg/m    GENERAL:alert, in no acute distress and comfortable SKIN: no acute rashes, no significant lesions EYES: conjunctiva are pink and non-injected, sclera anicteric OROPHARYNX: MMM, no exudates, no oropharyngeal erythema or ulceration NECK: supple, no JVD LYMPH:  no palpable lymphadenopathy in the cervical, axillary or inguinal regions LUNGS: clear to auscultation b/l with normal respiratory effort HEART: regular rate & rhythm ABDOMEN:  normoactive bowel sounds , non tender, not distended. Extremity: no pedal edema PSYCH: alert & oriented x 3 with fluent  speech NEURO: no focal motor/sensory deficits   LABORATORY DATA:  I have reviewed the data as listed  .    Latest Ref Rng & Units 08/12/2022    9:40 AM 05/29/2021    9:00 AM 08/23/2020    3:07 PM  CBC  WBC 4.0 - 10.5 K/uL 6.6  5.9  8.8   Hemoglobin 13.0 - 17.0 g/dL 81.1  91.4  78.2   Hematocrit 39.0 - 52.0 % 43.1  41.9  40.7   Platelets 150 - 400 K/uL 206  181  181     .    Latest Ref Rng & Units 08/12/2022    9:40 AM 05/29/2021    9:00 AM 08/23/2020    3:07 PM  CMP  Glucose 70 - 99 mg/dL 956  213  086   BUN 6 - 20 mg/dL 17  18  21   Creatinine 0.61 - 1.24 mg/dL 1.61  0.96  0.45   Sodium 135 - 145 mmol/L 141  140  141   Potassium 3.5 - 5.1 mmol/L 4.3  3.9  4.0   Chloride 98 - 111 mmol/L 107  107  107   CO2 22 - 32 mmol/L 31  25  27    Calcium 8.9 - 10.3 mg/dL 8.7  8.4  9.0   Total Protein 6.5 - 8.1 g/dL 6.2  6.4  6.5   Total Bilirubin 0.3 - 1.2 mg/dL 0.3  0.4  0.4   Alkaline Phos 38 - 126 U/L 60  58  64   AST 15 - 41 U/L 22  26  25    ALT 0 - 44 U/L 25  29  23     . Lab Results  Component Value Date   LDH 172 08/12/2022    RADIOGRAPHIC STUDIES: No new imaging  ASSESSMENT & PLAN:   Patient is a 47 yo male with  1) High risk Burkitt's lymphoma stage III with no CNS involvement -currently in remission.  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 Patient is status post 6 cycles of EPOCH-R and received 4 doses of intrathecal methotrexate for CNS prophylaxis. That PET CT scan performed 01/07/2016 after completion of planned treatment showed no evidence of residual hypermetabolic disease.  2) Anginal chest pain 1 episodes ACS ruled out in ED. ? SVT vs other arrhythmia vs prinzmetal angina vs atypical CP  3) no Recurrent  Symptoms suggestive of small bowel obstruction since his last clinic visit. Previously has had a few episodes of SBO likely due to adhesions which resolved with conservative  management. Has declined surgical intervention and several previous discussions. No new symptoms regarding this since his last visit.  PLAN:   -Discussed lab results on 08/18/23 in detail with patient. CBC showed WBC of 5.7K, hemoglobin of 14.5, and platelets of 189K. -CMP stable. Glucose level 108 mg/dL. Patient notes that he did not fast this morning.  -No lab or clinical evidence of Burkitt's lymphoma recurrence at this time.  - no recurrent symptoms of bowel obstruction -recommend patient to take 2000 units of vitamin D,  -recommend patient to regularly stay hydrated -Will see back in 12 months   FOLLOW UP: RTC with Dr Candise Che with labs in 12 months  The total time spent in the appointment was 20 minutes* .  All of the patient's questions were answered with apparent satisfaction. The patient knows to call the clinic with any problems, questions or concerns.   Wyvonnia Lora MD MS AAHIVMS [facility] Hematology/Oncology Physician [facility]  .*Total Encounter Time as defined by the Centers for Medicare and Medicaid Services includes, in addition to the face-to-face time of a patient visit (documented in the note above) non-face-to-face time: obtaining and reviewing outside history, ordering and reviewing medications, tests or procedures, care coordination (communications with other health care professionals or caregivers) and documentation in the medical record.    I,Mitra Faeizi,acting as a Neurosurgeon for Wyvonnia Lora, MD.,have documented all relevant documentation on the behalf of Wyvonnia Lora, MD,as directed by  Wyvonnia Lora, MD while in the presence of Wyvonnia Lora, MD.  .I have reviewed the above documentation for accuracy and completeness, and I agree with the above. Johney Maine MD

## 2024-08-22 ENCOUNTER — Other Ambulatory Visit: Payer: Self-pay

## 2024-08-22 DIAGNOSIS — C8378 Burkitt lymphoma, lymph nodes of multiple sites: Secondary | ICD-10-CM

## 2024-08-23 ENCOUNTER — Inpatient Hospital Stay: Payer: 59 | Attending: Hematology

## 2024-08-23 ENCOUNTER — Inpatient Hospital Stay: Payer: 59 | Admitting: Hematology

## 2024-08-23 VITALS — BP 125/79 | HR 83 | Temp 97.7°F | Resp 20 | Wt 216.6 lb

## 2024-08-23 DIAGNOSIS — C8378 Burkitt lymphoma, lymph nodes of multiple sites: Secondary | ICD-10-CM | POA: Diagnosis not present

## 2024-08-23 DIAGNOSIS — C837A Burkitt lymphoma, in remission: Secondary | ICD-10-CM | POA: Diagnosis not present

## 2024-08-23 LAB — CBC WITH DIFFERENTIAL (CANCER CENTER ONLY)
Abs Immature Granulocytes: 0.01 K/uL (ref 0.00–0.07)
Basophils Absolute: 0.1 K/uL (ref 0.0–0.1)
Basophils Relative: 1 %
Eosinophils Absolute: 0.2 K/uL (ref 0.0–0.5)
Eosinophils Relative: 4 %
HCT: 42.5 % (ref 39.0–52.0)
Hemoglobin: 14.8 g/dL (ref 13.0–17.0)
Immature Granulocytes: 0 %
Lymphocytes Relative: 31 %
Lymphs Abs: 1.8 K/uL (ref 0.7–4.0)
MCH: 29.7 pg (ref 26.0–34.0)
MCHC: 34.8 g/dL (ref 30.0–36.0)
MCV: 85.3 fL (ref 80.0–100.0)
Monocytes Absolute: 0.5 K/uL (ref 0.1–1.0)
Monocytes Relative: 8 %
Neutro Abs: 3.1 K/uL (ref 1.7–7.7)
Neutrophils Relative %: 56 %
Platelet Count: 185 K/uL (ref 150–400)
RBC: 4.98 MIL/uL (ref 4.22–5.81)
RDW: 11.9 % (ref 11.5–15.5)
WBC Count: 5.6 K/uL (ref 4.0–10.5)
nRBC: 0 % (ref 0.0–0.2)

## 2024-08-23 LAB — CMP (CANCER CENTER ONLY)
ALT: 36 U/L (ref 0–44)
AST: 29 U/L (ref 15–41)
Albumin: 4.2 g/dL (ref 3.5–5.0)
Alkaline Phosphatase: 55 U/L (ref 38–126)
Anion gap: 4 — ABNORMAL LOW (ref 5–15)
BUN: 23 mg/dL — ABNORMAL HIGH (ref 6–20)
CO2: 28 mmol/L (ref 22–32)
Calcium: 9.2 mg/dL (ref 8.9–10.3)
Chloride: 105 mmol/L (ref 98–111)
Creatinine: 0.93 mg/dL (ref 0.61–1.24)
GFR, Estimated: >60 mL/min (ref >60)
Glucose, Bld: 94 mg/dL (ref 70–99)
Potassium: 4.1 mmol/L (ref 3.5–5.1)
Sodium: 137 mmol/L (ref 135–145)
Total Bilirubin: 0.5 mg/dL (ref 0.0–1.2)
Total Protein: 6.5 g/dL (ref 6.5–8.1)

## 2024-08-23 LAB — LACTATE DEHYDROGENASE: LDH: 154 U/L (ref 98–192)

## 2024-08-23 NOTE — Progress Notes (Signed)
 HEMATOLOGY/ONCOLOGY CLINIC NOTE  Date of Service: 08/23/24   Patient Care Team: [name], Donald JONELLE, DO (Inactive) as PCP - General (Internal Medicine) [name], Donald R, DO (Inactive) (Internal Medicine)  CHIEF COMPLAINTS/PURPOSE OF CONSULTATION:  F/u for Burkitts lymphoma  HISTORY OF PRESENTING ILLNESS:  (08/03/2015) [name] is a wonderful 48 year old otherwise healthy gentleman was seen in the hospital for new diagnosis of high-risk Burkitt's lymphoma without CNS involvement. He presented with a large right-sided pleural effusion and abdominal adenopathy, omental nodules involvement. Lumbar puncture was negative. The bone marrow was negative.  He tolerated first cycle of EPOCh-R very well with no immediate acute toxicities.He is here for his 1 week posthospitalization follow-up for toxicity check. He notes some pleuritic chest pain on the right side with deep inspiration which is improving. Chest x-ray showed much improved pleural effusion. No fevers or chills. Abdominal distention improved. Blood counts stable. His allopurinol  was discontinued. Had significant bone pains from his Neulasta . Given adequate pain medications. Counseled to maintain good bowel prophylaxis.  Diagnosis: Stage III Burkitt's lymphoma without CNS involvement diagnosed in September 2016.  TREATMENT EPOCH-R + CNS prophylaxis as per [facility] 2013 Dunleavy et al. S/p 6 cycles of EPOCH-R completed 12/17/2015.  s/p 4 doses of intrathecal methotrexate  plus hydrocortisone  for CNS prophylaxis  INTERVAL HISTORY:  [name] is is here for follow-up for his Burkitt's lymphoma.  Patient was last seen by me on 08/18/2023, and at the time he was doing well overall, though stated that his abdomen was sensitive to touch on the exterior, but was not causing any obstructive symptoms. Also reported fluctuating lingering effects from Epstein-Barr virus involving fatigue once every couple of months. Noted his blood glucose  levels had been borderline high despite monitoring his diet - no levels over 120-125 fasting. He has no other new concerns. He does not take vitamin D at this time.   Today, he says that he has been doing well. Has been busy taking care of his 17-77 month old son, and recently moved to [facility]. Endorses pain in his left arm, but attributes this to doing lots of work and taking care of his child. Denies any new fevers/chills, rashes, night sweats, unexpected weight loss, new lump/bump, abdominal pain, bowel issues, or testicular pain/swelling. He does endorse the persistence of the exterior sensitivity to his abdomen around the site of his biopsy when touched.  Reports that he has quit drinking entirely.  States he does not have a PCP currently. No significant Fhx:  Paternal grand-father died from heart attack  Father does not have hx of cholesterol issues Mother fairly healthy overall.  MEDICAL HISTORY:  Past Medical History:  Diagnosis Date   Burkitt lymphoma (HCC) 08/15/2015   Complication of anesthesia    EBV infection 2013   Patient notes that he had an EBV infection in 2013 that left him with chronic fatigue. She also notes that he had significant lymphadenopathy and thyroiditis at the time. He has been managing his symptoms with an alternative medicine practitioner in [place] and with other alternative medicines.   Marijuana use, episodic    SBO (small bowel obstruction) (HCC)     SURGICAL HISTORY: Past Surgical History:  Procedure Laterality Date   APPENDECTOMY     LAPAROSCOPY N/A 07/20/2015   Procedure: LAPAROSCOPY DIAGNOSTIC, MULTIPLE BIOPSIES, DRAINAGE OF ASCITES;  Surgeon: Elon Pacini, MD;  Location: [facility];  Service: General;  Laterality: N/A;   TONSILLECTOMY      SOCIAL HISTORY: Social  History   Socioeconomic History   Marital status: Married    Spouse name: Not on file   Number of children: Not on file   Years of education: Not on file   Highest education  level: Not on file  Occupational History   Not on file  Tobacco Use   Smoking status: Never   Smokeless tobacco: Never  Vaping Use   Vaping status: Never Used  Substance and Sexual Activity   Alcohol use: Yes   Drug use: Not Currently    Frequency: 1.0 times per week    Types: Marijuana   Sexual activity: Yes  Other Topics Concern   Not on file  Social History Narrative   ** Merged History Encounter **       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 He has a fiance and has been in a steady relationship.  He has tried to maintain a very healthy lifestyle and cycles about 100 miles a week. He also uses    a fair number of over-the-counter alternative medicines to stay healthy.   Social Drivers of Corporate investment banker Strain: Not on file  Food Insecurity: Not on file  Transportation Needs: Not on file  Physical Activity: Not on file  Stress: Not on file  Social Connections: Unknown (03/24/2022)   Received from [facility]   Social Network    Social Network: Not on file  Intimate Partner Violence: Unknown (02/13/2022)   Received from [facility]   HITS    Physically Hurt: Not on file    Insult or Talk Down To: Not on file    Threaten Physical Harm: Not on file    Scream or Curse: Not on file    FAMILY HISTORY: Family History  Problem Relation Age of Onset   Heart failure Father     ALLERGIES:  is allergic to levaquin  [levofloxacin ] and sulfa antibiotics.  MEDICATIONS:  Current Outpatient Medications  Medication Sig Dispense Refill   HYDROcodone -acetaminophen  (NORCO/VICODIN) 5-325 MG tablet Take 2 tablets by mouth every 4 (four) hours as needed. 10 tablet 0   lidocaine  (LIDODERM ) 5 % Place 1 patch onto the skin daily. Remove & Discard patch within 12 hours or as directed by MD 30 patch 0   predniSONE  (DELTASONE ) 20 MG tablet 3 tabs po day one, then 2 po daily x 4 days 11 tablet 0   No current  facility-administered medications for this visit.   Facility-Administered Medications Ordered in Other Visits  Medication Dose Route Frequency Provider Last Rate Last Admin   heparin  lock flush 100 unit/mL  500 Units Intravenous Once Thiago Ragsdale Kishore, MD       sodium chloride  flush (NS) 0.9 % injection 10 mL  10 mL Intravenous Once Gloria Ricardo Kishore, MD        REVIEW OF SYSTEMS:    10 Point review of Systems was done is negative except as noted above.   PHYSICAL EXAMINATION: ECOG PERFORMANCE STATUS: 0 - Asymptomatic BP 125/79   Pulse 83   Temp 97.7 F (36.5 C)   Resp 20   Wt 216 lb 9.6 oz (98.2 kg)   SpO2 99%   BMI 29.38 kg/m   Wt Readings from Last 3 Encounters:  08/23/24 216 lb 9.6 oz (98.2 kg)  08/18/23 216 lb 6.4 oz (98.2 kg)  08/12/22 217 lb 6.4 oz (98.6 kg)  body mass index is 29.38 kg/m.  GENERAL:alert, in no  acute distress and comfortable SKIN: no acute rashes, no significant lesions EYES: conjunctiva are pink and non-injected, sclera anicteric OROPHARYNX: MMM, no exudates, no oropharyngeal erythema or ulceration NECK: supple, no JVD LYMPH:  no palpable lymphadenopathy in the cervical, axillary or inguinal regions LUNGS: clear to auscultation b/l with normal respiratory effort HEART: regular rate & rhythm ABDOMEN:  normoactive bowel sounds , non tender, not distended.  No palpable hepatosplenomegaly Extremity: no pedal edema PSYCH: alert & oriented x 3 with fluent speech NEURO: no focal motor/sensory deficits   LABORATORY DATA:  I have reviewed the data as listed     Latest Ref Rng & Units 08/23/2024    9:39 AM 08/18/2023    8:56 AM 08/12/2022    9:40 AM  CBC EXTENDED  WBC 4.0 - 10.5 K/uL 5.6  5.7  6.6   RBC 4.22 - 5.81 MIL/uL 4.98  4.88  4.92   Hemoglobin 13.0 - 17.0 g/dL 85.1  85.4  85.3   HCT 39.0 - 52.0 % 42.5  43.6  43.1   Platelets 150 - 400 K/uL 185  189  206   NEUT# 1.7 - 7.7 K/uL 3.1  2.9  3.7   Lymph# 0.7 - 4.0 K/uL 1.8  2.0  2.1     LDH Lab Results  Component Value Date   LDH 162 08/18/2023      Latest Ref Rng & Units 08/23/2024    9:39 AM 08/18/2023    8:56 AM 08/12/2022    9:40 AM  CMP  Glucose 70 - 99 mg/dL 94  891  899   BUN 6 - 20 mg/dL 23  20  17    Creatinine 0.61 - 1.24 mg/dL 9.06  9.00  8.92   Sodium 135 - 145 mmol/L 137  142  141   Potassium 3.5 - 5.1 mmol/L 4.1  4.2  4.3   Chloride 98 - 111 mmol/L 105  105  107   CO2 22 - 32 mmol/L 28  33  31   Calcium 8.9 - 10.3 mg/dL 9.2  8.8  8.7   Total Protein 6.5 - 8.1 g/dL 6.5  6.3  6.2   Total Bilirubin 0.0 - 1.2 mg/dL 0.5  0.5  0.3   Alkaline Phos 38 - 126 U/L 55  56  60   AST 15 - 41 U/L 29  40  22   ALT 0 - 44 U/L 36  34  25     RADIOGRAPHIC STUDIES: No results found.   ASSESSMENT & PLAN:   Patient is a 48 yo male with  1) High risk Burkitt's lymphoma stage III with no CNS involvement -currently in remission.  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 Patient is status post 6 cycles of EPOCH-R and received 4 doses of intrathecal methotrexate  for CNS prophylaxis. That PET CT scan performed 01/07/2016 after completion of planned treatment showed no evidence of residual hypermetabolic disease.  2) Anginal chest pain 1 episodes ACS ruled out in ED. ? SVT vs other arrhythmia vs prinzmetal angina vs atypical CP  3) no Recurrent  Symptoms suggestive of small bowel obstruction since his last clinic visit. Previously has had a few episodes of SBO likely due to adhesions which resolved with conservative management. Has declined surgical intervention and several previous discussions. No new symptoms regarding this since his last visit.  PLAN:  - Discussed lab results on 08/23/2024 in detail with patient: CBC stable with  WBC of 5.6K, Hemoglobin of 14.8, and PLTs of 185K. CMP is within normal limits - Reviewed CMP independently with Glucose normal at 94, decreased form 108  last year.   LDH is within normal limits at 154  - No lab or clinical evidence of Burkitt's lymphoma recurrence at this time.  - No recurrent symptoms of bowel obstruction - Continue 2000 units of Vitamin D. - Continue staying regularly hydrated - Pt does not have a PCP at this time, so we will continue to seeing him annually, but have recommended he find one for routine health maintenance and to set up age-appropriate cancer screening. Patient is not keen on getting vaccinations and has reservations with several vaccinations.  FOLLOW UP: RTC with Dr Onesimo with labs in 12 months  The total time spent in the appointment was 20 minutes* .  All of the patient's questions were answered with apparent satisfaction. The patient knows to call the clinic with any problems, questions or concerns.   Emaline Onesimo MD MS AAHIVMS [facility] Hematology/Oncology Physician [facility]  .*Total Encounter Time as defined by the Centers for Medicare and Medicaid Services includes, in addition to the face-to-face time of a patient visit (documented in the note above) non-face-to-face time: obtaining and reviewing outside history, ordering and reviewing medications, tests or procedures, care coordination (communications with other health care professionals or caregivers) and documentation in the medical record.    I, Damien Blanks, acting as a Neurosurgeon for Emaline Onesimo, MD.,have documented all relevant documentation on the behalf of Emaline Onesimo, MD,as directed by  Emaline Onesimo, MD while in the presence of Emaline Onesimo, MD.  I have reviewed the above documentation for accuracy and completeness, and I agree with the above. Emaline Candida Onesimo MD

## 2025-08-29 ENCOUNTER — Inpatient Hospital Stay: Admitting: Hematology

## 2025-08-29 ENCOUNTER — Inpatient Hospital Stay
